# Patient Record
Sex: Female | Born: 1935 | Race: White | Hispanic: No | Smoking: Never smoker
Health system: Southern US, Community
[De-identification: ages and names within clinical notes are randomized; demographics above are authoritative.]

## PROBLEM LIST (undated history)

## (undated) DIAGNOSIS — M419 Scoliosis, unspecified: Secondary | ICD-10-CM

## (undated) DIAGNOSIS — E039 Hypothyroidism, unspecified: Secondary | ICD-10-CM

## (undated) DIAGNOSIS — J449 Chronic obstructive pulmonary disease, unspecified: Secondary | ICD-10-CM

## (undated) DIAGNOSIS — U071 COVID-19: Secondary | ICD-10-CM

## (undated) DIAGNOSIS — M810 Age-related osteoporosis without current pathological fracture: Secondary | ICD-10-CM

## (undated) DIAGNOSIS — K219 Gastro-esophageal reflux disease without esophagitis: Secondary | ICD-10-CM

## (undated) DIAGNOSIS — I4891 Unspecified atrial fibrillation: Secondary | ICD-10-CM

## (undated) DIAGNOSIS — I10 Essential (primary) hypertension: Secondary | ICD-10-CM

## (undated) DIAGNOSIS — H9190 Unspecified hearing loss, unspecified ear: Secondary | ICD-10-CM

## (undated) DIAGNOSIS — F419 Anxiety disorder, unspecified: Secondary | ICD-10-CM

## (undated) DIAGNOSIS — M1990 Unspecified osteoarthritis, unspecified site: Secondary | ICD-10-CM

## (undated) DIAGNOSIS — E079 Disorder of thyroid, unspecified: Secondary | ICD-10-CM

## (undated) DIAGNOSIS — E7800 Pure hypercholesterolemia, unspecified: Secondary | ICD-10-CM

## (undated) DIAGNOSIS — F0390 Unspecified dementia without behavioral disturbance: Secondary | ICD-10-CM

## (undated) HISTORY — DX: Hypothyroidism, unspecified: E03.9

## (undated) HISTORY — DX: COVID-19: U07.1

## (undated) HISTORY — DX: Unspecified atrial fibrillation: I48.91

## (undated) HISTORY — DX: Anxiety disorder, unspecified: F41.9

## (undated) HISTORY — PX: CATARACT EXTRACTION: SUR2

## (undated) HISTORY — DX: Scoliosis, unspecified: M41.9

## (undated) HISTORY — PX: OTHER SURGICAL HISTORY: SHX169

## (undated) HISTORY — DX: Unspecified osteoarthritis, unspecified site: M19.90

## (undated) HISTORY — PX: THYROIDECTOMY: SHX17

## (undated) HISTORY — DX: Disorder of thyroid, unspecified: E07.9

## (undated) HISTORY — DX: Unspecified hearing loss, unspecified ear: H91.90

---

## 1998-01-15 ENCOUNTER — Ambulatory Visit (HOSPITAL_COMMUNITY): Admission: RE | Admit: 1998-01-15 | Discharge: 1998-01-15 | Payer: Self-pay | Admitting: Family Medicine

## 1998-12-19 ENCOUNTER — Other Ambulatory Visit: Admission: RE | Admit: 1998-12-19 | Discharge: 1998-12-19 | Payer: Self-pay | Admitting: *Deleted

## 2000-01-21 ENCOUNTER — Encounter: Admission: RE | Admit: 2000-01-21 | Discharge: 2000-01-21 | Payer: Self-pay | Admitting: *Deleted

## 2000-01-21 ENCOUNTER — Encounter: Payer: Self-pay | Admitting: *Deleted

## 2000-02-04 ENCOUNTER — Encounter: Admission: RE | Admit: 2000-02-04 | Discharge: 2000-02-04 | Payer: Self-pay | Admitting: *Deleted

## 2000-02-04 ENCOUNTER — Encounter: Payer: Self-pay | Admitting: *Deleted

## 2000-04-27 ENCOUNTER — Encounter: Payer: Self-pay | Admitting: Emergency Medicine

## 2000-04-27 ENCOUNTER — Emergency Department (HOSPITAL_COMMUNITY): Admission: EM | Admit: 2000-04-27 | Discharge: 2000-04-27 | Payer: Self-pay | Admitting: Emergency Medicine

## 2000-10-19 ENCOUNTER — Encounter: Payer: Self-pay | Admitting: *Deleted

## 2000-10-19 ENCOUNTER — Ambulatory Visit (HOSPITAL_COMMUNITY): Admission: RE | Admit: 2000-10-19 | Discharge: 2000-10-19 | Payer: Self-pay | Admitting: *Deleted

## 2000-12-30 ENCOUNTER — Encounter: Admission: RE | Admit: 2000-12-30 | Discharge: 2000-12-30 | Payer: Self-pay | Admitting: *Deleted

## 2000-12-30 ENCOUNTER — Encounter: Payer: Self-pay | Admitting: *Deleted

## 2001-01-21 ENCOUNTER — Encounter: Admission: RE | Admit: 2001-01-21 | Discharge: 2001-01-21 | Payer: Self-pay | Admitting: *Deleted

## 2001-01-21 ENCOUNTER — Encounter: Payer: Self-pay | Admitting: *Deleted

## 2001-03-31 ENCOUNTER — Encounter: Payer: Self-pay | Admitting: *Deleted

## 2001-03-31 ENCOUNTER — Encounter: Admission: RE | Admit: 2001-03-31 | Discharge: 2001-03-31 | Payer: Self-pay | Admitting: *Deleted

## 2002-01-25 ENCOUNTER — Encounter: Admission: RE | Admit: 2002-01-25 | Discharge: 2002-01-25 | Payer: Self-pay | Admitting: Family Medicine

## 2002-01-25 ENCOUNTER — Encounter: Payer: Self-pay | Admitting: Family Medicine

## 2002-06-08 ENCOUNTER — Encounter: Payer: Self-pay | Admitting: Specialist

## 2002-06-13 ENCOUNTER — Ambulatory Visit (HOSPITAL_COMMUNITY): Admission: RE | Admit: 2002-06-13 | Discharge: 2002-06-13 | Payer: Self-pay | Admitting: Specialist

## 2002-07-06 ENCOUNTER — Ambulatory Visit (HOSPITAL_COMMUNITY): Admission: RE | Admit: 2002-07-06 | Discharge: 2002-07-06 | Payer: Self-pay | Admitting: Specialist

## 2002-10-31 ENCOUNTER — Encounter: Payer: Self-pay | Admitting: Family Medicine

## 2002-10-31 ENCOUNTER — Encounter: Admission: RE | Admit: 2002-10-31 | Discharge: 2002-10-31 | Payer: Self-pay | Admitting: Family Medicine

## 2003-07-18 ENCOUNTER — Ambulatory Visit (HOSPITAL_COMMUNITY): Admission: RE | Admit: 2003-07-18 | Discharge: 2003-07-18 | Payer: Self-pay | Admitting: Family Medicine

## 2003-07-24 ENCOUNTER — Encounter: Admission: RE | Admit: 2003-07-24 | Discharge: 2003-07-24 | Payer: Self-pay | Admitting: Family Medicine

## 2003-11-03 ENCOUNTER — Encounter: Admission: RE | Admit: 2003-11-03 | Discharge: 2003-11-03 | Payer: Self-pay | Admitting: Family Medicine

## 2004-11-08 ENCOUNTER — Encounter: Admission: RE | Admit: 2004-11-08 | Discharge: 2004-11-08 | Payer: Self-pay | Admitting: Family Medicine

## 2005-11-10 ENCOUNTER — Encounter: Admission: RE | Admit: 2005-11-10 | Discharge: 2005-11-10 | Payer: Self-pay | Admitting: Family Medicine

## 2006-11-11 ENCOUNTER — Encounter: Admission: RE | Admit: 2006-11-11 | Discharge: 2006-11-11 | Payer: Self-pay | Admitting: Family Medicine

## 2007-10-26 ENCOUNTER — Encounter: Payer: Self-pay | Admitting: Critical Care Medicine

## 2007-10-27 ENCOUNTER — Encounter: Payer: Self-pay | Admitting: Critical Care Medicine

## 2007-10-29 ENCOUNTER — Encounter: Payer: Self-pay | Admitting: Critical Care Medicine

## 2007-11-15 ENCOUNTER — Ambulatory Visit: Payer: Self-pay | Admitting: Cardiology

## 2007-11-15 ENCOUNTER — Ambulatory Visit: Payer: Self-pay | Admitting: Critical Care Medicine

## 2007-11-15 DIAGNOSIS — I10 Essential (primary) hypertension: Secondary | ICD-10-CM | POA: Insufficient documentation

## 2007-11-15 DIAGNOSIS — E785 Hyperlipidemia, unspecified: Secondary | ICD-10-CM

## 2007-11-15 LAB — CONVERTED CEMR LAB
Calcium: 9 mg/dL (ref 8.4–10.5)
Chloride: 95 meq/L — ABNORMAL LOW (ref 96–112)
Creatinine, Ser: 1.1 mg/dL (ref 0.4–1.2)
Potassium: 5.1 meq/L (ref 3.5–5.1)

## 2007-11-17 ENCOUNTER — Encounter: Payer: Self-pay | Admitting: Critical Care Medicine

## 2007-11-18 ENCOUNTER — Telehealth (INDEPENDENT_AMBULATORY_CARE_PROVIDER_SITE_OTHER): Payer: Self-pay | Admitting: *Deleted

## 2007-11-19 ENCOUNTER — Ambulatory Visit: Payer: Self-pay | Admitting: Critical Care Medicine

## 2007-11-22 ENCOUNTER — Encounter: Payer: Self-pay | Admitting: Critical Care Medicine

## 2007-11-24 ENCOUNTER — Ambulatory Visit: Payer: Self-pay | Admitting: Critical Care Medicine

## 2007-11-24 ENCOUNTER — Encounter: Payer: Self-pay | Admitting: Critical Care Medicine

## 2007-11-24 ENCOUNTER — Ambulatory Visit: Admission: RE | Admit: 2007-11-24 | Discharge: 2007-11-24 | Payer: Self-pay | Admitting: Critical Care Medicine

## 2007-11-26 ENCOUNTER — Encounter: Payer: Self-pay | Admitting: Critical Care Medicine

## 2007-11-26 ENCOUNTER — Telehealth (INDEPENDENT_AMBULATORY_CARE_PROVIDER_SITE_OTHER): Payer: Self-pay | Admitting: *Deleted

## 2007-11-28 ENCOUNTER — Encounter: Payer: Self-pay | Admitting: Critical Care Medicine

## 2007-12-06 ENCOUNTER — Ambulatory Visit: Payer: Self-pay | Admitting: Critical Care Medicine

## 2007-12-06 DIAGNOSIS — K219 Gastro-esophageal reflux disease without esophagitis: Secondary | ICD-10-CM | POA: Insufficient documentation

## 2007-12-08 ENCOUNTER — Encounter: Admission: RE | Admit: 2007-12-08 | Discharge: 2007-12-08 | Payer: Self-pay | Admitting: Family Medicine

## 2007-12-09 ENCOUNTER — Telehealth (INDEPENDENT_AMBULATORY_CARE_PROVIDER_SITE_OTHER): Payer: Self-pay | Admitting: *Deleted

## 2007-12-14 ENCOUNTER — Telehealth (INDEPENDENT_AMBULATORY_CARE_PROVIDER_SITE_OTHER): Payer: Self-pay | Admitting: *Deleted

## 2008-03-24 ENCOUNTER — Telehealth: Payer: Self-pay | Admitting: Pulmonary Disease

## 2008-05-03 ENCOUNTER — Telehealth (INDEPENDENT_AMBULATORY_CARE_PROVIDER_SITE_OTHER): Payer: Self-pay | Admitting: *Deleted

## 2008-05-03 ENCOUNTER — Ambulatory Visit: Payer: Self-pay | Admitting: Critical Care Medicine

## 2008-05-26 ENCOUNTER — Encounter: Admission: RE | Admit: 2008-05-26 | Discharge: 2008-05-26 | Payer: Self-pay | Admitting: Family Medicine

## 2008-07-18 ENCOUNTER — Ambulatory Visit (HOSPITAL_COMMUNITY): Admission: RE | Admit: 2008-07-18 | Discharge: 2008-07-18 | Payer: Self-pay | Admitting: Family Medicine

## 2008-12-08 ENCOUNTER — Encounter: Admission: RE | Admit: 2008-12-08 | Discharge: 2008-12-08 | Payer: Self-pay | Admitting: Family Medicine

## 2009-01-02 ENCOUNTER — Ambulatory Visit: Payer: Self-pay | Admitting: Critical Care Medicine

## 2009-05-07 ENCOUNTER — Ambulatory Visit: Payer: Self-pay | Admitting: Critical Care Medicine

## 2009-08-01 ENCOUNTER — Telehealth (INDEPENDENT_AMBULATORY_CARE_PROVIDER_SITE_OTHER): Payer: Self-pay | Admitting: *Deleted

## 2009-08-13 ENCOUNTER — Ambulatory Visit (HOSPITAL_COMMUNITY): Admission: RE | Admit: 2009-08-13 | Discharge: 2009-08-13 | Payer: Self-pay | Admitting: Family Medicine

## 2009-08-24 ENCOUNTER — Ambulatory Visit: Payer: Self-pay | Admitting: Critical Care Medicine

## 2009-12-10 ENCOUNTER — Encounter: Admission: RE | Admit: 2009-12-10 | Discharge: 2009-12-10 | Payer: Self-pay | Admitting: Family Medicine

## 2009-12-17 ENCOUNTER — Encounter: Admission: RE | Admit: 2009-12-17 | Discharge: 2009-12-17 | Payer: Self-pay | Admitting: Family Medicine

## 2010-08-06 ENCOUNTER — Encounter: Admission: RE | Admit: 2010-08-06 | Discharge: 2010-08-06 | Payer: Self-pay | Admitting: Family Medicine

## 2010-08-30 ENCOUNTER — Ambulatory Visit (HOSPITAL_COMMUNITY)
Admission: RE | Admit: 2010-08-30 | Discharge: 2010-08-30 | Payer: Self-pay | Source: Home / Self Care | Attending: Family Medicine | Admitting: Family Medicine

## 2010-10-13 ENCOUNTER — Encounter: Payer: Self-pay | Admitting: Family Medicine

## 2010-11-12 ENCOUNTER — Other Ambulatory Visit: Payer: Self-pay | Admitting: Family Medicine

## 2010-11-12 DIAGNOSIS — Z1231 Encounter for screening mammogram for malignant neoplasm of breast: Secondary | ICD-10-CM

## 2010-12-12 ENCOUNTER — Ambulatory Visit
Admission: RE | Admit: 2010-12-12 | Discharge: 2010-12-12 | Disposition: A | Discharge status: Home / Self Care | Payer: Medicare Other | Source: Ambulatory Visit | Attending: Family Medicine | Admitting: Family Medicine

## 2010-12-12 DIAGNOSIS — Z1231 Encounter for screening mammogram for malignant neoplasm of breast: Secondary | ICD-10-CM

## 2011-02-04 NOTE — Op Note (Signed)
NAMECHRISA, Erin Ryan               ACCOUNT NO.:  [id number]   MEDICAL RECORD NO.:  [id number]          PATIENT TYPE:  AMB   LOCATION:  CARD                         FACILITY:  [facility]   PHYSICIAN:  Charlcie Cradle. Delford Field, MD, FCCPDATE OF BIRTH:  12-15-26   DATE OF PROCEDURE:  11/24/2007  DATE OF DISCHARGE:                               OPERATIVE REPORT   CHIEF COMPLAINT:  Dyspnea with interstitial disease on CT scanning,  evaluate for cause.   SURGEON:  Charlcie Cradle. Delford Field, MD, FCCP.   ANESTHESIA:  1% Xylocaine local.   PREMEDICATION:  Fentanyl 20 mcg, Versed 4 mg IV push.   PROCEDURE:  The Pentax video bronchoscope was introduced via the right  nares.  The upper airways were visualized and showed significant airway  edema with persistent false vocal cord edema and arachnoid swelling  compatible with reflux induced inflammation.  Attention was then paid to  the lower airway.  This showed mild tracheobronchitis, no endobronchial  lesions were seen.  Attention was then paid to the right upper lobe  orifice.  Transbronchial biopsies were obtained.  Bronchial washings  were obtained from the apical and anterior segments.   COMPLICATIONS:  None.   IMPRESSION:  Interstitial disease compatible with nonspecific  interstitial pneumonitis, reflux induced upper airway dysfunction and  obstruction.   RECOMMENDATIONS:  Treat reflux disease more aggressively, consider  systemic corticosteroids for airway disease, follow up microbiologic  data, obtain ANA rheumatoid factor.      Charlcie Cradle Delford Field, MD, [facility]  Electronically Signed     PEW/MEDQ  D:  11/24/2007  T:  11/24/2007  Job:  [id number]   cc:   Stacie Acres. Cliffton Asters, M.D.  Fax: [phone]

## 2011-06-16 LAB — FUNGUS CULTURE W SMEAR

## 2011-06-16 LAB — CULTURE, RESPIRATORY W GRAM STAIN

## 2011-06-16 LAB — AFB CULTURE WITH SMEAR (NOT AT ARMC)

## 2011-06-16 LAB — ANA: Anti Nuclear Antibody(ANA): NEGATIVE

## 2011-06-24 ENCOUNTER — Other Ambulatory Visit: Payer: Self-pay | Admitting: Gastroenterology

## 2011-09-03 ENCOUNTER — Other Ambulatory Visit: Payer: Self-pay | Admitting: Family Medicine

## 2011-09-04 ENCOUNTER — Other Ambulatory Visit (HOSPITAL_COMMUNITY): Payer: Self-pay | Admitting: *Deleted

## 2011-09-05 ENCOUNTER — Other Ambulatory Visit (HOSPITAL_COMMUNITY): Payer: Self-pay | Admitting: *Deleted

## 2011-09-08 ENCOUNTER — Encounter (HOSPITAL_COMMUNITY): Payer: Self-pay

## 2011-09-08 ENCOUNTER — Encounter (HOSPITAL_COMMUNITY)
Admission: RE | Admit: 2011-09-08 | Discharge: 2011-09-08 | Disposition: A | Discharge status: Home / Self Care | Payer: Medicare Other | Source: Ambulatory Visit | Attending: Family Medicine | Admitting: Family Medicine

## 2011-09-08 DIAGNOSIS — M810 Age-related osteoporosis without current pathological fracture: Secondary | ICD-10-CM | POA: Insufficient documentation

## 2011-09-08 HISTORY — DX: Gastro-esophageal reflux disease without esophagitis: K21.9

## 2011-09-08 HISTORY — DX: Pure hypercholesterolemia, unspecified: E78.00

## 2011-09-08 HISTORY — DX: Essential (primary) hypertension: I10

## 2011-09-08 MED ORDER — SODIUM CHLORIDE 0.9 % IV SOLN
INTRAVENOUS | Status: DC
  Administered 2011-09-08: 14:00:00 via INTRAVENOUS

## 2011-09-08 MED ORDER — ZOLEDRONIC ACID 5 MG/100ML IV SOLN
5.0000 mg | INTRAVENOUS | Status: AC
  Administered 2011-09-08: 5 mg via INTRAVENOUS
  Filled 2011-09-08: qty 100

## 2011-09-25 DIAGNOSIS — N6459 Other signs and symptoms in breast: Secondary | ICD-10-CM | POA: Diagnosis not present

## 2011-09-25 DIAGNOSIS — N644 Mastodynia: Secondary | ICD-10-CM | POA: Diagnosis not present

## 2011-09-26 ENCOUNTER — Other Ambulatory Visit: Payer: Self-pay | Admitting: Family Medicine

## 2011-09-26 DIAGNOSIS — N644 Mastodynia: Secondary | ICD-10-CM

## 2011-09-26 DIAGNOSIS — N6459 Other signs and symptoms in breast: Secondary | ICD-10-CM

## 2011-10-02 ENCOUNTER — Ambulatory Visit
Admission: RE | Admit: 2011-10-02 | Discharge: 2011-10-02 | Disposition: A | Discharge status: Home / Self Care | Payer: Medicare Other | Source: Ambulatory Visit | Attending: Family Medicine | Admitting: Family Medicine

## 2011-10-02 DIAGNOSIS — N6489 Other specified disorders of breast: Secondary | ICD-10-CM | POA: Diagnosis not present

## 2011-10-02 DIAGNOSIS — N6459 Other signs and symptoms in breast: Secondary | ICD-10-CM

## 2011-10-02 DIAGNOSIS — N644 Mastodynia: Secondary | ICD-10-CM

## 2011-10-17 DIAGNOSIS — L988 Other specified disorders of the skin and subcutaneous tissue: Secondary | ICD-10-CM | POA: Diagnosis not present

## 2011-10-21 ENCOUNTER — Other Ambulatory Visit: Payer: Self-pay | Admitting: Family Medicine

## 2011-10-21 DIAGNOSIS — Z1231 Encounter for screening mammogram for malignant neoplasm of breast: Secondary | ICD-10-CM

## 2011-10-22 DIAGNOSIS — L988 Other specified disorders of the skin and subcutaneous tissue: Secondary | ICD-10-CM | POA: Diagnosis not present

## 2011-10-22 DIAGNOSIS — L821 Other seborrheic keratosis: Secondary | ICD-10-CM | POA: Diagnosis not present

## 2011-11-04 DIAGNOSIS — R0602 Shortness of breath: Secondary | ICD-10-CM | POA: Diagnosis not present

## 2011-11-04 DIAGNOSIS — E785 Hyperlipidemia, unspecified: Secondary | ICD-10-CM | POA: Diagnosis not present

## 2011-11-04 DIAGNOSIS — Z1331 Encounter for screening for depression: Secondary | ICD-10-CM | POA: Diagnosis not present

## 2011-11-04 DIAGNOSIS — E039 Hypothyroidism, unspecified: Secondary | ICD-10-CM | POA: Diagnosis not present

## 2011-11-04 DIAGNOSIS — K219 Gastro-esophageal reflux disease without esophagitis: Secondary | ICD-10-CM | POA: Diagnosis not present

## 2011-11-04 DIAGNOSIS — I129 Hypertensive chronic kidney disease with stage 1 through stage 4 chronic kidney disease, or unspecified chronic kidney disease: Secondary | ICD-10-CM | POA: Diagnosis not present

## 2011-11-04 DIAGNOSIS — F411 Generalized anxiety disorder: Secondary | ICD-10-CM | POA: Diagnosis not present

## 2011-11-17 DIAGNOSIS — H9319 Tinnitus, unspecified ear: Secondary | ICD-10-CM | POA: Diagnosis not present

## 2011-11-17 DIAGNOSIS — H903 Sensorineural hearing loss, bilateral: Secondary | ICD-10-CM | POA: Diagnosis not present

## 2011-12-19 ENCOUNTER — Ambulatory Visit
Admission: RE | Admit: 2011-12-19 | Discharge: 2011-12-19 | Disposition: A | Discharge status: Home / Self Care | Payer: Medicare Other | Source: Ambulatory Visit | Attending: Family Medicine | Admitting: Family Medicine

## 2011-12-19 ENCOUNTER — Other Ambulatory Visit: Payer: Self-pay | Admitting: Family Medicine

## 2011-12-19 DIAGNOSIS — Z1231 Encounter for screening mammogram for malignant neoplasm of breast: Secondary | ICD-10-CM

## 2011-12-19 DIAGNOSIS — N6459 Other signs and symptoms in breast: Secondary | ICD-10-CM

## 2011-12-30 ENCOUNTER — Ambulatory Visit
Admission: RE | Admit: 2011-12-30 | Discharge: 2011-12-30 | Disposition: A | Discharge status: Home / Self Care | Payer: Medicare Other | Source: Ambulatory Visit | Attending: Family Medicine | Admitting: Family Medicine

## 2011-12-30 DIAGNOSIS — N6489 Other specified disorders of breast: Secondary | ICD-10-CM | POA: Diagnosis not present

## 2011-12-30 DIAGNOSIS — N6459 Other signs and symptoms in breast: Secondary | ICD-10-CM

## 2012-01-02 ENCOUNTER — Ambulatory Visit (INDEPENDENT_AMBULATORY_CARE_PROVIDER_SITE_OTHER): Payer: Medicare Other | Admitting: Surgery

## 2012-01-02 ENCOUNTER — Encounter (INDEPENDENT_AMBULATORY_CARE_PROVIDER_SITE_OTHER): Payer: Self-pay | Admitting: Surgery

## 2012-01-02 VITALS — BP 164/72 | HR 68 | Temp 97.4°F | Ht 62.5 in | Wt <= 1120 oz

## 2012-01-02 DIAGNOSIS — N61 Mastitis without abscess: Secondary | ICD-10-CM | POA: Diagnosis not present

## 2012-01-02 DIAGNOSIS — N6459 Other signs and symptoms in breast: Secondary | ICD-10-CM

## 2012-01-02 NOTE — Progress Notes (Signed)
Re:   Erin Ryan DOB:   [birth year] MRN:   [id number]  Urgent Office  ASSESSMENT AND PLAN: 1.  Left nipple skin cyst/blister - 2 days - unclear reason.  2.  Dr. Chilton Si has raised the questions of Paget's disease, though my exam is unimpressive.  (photo taken)  I will see back in 3 to 4 weeks when the blister has healed to re-evaluate nipple/breast.  Need to set her up in one of the minor surgery rooms for possible punch biopsy.  3.  Hypertension 4.  GERD  Saw Dr. Jonni Sanger - who described pulmonary fibrosis 4.  Arthritis  Chief Complaint  Patient presents with  . Follow-up    eval Lt nipple punch bx   REFERRING PHYSICIAN: Cala Bradford, MD, MD  HISTORY OF PRESENT ILLNESS: Erin Ryan is a 78 y.o. (DOB: [birth year])  white female whose primary care physician is Cala Bradford, MD, MD and comes to me today for left nipple cyst/blister.  The patient has had prior bilateral breast biopsies by Dr. Rebekah Chesterfield and Dr. Verne Carrow many years ago. There is a circumareolar incision around the left nipple.  She noticed some changes of her left nipple about 3 months ago. She brought this to the attention of Dr. Charlott Rakes. She obtained a mammogram of the left breast 02 October 2011 which was negative.  She came back Monday, 30 December 2011, for bilateral mammograms. They insisted on doing diagnostic mammograms. The change in the left nipple was not much different according to the patient. However, 2 days post mammogram she developed a blister on her left nipple. This prompted her to be worked in to the urgent office.  She has no family history of breast cancer.    Past Medical History  Diagnosis Date  . Osteoporosis   . Hypertension   . High cholesterol   . GERD (gastroesophageal reflux disease)   . Arthritis   . Thyroid disease       Past Surgical History  Procedure Date  . Thyroidectomy   . Cysts removed from bilateral breasts       Current Outpatient Prescriptions    Medication Sig Dispense Refill  . amLODipine (NORVASC) 5 MG tablet Take 5 mg by mouth daily.        Marland Kitchen atorvastatin (LIPITOR) 40 MG tablet Take 40 mg by mouth daily.        . calcium carbonate (OS-CAL - DOSED IN MG OF ELEMENTAL CALCIUM) 1250 MG tablet Take 1 tablet by mouth daily.        Marland Kitchen levothyroxine (SYNTHROID, LEVOTHROID) 75 MCG tablet Take 75 mcg by mouth daily.        Marland Kitchen olmesartan (BENICAR) 40 MG tablet Take 40 mg by mouth daily.        Marland Kitchen omeprazole (PRILOSEC) 20 MG capsule Take 20 mg by mouth daily.        Marland Kitchen PARoxetine (PAXIL) 10 MG tablet Take 10 mg by mouth every morning.        . Zoledronic Acid (RECLAST IV) Inject into the vein once.         No Known Allergies  REVIEW OF SYSTEMS: Skin:  No history of rash.  No history of abnormal moles. Infection:  No history of hepatitis or HIV.  No history of MRSA. Neurologic:  No history of stroke.  No history of seizure.  No history of headaches. Cardiac:  Hypertension Pulmonary: Saw Dr. Delford Field.  ?reflux induced pulmonary disease.  Endocrine:  No  diabetes. Prior thyroid surgery for benign disease. Gastrointestinal:  No history of stomach disease.  No history of liver disease.  No history of gall bladder disease.  No history of pancreas disease.  No history of colon disease. Urologic:  No history of kidney stones.  No history of bladder infections. Musculoskeletal:  No history of joint or back disease. Hematologic:  No bleeding disorder.  No history of anemia.  Not anticoagulated. Psycho-social:  The patient is oriented.   The patient has no obvious psychologic or social impairment to understanding our conversation and plan.  SOCIAL and FAMILY HISTORY: Daughter, [name], is with patient.  Only child. She just had a colectomy by Dr. Michaell Cowing.  PHYSICAL EXAM: BP 164/72  Pulse 68  Temp(Src) 97.4 F (36.3 C) (Temporal)  Ht 5' 2.5" (1.588 m)  Wt 11 lb 9.6 oz (5.262 kg)  BMI 2.09 kg/m2  SpO2 96%  General: WN older WF who is alert  and generally healthy appearing. Somewhat hard of hearing. HEENT: Normal. Pupils equal. Good dentition. Neck: Supple. No mass.  Thyroidectomy scar. Lymph Nodes:  No supraclavicular or cervical nodes. Lungs: Clear to auscultation and symmetric breath sounds. Breasts:  Left:  1.5 cm blister on nipple.  It is a little hard to evaluate the aerola.  There is no obvious Paget's disease.  [photo taken]  Right: Scars at 12 and 9 o'clock.  Heart:  RRR. No murmur or rub. Extremities:  Good strength and ROM  in upper and lower extremities. Neurologic:  Grossly intact to motor and sensory function. Psychiatric: Has normal mood and affect. Behavior is normal.   DATA REVIEWED: Mammogram reports.  Ovidio Kin, MD,  [facility] [address] [place]  [address]   [place], [place]    [address] Phone:  [phone] FAX:  [phone]

## 2012-01-06 DIAGNOSIS — L259 Unspecified contact dermatitis, unspecified cause: Secondary | ICD-10-CM | POA: Diagnosis not present

## 2012-01-20 ENCOUNTER — Ambulatory Visit (INDEPENDENT_AMBULATORY_CARE_PROVIDER_SITE_OTHER): Payer: Medicare Other | Admitting: Surgery

## 2012-01-29 ENCOUNTER — Ambulatory Visit (INDEPENDENT_AMBULATORY_CARE_PROVIDER_SITE_OTHER): Payer: Medicare Other

## 2012-01-29 ENCOUNTER — Ambulatory Visit (INDEPENDENT_AMBULATORY_CARE_PROVIDER_SITE_OTHER): Payer: Medicare Other | Admitting: Surgery

## 2012-01-29 DIAGNOSIS — N6459 Other signs and symptoms in breast: Secondary | ICD-10-CM

## 2012-01-29 NOTE — Progress Notes (Signed)
Re:   Erin Ryan DOB:   04-Jan-1967 MRN:   [id number]  Urgent Office  ASSESSMENT AND PLAN: 1.  Left nipple skin cyst/blister.  The nipple looks much better than last time.  The blister is gone, but there is some thickening.  I will see her back in one month to recheck the nipple.  She will be in room #11 if I need to do a biopsy.   2.  Dr. Chilton Ryan has raised the questions of Paget's disease, though my exam is unimpressive.  (photo taken)   3.  Hypertension 4.  GERD  Saw Dr. Jonni Ryan - who described pulmonary fibrosis 4.  Arthritis  REFERRING PHYSICIAN: Cala Bradford, MD, MD  HISTORY OF PRESENT ILLNESS: Erin Ryan is a 78 y.o. (DOB: 15-Mar-1925)  white female whose primary care physician is Erin Bradford, MD, MD and comes to me today for left nipple cyst/blister.  Her daughter is here with her.  The patient has had prior bilateral breast biopsies by Dr. Rebekah Ryan and Dr. Verne Ryan many years ago. There is a circumareolar incision around the left nipple.  She noticed some changes of her left nipple about 3 months ago. She brought this to the attention of Dr. Charlott Ryan. She obtained a mammogram of the left breast 02 October 2011 which was negative.  She came back Monday, 30 December 2011, for bilateral mammograms. They insisted on doing diagnostic mammograms. The change in the left nipple was not much different according to the patient. However, 2 days post mammogram she developed a blister on her left nipple. This prompted her to be worked in to the urgent office.  Since last saw her, the blister on the left nipple has healed.  She is having no pain or symptoms.  She has no family history of breast cancer.    Past Medical History  Diagnosis Date  . Osteoporosis   . Hypertension   . High cholesterol   . GERD (gastroesophageal reflux disease)   . Arthritis   . Thyroid disease      Current Outpatient Prescriptions  Medication Sig Dispense Refill  . amLODipine  (NORVASC) 5 MG tablet Take 5 mg by mouth daily.        Marland Kitchen atorvastatin (LIPITOR) 40 MG tablet Take 40 mg by mouth daily.        . calcium carbonate (OS-CAL - DOSED IN MG OF ELEMENTAL CALCIUM) 1250 MG tablet Take 1 tablet by mouth daily.        Marland Kitchen levothyroxine (SYNTHROID, LEVOTHROID) 75 MCG tablet Take 75 mcg by mouth daily.        Marland Kitchen olmesartan (BENICAR) 40 MG tablet Take 40 mg by mouth daily.        Marland Kitchen omeprazole (PRILOSEC) 20 MG capsule Take 20 mg by mouth daily.        Marland Kitchen PARoxetine (PAXIL) 10 MG tablet Take 10 mg by mouth every morning.        . Zoledronic Acid (RECLAST IV) Inject into the vein once.         No Known Allergies  REVIEW OF SYSTEMS: Cardiac:  Hypertension Pulmonary: Saw Dr. Delford Ryan.  ?reflux induced pulmonary disease. Endocrine:  No diabetes. Prior thyroid surgery for benign disease.  SOCIAL and FAMILY HISTORY: Daughter, Erin Ryan, is with patient.  Only child. She just had a colectomy by Dr. Michaell Ryan.  PHYSICAL EXAM: There were no vitals taken for this visit.  General: WN older WF who is alert and generally healthy  appearing. Somewhat hard of hearing.  Breasts:  Left:  Thickened skin around left nipple from 9 o'clock to 2 o'clock.  The left nipple is inverted, but she does not know how long it has been that way.  I feel no mass or nodule under the nipple.  I is clearly better than when I last saw her.  [photo taken]  Right: Scars at 12 and 9 o'clock.  DATA REVIEWED: Mammogram reports.  Erin Kin, MD,  [facility] [address]  [address]   [place], [place]    [address] Phone:  [phone] FAX:  [phone]

## 2012-02-12 DIAGNOSIS — N899 Noninflammatory disorder of vagina, unspecified: Secondary | ICD-10-CM | POA: Diagnosis not present

## 2012-02-12 DIAGNOSIS — N6459 Other signs and symptoms in breast: Secondary | ICD-10-CM | POA: Diagnosis not present

## 2012-02-12 DIAGNOSIS — R300 Dysuria: Secondary | ICD-10-CM | POA: Diagnosis not present

## 2012-02-25 ENCOUNTER — Ambulatory Visit (INDEPENDENT_AMBULATORY_CARE_PROVIDER_SITE_OTHER): Payer: Medicare Other | Admitting: Surgery

## 2012-02-25 ENCOUNTER — Ambulatory Visit (INDEPENDENT_AMBULATORY_CARE_PROVIDER_SITE_OTHER): Payer: Medicare Other

## 2012-02-25 VITALS — BP 140/80 | HR 78 | Temp 97.2°F | Resp 18 | Wt <= 1120 oz

## 2012-02-25 DIAGNOSIS — N6459 Other signs and symptoms in breast: Secondary | ICD-10-CM | POA: Diagnosis not present

## 2012-02-25 DIAGNOSIS — L089 Local infection of the skin and subcutaneous tissue, unspecified: Secondary | ICD-10-CM | POA: Diagnosis not present

## 2012-02-25 NOTE — Progress Notes (Signed)
Re:   Erin Ryan DOB:   Jan 26, 1925 MRN:   [id number]  Urgent Office Patient  ASSESSMENT AND PLAN: 1.  Left nipple skin cyst/blister.  I first saw her 01/02/2012 with a blister of the left nipple/areola.  I did a punch/open biopsy of the skin of the areola today.  We will plan suture removal in one week.   2.  Dr. Chilton Si has raised the questions of Paget's disease, though my exam is unimpressive.  (photo taken)   3.  Hypertension 4.  GERD  Saw Dr. Jonni Sanger - who described pulmonary fibrosis 4.  Arthritis  REFERRING PHYSICIAN: Cala Bradford, MD, MD  HISTORY OF PRESENT ILLNESS: Erin Ryan is a 78 y.o. (DOB: 1925/05/28)  white female whose primary care physician is Cala Bradford, MD, MD and comes to me today for follow up of left nipple cyst/blister.  Her daughter is not here with her, [name]: (W) [phone]) but I talked to her on the phone.  The patient has had prior bilateral breast biopsies by Dr. Rebekah Chesterfield and Dr. Verne Carrow many years ago. There is a circumareolar incision around the left nipple.  She noticed some changes of her left nipple about 3 months ago. She brought this to the attention of Dr. Charlott Rakes. She obtained a mammogram of the left breast 02 October 2011 which was negative.  She came back Monday, 30 December 2011, for bilateral mammograms. They insisted on doing diagnostic mammograms. The change in the left nipple was not much different according to the patient. However, 2 days post mammogram she developed a blister on her left nipple. This prompted her to be worked in to the urgent office.  It has now been about 8 weeks since I first saw her.  The area is not entirely normal, so I went ahead with a biopsy of the left areola today.  She is having no pain or symptoms.  She has no family history of breast cancer.    Past Medical History  Diagnosis Date  . Osteoporosis   . Hypertension   . High cholesterol   . GERD (gastroesophageal reflux  disease)   . Arthritis   . Thyroid disease      Current Outpatient Prescriptions  Medication Sig Dispense Refill  . amLODipine (NORVASC) 5 MG tablet Take 5 mg by mouth daily.        Marland Kitchen atorvastatin (LIPITOR) 40 MG tablet Take 40 mg by mouth daily.        . calcium carbonate (OS-CAL - DOSED IN MG OF ELEMENTAL CALCIUM) 1250 MG tablet Take 1 tablet by mouth daily.        Marland Kitchen levothyroxine (SYNTHROID, LEVOTHROID) 75 MCG tablet Take 75 mcg by mouth daily.        Marland Kitchen olmesartan (BENICAR) 40 MG tablet Take 40 mg by mouth daily.        Marland Kitchen omeprazole (PRILOSEC) 20 MG capsule Take 20 mg by mouth daily.        Marland Kitchen PARoxetine (PAXIL) 10 MG tablet Take 10 mg by mouth every morning.        . Zoledronic Acid (RECLAST IV) Inject into the vein once.         No Known Allergies  REVIEW OF SYSTEMS: Cardiac:  Hypertension Pulmonary: Saw Dr. Delford Field.  ?reflux induced pulmonary disease. Endocrine:  No diabetes. Prior thyroid surgery for benign disease.  SOCIAL and FAMILY HISTORY: Daughter, [name] (W: [phone]) (works as a Armed forces operational officer), is with patient.  Only child. She just had a colectomy by Dr. Michaell Cowing.  PHYSICAL EXAM: BP 140/80  Pulse 78  Temp(Src) 97.2 F (36.2 C) (Temporal)  Resp 18  Wt 11 lb 6 oz (5.16 kg)  General: WN older WF who is alert and generally healthy appearing. Somewhat hard of hearing.  Breasts:  Left:  Thickened skin around left nipple from 9 o'clock to 2 o'clock.  The left nipple is inverted.  I feel no mass or nodule under the nipple. It is about the same as last time, with maybe some redness around the areola.  [photo taken]  Right: Scars at 12 and 9 o'clock.  Procedure:  While in the office, I painted her left areola with betadine.  I used a 3 mm punch biopsy to get a biopsy of skin.  But the punch did not work quite like I wanted to, so excise a 5 mm area of skin.  This was sent to path.  DATA REVIEWED: Mammogram reports.  Ovidio Kin, MD,  [facility] [address] [place]  [address]   [place], [place]    [address] Phone:  [phone] FAX:  [phone]

## 2012-02-26 ENCOUNTER — Telehealth (INDEPENDENT_AMBULATORY_CARE_PROVIDER_SITE_OTHER): Payer: Self-pay

## 2012-02-26 NOTE — Telephone Encounter (Signed)
I called the patient again to check on her.  She states it is not oozing now.  She plans to keep a gauze in her bra.  She has already spoken to Dr Ezzard Standing and got her path results.  He told her not to bathe until tomorrow and to hold her Aspirin this week.

## 2012-02-26 NOTE — Telephone Encounter (Signed)
The patient called and complained of oozing from her biopsy site this am.  She had just taken the bandage off.  I told her to apply a gauze pressure dressing and hold it for about 30 minutes.  I will call her back to check on it.

## 2012-02-26 NOTE — Telephone Encounter (Signed)
I called back and the patient says there is still a little oozing.  She does not have any more gauze.  I told her to get some and put a good thick dressing on it and keep pressure.  I will check on her around 1 or 1:30 today to see if we need to check her today.  She is going out of town tomorrow.

## 2012-02-27 ENCOUNTER — Ambulatory Visit (INDEPENDENT_AMBULATORY_CARE_PROVIDER_SITE_OTHER): Payer: Medicare Other

## 2012-02-27 ENCOUNTER — Ambulatory Visit (INDEPENDENT_AMBULATORY_CARE_PROVIDER_SITE_OTHER): Payer: Medicare Other | Admitting: Surgery

## 2012-03-02 DIAGNOSIS — L940 Localized scleroderma [morphea]: Secondary | ICD-10-CM | POA: Diagnosis not present

## 2012-03-03 ENCOUNTER — Ambulatory Visit (INDEPENDENT_AMBULATORY_CARE_PROVIDER_SITE_OTHER): Payer: Medicare Other

## 2012-03-03 DIAGNOSIS — Z4802 Encounter for removal of sutures: Secondary | ICD-10-CM

## 2012-03-03 NOTE — Patient Instructions (Addendum)
Call us with any concerns with your breast and incision

## 2012-03-03 NOTE — Progress Notes (Signed)
The patient came in for suture removal after breast biopsy.  Her incision had healed.  I removed the one suture.  I did not apply a bandage because it didn't look like it was going to bleed.  I gave a copy of her pathology.  I left her follow up open until I make sure Dr Ezzard Standing doesn't need to see her back.

## 2012-03-15 ENCOUNTER — Ambulatory Visit (INDEPENDENT_AMBULATORY_CARE_PROVIDER_SITE_OTHER): Payer: Medicare Other | Admitting: Surgery

## 2012-03-15 ENCOUNTER — Encounter (INDEPENDENT_AMBULATORY_CARE_PROVIDER_SITE_OTHER): Payer: Self-pay | Admitting: Surgery

## 2012-03-15 VITALS — BP 146/58 | HR 89 | Temp 97.7°F | Ht 62.5 in | Wt 113.2 lb

## 2012-03-15 DIAGNOSIS — N6459 Other signs and symptoms in breast: Secondary | ICD-10-CM | POA: Diagnosis not present

## 2012-03-15 MED ORDER — CEPHALEXIN 500 MG PO CAPS: 500.0000 mg | ORAL_CAPSULE | Freq: Two times a day (BID) | ORAL | Status: AC

## 2012-03-15 NOTE — Patient Instructions (Signed)
Take antibiotic for 1 week

## 2012-03-15 NOTE — Progress Notes (Signed)
URGENT Office Adel Neyer 78 y.o.  Body mass index is 20.37 kg/(m^2).  Patient Active Problem List  Diagnosis  . HYPERLIPIDEMIA  . HYPERTENSION  . GERD  . Nipple symptom or sign in female, left    No Known Allergies  Past Surgical History  Procedure Date  . Thyroidectomy   . Cysts removed from bilateral breasts   . Breast surgery    WHITE,CYNTHIA S, MD No diagnosis found.  Seen in urgent office for pain. She has a little blister but no pain to examination. The circumareolar area is red and I agree with  Dr. Ezzard Standing about concern for Paget's disease.  She is having a little left axillary tenderness and on the chance that this is infection, I am prescribing Keflex 500 BID and 1 week and followup with Dr. Ezzard Standing in 2 weeks.  Matt B. Daphine Deutscher, MD, [facility] [phone] beeper [phone]  03/15/2012 3:41 PM

## 2012-03-31 ENCOUNTER — Encounter (INDEPENDENT_AMBULATORY_CARE_PROVIDER_SITE_OTHER): Payer: Self-pay | Admitting: Surgery

## 2012-03-31 ENCOUNTER — Ambulatory Visit (INDEPENDENT_AMBULATORY_CARE_PROVIDER_SITE_OTHER): Payer: Medicare Other | Admitting: Surgery

## 2012-03-31 VITALS — BP 120/70 | HR 90 | Temp 97.0°F | Resp 18 | Ht 62.5 in | Wt 111.0 lb

## 2012-03-31 DIAGNOSIS — N6459 Other signs and symptoms in breast: Secondary | ICD-10-CM

## 2012-03-31 NOTE — Progress Notes (Signed)
Re:   Erin Ryan DOB:   [birth year] MRN:   [id number]  Urgent Office Patient  ASSESSMENT AND PLAN: 1.  Left nipple skin cyst/blister.  I first saw her 01/02/2012 with a blister of the left nipple/areola.  I did a punch/open biopsy of the skin of the areola 02/25/2012 - this was benign.  But she had more redness and a blister around the left nipple and saw Dr. Daphine Deutscher on 03/15/2012.  Resolving redness around the left nipple.  I will see her back in 8 weeks.  She is heading to [facility] in September.  I told her to go and I would see her when she got back.   2.  Dr. Chilton Si has raised the questions of Paget'Ryan disease, though the biopsy was negative - 02/25/2012.   3.  Hypertension 4.  GERD  Saw Dr. Jonni Sanger - who described pulmonary fibrosis 4.  Arthritis  REFERRING PHYSICIAN: Cala Bradford, MD  HISTORY OF PRESENT ILLNESS: Erin Ryan is a 78 y.o. (DOB: 26-Oct-1924)  white female whose primary care physician is Erin S, MD and comes to me today for follow up of left nipple cyst/blister.  Her daughter is not here with her, Erin Ryan: [phone]) but I talked to her on the phone.  The patient has had prior bilateral breast biopsies by Dr. Rebekah Chesterfield and Dr. Verne Carrow many years ago. There is a circumareolar incision around the left nipple.  She noticed some changes of her left nipple around Dec 2012. She brought this to the attention of Dr. Charlott Rakes. She obtained a mammogram of the left breast 02 October 2011 which was negative.  I did a biopsy of the left nipple on 02/25/2012 which was negative.  She then [facility]15/2012.  She thinks that things are better since she saw Dr. Daphine Deutscher.    Past Medical History  Diagnosis Date  . Osteoporosis   . Hypertension   . High cholesterol   . GERD (gastroesophageal reflux disease)   . Arthritis   . Thyroid disease      Current Outpatient Prescriptions  Medication Sig Dispense Refill  .  amLODipine (NORVASC) 5 MG tablet Take 5 mg by mouth daily.        Marland Kitchen aspirin 81 MG tablet Take 81 mg by mouth daily.      Marland Kitchen atorvastatin (LIPITOR) 40 MG tablet Take 40 mg by mouth daily.        . calcium carbonate (OS-CAL - DOSED IN MG OF ELEMENTAL CALCIUM) 1250 MG tablet Take 1 tablet by mouth daily.        Marland Kitchen levothyroxine (SYNTHROID, LEVOTHROID) 75 MCG tablet Take 75 mcg by mouth daily.        Marland Kitchen olmesartan (BENICAR) 40 MG tablet Take 40 mg by mouth daily.        Marland Kitchen omeprazole (PRILOSEC) 20 MG capsule Take 20 mg by mouth daily.        Marland Kitchen PARoxetine (PAXIL) 10 MG tablet Take 10 mg by mouth every morning.        . vitamin E 100 UNIT capsule Take 100 Units by mouth daily.      . Zoledronic Acid (RECLAST IV) Inject into the vein once.         No Known Allergies  REVIEW OF SYSTEMS: Cardiac:  Hypertension Pulmonary: Saw Dr. Delford Field.  ?reflux induced pulmonary disease. Endocrine:  No diabetes. Prior thyroid surgery for benign disease.  SOCIAL and  FAMILY HISTORY: Daughter, Erin Ryan (W: [phone]) (works as a Armed forces operational officer), is with patient.  Only child. She just had a colectomy by Dr. Michaell Cowing.  PHYSICAL EXAM: BP 120/70  Pulse 90  Temp 97 F (36.1 C) (Temporal)  Resp 18  Ht 5' 2.5" (1.588 m)  Wt 111 lb (50.349 kg)  BMI 19.98 kg/m2  General: WN older WF who is alert and generally healthy appearing. Somewhat hard of hearing.  Breasts:  Left:  Now she has circumareolar redness which is resolving.  Dr. Daphine Deutscher had placed her on antibiotics, but whether these helped or not is not clear.  [photo taken again]  Right: Scars at 12 and 9 o'clock. Lymph nodes:  She has no cervical, supraclavicular, or axillary adenopathy.  DATA REVIEWED: Mammogram reports.  Ovidio Kin, MD,  [facility] [address] [place]  [address]   [place], [place]    [address] Phone:  [phone] FAX:  [phone]

## 2012-04-05 ENCOUNTER — Telehealth (INDEPENDENT_AMBULATORY_CARE_PROVIDER_SITE_OTHER): Payer: Self-pay

## 2012-04-05 NOTE — Telephone Encounter (Signed)
The pt called and states she has another blister on her breast that Dr Ezzard Standing biopsied.  She wanted to know what to do.  I told her let's watch it a little bit.  I asked her to try a warm compress.  I asked her to call me if it worsens and I will check on her late this week.

## 2012-04-08 NOTE — Telephone Encounter (Signed)
I called the patient to check on her and she states the blister is pretty much gone.  It still itches across the top of the nipple and the rest looks the same.  We agreed we will keep her appt as is and she will call if it comes back.

## 2012-04-16 ENCOUNTER — Encounter (INDEPENDENT_AMBULATORY_CARE_PROVIDER_SITE_OTHER): Payer: Self-pay | Admitting: General Surgery

## 2012-04-16 ENCOUNTER — Ambulatory Visit (INDEPENDENT_AMBULATORY_CARE_PROVIDER_SITE_OTHER): Payer: Medicare Other | Admitting: General Surgery

## 2012-04-16 ENCOUNTER — Telehealth (INDEPENDENT_AMBULATORY_CARE_PROVIDER_SITE_OTHER): Payer: Self-pay

## 2012-04-16 VITALS — BP 140/76 | HR 100 | Temp 97.8°F | Resp 18 | Ht 62.5 in | Wt 110.2 lb

## 2012-04-16 DIAGNOSIS — N6459 Other signs and symptoms in breast: Secondary | ICD-10-CM | POA: Diagnosis not present

## 2012-04-16 MED ORDER — NYSTATIN-TRIAMCINOLONE 100000-0.1 UNIT/GM-% EX OINT: TOPICAL_OINTMENT | Freq: Two times a day (BID) | CUTANEOUS | Status: DC

## 2012-04-16 NOTE — Telephone Encounter (Signed)
The patient left today without her prescription.  I call it in to the [facility] [phone] ( nystatin-triamcinolone ointment Apply topically 2 times daily dispense quantity 30g, no refill.  I am trying to reach the pt to let her know.

## 2012-04-16 NOTE — Progress Notes (Signed)
Chief complaint: Blister at the left nipple  History: Patient returns to the office. She has had multiple visits and followed by Dr. Ezzard Standing for persistent skin changes at her left nipple with onset December of last year. She has had a biopsy about 6 weeks ago by Dr. Ezzard Standing that showed only dermal inflammation. She has had episodic blisters form on the areola and nipple with chronic skin irritation. She states another blister came up yesterday and she called and was worked into the urgent office. Overall she does not feel that the process is getting better or worse over the last number of months.  Past medical history is documented elsewhere about pertinent on today's visit for history of breast biopsy with a circumareolar incision on the left over 30 years ago and she states she has had chronic nipple inversion since that time.  The patient also states today that she's had 3 areas come up on her back of itching irritated skin over the last week.  Exam: Skin: On the right mid back left upper back and left lower back or 3 quite circular areas of mildly thickened irritated or erythematous skin. These measure from about 2-1 cm.  Breasts: The left nipple has a 1 cm superficial clear blister laterally. I aspirated this sterilely and got clear fluid. Her nipple is inverted which appears chronic. There is chronic appearing skin thickening and irritation of the entire nipple areolar complex but does not look totally dissimilar from areas on her back  Assessment and plan: Chronic skin irritation blistering of the left nipple. Etiology is unclear. Biopsy was negative for Paget's disease. The biopsy did not clearly indicate a fungal infection either although I wonder if this would be in the diagnosis particularly with her other skin lesions. I gave her a prescription for nystatin/triamcinolone cream to drive for the next couple of weeks on the skin lesions and nipple. She may need further biopsies to try to rule  out Paget's disease. She will follow up with Dr. Ezzard Standing in 2-3 weeks.

## 2012-04-16 NOTE — Telephone Encounter (Signed)
I contacted the pt and advised her that I called in the rx.  I told her to make sure and apply it to her nipple and back

## 2012-04-22 ENCOUNTER — Ambulatory Visit (INDEPENDENT_AMBULATORY_CARE_PROVIDER_SITE_OTHER): Payer: Medicare Other | Admitting: Surgery

## 2012-04-22 ENCOUNTER — Encounter (INDEPENDENT_AMBULATORY_CARE_PROVIDER_SITE_OTHER): Payer: Self-pay | Admitting: Surgery

## 2012-04-22 VITALS — BP 140/82 | HR 90 | Temp 96.4°F | Resp 18 | Ht 62.5 in | Wt 110.4 lb

## 2012-04-22 DIAGNOSIS — N6459 Other signs and symptoms in breast: Secondary | ICD-10-CM

## 2012-04-22 NOTE — Progress Notes (Addendum)
Re:   Erin Ryan DOB:   December 14, 1924 MRN:   [id number]  Urgent Office Patient  ASSESSMENT AND PLAN: 1.  Left nipple skin cyst/blister.  I first saw her 01/02/2012 with a blister of the left nipple/areola.  I did a punch/open biopsy of the skin of the areola 02/25/2012 - this was benign.  She saw Dr. Johna Sheriff on 04/16/2012, who wondered whether this had a fungal origin.  Persistent inflammation/irritation of unknown etiology.  I think it is worth her seeing a dermatologist to assist in the work up and management of these skin changes.  I will otherwise see her back in September.  [Saw Satira Mccallum PA, who works with [facility] - biopsy left areola - bullous pemphigoid.  Treated with steroids.  DN 05/21/2012]   2.  Dr. Chilton Si has raised the questions of Paget's disease, though the skin biopsy was negative - 02/25/2012.   3.  Hypertension 4.  GERD  Saw Dr. Jonni Sanger - who described pulmonary fibrosis 4.  Arthritis  REFERRING PHYSICIAN: Cala Bradford, MD  HISTORY OF PRESENT ILLNESS: Erin Ryan is a 78 y.o. (DOB: 04-19-1925)  white female whose primary care physician is WHITE,CYNTHIA S, MD and comes to me today for follow up of left nipple cyst/blister.  Her daughter is not here with her, Erin Ryan: (W) [phone], Cell: [phone]) but I talked to her on the phone.  The patient has had prior bilateral breast biopsies by Dr. Rebekah Chesterfield and Dr. Verne Carrow many years ago. There is a circumareolar incision around the left nipple.  She noticed some changes of her left nipple around Dec 2012. She brought this to the attention of Dr. Charlott Rakes. She obtained a mammogram of the left breast 02 October 2011 which was negative.  I did a biopsy of the left nipple on 02/25/2012 which was negative.  But she has had persistent inflammation/irritation of the left nipple with pigmentation.  There seems to be a cycle of blisters, followed by attempted healing. I have taken photos and the left nipple looks  about the same as when I last saw here, but worse that the 01/02/2012 photos.  I am at a loss to the diagnosis.  She tried antibiotics with Dr. Daphine Deutscher and she is now on an antifungal prescribed by Dr. Johna Sheriff.  She is heading to [facility] in September.   Past Medical History  Diagnosis Date  . Osteoporosis   . Hypertension   . High cholesterol   . GERD (gastroesophageal reflux disease)   . Arthritis   . Thyroid disease      Current Outpatient Prescriptions  Medication Sig Dispense Refill  . amLODipine (NORVASC) 5 MG tablet Take 5 mg by mouth daily.        Marland Kitchen aspirin 81 MG tablet Take 81 mg by mouth daily.      Marland Kitchen atorvastatin (LIPITOR) 40 MG tablet Take 40 mg by mouth daily.        . calcium carbonate (OS-CAL - DOSED IN MG OF ELEMENTAL CALCIUM) 1250 MG tablet Take 1 tablet by mouth daily.        Marland Kitchen levothyroxine (SYNTHROID, LEVOTHROID) 75 MCG tablet Take 75 mcg by mouth daily.        Marland Kitchen nystatin-triamcinolone ointment (MYCOLOG) Apply topically 2 (two) times daily.  30 g  0  . olmesartan (BENICAR) 40 MG tablet Take 40 mg by mouth daily.        Marland Kitchen omeprazole (PRILOSEC) 20 MG capsule Take 20  mg by mouth daily.        Marland Kitchen PARoxetine (PAXIL) 10 MG tablet Take 10 mg by mouth every morning.        . vitamin E 100 UNIT capsule Take 100 Units by mouth daily.      . Zoledronic Acid (RECLAST IV) Inject into the vein once.         No Known Allergies  REVIEW OF SYSTEMS: Cardiac:  Hypertension Pulmonary: Saw Dr. Delford Field.  ?reflux induced pulmonary disease. Endocrine:  No diabetes. Prior thyroid surgery for benign disease.  SOCIAL and FAMILY HISTORY: Daughter, Erin Ryan (W: [phone], Cell: [phone]) (works as a Armed forces operational officer), is with patient.  Only child. She just had a colectomy by Dr. Michaell Cowing.  PHYSICAL EXAM: BP 140/82  Pulse 90  Temp 96.4 F (35.8 C) (Temporal)  Resp 18  Ht 5' 2.5" (1.588 m)  Wt 110 lb 6 oz (50.066 kg)  BMI 19.87 kg/m2  General: WN older WF who is alert and generally  healthy appearing. Somewhat hard of hearing.  Breasts:  Left:  Now she has circumareolar redness with evidence of blistering (2 to 4 mm).  It has not changed much since I last saw her, nor has it improved much.   Right: Scars at 12 and 9 o'clock. Lymph nodes:  She has no cervical, supraclavicular, or axillary adenopathy. Skin:  2 cm rash right back and 1 cm superficial rash left posterior axilla.  DATA REVIEWED: Mammogram reports.  Ovidio Kin, MD,  [facility] [address] [place]  [address]   [place], [place]    [address] Phone:  [phone] FAX:  [phone]

## 2012-04-23 ENCOUNTER — Other Ambulatory Visit: Payer: Self-pay

## 2012-04-23 DIAGNOSIS — L129 Pemphigoid, unspecified: Secondary | ICD-10-CM | POA: Diagnosis not present

## 2012-04-23 DIAGNOSIS — L988 Other specified disorders of the skin and subcutaneous tissue: Secondary | ICD-10-CM | POA: Diagnosis not present

## 2012-05-11 DIAGNOSIS — I129 Hypertensive chronic kidney disease with stage 1 through stage 4 chronic kidney disease, or unspecified chronic kidney disease: Secondary | ICD-10-CM | POA: Diagnosis not present

## 2012-05-11 DIAGNOSIS — E785 Hyperlipidemia, unspecified: Secondary | ICD-10-CM | POA: Diagnosis not present

## 2012-05-11 DIAGNOSIS — K219 Gastro-esophageal reflux disease without esophagitis: Secondary | ICD-10-CM | POA: Diagnosis not present

## 2012-05-11 DIAGNOSIS — F411 Generalized anxiety disorder: Secondary | ICD-10-CM | POA: Diagnosis not present

## 2012-05-11 DIAGNOSIS — E039 Hypothyroidism, unspecified: Secondary | ICD-10-CM | POA: Diagnosis not present

## 2012-05-11 DIAGNOSIS — J45909 Unspecified asthma, uncomplicated: Secondary | ICD-10-CM | POA: Diagnosis not present

## 2012-05-11 DIAGNOSIS — L851 Acquired keratosis [keratoderma] palmaris et plantaris: Secondary | ICD-10-CM | POA: Diagnosis not present

## 2012-05-11 DIAGNOSIS — J069 Acute upper respiratory infection, unspecified: Secondary | ICD-10-CM | POA: Diagnosis not present

## 2012-05-18 DIAGNOSIS — M542 Cervicalgia: Secondary | ICD-10-CM | POA: Diagnosis not present

## 2012-05-20 DIAGNOSIS — M542 Cervicalgia: Secondary | ICD-10-CM | POA: Diagnosis not present

## 2012-05-27 DIAGNOSIS — M542 Cervicalgia: Secondary | ICD-10-CM | POA: Diagnosis not present

## 2012-05-31 DIAGNOSIS — M542 Cervicalgia: Secondary | ICD-10-CM | POA: Diagnosis not present

## 2012-06-03 DIAGNOSIS — M542 Cervicalgia: Secondary | ICD-10-CM | POA: Diagnosis not present

## 2012-06-14 DIAGNOSIS — M542 Cervicalgia: Secondary | ICD-10-CM | POA: Diagnosis not present

## 2012-06-17 ENCOUNTER — Encounter (INDEPENDENT_AMBULATORY_CARE_PROVIDER_SITE_OTHER): Payer: Self-pay | Admitting: Surgery

## 2012-06-17 ENCOUNTER — Encounter (INDEPENDENT_AMBULATORY_CARE_PROVIDER_SITE_OTHER): Payer: Medicare Other | Admitting: Surgery

## 2012-06-17 ENCOUNTER — Ambulatory Visit (INDEPENDENT_AMBULATORY_CARE_PROVIDER_SITE_OTHER): Payer: Medicare Other | Admitting: Surgery

## 2012-06-17 VITALS — BP 146/80 | HR 72 | Temp 97.8°F | Resp 14 | Ht 62.5 in | Wt 111.4 lb

## 2012-06-17 DIAGNOSIS — N6459 Other signs and symptoms in breast: Secondary | ICD-10-CM

## 2012-06-17 DIAGNOSIS — M542 Cervicalgia: Secondary | ICD-10-CM | POA: Diagnosis not present

## 2012-06-17 NOTE — Progress Notes (Signed)
Re:   Erin Ryan DOB:   Ryan MRN:   [id number]  Urgent Office Patient  ASSESSMENT AND PLAN: 1.  Left nipple skin dermatitis.  I first saw her 01/02/2012 with a blister of the left nipple/areola.  I did a punch/open biopsy of the skin of the areola 02/25/2012 - this was benign.  Saw Erin Mccallum, PA, who works with Erin Ryan.  Treated with steroids (clobetasol propionate)  The left nipple is getting better.    She has follow up mammograms in Jan 2014.  I made this her last visit with me.   2.  Hypertension 3.  GERD  Saw Dr. Jonni Ryan - who described pulmonary fibrosis 4.  Arthritis  REFERRING PHYSICIAN: Cala Bradford, MD  HISTORY OF PRESENT ILLNESS: Erin Ryan is a 78 y.o. (DOB: [birth year])  white female whose primary care physician is Erin S, MD and comes to me today for follow up of left nipple cyst/blister.  Her daughter is not here with her, Erin Ryan: (W) [phone], Cell: [phone]) but I talked to her on the phone.  She is dong better since being put on the steroid cream by Erin Ryan.  She is happy it is getting better.  She has no new concerns.  She got back last weekend from [address].  She had a good trip.  Breast History: The patient has had prior bilateral breast biopsies by Dr. Rebekah Ryan and Dr. Verne Ryan many years ago. There is a circumareolar incision around the left nipple.  She noticed some changes of her left nipple around Dec 2012. She brought this to the attention of Erin Ryan. She obtained a mammogram of the left breast 02 October 2011 which was negative.  There was a concern raised about Paget'Ryan disease.  I did a biopsy of the left nipple on 02/25/2012 which was negative.  But she has had persistent inflammation/irritation of the left nipple with pigmentation.  There seems to be a cycle of blisters, followed by attempted healing. I have taken photos and the left nipple looks about the same  as when I last saw here, but worse that the 01/02/2012 photos.  She tried antibiotics with Erin Ryan and she tired an antifungal prescribed by Erin Ryan.  05/21/2102 - Saw Erin Mccallum PA, who works with Erin Ryan.  Treated with steroids.   She is heading to [facility] in September.   Past Medical History  Diagnosis Date  . Osteoporosis   . Hypertension   . High cholesterol   . GERD (gastroesophageal reflux disease)   . Arthritis   . Thyroid disease      Current Outpatient Prescriptions  Medication Sig Dispense Refill  . levothyroxine (SYNTHROID, LEVOTHROID) 88 MCG tablet Take 88 mcg by mouth daily.      Marland Kitchen amLODipine (NORVASC) 5 MG tablet Take 5 mg by mouth daily.        Marland Kitchen aspirin 81 MG tablet Take 81 mg by mouth daily.      Marland Kitchen atorvastatin (LIPITOR) 40 MG tablet Take 40 mg by mouth daily.        . calcium carbonate (OS-CAL - DOSED IN MG OF ELEMENTAL CALCIUM) 1250 MG tablet Take 1 tablet by mouth daily.        Marland Kitchen nystatin-triamcinolone ointment (MYCOLOG) Apply topically 2 (two) times daily.  30 g  0  . olmesartan (BENICAR) 40 MG tablet Take 40  mg by mouth daily.        Marland Kitchen omeprazole (PRILOSEC) 20 MG capsule Take 20 mg by mouth daily.        Marland Kitchen PARoxetine (PAXIL) 10 MG tablet Take 10 mg by mouth every morning.        . vitamin E 100 UNIT capsule Take 100 Units by mouth daily.      . Zoledronic Acid (RECLAST IV) Inject into the vein once.         No Known Allergies  REVIEW OF SYSTEMS: Cardiac:  Hypertension Pulmonary: Saw Erin Ryan.  ?reflux induced pulmonary disease. Endocrine:  No diabetes. Prior thyroid surgery for benign disease.  SOCIAL and FAMILY HISTORY: Daughter, Erin Ryan (W: [phone], Cell: [phone]) (works as a Armed forces operational officer), is with patient.  Only child. She just had a colectomy by Erin Ryan.  PHYSICAL EXAM: BP 146/80  Pulse 72  Temp 97.8 F (36.6 C) (Temporal)  Resp 14  Ht 5' 2.5" (1.588 m)  Wt 111 lb 6 oz  (50.519 kg)  BMI 20.05 kg/m2  General: WN older WF who is alert and generally healthy appearing. Somewhat hard of hearing. Breasts:  Left:  Still pigmentation around left nipple, but better.  She has mild inversion of the nipple.  No other mass or area of concern.  Right: Scars at 12 and 9 o'clock. Lymph nodes:  She has no cervical, supraclavicular, or axillary adenopathy.  DATA REVIEWED: Notes from Dr. Dorita Sciara office.  Erin Kin, MD,  [facility] [address] [place]  [address]   [place], [place]    [address] Phone:  [phone] FAX:  [phone]

## 2012-06-21 DIAGNOSIS — M542 Cervicalgia: Secondary | ICD-10-CM | POA: Diagnosis not present

## 2012-06-25 DIAGNOSIS — Z23 Encounter for immunization: Secondary | ICD-10-CM | POA: Diagnosis not present

## 2012-06-25 DIAGNOSIS — E039 Hypothyroidism, unspecified: Secondary | ICD-10-CM | POA: Diagnosis not present

## 2012-07-12 DIAGNOSIS — R0989 Other specified symptoms and signs involving the circulatory and respiratory systems: Secondary | ICD-10-CM | POA: Diagnosis not present

## 2012-07-12 DIAGNOSIS — R0609 Other forms of dyspnea: Secondary | ICD-10-CM | POA: Diagnosis not present

## 2012-07-23 ENCOUNTER — Encounter (INDEPENDENT_AMBULATORY_CARE_PROVIDER_SITE_OTHER): Payer: Medicare Other | Admitting: Surgery

## 2012-08-03 DIAGNOSIS — J209 Acute bronchitis, unspecified: Secondary | ICD-10-CM | POA: Diagnosis not present

## 2012-08-12 DIAGNOSIS — H04129 Dry eye syndrome of unspecified lacrimal gland: Secondary | ICD-10-CM | POA: Diagnosis not present

## 2012-08-23 ENCOUNTER — Ambulatory Visit: Payer: Medicare Other | Admitting: Critical Care Medicine

## 2012-08-27 ENCOUNTER — Ambulatory Visit (INDEPENDENT_AMBULATORY_CARE_PROVIDER_SITE_OTHER)
Admission: RE | Admit: 2012-08-27 | Discharge: 2012-08-27 | Disposition: A | Discharge status: Home / Self Care | Payer: Medicare Other | Source: Ambulatory Visit | Attending: Critical Care Medicine | Admitting: Critical Care Medicine

## 2012-08-27 ENCOUNTER — Encounter: Payer: Self-pay | Admitting: Critical Care Medicine

## 2012-08-27 ENCOUNTER — Telehealth: Payer: Self-pay | Admitting: *Deleted

## 2012-08-27 ENCOUNTER — Ambulatory Visit (INDEPENDENT_AMBULATORY_CARE_PROVIDER_SITE_OTHER): Payer: Medicare Other | Admitting: Critical Care Medicine

## 2012-08-27 VITALS — BP 142/84 | HR 104 | Temp 98.4°F | Ht 62.5 in | Wt 112.4 lb

## 2012-08-27 DIAGNOSIS — E039 Hypothyroidism, unspecified: Secondary | ICD-10-CM | POA: Diagnosis not present

## 2012-08-27 DIAGNOSIS — E785 Hyperlipidemia, unspecified: Secondary | ICD-10-CM | POA: Diagnosis not present

## 2012-08-27 DIAGNOSIS — R0600 Dyspnea, unspecified: Secondary | ICD-10-CM

## 2012-08-27 DIAGNOSIS — R0989 Other specified symptoms and signs involving the circulatory and respiratory systems: Secondary | ICD-10-CM

## 2012-08-27 DIAGNOSIS — R0609 Other forms of dyspnea: Secondary | ICD-10-CM

## 2012-08-27 DIAGNOSIS — J438 Other emphysema: Secondary | ICD-10-CM | POA: Diagnosis not present

## 2012-08-27 MED ORDER — TIOTROPIUM BROMIDE MONOHYDRATE 18 MCG IN CAPS: 18.0000 ug | ORAL_CAPSULE | Freq: Every day | RESPIRATORY_TRACT | Status: DC

## 2012-08-27 MED ORDER — CHLORPHENIRAMINE TANNATE 8 MG PO TABS: 1.0000 | ORAL_TABLET | Freq: Every day | ORAL | Status: DC

## 2012-08-27 NOTE — Telephone Encounter (Signed)
Message copied by Gweneth Dimitri D on Fri Aug 27, 2012  4:58 PM ------      Message from: Storm Frisk      Created: Fri Aug 27, 2012  4:44 PM       This pt needs:            Full PFTs and 6 min walk            Start spiriva  I send order for both            Call the pt and tel her cxr does show emphysema and air trapping            pw

## 2012-08-27 NOTE — Telephone Encounter (Signed)
[email or web address]  

## 2012-08-27 NOTE — Patient Instructions (Signed)
Follow reflux diet PRactice purse lip breathing Obtain a chest xray Take chlorpheniramine 8mg  at bedtime for drainage Keep sugar free candy drop in mouth, if get urge to cough or clear throat, swallow Stop pulmicort Return 2 months

## 2012-08-27 NOTE — Progress Notes (Signed)
Subjective:    Patient ID: Erin Ryan, female    DOB: November 24, 1924, 78 y.o.   MRN: [id number]  HPI  78 y.o.WF  08/27/2012 Pt not seen in 50yrs. Prior hx of GERD, VCD, microaspiration with lower airway inflammation. No  Obstruction on prior pfts. Pt notes progressive dyspnea with any exertion.  Not abele to do much , walk to car and is out of breath. ++passive smoke exposure Not much cough. Gets phlegm and is clear. Stays hoarse.  Pt notes sinus pressure.   No real wheeze.  No heartburn.  No dysphagia.  No cough or choke with food. No chest pain.  Pt feels tight in the chest.  No edema in feet.  Past Medical History  Diagnosis Date  . Osteoporosis   . Hypertension   . High cholesterol   . GERD (gastroesophageal reflux disease)   . Arthritis   . Thyroid disease      Family History  Problem Relation Age of Onset  . Heart disease Mother   . Heart disease Father   . Asthma Father   . Bladder Cancer Mother      History   Social History  . Marital Status: Widowed    Spouse Name: N/A    Number of Children: N/A  . Years of Education: N/A   Occupational History  . Retired     Metallurgist   Social History Main Topics  . Smoking status: Never Smoker   . Smokeless tobacco: Never Used  . Alcohol Use: No  . Drug Use: No  . Sexually Active: Not on file   Other Topics Concern  . Not on file   Social History Narrative  . No narrative on file     Allergies  Allergen Reactions  . Hydrochlorothiazide     hyponatremia     Outpatient Prescriptions Prior to Visit  Medication Sig Dispense Refill  . amLODipine (NORVASC) 5 MG tablet Take 5 mg by mouth daily.        Marland Kitchen aspirin 81 MG tablet Take 81 mg by mouth daily.      Marland Kitchen atorvastatin (LIPITOR) 40 MG tablet Take 40 mg by mouth daily.        . calcium carbonate (OS-CAL - DOSED IN MG OF ELEMENTAL CALCIUM) 1250 MG tablet Take 2 tablets by mouth daily.       Marland Kitchen levothyroxine (SYNTHROID, LEVOTHROID) 88 MCG tablet Take 88 mcg by  mouth daily.      Marland Kitchen olmesartan (BENICAR) 40 MG tablet Take 40 mg by mouth daily.        Marland Kitchen omeprazole (PRILOSEC) 20 MG capsule Take 20 mg by mouth daily.        Marland Kitchen PARoxetine (PAXIL) 10 MG tablet Take 10 mg by mouth every morning.        . vitamin E 100 UNIT capsule Take 100 Units by mouth daily.      . Zoledronic Acid (RECLAST IV) Inject into the vein. Yearly      . [DISCONTINUED] nystatin-triamcinolone ointment (MYCOLOG) Apply topically 2 (two) times daily.  30 g  0   Last reviewed on 08/27/2012  3:08 PM by Storm Frisk, MD     Review of Systems  Constitutional: Positive for activity change, fatigue and unexpected weight change. Negative for fever, chills, diaphoresis and appetite change.  HENT: Positive for hearing loss, voice change and sinus pressure. Negative for ear pain, nosebleeds, congestion, sore throat, facial swelling, rhinorrhea, sneezing, mouth sores, trouble swallowing, neck pain,  neck stiffness, dental problem, postnasal drip, tinnitus and ear discharge.   Eyes: Positive for itching. Negative for photophobia, discharge and visual disturbance.  Respiratory: Positive for cough, chest tightness and shortness of breath. Negative for apnea, choking, wheezing and stridor.   Cardiovascular: Positive for leg swelling. Negative for chest pain and palpitations.  Gastrointestinal: Negative for nausea, vomiting, abdominal pain, constipation, blood in stool and abdominal distention.  Genitourinary: Negative for dysuria, urgency, frequency, hematuria, flank pain, decreased urine volume and difficulty urinating.  Musculoskeletal: Positive for joint swelling and gait problem. Negative for myalgias, back pain and arthralgias.  Skin: Negative for color change, pallor and rash.  Neurological: Positive for light-headedness. Negative for dizziness, tremors, seizures, syncope, speech difficulty, weakness, numbness and headaches.  Hematological: Negative for adenopathy. Does not bruise/bleed  easily.  Psychiatric/Behavioral: Negative for confusion, sleep disturbance and agitation. The patient is not nervous/anxious.        Objective:   Physical Exam Filed Vitals:   08/27/12 1458  BP: 142/84  Pulse: 104  Temp: 98.4 F (36.9 C)  TempSrc: Oral  Height: 5' 2.5" (1.588 m)  Weight: 112 lb 6.4 oz (50.984 kg)  SpO2: 96%    Gen: Pleasant, well-nourished, in no distress,  normal affect  ENT: No lesions,  mouth clear,  oropharynx clear, no postnasal drip  Neck: No JVD, no TMG, no carotid bruits  Lungs: No use of accessory muscles, no dullness to percussion, distant BS  Cardiovascular: RRR, heart sounds normal, no murmur or gallops, no peripheral edema  Abdomen: soft and NT, no HSM,  BS normal  Musculoskeletal: No deformities, no cyanosis or clubbing  Neuro: alert, non focal  Skin: Warm, no lesions or rashes  No results found.        Assessment & Plan:   Dyspnea Dyspnea ? Occult Copd /airway obstruction despite normal spirometry? CXR with flattened diaphragms. Hx of passive smoke exposure Plan Full pfts and 6 min walk Trial spiriva daily   Updated Medication List Outpatient Encounter Prescriptions as of 08/27/2012  Medication Sig Dispense Refill  . amLODipine (NORVASC) 5 MG tablet Take 5 mg by mouth daily.        Marland Kitchen aspirin 81 MG tablet Take 81 mg by mouth daily.      Marland Kitchen atorvastatin (LIPITOR) 40 MG tablet Take 40 mg by mouth daily.        . calcium carbonate (OS-CAL - DOSED IN MG OF ELEMENTAL CALCIUM) 1250 MG tablet Take 2 tablets by mouth daily.       . clobetasol cream (TEMOVATE) 0.05 % Apply topically 2 (two) times daily as needed.      Marland Kitchen levothyroxine (SYNTHROID, LEVOTHROID) 88 MCG tablet Take 88 mcg by mouth daily.      Marland Kitchen olmesartan (BENICAR) 40 MG tablet Take 40 mg by mouth daily.        Marland Kitchen omeprazole (PRILOSEC) 20 MG capsule Take 20 mg by mouth daily.        Marland Kitchen PARoxetine (PAXIL) 10 MG tablet Take 10 mg by mouth every morning.        . vitamin E 100  UNIT capsule Take 100 Units by mouth daily.      . Zoledronic Acid (RECLAST IV) Inject into the vein. Yearly      . [DISCONTINUED] budesonide (PULMICORT FLEXHALER) 180 MCG/ACT inhaler Inhale 2 puffs into the lungs. 1-2 times daily as needed      . Chlorpheniramine Tannate 8 MG TABS Take 1 tablet (8 mg total) by mouth at  bedtime.  30 each  6  . tiotropium (SPIRIVA HANDIHALER) 18 MCG inhalation capsule Place 1 capsule (18 mcg total) into inhaler and inhale daily.  30 capsule  6  . [DISCONTINUED] Chlorpheniramine Tannate 8 MG TABS Take 1 tablet (8 mg total) by mouth at bedtime.  30 each  6  . [DISCONTINUED] nystatin-triamcinolone ointment (MYCOLOG) Apply topically 2 (two) times daily.  30 g  0

## 2012-08-29 NOTE — Assessment & Plan Note (Signed)
Dyspnea ? Occult Copd /airway obstruction despite normal spirometry? CXR with flattened diaphragms. Hx of passive smoke exposure Plan Full pfts and 6 min walk Trial spiriva daily

## 2012-08-30 ENCOUNTER — Other Ambulatory Visit: Payer: Self-pay | Admitting: Family Medicine

## 2012-08-30 NOTE — Telephone Encounter (Signed)
Returning call can be reached at [phone].Erin Ryan

## 2012-08-30 NOTE — Telephone Encounter (Signed)
Pt called back & is concerned that Crystal hasn't called her back.  Antionette Fairy

## 2012-08-30 NOTE — Telephone Encounter (Signed)
Called, spoke with pt.  Apologized to pt for delay in calling her back today.  She was very understanding.  I informed her of below recs and results per Dr. Delford Field.  She verbalized understanding of this and states she is already taking the spiriva.  Pt states she doesn't have any questions on how to use this inhaler.  We have scheduled pt for SMW and PFT on Thursday, Dec 12 -- pt to arrive at 9:30 for SMW then PFT afterwards.  She is aware of this and verbalized understanding.  Pt voiced no further questions or concerns at this time.

## 2012-09-02 ENCOUNTER — Ambulatory Visit (INDEPENDENT_AMBULATORY_CARE_PROVIDER_SITE_OTHER): Payer: Medicare Other | Admitting: Critical Care Medicine

## 2012-09-02 DIAGNOSIS — R0600 Dyspnea, unspecified: Secondary | ICD-10-CM

## 2012-09-02 DIAGNOSIS — R0609 Other forms of dyspnea: Secondary | ICD-10-CM | POA: Diagnosis not present

## 2012-09-02 DIAGNOSIS — R0989 Other specified symptoms and signs involving the circulatory and respiratory systems: Secondary | ICD-10-CM | POA: Diagnosis not present

## 2012-09-02 LAB — PULMONARY FUNCTION TEST

## 2012-09-02 NOTE — Progress Notes (Signed)
PFT done today. 

## 2012-09-03 ENCOUNTER — Telehealth: Payer: Self-pay | Admitting: Critical Care Medicine

## 2012-09-03 DIAGNOSIS — M810 Age-related osteoporosis without current pathological fracture: Secondary | ICD-10-CM | POA: Diagnosis not present

## 2012-09-06 ENCOUNTER — Telehealth: Payer: Self-pay | Admitting: Critical Care Medicine

## 2012-09-06 DIAGNOSIS — R0600 Dyspnea, unspecified: Secondary | ICD-10-CM

## 2012-09-06 NOTE — Telephone Encounter (Signed)
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 Pt to stay on spiriva She was notified of results. ( I spoke to the daughter)

## 2012-09-06 NOTE — Telephone Encounter (Signed)
Called, spoke with Robynn Pane, pharmacist at [facility].  States this has already been taken care of.  Per Robynn Pane, change called in by Lillia Abed for chlorpheniramine 4 mg tablets 2 tablets qhs.  Nothing further needed.

## 2012-09-08 ENCOUNTER — Other Ambulatory Visit (HOSPITAL_COMMUNITY): Payer: Self-pay | Admitting: Family Medicine

## 2012-09-08 ENCOUNTER — Encounter: Payer: Self-pay | Admitting: Critical Care Medicine

## 2012-09-10 ENCOUNTER — Encounter (HOSPITAL_COMMUNITY): Payer: Self-pay

## 2012-09-10 ENCOUNTER — Ambulatory Visit: Payer: Self-pay | Admitting: Critical Care Medicine

## 2012-09-10 ENCOUNTER — Ambulatory Visit (HOSPITAL_COMMUNITY)
Admission: RE | Admit: 2012-09-10 | Discharge: 2012-09-10 | Disposition: A | Discharge status: Home / Self Care | Payer: Medicare Other | Source: Ambulatory Visit | Attending: Family Medicine | Admitting: Family Medicine

## 2012-09-10 DIAGNOSIS — M810 Age-related osteoporosis without current pathological fracture: Secondary | ICD-10-CM | POA: Insufficient documentation

## 2012-09-10 HISTORY — DX: Chronic obstructive pulmonary disease, unspecified: J44.9

## 2012-09-10 MED ORDER — SODIUM CHLORIDE 0.9 % IV SOLN
Freq: Once | INTRAVENOUS | Status: AC
  Administered 2012-09-10: 09:00:00 via INTRAVENOUS

## 2012-09-10 MED ORDER — ZOLEDRONIC ACID 5 MG/100ML IV SOLN
5.0000 mg | Freq: Once | INTRAVENOUS | Status: AC
  Administered 2012-09-10: 5 mg via INTRAVENOUS
  Filled 2012-09-10: qty 100

## 2012-09-27 DIAGNOSIS — L129 Pemphigoid, unspecified: Secondary | ICD-10-CM | POA: Diagnosis not present

## 2012-10-05 ENCOUNTER — Telehealth: Payer: Self-pay | Admitting: Critical Care Medicine

## 2012-10-05 NOTE — Telephone Encounter (Signed)
Spoke with patient in regards to [facility] expensive and she is wanting to know if there is another option for her to take that will be cheaper. Patient also read the insert and it stated that if she has HTN to not take the medication. Patient encouraged to take medications as Rxd by Dr Delford Field until told otherwise.  Next OV 11/10/12   Please advise Dr Delford Field is any other medication options for Spiriva for patient cannot afford it at this time. Patient would also like some samples to have until she get a new medications alternative for her.

## 2012-10-06 NOTE — Telephone Encounter (Signed)
I called the pt.  I will leave free samples in a paper bag out front for this pt to pick up and use She knows HTN is not an issue with spiriva

## 2012-10-25 ENCOUNTER — Telehealth: Payer: Self-pay | Admitting: Critical Care Medicine

## 2012-10-25 DIAGNOSIS — M810 Age-related osteoporosis without current pathological fracture: Secondary | ICD-10-CM | POA: Diagnosis not present

## 2012-10-25 DIAGNOSIS — E785 Hyperlipidemia, unspecified: Secondary | ICD-10-CM | POA: Diagnosis not present

## 2012-10-25 DIAGNOSIS — F411 Generalized anxiety disorder: Secondary | ICD-10-CM | POA: Diagnosis not present

## 2012-10-25 DIAGNOSIS — E039 Hypothyroidism, unspecified: Secondary | ICD-10-CM | POA: Diagnosis not present

## 2012-10-25 DIAGNOSIS — I129 Hypertensive chronic kidney disease with stage 1 through stage 4 chronic kidney disease, or unspecified chronic kidney disease: Secondary | ICD-10-CM | POA: Diagnosis not present

## 2012-10-25 NOTE — Telephone Encounter (Signed)
Spoke with pt verify she is on spiriva not symbicort . And with the spiriva she is to inhale once a day. Pt verbally understood nothing  Further needed.Erin KitchenMarland KitchenMarland Ryan

## 2012-11-01 ENCOUNTER — Other Ambulatory Visit: Payer: Self-pay | Admitting: Family Medicine

## 2012-11-01 DIAGNOSIS — M810 Age-related osteoporosis without current pathological fracture: Secondary | ICD-10-CM

## 2012-11-02 ENCOUNTER — Ambulatory Visit: Payer: Medicare Other | Admitting: Critical Care Medicine

## 2012-11-06 ENCOUNTER — Other Ambulatory Visit: Payer: Self-pay

## 2012-11-08 ENCOUNTER — Other Ambulatory Visit: Payer: Medicare Other

## 2012-11-10 ENCOUNTER — Encounter: Payer: Self-pay | Admitting: Critical Care Medicine

## 2012-11-10 ENCOUNTER — Ambulatory Visit (INDEPENDENT_AMBULATORY_CARE_PROVIDER_SITE_OTHER): Payer: Medicare Other | Admitting: Critical Care Medicine

## 2012-11-10 VITALS — BP 118/72 | HR 104 | Temp 97.0°F | Ht 62.5 in | Wt 112.4 lb

## 2012-11-10 DIAGNOSIS — J449 Chronic obstructive pulmonary disease, unspecified: Secondary | ICD-10-CM

## 2012-11-10 NOTE — Patient Instructions (Signed)
No change in medications Stay on spiriva Return 6 months

## 2012-11-10 NOTE — Progress Notes (Signed)
Subjective:    Patient ID: Erin Ryan, female    DOB: 1925/07/31, 79 y.o.   MRN: [id number]  HPI   79 y.o.WF  08/27/2012 Pt not seen in 92yrs. Prior hx of GERD, VCD, microaspiration with lower airway inflammation. No  Obstruction on prior pfts. Pt notes progressive dyspnea with any exertion.  Not abele to do much , walk to car and is out of breath. ++passive smoke exposure Not much cough. Gets phlegm and is clear. Stays hoarse.  Pt notes sinus pressure.   No real wheeze.  No heartburn.  No dysphagia.  No cough or choke with food. No chest pain.  Pt feels tight in the chest.  No edema in feet.  11/10/2012 Pt on spiriva.  Feels is better. Occ will cough up some phlegm.  Nose is stopped up. Uses saline nasal and it helps.  No real chest pain. Does get tight in the chest . Pt denies any significant sore throat, nasal congestion or excess secretions, fever, chills, sweats, unintended weight loss, pleurtic or exertional chest pain, orthopnea PND, or leg swelling Pt denies any increase in rescue therapy over baseline, denies waking up needing it or having any early am or nocturnal exacerbations of coughing/wheezing/or dyspnea. Pt also denies any obvious fluctuation in symptoms with  weather or environmental change or other alleviating or aggravating factors    Past Medical History  Diagnosis Date  . Osteoporosis   . Hypertension   . High cholesterol   . GERD (gastroesophageal reflux disease)   . Arthritis   . Thyroid disease   . COPD (chronic obstructive pulmonary disease)     emphysema     Family History  Problem Relation Age of Onset  . Heart disease Mother   . Heart disease Father   . Asthma Father   . Bladder Cancer Mother      History   Social History  . Marital Status: Widowed    Spouse Name: N/A    Number of Children: N/A  . Years of Education: N/A   Occupational History  . Retired     Metallurgist   Social History Main Topics  . Smoking status: Never Smoker    . Smokeless tobacco: Never Used  . Alcohol Use: No  . Drug Use: No  . Sexually Active: Not on file   Other Topics Concern  . Not on file   Social History Narrative  . No narrative on file     Allergies  Allergen Reactions  . Hydrochlorothiazide     hyponatremia     Outpatient Prescriptions Prior to Visit  Medication Sig Dispense Refill  . amLODipine (NORVASC) 5 MG tablet Take 5 mg by mouth daily.        Marland Kitchen aspirin 81 MG tablet Take 81 mg by mouth daily.      Marland Kitchen atorvastatin (LIPITOR) 40 MG tablet Take 40 mg by mouth daily.        . calcium carbonate (OS-CAL - DOSED IN MG OF ELEMENTAL CALCIUM) 1250 MG tablet Take 2 tablets by mouth daily.       . clobetasol cream (TEMOVATE) 0.05 % Apply topically 2 (two) times daily as needed.      Marland Kitchen levothyroxine (SYNTHROID, LEVOTHROID) 88 MCG tablet Take 88 mcg by mouth daily.      Marland Kitchen olmesartan (BENICAR) 40 MG tablet Take 40 mg by mouth daily.        Marland Kitchen omeprazole (PRILOSEC) 20 MG capsule Take 20 mg by mouth daily.        Marland Kitchen  PARoxetine (PAXIL) 10 MG tablet Take 10 mg by mouth every morning.        . tiotropium (SPIRIVA HANDIHALER) 18 MCG inhalation capsule Place 1 capsule (18 mcg total) into inhaler and inhale daily.  30 capsule  6  . vitamin E 100 UNIT capsule Take 100 Units by mouth daily.      . Zoledronic Acid (RECLAST IV) Inject into the vein. Yearly      . Chlorpheniramine Tannate 8 MG TABS Take 1 tablet (8 mg total) by mouth at bedtime.  30 each  6   No facility-administered medications prior to visit.       Review of Systems  Constitutional: Positive for activity change, fatigue and unexpected weight change. Negative for fever, chills, diaphoresis and appetite change.  HENT: Positive for hearing loss and voice change. Negative for ear pain, nosebleeds, congestion, sore throat, facial swelling, rhinorrhea, sneezing, mouth sores, trouble swallowing, neck pain, neck stiffness, dental problem, postnasal drip, sinus pressure, tinnitus and  ear discharge.   Eyes: Positive for itching. Negative for photophobia, discharge and visual disturbance.  Respiratory: Negative for apnea, cough, choking, chest tightness, shortness of breath, wheezing and stridor.   Cardiovascular: Negative for chest pain, palpitations and leg swelling.  Gastrointestinal: Negative for nausea, vomiting, abdominal pain, constipation, blood in stool and abdominal distention.  Genitourinary: Negative for dysuria, urgency, frequency, hematuria, flank pain, decreased urine volume and difficulty urinating.  Musculoskeletal: Positive for joint swelling and gait problem. Negative for myalgias, back pain and arthralgias.  Skin: Negative for color change, pallor and rash.  Neurological: Positive for light-headedness. Negative for dizziness, tremors, seizures, syncope, speech difficulty, weakness, numbness and headaches.  Hematological: Negative for adenopathy. Does not bruise/bleed easily.  Psychiatric/Behavioral: Negative for confusion, sleep disturbance and agitation. The patient is not nervous/anxious.        Objective:   Physical Exam  Filed Vitals:   11/10/12 1547  BP: 118/72  Pulse: 104  Temp: 97 F (36.1 C)  TempSrc: Oral  Height: 5' 2.5" (1.588 m)  Weight: 112 lb 6.4 oz (50.984 kg)  SpO2: 96%    Gen: Pleasant, well-nourished, in no distress,  normal affect  ENT: No lesions,  mouth clear,  oropharynx clear, no postnasal drip  Neck: No JVD, no TMG, no carotid bruits  Lungs: No use of accessory muscles, no dullness to percussion, distant BS  Cardiovascular: RRR, heart sounds normal, no murmur or gallops, no peripheral edema  Abdomen: soft and NT, no HSM,  BS normal  Musculoskeletal: No deformities, no cyanosis or clubbing  Neuro: alert, non focal  Skin: Warm, no lesions or rashes  No results found.  Spirometry is repeated on 11/10/2012 and reveals FEV1 116% predicted FVC 117% predicted FEV1 FVC ratio 99% predicted all of which are improved  compared to previous values and are normal      Assessment & Plan:   No problem-specific assessment & plan notes found for this encounter.   Updated Medication List Outpatient Encounter Prescriptions as of 11/10/2012  Medication Sig Dispense Refill  . amLODipine (NORVASC) 5 MG tablet Take 5 mg by mouth daily.        Marland Kitchen aspirin 81 MG tablet Take 81 mg by mouth daily.      Marland Kitchen atorvastatin (LIPITOR) 40 MG tablet Take 40 mg by mouth daily.        . calcium carbonate (OS-CAL - DOSED IN MG OF ELEMENTAL CALCIUM) 1250 MG tablet Take 2 tablets by mouth daily.       Marland Kitchen  clobetasol cream (TEMOVATE) 0.05 % Apply topically 2 (two) times daily as needed.      Marland Kitchen levothyroxine (SYNTHROID, LEVOTHROID) 88 MCG tablet Take 88 mcg by mouth daily.      Marland Kitchen olmesartan (BENICAR) 40 MG tablet Take 40 mg by mouth daily.        Marland Kitchen omeprazole (PRILOSEC) 20 MG capsule Take 20 mg by mouth daily.        Marland Kitchen PARoxetine (PAXIL) 10 MG tablet Take 10 mg by mouth every morning.        . tiotropium (SPIRIVA HANDIHALER) 18 MCG inhalation capsule Place 1 capsule (18 mcg total) into inhaler and inhale daily.  30 capsule  6  . vitamin E 100 UNIT capsule Take 100 Units by mouth daily.      . Zoledronic Acid (RECLAST IV) Inject into the vein. Yearly      . Chlorpheniramine Tannate 8 MG TABS Take 1 tablet (8 mg total) by mouth at bedtime.  30 each  6   No facility-administered encounter medications on file as of 11/10/2012.

## 2012-11-10 NOTE — Assessment & Plan Note (Signed)
Moderate obstructive lung disease on the basis of COPD gold stage B. Now improved with Spiriva Plan Maintain Spiriva Return 6 months

## 2012-11-11 ENCOUNTER — Other Ambulatory Visit: Payer: Medicare Other

## 2012-11-12 ENCOUNTER — Ambulatory Visit
Admission: RE | Admit: 2012-11-12 | Discharge: 2012-11-12 | Disposition: A | Discharge status: Home / Self Care | Payer: Medicare Other | Source: Ambulatory Visit | Attending: Family Medicine | Admitting: Family Medicine

## 2012-11-12 DIAGNOSIS — M810 Age-related osteoporosis without current pathological fracture: Secondary | ICD-10-CM

## 2012-11-12 DIAGNOSIS — M949 Disorder of cartilage, unspecified: Secondary | ICD-10-CM | POA: Diagnosis not present

## 2012-11-12 DIAGNOSIS — M899 Disorder of bone, unspecified: Secondary | ICD-10-CM | POA: Diagnosis not present

## 2012-11-23 ENCOUNTER — Other Ambulatory Visit: Payer: Self-pay

## 2012-11-23 ENCOUNTER — Other Ambulatory Visit: Payer: Self-pay | Admitting: Family Medicine

## 2012-11-23 DIAGNOSIS — Z1231 Encounter for screening mammogram for malignant neoplasm of breast: Secondary | ICD-10-CM

## 2012-11-25 ENCOUNTER — Telehealth: Payer: Self-pay | Admitting: Critical Care Medicine

## 2012-11-25 MED ORDER — TIOTROPIUM BROMIDE MONOHYDRATE 18 MCG IN CAPS: 18.0000 ug | ORAL_CAPSULE | Freq: Every day | RESPIRATORY_TRACT | Status: DC

## 2012-11-25 NOTE — Telephone Encounter (Signed)
Pt is aware that samples are ready to be picked up and she also needed a refill sent to [facility]. This has been taken care of.

## 2012-11-30 DIAGNOSIS — S90129A Contusion of unspecified lesser toe(s) without damage to nail, initial encounter: Secondary | ICD-10-CM | POA: Diagnosis not present

## 2012-11-30 DIAGNOSIS — S9030XA Contusion of unspecified foot, initial encounter: Secondary | ICD-10-CM | POA: Diagnosis not present

## 2012-12-17 DIAGNOSIS — L129 Pemphigoid, unspecified: Secondary | ICD-10-CM | POA: Diagnosis not present

## 2013-01-03 ENCOUNTER — Ambulatory Visit
Admission: RE | Admit: 2013-01-03 | Discharge: 2013-01-03 | Disposition: A | Discharge status: Home / Self Care | Payer: Medicare Other | Source: Ambulatory Visit

## 2013-01-03 DIAGNOSIS — Z1231 Encounter for screening mammogram for malignant neoplasm of breast: Secondary | ICD-10-CM | POA: Diagnosis not present

## 2013-01-31 DIAGNOSIS — D485 Neoplasm of uncertain behavior of skin: Secondary | ICD-10-CM | POA: Diagnosis not present

## 2013-03-01 DIAGNOSIS — E039 Hypothyroidism, unspecified: Secondary | ICD-10-CM | POA: Diagnosis not present

## 2013-04-08 DIAGNOSIS — E039 Hypothyroidism, unspecified: Secondary | ICD-10-CM | POA: Diagnosis not present

## 2013-04-26 DIAGNOSIS — E785 Hyperlipidemia, unspecified: Secondary | ICD-10-CM | POA: Diagnosis not present

## 2013-04-26 DIAGNOSIS — F411 Generalized anxiety disorder: Secondary | ICD-10-CM | POA: Diagnosis not present

## 2013-04-26 DIAGNOSIS — I129 Hypertensive chronic kidney disease with stage 1 through stage 4 chronic kidney disease, or unspecified chronic kidney disease: Secondary | ICD-10-CM | POA: Diagnosis not present

## 2013-04-26 DIAGNOSIS — J309 Allergic rhinitis, unspecified: Secondary | ICD-10-CM | POA: Diagnosis not present

## 2013-04-26 DIAGNOSIS — E039 Hypothyroidism, unspecified: Secondary | ICD-10-CM | POA: Diagnosis not present

## 2013-04-26 DIAGNOSIS — K219 Gastro-esophageal reflux disease without esophagitis: Secondary | ICD-10-CM | POA: Diagnosis not present

## 2013-04-26 DIAGNOSIS — R634 Abnormal weight loss: Secondary | ICD-10-CM | POA: Diagnosis not present

## 2013-04-27 ENCOUNTER — Other Ambulatory Visit: Payer: Self-pay

## 2013-05-17 DIAGNOSIS — Z23 Encounter for immunization: Secondary | ICD-10-CM | POA: Diagnosis not present

## 2013-05-20 ENCOUNTER — Encounter: Payer: Self-pay | Admitting: Critical Care Medicine

## 2013-05-20 ENCOUNTER — Ambulatory Visit (INDEPENDENT_AMBULATORY_CARE_PROVIDER_SITE_OTHER): Payer: Medicare Other | Admitting: Critical Care Medicine

## 2013-05-20 VITALS — BP 142/74 | HR 88 | Temp 97.4°F | Ht 62.5 in | Wt 110.4 lb

## 2013-05-20 DIAGNOSIS — J449 Chronic obstructive pulmonary disease, unspecified: Secondary | ICD-10-CM

## 2013-05-20 MED ORDER — TIOTROPIUM BROMIDE MONOHYDRATE 18 MCG IN CAPS: 18.0000 ug | ORAL_CAPSULE | Freq: Every day | RESPIRATORY_TRACT | Status: DC

## 2013-05-20 NOTE — Patient Instructions (Addendum)
No change in spiriva , use daily Follow reflux diet Return 6 month Remember to get flu vaccine

## 2013-05-21 NOTE — Assessment & Plan Note (Addendum)
Marked improvement in Copd symptoms with spiriva Plan  cont spiriva Return 6 months

## 2013-05-21 NOTE — Progress Notes (Signed)
Subjective:    Patient ID: Erin Ryan, female    DOB: 1925/02/03, 79 y.o.   MRN: [id number]  HPI   79 y.o.WF  08/27/2012 Pt not seen in 2yrs. Prior hx of GERD, VCD, microaspiration with lower airway inflammation. No  Obstruction on prior pfts. Pt notes progressive dyspnea with any exertion.  Not abele to do much , walk to car and is out of breath. ++passive smoke exposure Not much cough. Gets phlegm and is clear. Stays hoarse.  Pt notes sinus pressure.   No real wheeze.  No heartburn.  No dysphagia.  No cough or choke with food. No chest pain.  Pt feels tight in the chest.  No edema in feet.  11/10/2012 Pt on spiriva.  Feels is better. Occ will cough up some phlegm.  Nose is stopped up. Uses saline nasal and it helps.  No real chest pain. Does get tight in the chest . Pt denies any significant sore throat, nasal congestion or excess secretions, fever, chills, sweats, unintended weight loss, pleurtic or exertional chest pain, orthopnea PND, or leg swelling Pt denies any increase in rescue therapy over baseline, denies waking up needing it or having any early am or nocturnal exacerbations of coughing/wheezing/or dyspnea. Pt also denies any obvious fluctuation in symptoms with  weather or environmental change or other alleviating or aggravating factors  8/29 Since last ov doing well. No acute dyspnea . Better walk distance Pt denies any significant sore throat, nasal congestion or excess secretions, fever, chills, sweats, unintended weight loss, pleurtic or exertional chest pain, orthopnea PND, or leg swelling Pt denies any increase in rescue therapy over baseline, denies waking up needing it or having any early am or nocturnal exacerbations of coughing/wheezing/or dyspnea. Pt also denies any obvious fluctuation in symptoms with  weather or environmental change or other alleviating or aggravating factors    Past Medical History  Diagnosis Date  . Osteoporosis   . Hypertension   . High  cholesterol   . GERD (gastroesophageal reflux disease)   . Arthritis   . Thyroid disease   . COPD (chronic obstructive pulmonary disease)     emphysema     Family History  Problem Relation Age of Onset  . Heart disease Mother   . Heart disease Father   . Asthma Father   . Bladder Cancer Mother      History   Social History  . Marital Status: Widowed    Spouse Name: N/A    Number of Children: N/A  . Years of Education: N/A   Occupational History  . Retired     Metallurgist   Social History Main Topics  . Smoking status: Never Smoker   . Smokeless tobacco: Never Used  . Alcohol Use: No  . Drug Use: No  . Sexual Activity: Not on file   Other Topics Concern  . Not on file   Social History Narrative  . No narrative on file     Allergies  Allergen Reactions  . Hydrochlorothiazide     hyponatremia     Outpatient Prescriptions Prior to Visit  Medication Sig Dispense Refill  . amLODipine (NORVASC) 5 MG tablet Take 5 mg by mouth daily.        Marland Kitchen aspirin 81 MG tablet Take 81 mg by mouth daily.      Marland Kitchen atorvastatin (LIPITOR) 40 MG tablet Take 40 mg by mouth daily.        . calcium carbonate (OS-CAL - DOSED IN MG  OF ELEMENTAL CALCIUM) 1250 MG tablet Take 2 tablets by mouth daily.       . Chlorpheniramine Tannate 8 MG TABS Take 1 tablet (8 mg total) by mouth at bedtime.  30 each  6  . clobetasol cream (TEMOVATE) 0.05 % Apply topically 2 (two) times daily as needed.      Marland Kitchen olmesartan (BENICAR) 40 MG tablet Take 40 mg by mouth daily.        Marland Kitchen omeprazole (PRILOSEC) 20 MG capsule Take 20 mg by mouth daily.        Marland Kitchen PARoxetine (PAXIL) 10 MG tablet Take 10 mg by mouth every morning.        . vitamin E 100 UNIT capsule Take 100 Units by mouth daily.      . Zoledronic Acid (RECLAST IV) Inject into the vein. Yearly      . tiotropium (SPIRIVA HANDIHALER) 18 MCG inhalation capsule Place 1 capsule (18 mcg total) into inhaler and inhale daily.  90 capsule  1  . levothyroxine  (SYNTHROID, LEVOTHROID) 88 MCG tablet Take 88 mcg by mouth daily.       No facility-administered medications prior to visit.       Review of Systems  Constitutional: Positive for activity change, fatigue and unexpected weight change. Negative for fever, chills, diaphoresis and appetite change.  HENT: Positive for hearing loss and voice change. Negative for ear pain, nosebleeds, congestion, sore throat, facial swelling, rhinorrhea, sneezing, mouth sores, trouble swallowing, neck pain, neck stiffness, dental problem, postnasal drip, sinus pressure, tinnitus and ear discharge.   Eyes: Positive for itching. Negative for photophobia, discharge and visual disturbance.  Respiratory: Negative for apnea, cough, choking, chest tightness, shortness of breath, wheezing and stridor.   Cardiovascular: Negative for chest pain, palpitations and leg swelling.  Gastrointestinal: Negative for nausea, vomiting, abdominal pain, constipation, blood in stool and abdominal distention.  Genitourinary: Negative for dysuria, urgency, frequency, hematuria, flank pain, decreased urine volume and difficulty urinating.  Musculoskeletal: Positive for joint swelling and gait problem. Negative for myalgias, back pain and arthralgias.  Skin: Negative for color change, pallor and rash.  Neurological: Positive for light-headedness. Negative for dizziness, tremors, seizures, syncope, speech difficulty, weakness, numbness and headaches.  Hematological: Negative for adenopathy. Does not bruise/bleed easily.  Psychiatric/Behavioral: Negative for confusion, sleep disturbance and agitation. The patient is not nervous/anxious.        Objective:   Physical Exam  Filed Vitals:   05/20/13 0847  BP: 142/74  Pulse: 88  Temp: 97.4 F (36.3 C)  TempSrc: Oral  Height: 5' 2.5" (1.588 m)  Weight: 110 lb 6.4 oz (50.077 kg)  SpO2: 100%    Gen: Pleasant, well-nourished, in no distress,  normal affect  ENT: No lesions,  mouth clear,   oropharynx clear, no postnasal drip  Neck: No JVD, no TMG, no carotid bruits  Lungs: No use of accessory muscles, no dullness to percussion, distant BS  Cardiovascular: RRR, heart sounds normal, no murmur or gallops, no peripheral edema  Abdomen: soft and NT, no HSM,  BS normal  Musculoskeletal: No deformities, no cyanosis or clubbing  Neuro: alert, non focal  Skin: Warm, no lesions or rashes  No results found.  Spirometry is repeated on 11/10/2012 and reveals FEV1 116% predicted FVC 117% predicted FEV1 FVC ratio 99% predicted all of which are improved compared to previous values and are normal      Assessment & Plan:   Mild COPD primary emphysematous component, small airway obstruction Marked improvement in  Copd symptoms with spiriva Plan  cont spiriva Return 6 months    Updated Medication List Outpatient Encounter Prescriptions as of 05/20/2013  Medication Sig Dispense Refill  . amLODipine (NORVASC) 5 MG tablet Take 5 mg by mouth daily.        Marland Kitchen aspirin 81 MG tablet Take 81 mg by mouth daily.      Marland Kitchen atorvastatin (LIPITOR) 40 MG tablet Take 40 mg by mouth daily.        . calcium carbonate (OS-CAL - DOSED IN MG OF ELEMENTAL CALCIUM) 1250 MG tablet Take 2 tablets by mouth daily.       . Chlorpheniramine Tannate 8 MG TABS Take 1 tablet (8 mg total) by mouth at bedtime.  30 each  6  . clobetasol cream (TEMOVATE) 0.05 % Apply topically 2 (two) times daily as needed.      Marland Kitchen levothyroxine (SYNTHROID, LEVOTHROID) 75 MCG tablet Take 75 mcg by mouth daily.      Marland Kitchen olmesartan (BENICAR) 40 MG tablet Take 40 mg by mouth daily.        Marland Kitchen omeprazole (PRILOSEC) 20 MG capsule Take 20 mg by mouth daily.        Marland Kitchen PARoxetine (PAXIL) 10 MG tablet Take 10 mg by mouth every morning.        . tiotropium (SPIRIVA HANDIHALER) 18 MCG inhalation capsule Place 1 capsule (18 mcg total) into inhaler and inhale daily.  90 capsule  4  . vitamin E 100 UNIT capsule Take 100 Units by mouth daily.      .  Zoledronic Acid (RECLAST IV) Inject into the vein. Yearly      . [DISCONTINUED] tiotropium (SPIRIVA HANDIHALER) 18 MCG inhalation capsule Place 1 capsule (18 mcg total) into inhaler and inhale daily.  90 capsule  1  . [DISCONTINUED] levothyroxine (SYNTHROID, LEVOTHROID) 88 MCG tablet Take 88 mcg by mouth daily.       No facility-administered encounter medications on file as of 05/20/2013.

## 2013-06-21 ENCOUNTER — Telehealth: Payer: Self-pay | Admitting: Critical Care Medicine

## 2013-06-21 MED ORDER — TIOTROPIUM BROMIDE MONOHYDRATE 18 MCG IN CAPS: 18.0000 ug | ORAL_CAPSULE | Freq: Every day | RESPIRATORY_TRACT | Status: DC

## 2013-06-21 NOTE — Telephone Encounter (Signed)
Spoke with patient, patient requesting sample of spiriva Sample(s) placed upfront and patient is aware Nothing further needed at this time.

## 2013-06-28 DIAGNOSIS — L129 Pemphigoid, unspecified: Secondary | ICD-10-CM | POA: Diagnosis not present

## 2013-08-01 ENCOUNTER — Telehealth: Payer: Self-pay | Admitting: Critical Care Medicine

## 2013-08-01 NOTE — Telephone Encounter (Signed)
Samples are up front for pick up. Pt is aware. 

## 2013-08-17 DIAGNOSIS — H04129 Dry eye syndrome of unspecified lacrimal gland: Secondary | ICD-10-CM | POA: Diagnosis not present

## 2013-08-25 ENCOUNTER — Other Ambulatory Visit: Payer: Self-pay | Admitting: Family Medicine

## 2013-09-08 DIAGNOSIS — E039 Hypothyroidism, unspecified: Secondary | ICD-10-CM | POA: Diagnosis not present

## 2013-09-08 DIAGNOSIS — R946 Abnormal results of thyroid function studies: Secondary | ICD-10-CM | POA: Diagnosis not present

## 2013-09-08 DIAGNOSIS — M810 Age-related osteoporosis without current pathological fracture: Secondary | ICD-10-CM | POA: Diagnosis not present

## 2013-10-04 ENCOUNTER — Encounter (HOSPITAL_COMMUNITY): Payer: Self-pay

## 2013-10-04 ENCOUNTER — Other Ambulatory Visit (HOSPITAL_COMMUNITY): Payer: Self-pay | Admitting: Family Medicine

## 2013-10-04 ENCOUNTER — Ambulatory Visit (HOSPITAL_COMMUNITY)
Admission: RE | Admit: 2013-10-04 | Discharge: 2013-10-04 | Disposition: A | Discharge status: Home / Self Care | Payer: Medicare Other | Source: Ambulatory Visit | Attending: Family Medicine | Admitting: Family Medicine

## 2013-10-04 DIAGNOSIS — M810 Age-related osteoporosis without current pathological fracture: Secondary | ICD-10-CM | POA: Diagnosis not present

## 2013-10-04 HISTORY — DX: Age-related osteoporosis without current pathological fracture: M81.0

## 2013-10-04 MED ORDER — ZOLEDRONIC ACID 5 MG/100ML IV SOLN
5.0000 mg | Freq: Once | INTRAVENOUS | Status: AC
  Administered 2013-10-04: 5 mg via INTRAVENOUS
  Filled 2013-10-04: qty 100

## 2013-10-04 MED ORDER — SODIUM CHLORIDE 0.9 % IV SOLN
250.0000 mL | Freq: Once | INTRAVENOUS | Status: AC
  Administered 2013-10-04: 250 mL via INTRAVENOUS

## 2013-10-04 NOTE — Discharge Instructions (Signed)
Drink fluids/water as tolerated over next 72hrs °Tylenol or Ibuprofen OTC as directed °Continue calcium and Vit D as directed by your MDZoledronic Acid injection (Paget's Disease, Osteoporosis) °What is this medicine? °ZOLEDRONIC ACID (ZOE le dron ik AS id) lowers the amount of calcium loss from bone. It is used to treat Paget's disease and osteoporosis in women. °This medicine may be used for other purposes; ask your health care provider or pharmacist if you have questions. °COMMON BRAND NAME(S): Reclast, Zometa °What should I tell my health care provider before I take this medicine? °They need to know if you have any of these conditions: °-aspirin-sensitive asthma °-cancer, especially if you are receiving medicines used to treat cancer °-dental disease or wear dentures °-infection °-kidney disease °-low levels of calcium in the blood °-past surgery on the parathyroid gland or intestines °-receiving corticosteroids like dexamethasone or prednisone °-an unusual or allergic reaction to zoledronic acid, other medicines, foods, dyes, or preservatives °-pregnant or trying to get pregnant °-breast-feeding °How should I use this medicine? °This medicine is for infusion into a vein. It is given by a health care professional in a hospital or clinic setting. °Talk to your pediatrician regarding the use of this medicine in children. This medicine is not approved for use in children. °Overdosage: If you think you have taken too much of this medicine contact a poison control center or emergency room at once. °NOTE: This medicine is only for you. Do not share this medicine with others. °What if I miss a dose? °It is important not to miss your dose. Call your doctor or health care professional if you are unable to keep an appointment. °What may interact with this medicine? °-certain antibiotics given by injection °-NSAIDs, medicines for pain and inflammation, like ibuprofen or naproxen °-some diuretics like bumetanide,  furosemide °-teriparatide °This list may not describe all possible interactions. Give your health care provider a list of all the medicines, herbs, non-prescription drugs, or dietary supplements you use. Also tell them if you smoke, drink alcohol, or use illegal drugs. Some items may interact with your medicine. °What should I watch for while using this medicine? °Visit your doctor or health care professional for regular checkups. It may be some time before you see the benefit from this medicine. Do not stop taking your medicine unless your doctor tells you to. Your doctor may order blood tests or other tests to see how you are doing. °Women should inform their doctor if they wish to become pregnant or think they might be pregnant. There is a potential for serious side effects to an unborn child. Talk to your health care professional or pharmacist for more information. °You should make sure that you get enough calcium and vitamin D while you are taking this medicine. Discuss the foods you eat and the vitamins you take with your health care professional. °Some people who take this medicine have severe bone, joint, and/or muscle pain. This medicine may also increase your risk for jaw problems or a broken thigh bone. Tell your doctor right away if you have severe pain in your jaw, bones, joints, or muscles. Tell your doctor if you have any pain that does not go away or that gets worse. °Tell your dentist and dental surgeon that you are taking this medicine. You should not have major dental surgery while on this medicine. See your dentist to have a dental exam and fix any dental problems before starting this medicine. Take good care of your teeth while on   this medicine. Make sure you see your dentist for regular follow-up appointments. °What side effects may I notice from receiving this medicine? °Side effects that you should report to your doctor or health care professional as soon as possible: °-allergic reactions  like skin rash, itching or hives, swelling of the face, lips, or tongue °-anxiety, confusion, or depression °-breathing problems °-changes in vision °-eye pain °-feeling faint or lightheaded, falls °-jaw pain, especially after dental work °-mouth sores °-muscle cramps, stiffness, or weakness °-trouble passing urine or change in the amount of urine °Side effects that usually do not require medical attention (report to your doctor or health care professional if they continue or are bothersome): °-bone, joint, or muscle pain °-constipation °-diarrhea °-fever °-hair loss °-irritation at site where injected °-loss of appetite °-nausea, vomiting °-stomach upset °-trouble sleeping °-trouble swallowing °-weak or tired °This list may not describe all possible side effects. Call your doctor for medical advice about side effects. You may report side effects to FDA at 1-800-FDA-1088. °Where should I keep my medicine? °This drug is given in a hospital or clinic and will not be stored at home. °NOTE: This sheet is a summary. It may not cover all possible information. If you have questions about this medicine, talk to your doctor, pharmacist, or health care provider. °© 2014, [employer]. (2013-02-21 10:03:48) ° °

## 2013-10-28 DIAGNOSIS — E785 Hyperlipidemia, unspecified: Secondary | ICD-10-CM | POA: Diagnosis not present

## 2013-10-28 DIAGNOSIS — I129 Hypertensive chronic kidney disease with stage 1 through stage 4 chronic kidney disease, or unspecified chronic kidney disease: Secondary | ICD-10-CM | POA: Diagnosis not present

## 2013-10-28 DIAGNOSIS — N183 Chronic kidney disease, stage 3 unspecified: Secondary | ICD-10-CM | POA: Diagnosis not present

## 2013-10-28 DIAGNOSIS — E039 Hypothyroidism, unspecified: Secondary | ICD-10-CM | POA: Diagnosis not present

## 2013-10-28 DIAGNOSIS — K219 Gastro-esophageal reflux disease without esophagitis: Secondary | ICD-10-CM | POA: Diagnosis not present

## 2013-10-28 DIAGNOSIS — F411 Generalized anxiety disorder: Secondary | ICD-10-CM | POA: Diagnosis not present

## 2013-11-28 ENCOUNTER — Other Ambulatory Visit: Payer: Self-pay

## 2013-11-28 DIAGNOSIS — Z1231 Encounter for screening mammogram for malignant neoplasm of breast: Secondary | ICD-10-CM

## 2013-12-26 ENCOUNTER — Telehealth: Payer: Self-pay | Admitting: Critical Care Medicine

## 2013-12-26 MED ORDER — TIOTROPIUM BROMIDE MONOHYDRATE 18 MCG IN CAPS: 18.0000 ug | ORAL_CAPSULE | Freq: Every day | RESPIRATORY_TRACT | Status: DC

## 2013-12-26 NOTE — Telephone Encounter (Signed)
Called spoke with pt. Aware spiriva left for pick up in [place] office.

## 2014-01-06 ENCOUNTER — Ambulatory Visit
Admission: RE | Admit: 2014-01-06 | Discharge: 2014-01-06 | Disposition: A | Discharge status: Home / Self Care | Payer: Medicare Other | Source: Ambulatory Visit

## 2014-01-06 DIAGNOSIS — Z1231 Encounter for screening mammogram for malignant neoplasm of breast: Secondary | ICD-10-CM | POA: Diagnosis not present

## 2014-01-25 ENCOUNTER — Encounter: Payer: Self-pay | Admitting: Critical Care Medicine

## 2014-01-25 ENCOUNTER — Ambulatory Visit (INDEPENDENT_AMBULATORY_CARE_PROVIDER_SITE_OTHER): Payer: Medicare Other | Admitting: Critical Care Medicine

## 2014-01-25 VITALS — BP 112/60 | HR 88 | Temp 96.7°F | Ht 62.0 in | Wt 110.8 lb

## 2014-01-25 DIAGNOSIS — J449 Chronic obstructive pulmonary disease, unspecified: Secondary | ICD-10-CM

## 2014-01-25 DIAGNOSIS — J441 Chronic obstructive pulmonary disease with (acute) exacerbation: Secondary | ICD-10-CM

## 2014-01-25 MED ORDER — PREDNISONE 10 MG PO TABS: ORAL_TABLET | ORAL | Status: DC

## 2014-01-25 MED ORDER — TIOTROPIUM BROMIDE MONOHYDRATE 18 MCG IN CAPS: 18.0000 ug | ORAL_CAPSULE | Freq: Every day | RESPIRATORY_TRACT | Status: DC

## 2014-01-25 NOTE — Patient Instructions (Signed)
Take prednisone 10mg  : two daily for 7 days then stop Refill on spiriva sent to mail order  Breathing test scheduled Return 4 months

## 2014-01-25 NOTE — Progress Notes (Signed)
Subjective:    Patient ID: Erin Ryan, female    DOB: [birth year], 80 y.o.   MRN: [id number]  HPI   80 y.o.WF  01/25/2014 Chief Complaint  Patient presents with  . Follow-up    Has noticed increased SOB when walking x 2 wks to 1 month.  Dry cough.  No wheezing or chest tightness/pain.  Pt notes more DOE if walking.  Notes a dry cough.  Worse over one month    No chest pain or wheeze.  No real mucus. No nasal drainage, voice is hoarse Pt denies any significant sore throat, nasal congestion or excess secretions, fever, chills, sweats, unintended weight loss, pleurtic or exertional chest pain, orthopnea PND, or leg swelling Pt denies any increase in rescue therapy over baseline, denies waking up needing it or having any early am or nocturnal exacerbations of coughing/wheezing/or dyspnea. Pt also denies any obvious fluctuation in symptoms with  weather or environmental change or other alleviating or aggravating factors   Review of Systems  Constitutional: Positive for activity change, fatigue and unexpected weight change. Negative for fever, chills, diaphoresis and appetite change.  HENT: Positive for hearing loss and voice change. Negative for congestion, dental problem, ear discharge, ear pain, facial swelling, mouth sores, nosebleeds, postnasal drip, rhinorrhea, sinus pressure, sneezing, sore throat, tinnitus and trouble swallowing.   Eyes: Positive for itching. Negative for photophobia, discharge and visual disturbance.  Respiratory: Positive for chest tightness and shortness of breath. Negative for apnea, cough, choking, wheezing and stridor.   Cardiovascular: Negative for chest pain, palpitations and leg swelling.  Gastrointestinal: Negative for nausea, vomiting, abdominal pain, constipation, blood in stool and abdominal distention.  Genitourinary: Negative for dysuria, urgency, frequency, hematuria, flank pain, decreased urine volume and difficulty urinating.  Musculoskeletal: Positive  for gait problem and joint swelling. Negative for arthralgias, back pain, myalgias, neck pain and neck stiffness.  Skin: Negative for color change, pallor and rash.  Neurological: Positive for light-headedness. Negative for dizziness, tremors, seizures, syncope, speech difficulty, weakness, numbness and headaches.  Hematological: Negative for adenopathy. Does not bruise/bleed easily.  Psychiatric/Behavioral: Negative for confusion, sleep disturbance and agitation. The patient is not nervous/anxious.        Objective:   Physical Exam  Filed Vitals:   01/25/14 1101  BP: 112/60  Pulse: 88  Temp: 96.7 F (35.9 C)  TempSrc: Oral  Height: 5\' 2"  (1.575 m)  Weight: 110 lb 12.8 oz (50.259 kg)  SpO2: 100%    Gen: Pleasant, well-nourished, in no distress,  normal affect  ENT: No lesions,  mouth clear,  oropharynx clear, no postnasal drip  Neck: No JVD, no TMG, no carotid bruits  Lungs: No use of accessory muscles, no dullness to percussion, distant BS  Cardiovascular: RRR, heart sounds normal, no murmur or gallops, no peripheral edema  Abdomen: soft and NT, no HSM,  BS normal  Musculoskeletal: No deformities, no cyanosis or clubbing  Neuro: alert, non focal  Skin: Warm, no lesions or rashes  No results found.      Assessment & Plan:   Mild COPD primary emphysematous component, small airway obstruction Mild COPD with primary emphysematous component and small airway obstruction Poor response to bronchodilator therapy  Plan Pulse prednisone Maintain Spiriva    Updated Medication List Outpatient Encounter Prescriptions as of 01/25/2014  Medication Sig  . amLODipine (NORVASC) 5 MG tablet Take 5 mg by mouth daily.    Marland Kitchen aspirin 81 MG tablet Take 81 mg by mouth daily.  Marland Kitchen atorvastatin (LIPITOR)  40 MG tablet Take 40 mg by mouth daily.    . calcium carbonate (OS-CAL - DOSED IN MG OF ELEMENTAL CALCIUM) 1250 MG tablet Take 2 tablets by mouth daily.   . Chlorpheniramine Tannate 8  MG TABS Take 1 tablet (8 mg total) by mouth at bedtime.  . clobetasol cream (TEMOVATE) 0.05 % Apply topically 2 (two) times daily as needed.  Marland Kitchen levothyroxine (SYNTHROID, LEVOTHROID) 88 MCG tablet Take 88 mcg by mouth daily before breakfast.  . olmesartan (BENICAR) 40 MG tablet Take 40 mg by mouth daily.    Marland Kitchen omeprazole (PRILOSEC) 20 MG capsule Take 20 mg by mouth daily.    Marland Kitchen PARoxetine (PAXIL) 10 MG tablet Take 10 mg by mouth every morning.    . tiotropium (SPIRIVA HANDIHALER) 18 MCG inhalation capsule Place 1 capsule (18 mcg total) into inhaler and inhale daily.  . vitamin E 100 UNIT capsule Take 100 Units by mouth daily.  . Zoledronic Acid (RECLAST IV) Inject into the vein. Yearly  . [DISCONTINUED] tiotropium (SPIRIVA HANDIHALER) 18 MCG inhalation capsule Place 1 capsule (18 mcg total) into inhaler and inhale daily.  . predniSONE (DELTASONE) 10 MG tablet Take two daily until gone  . [DISCONTINUED] levothyroxine (SYNTHROID, LEVOTHROID) 75 MCG tablet Take 75 mcg by mouth daily.

## 2014-01-26 NOTE — Assessment & Plan Note (Addendum)
Mild COPD with primary emphysematous component and small airway obstruction Poor response to bronchodilator therapy  Plan Pulse prednisone Maintain Spiriva

## 2014-01-27 ENCOUNTER — Ambulatory Visit (INDEPENDENT_AMBULATORY_CARE_PROVIDER_SITE_OTHER): Payer: Medicare Other | Admitting: Critical Care Medicine

## 2014-01-27 DIAGNOSIS — J441 Chronic obstructive pulmonary disease with (acute) exacerbation: Secondary | ICD-10-CM

## 2014-01-27 NOTE — Progress Notes (Signed)
Quick Note:  Called, spoke with pt. Informed her of PFT results and recs per Dr. Wright. She verbalized understanding and voiced no further questions or concerns at this time. ______ 

## 2014-01-27 NOTE — Progress Notes (Signed)
PFT done today. 

## 2014-02-09 DIAGNOSIS — M25559 Pain in unspecified hip: Secondary | ICD-10-CM | POA: Insufficient documentation

## 2014-02-09 DIAGNOSIS — Z23 Encounter for immunization: Secondary | ICD-10-CM | POA: Diagnosis not present

## 2014-02-10 ENCOUNTER — Other Ambulatory Visit: Payer: Self-pay | Admitting: Family Medicine

## 2014-02-10 ENCOUNTER — Ambulatory Visit
Admission: RE | Admit: 2014-02-10 | Discharge: 2014-02-10 | Disposition: A | Discharge status: Home / Self Care | Payer: Medicare Other | Source: Ambulatory Visit | Attending: Family Medicine | Admitting: Family Medicine

## 2014-02-10 DIAGNOSIS — M1610 Unilateral primary osteoarthritis, unspecified hip: Secondary | ICD-10-CM | POA: Diagnosis not present

## 2014-02-10 DIAGNOSIS — M25559 Pain in unspecified hip: Secondary | ICD-10-CM

## 2014-02-10 DIAGNOSIS — M4310 Spondylolisthesis, site unspecified: Secondary | ICD-10-CM | POA: Diagnosis not present

## 2014-02-10 DIAGNOSIS — M169 Osteoarthritis of hip, unspecified: Secondary | ICD-10-CM | POA: Diagnosis not present

## 2014-02-10 DIAGNOSIS — M47817 Spondylosis without myelopathy or radiculopathy, lumbosacral region: Secondary | ICD-10-CM | POA: Diagnosis not present

## 2014-02-10 DIAGNOSIS — M5137 Other intervertebral disc degeneration, lumbosacral region: Secondary | ICD-10-CM | POA: Diagnosis not present

## 2014-02-17 LAB — PULMONARY FUNCTION TEST
DL/VA % PRED: 71 %
DL/VA: 3.16 ml/min/mmHg/L
DLCO UNC: 13.84 ml/min/mmHg
DLCO unc % pred: 68 %
FEF 25-75 Post: 1.63 L/sec
FEF 25-75 Pre: 1.46 L/sec
FEF2575-%Change-Post: 11 %
FEF2575-%PRED-PRE: 166 %
FEF2575-%Pred-Post: 185 %
FEV1-%Change-Post: 3 %
FEV1-%Pred-Post: 145 %
FEV1-%Pred-Pre: 141 %
FEV1-POST: 2.06 L
FEV1-Pre: 1.99 L
FEV1FVC-%CHANGE-POST: 3 %
FEV1FVC-%Pred-Pre: 102 %
FEV6-%CHANGE-POST: 0 %
FEV6-%PRED-POST: 148 %
FEV6-%PRED-PRE: 149 %
FEV6-POST: 2.68 L
FEV6-PRE: 2.69 L
FEV6FVC-%Change-Post: 0 %
FEV6FVC-%PRED-POST: 107 %
FEV6FVC-%Pred-Pre: 107 %
FVC-%Change-Post: 0 %
FVC-%PRED-POST: 138 %
FVC-%PRED-PRE: 138 %
FVC-POST: 2.69 L
FVC-PRE: 2.69 L
POST FEV6/FVC RATIO: 100 %
Post FEV1/FVC ratio: 77 %
Pre FEV1/FVC ratio: 74 %
Pre FEV6/FVC Ratio: 100 %
RV % pred: 85 %
RV: 2.04 L
TLC % PRED: 103 %
TLC: 4.74 L

## 2014-04-17 DIAGNOSIS — H6120 Impacted cerumen, unspecified ear: Secondary | ICD-10-CM | POA: Diagnosis not present

## 2014-04-17 DIAGNOSIS — H902 Conductive hearing loss, unspecified: Secondary | ICD-10-CM | POA: Diagnosis not present

## 2014-04-17 DIAGNOSIS — H60399 Other infective otitis externa, unspecified ear: Secondary | ICD-10-CM | POA: Diagnosis not present

## 2014-05-10 DIAGNOSIS — I129 Hypertensive chronic kidney disease with stage 1 through stage 4 chronic kidney disease, or unspecified chronic kidney disease: Secondary | ICD-10-CM | POA: Diagnosis not present

## 2014-05-10 DIAGNOSIS — Z79899 Other long term (current) drug therapy: Secondary | ICD-10-CM | POA: Diagnosis not present

## 2014-05-10 DIAGNOSIS — M810 Age-related osteoporosis without current pathological fracture: Secondary | ICD-10-CM | POA: Diagnosis not present

## 2014-05-10 DIAGNOSIS — E785 Hyperlipidemia, unspecified: Secondary | ICD-10-CM | POA: Diagnosis not present

## 2014-05-10 DIAGNOSIS — N183 Chronic kidney disease, stage 3 unspecified: Secondary | ICD-10-CM | POA: Diagnosis not present

## 2014-05-10 DIAGNOSIS — F411 Generalized anxiety disorder: Secondary | ICD-10-CM | POA: Diagnosis not present

## 2014-05-10 DIAGNOSIS — Z0000 Encounter for general adult medical examination without abnormal findings: Secondary | ICD-10-CM | POA: Diagnosis not present

## 2014-05-10 DIAGNOSIS — Z23 Encounter for immunization: Secondary | ICD-10-CM | POA: Diagnosis not present

## 2014-05-10 DIAGNOSIS — E039 Hypothyroidism, unspecified: Secondary | ICD-10-CM | POA: Diagnosis not present

## 2014-05-10 DIAGNOSIS — K219 Gastro-esophageal reflux disease without esophagitis: Secondary | ICD-10-CM | POA: Diagnosis not present

## 2014-05-18 DIAGNOSIS — E871 Hypo-osmolality and hyponatremia: Secondary | ICD-10-CM | POA: Diagnosis not present

## 2014-06-26 ENCOUNTER — Encounter: Payer: Self-pay | Admitting: Critical Care Medicine

## 2014-06-26 ENCOUNTER — Ambulatory Visit (INDEPENDENT_AMBULATORY_CARE_PROVIDER_SITE_OTHER): Payer: Medicare Other | Admitting: Critical Care Medicine

## 2014-06-26 VITALS — BP 120/64 | HR 83 | Temp 97.1°F | Ht 62.0 in | Wt 111.4 lb

## 2014-06-26 DIAGNOSIS — E039 Hypothyroidism, unspecified: Secondary | ICD-10-CM | POA: Insufficient documentation

## 2014-06-26 DIAGNOSIS — E785 Hyperlipidemia, unspecified: Secondary | ICD-10-CM | POA: Insufficient documentation

## 2014-06-26 DIAGNOSIS — M792 Neuralgia and neuritis, unspecified: Secondary | ICD-10-CM | POA: Insufficient documentation

## 2014-06-26 DIAGNOSIS — J432 Centrilobular emphysema: Secondary | ICD-10-CM

## 2014-06-26 DIAGNOSIS — F411 Generalized anxiety disorder: Secondary | ICD-10-CM | POA: Insufficient documentation

## 2014-06-26 DIAGNOSIS — M810 Age-related osteoporosis without current pathological fracture: Secondary | ICD-10-CM | POA: Insufficient documentation

## 2014-06-26 DIAGNOSIS — I129 Hypertensive chronic kidney disease with stage 1 through stage 4 chronic kidney disease, or unspecified chronic kidney disease: Secondary | ICD-10-CM | POA: Insufficient documentation

## 2014-06-26 DIAGNOSIS — N1832 Chronic kidney disease, stage 3b: Secondary | ICD-10-CM | POA: Insufficient documentation

## 2014-06-26 DIAGNOSIS — N183 Chronic kidney disease, stage 3 unspecified: Secondary | ICD-10-CM | POA: Insufficient documentation

## 2014-06-26 DIAGNOSIS — K219 Gastro-esophageal reflux disease without esophagitis: Secondary | ICD-10-CM | POA: Insufficient documentation

## 2014-06-26 NOTE — Patient Instructions (Signed)
No change in medications. Return in        6 months Samples of spiriva given

## 2014-06-26 NOTE — Assessment & Plan Note (Signed)
COPD gold stage A with primary emphysematous component and air trapping Improved with Spiriva Plan Maintain Spiriva daily Note patient's influenza and pneumococcal vaccines have been updated

## 2014-06-26 NOTE — Progress Notes (Signed)
Subjective:    Patient ID: Erin Ryan, female    DOB: [birth year], 80 y.o.   MRN: [id number]  HPI   80 y.o.WF  06/26/2014 Chief Complaint  Patient presents with  . 4 month follow up    DOE is unchanged.  Has occas cough.  No wheezing or chest tightness/pain.  Dyspnea the same. No worse. occ cough. Notes some pndrip.   Pt denies any significant sore throat, nasal congestion or excess secretions, fever, chills, sweats, unintended weight loss, pleurtic or exertional chest pain, orthopnea PND, or leg swelling Pt denies any increase in rescue therapy over baseline, denies waking up needing it or having any early am or nocturnal exacerbations of coughing/wheezing/or dyspnea. Pt also denies any obvious fluctuation in symptoms with  weather or environmental change or other alleviating or aggravating factors    Review of Systems  Constitutional: Positive for activity change, fatigue and unexpected weight change. Negative for fever, chills, diaphoresis and appetite change.  HENT: Positive for hearing loss and voice change. Negative for congestion, dental problem, ear discharge, ear pain, facial swelling, mouth sores, nosebleeds, postnasal drip, rhinorrhea, sinus pressure, sneezing, sore throat, tinnitus and trouble swallowing.   Eyes: Positive for itching. Negative for photophobia, discharge and visual disturbance.  Respiratory: Positive for chest tightness and shortness of breath. Negative for apnea, cough, choking, wheezing and stridor.   Cardiovascular: Negative for chest pain, palpitations and leg swelling.  Gastrointestinal: Negative for nausea, vomiting, abdominal pain, constipation, blood in stool and abdominal distention.  Genitourinary: Negative for dysuria, urgency, frequency, hematuria, flank pain, decreased urine volume and difficulty urinating.  Musculoskeletal: Positive for gait problem and joint swelling. Negative for arthralgias, back pain, myalgias, neck pain and neck stiffness.   Skin: Negative for color change, pallor and rash.  Neurological: Positive for light-headedness. Negative for dizziness, tremors, seizures, syncope, speech difficulty, weakness, numbness and headaches.  Hematological: Negative for adenopathy. Does not bruise/bleed easily.  Psychiatric/Behavioral: Negative for confusion, sleep disturbance and agitation. The patient is not nervous/anxious.        Objective:   Physical Exam  Filed Vitals:   06/26/14 1105  BP: 120/64  Pulse: 83  Temp: 97.1 F (36.2 C)  TempSrc: Oral  Height: 5\' 2"  (1.575 m)  Weight: 111 lb 6.4 oz (50.531 kg)  SpO2: 99%    Gen: Pleasant, well-nourished, in no distress,  normal affect  ENT: No lesions,  mouth clear,  oropharynx clear, no postnasal drip  Neck: No JVD, no TMG, no carotid bruits  Lungs: No use of accessory muscles, no dullness to percussion, distant BS  Cardiovascular: RRR, heart sounds normal, no murmur or gallops, no peripheral edema  Abdomen: soft and NT, no HSM,  BS normal  Musculoskeletal: No deformities, no cyanosis or clubbing  Neuro: alert, non focal  Skin: Warm, no lesions or rashes  No results found.      Assessment & Plan:   COPD with emphysema Gold A COPD gold stage A with primary emphysematous component and air trapping Improved with Spiriva Plan Maintain Spiriva daily Note patient's influenza and pneumococcal vaccines have been updated    Updated Medication List Outpatient Encounter Prescriptions as of 06/26/2014  Medication Sig  . amLODipine (NORVASC) 5 MG tablet Take 5 mg by mouth daily.    Marland Kitchen aspirin 81 MG tablet Take 81 mg by mouth daily.  Marland Kitchen atorvastatin (LIPITOR) 40 MG tablet Take 40 mg by mouth daily.    . calcium carbonate (OS-CAL - DOSED IN MG OF ELEMENTAL CALCIUM)  1250 MG tablet Take 2 tablets by mouth daily.   . clobetasol cream (TEMOVATE) 0.05 % Apply topically 2 (two) times daily as needed.  Marland Kitchen levothyroxine (SYNTHROID, LEVOTHROID) 88 MCG tablet Take 88  mcg by mouth daily before breakfast.  . olmesartan (BENICAR) 40 MG tablet Take 40 mg by mouth daily.    Marland Kitchen omeprazole (PRILOSEC) 20 MG capsule Take 20 mg by mouth daily.    Marland Kitchen PARoxetine (PAXIL) 10 MG tablet Take 10 mg by mouth every morning.    . tiotropium (SPIRIVA HANDIHALER) 18 MCG inhalation capsule Place 1 capsule (18 mcg total) into inhaler and inhale daily.  . vitamin E 100 UNIT capsule Take 100 Units by mouth daily.  . Zoledronic Acid (RECLAST IV) Inject into the vein. Yearly  . [DISCONTINUED] Chlorpheniramine Tannate 8 MG TABS Take 1 tablet (8 mg total) by mouth at bedtime.  . [DISCONTINUED] predniSONE (DELTASONE) 10 MG tablet Take two daily until gone

## 2014-07-31 ENCOUNTER — Telehealth: Payer: Self-pay | Admitting: Critical Care Medicine

## 2014-07-31 MED ORDER — TIOTROPIUM BROMIDE MONOHYDRATE 18 MCG IN CAPS: 18.0000 ug | ORAL_CAPSULE | Freq: Every day | RESPIRATORY_TRACT | Status: DC

## 2014-07-31 NOTE — Telephone Encounter (Signed)
Pt aware that samples of Spiriva left up front to be picked up.  Nothing further needed.

## 2014-08-04 DIAGNOSIS — H8110 Benign paroxysmal vertigo, unspecified ear: Secondary | ICD-10-CM | POA: Diagnosis not present

## 2014-08-16 DIAGNOSIS — M542 Cervicalgia: Secondary | ICD-10-CM | POA: Diagnosis not present

## 2014-08-23 DIAGNOSIS — R42 Dizziness and giddiness: Secondary | ICD-10-CM | POA: Diagnosis not present

## 2014-08-23 DIAGNOSIS — H8110 Benign paroxysmal vertigo, unspecified ear: Secondary | ICD-10-CM | POA: Diagnosis not present

## 2014-09-07 ENCOUNTER — Telehealth: Payer: Self-pay | Admitting: Critical Care Medicine

## 2014-09-07 NOTE — Telephone Encounter (Signed)
lmtcb X1 for pt. 2 boxes of spiriva handihaler placed up front for pt.

## 2014-09-08 NOTE — Telephone Encounter (Signed)
LMOM to advise her samples ready

## 2014-09-25 ENCOUNTER — Other Ambulatory Visit: Payer: Self-pay | Admitting: Family Medicine

## 2014-09-25 DIAGNOSIS — M810 Age-related osteoporosis without current pathological fracture: Secondary | ICD-10-CM | POA: Diagnosis not present

## 2014-09-25 DIAGNOSIS — I10 Essential (primary) hypertension: Secondary | ICD-10-CM | POA: Diagnosis not present

## 2014-10-10 DIAGNOSIS — D485 Neoplasm of uncertain behavior of skin: Secondary | ICD-10-CM | POA: Diagnosis not present

## 2014-10-10 DIAGNOSIS — L120 Bullous pemphigoid: Secondary | ICD-10-CM | POA: Diagnosis not present

## 2014-10-10 DIAGNOSIS — L820 Inflamed seborrheic keratosis: Secondary | ICD-10-CM | POA: Diagnosis not present

## 2014-10-19 ENCOUNTER — Encounter (HOSPITAL_COMMUNITY): Payer: Self-pay

## 2014-10-19 ENCOUNTER — Ambulatory Visit (HOSPITAL_COMMUNITY)
Admission: RE | Admit: 2014-10-19 | Discharge: 2014-10-19 | Disposition: A | Discharge status: Home / Self Care | Payer: Medicare Other | Source: Ambulatory Visit | Attending: Family Medicine | Admitting: Family Medicine

## 2014-10-19 DIAGNOSIS — M810 Age-related osteoporosis without current pathological fracture: Secondary | ICD-10-CM | POA: Insufficient documentation

## 2014-10-19 MED ORDER — SODIUM CHLORIDE 0.9 % IV SOLN
Freq: Once | INTRAVENOUS | Status: AC
  Administered 2014-10-19: 250 mL via INTRAVENOUS

## 2014-10-19 MED ORDER — ZOLEDRONIC ACID 5 MG/100ML IV SOLN
5.0000 mg | Freq: Once | INTRAVENOUS | Status: AC
  Administered 2014-10-19: 5 mg via INTRAVENOUS
  Filled 2014-10-19: qty 100

## 2014-10-19 NOTE — Discharge Instructions (Signed)
RECLAST °Zoledronic Acid injection (Paget's Disease, Osteoporosis) °What is this medicine? °ZOLEDRONIC ACID (ZOE le dron ik AS id) lowers the amount of calcium loss from bone. It is used to treat Paget's disease and osteoporosis in women. °This medicine may be used for other purposes; ask your health care provider or pharmacist if you have questions. °COMMON BRAND NAME(S): Reclast, Zometa °What should I tell my health care provider before I take this medicine? °They need to know if you have any of these conditions: °-aspirin-sensitive asthma °-cancer, especially if you are receiving medicines used to treat cancer °-dental disease or wear dentures °-infection °-kidney disease °-low levels of calcium in the blood °-past surgery on the parathyroid gland or intestines °-receiving corticosteroids like dexamethasone or prednisone °-an unusual or allergic reaction to zoledronic acid, other medicines, foods, dyes, or preservatives °-pregnant or trying to get pregnant °-breast-feeding °How should I use this medicine? °This medicine is for infusion into a vein. It is given by a health care professional in a hospital or clinic setting. °Talk to your pediatrician regarding the use of this medicine in children. This medicine is not approved for use in children. °Overdosage: If you think you have taken too much of this medicine contact a poison control center or emergency room at once. °NOTE: This medicine is only for you. Do not share this medicine with others. °What if I miss a dose? °It is important not to miss your dose. Call your doctor or health care professional if you are unable to keep an appointment. °What may interact with this medicine? °-certain antibiotics given by injection °-NSAIDs, medicines for pain and inflammation, like ibuprofen or naproxen °-some diuretics like bumetanide, furosemide °-teriparatide °This list may not describe all possible interactions. Give your health care provider a list of all the  medicines, herbs, non-prescription drugs, or dietary supplements you use. Also tell them if you smoke, drink alcohol, or use illegal drugs. Some items may interact with your medicine. °What should I watch for while using this medicine? °Visit your doctor or health care professional for regular checkups. It may be some time before you see the benefit from this medicine. Do not stop taking your medicine unless your doctor tells you to. Your doctor may order blood tests or other tests to see how you are doing. °Women should inform their doctor if they wish to become pregnant or think they might be pregnant. There is a potential for serious side effects to an unborn child. Talk to your health care professional or pharmacist for more information. °You should make sure that you get enough calcium and vitamin D while you are taking this medicine. Discuss the foods you eat and the vitamins you take with your health care professional. °Some people who take this medicine have severe bone, joint, and/or muscle pain. This medicine may also increase your risk for jaw problems or a broken thigh bone. Tell your doctor right away if you have severe pain in your jaw, bones, joints, or muscles. Tell your doctor if you have any pain that does not go away or that gets worse. °Tell your dentist and dental surgeon that you are taking this medicine. You should not have major dental surgery while on this medicine. See your dentist to have a dental exam and fix any dental problems before starting this medicine. Take good care of your teeth while on this medicine. Make sure you see your dentist for regular follow-up appointments. °What side effects may I notice from receiving this   medicine? °Side effects that you should report to your doctor or health care professional as soon as possible: °-allergic reactions like skin rash, itching or hives, swelling of the face, lips, or tongue °-anxiety, confusion, or depression °-breathing  problems °-changes in vision °-eye pain °-feeling faint or lightheaded, falls °-jaw pain, especially after dental work °-mouth sores °-muscle cramps, stiffness, or weakness °-trouble passing urine or change in the amount of urine °Side effects that usually do not require medical attention (report to your doctor or health care professional if they continue or are bothersome): °-bone, joint, or muscle pain °-constipation °-diarrhea °-fever °-hair loss °-irritation at site where injected °-loss of appetite °-nausea, vomiting °-stomach upset °-trouble sleeping °-trouble swallowing °-weak or tired °This list may not describe all possible side effects. Call your doctor for medical advice about side effects. You may report side effects to FDA at 1-800-FDA-1088. °Where should I keep my medicine? °This drug is given in a hospital or clinic and will not be stored at home. °NOTE: This sheet is a summary. It may not cover all possible information. If you have questions about this medicine, talk to your doctor, pharmacist, or health care provider. °© 2015, [employer]. (2013-02-21 10:03:48) °Osteoporosis °Throughout your life, your body breaks down old bone and replaces it with new bone. As you get older, your body does not replace bone as quickly as it breaks it down. By the age of 30 years, most people begin to gradually lose bone because of the imbalance between bone loss and replacement. Some people lose more bone than others. Bone loss beyond a specified normal degree is considered osteoporosis.  °Osteoporosis affects the strength and durability of your bones. The inside of the ends of your bones and your flat bones, like the bones of your pelvis, look like honeycomb, filled with tiny open spaces. As bone loss occurs, your bones become less dense. This means that the open spaces inside your bones become bigger and the walls between these spaces become thinner. This makes your bones weaker. Bones of a person with  osteoporosis can become so weak that they can break (fracture) during minor accidents, such as a simple fall. °CAUSES  °The following factors have been associated with the development of osteoporosis: °· Smoking. °· Drinking more than 2 alcoholic drinks several days per week. °· Long-term use of certain medicines: °¨ Corticosteroids. °¨ Chemotherapy medicines. °¨ Thyroid medicines. °¨ Antiepileptic medicines. °¨ Gonadal hormone suppression medicine. °¨ Immunosuppression medicine. °· Being underweight. °· Lack of physical activity. °· Lack of exposure to the sun. This can lead to vitamin D deficiency. °· Certain medical conditions: °¨ Certain inflammatory bowel diseases, such as Crohn disease and ulcerative colitis. °¨ Diabetes. °¨ Hyperthyroidism. °¨ Hyperparathyroidism. °RISK FACTORS °Anyone can develop osteoporosis. However, the following factors can increase your risk of developing osteoporosis: °· Gender--Women are at higher risk than men. °· Age--Being older than 50 years increases your risk. °· Ethnicity--White and Asian people have an increased risk. °· Weight --Being extremely underweight can increase your risk of osteoporosis. °· Family history of osteoporosis--Having a family member who has developed osteoporosis can increase your risk. °SYMPTOMS  °Usually, people with osteoporosis have no symptoms.  °DIAGNOSIS  °Signs during a physical exam that may prompt your caregiver to suspect osteoporosis include: °· Decreased height. This is usually caused by the compression of the bones that form your spine (vertebrae) because they have weakened and become fractured. °· A curving or rounding of the upper back (kyphosis). °  To confirm signs of osteoporosis, your caregiver may request a procedure that uses 2 low-dose X-ray beams with different levels of energy to measure your bone mineral density (dual-energy X-ray absorptiometry [DXA]). Also, your caregiver may check your level of vitamin D. °TREATMENT  °The goal of  osteoporosis treatment is to strengthen bones in order to decrease the risk of bone fractures. There are different types of medicines available to help achieve this goal. Some of these medicines work by slowing the processes of bone loss. Some medicines work by increasing bone density. Treatment also involves making sure that your levels of calcium and vitamin D are adequate. °PREVENTION  °There are things you can do to help prevent osteoporosis. Adequate intake of calcium and vitamin D can help you achieve optimal bone mineral density. Regular exercise can also help, especially resistance and weight-bearing activities. If you smoke, quitting smoking is an important part of osteoporosis prevention. °MAKE SURE YOU: °· Understand these instructions. °· Will watch your condition. °· Will get help right away if you are not doing well or get worse. °FOR MORE INFORMATION °www.osteo.org and www.nof.org °Document Released: 06/18/2005 Document Revised: 01/03/2013 Document Reviewed: 08/23/2011 °[employer]® Patient Information ©2015 [employer]. This information is not intended to replace advice given to you by your health care provider. Make sure you discuss any questions you have with your health care provider. ° ° °

## 2014-11-10 DIAGNOSIS — K219 Gastro-esophageal reflux disease without esophagitis: Secondary | ICD-10-CM | POA: Diagnosis not present

## 2014-11-10 DIAGNOSIS — J449 Chronic obstructive pulmonary disease, unspecified: Secondary | ICD-10-CM | POA: Diagnosis not present

## 2014-11-10 DIAGNOSIS — N183 Chronic kidney disease, stage 3 (moderate): Secondary | ICD-10-CM | POA: Diagnosis not present

## 2014-11-10 DIAGNOSIS — E785 Hyperlipidemia, unspecified: Secondary | ICD-10-CM | POA: Diagnosis not present

## 2014-11-10 DIAGNOSIS — E039 Hypothyroidism, unspecified: Secondary | ICD-10-CM | POA: Diagnosis not present

## 2014-11-10 DIAGNOSIS — M810 Age-related osteoporosis without current pathological fracture: Secondary | ICD-10-CM | POA: Diagnosis not present

## 2014-11-10 DIAGNOSIS — F419 Anxiety disorder, unspecified: Secondary | ICD-10-CM | POA: Diagnosis not present

## 2014-11-10 DIAGNOSIS — I129 Hypertensive chronic kidney disease with stage 1 through stage 4 chronic kidney disease, or unspecified chronic kidney disease: Secondary | ICD-10-CM | POA: Diagnosis not present

## 2014-12-04 ENCOUNTER — Other Ambulatory Visit: Payer: Self-pay

## 2014-12-04 DIAGNOSIS — Z1231 Encounter for screening mammogram for malignant neoplasm of breast: Secondary | ICD-10-CM

## 2015-01-09 ENCOUNTER — Ambulatory Visit
Admission: RE | Admit: 2015-01-09 | Discharge: 2015-01-09 | Disposition: A | Discharge status: Home / Self Care | Payer: Medicare Other | Source: Ambulatory Visit

## 2015-01-09 ENCOUNTER — Other Ambulatory Visit: Payer: Self-pay

## 2015-01-09 DIAGNOSIS — Z1231 Encounter for screening mammogram for malignant neoplasm of breast: Secondary | ICD-10-CM

## 2015-01-09 DIAGNOSIS — N6459 Other signs and symptoms in breast: Secondary | ICD-10-CM

## 2015-01-10 DIAGNOSIS — L120 Bullous pemphigoid: Secondary | ICD-10-CM | POA: Diagnosis not present

## 2015-01-31 ENCOUNTER — Ambulatory Visit: Payer: Medicare Other | Admitting: Critical Care Medicine

## 2015-01-31 ENCOUNTER — Encounter: Payer: Self-pay | Admitting: Critical Care Medicine

## 2015-01-31 ENCOUNTER — Ambulatory Visit (INDEPENDENT_AMBULATORY_CARE_PROVIDER_SITE_OTHER): Payer: Medicare Other | Admitting: Critical Care Medicine

## 2015-01-31 VITALS — BP 120/60 | HR 97 | Temp 97.7°F | Ht 62.0 in | Wt 110.6 lb

## 2015-01-31 DIAGNOSIS — J432 Centrilobular emphysema: Secondary | ICD-10-CM | POA: Diagnosis not present

## 2015-01-31 MED ORDER — TIOTROPIUM BROMIDE MONOHYDRATE 18 MCG IN CAPS: 18.0000 ug | ORAL_CAPSULE | Freq: Every day | RESPIRATORY_TRACT | Status: DC

## 2015-01-31 NOTE — Patient Instructions (Addendum)
No change in medications. Return in      1 year Refill sent to mail order pharmacy on [address]

## 2015-01-31 NOTE — Progress Notes (Signed)
Subjective:    Patient ID: Erin Ryan, female    DOB: Jun 28, 1925, 81 y.o.   MRN: [id number]  HPI 01/31/2015 Chief Complaint  Patient presents with  . Follow-up    Pt c/o of SOB with excertion, occasional dry cough with allergy drainage.     Notes DOE when going long distance, vacuuming and other ADLs.  Is better with meds.  occ dry cough and pndrip, and mouth is dry.   Pt denies any significant sore throat, nasal congestion or excess secretions, fever, chills, sweats, unintended weight loss, pleurtic or exertional chest pain, orthopnea PND, or leg swelling Pt denies any increase in rescue therapy over baseline, denies waking up needing it or having any early am or nocturnal exacerbations of coughing/wheezing/or dyspnea. Pt also denies any obvious fluctuation in symptoms with  weather or environmental change or other alleviating or aggravating factors   Current Medications, Allergies, Complete Past Medical History, Past Surgical History, Family History, and Social History were reviewed in [facility] record.  Past Medical History  Diagnosis Date  . Osteoporosis   . Hypertension   . High cholesterol   . GERD (gastroesophageal reflux disease)   . Arthritis   . Thyroid disease   . COPD (chronic obstructive pulmonary disease)     emphysema  . Osteoporosis      Family History  Problem Relation Age of Onset  . Heart disease Mother   . Heart disease Father   . Asthma Father   . Bladder Cancer Mother      History   Social History  . Marital Status: Widowed    Spouse Name: N/A  . Number of Children: N/A  . Years of Education: N/A   Occupational History  . Retired     Scientist, research (physical sciences)   Social History Main Topics  . Smoking status: Never Smoker   . Smokeless tobacco: Never Used  . Alcohol Use: No  . Drug Use: No  . Sexual Activity: Not on file   Other Topics Concern  . Not on file   Social History Narrative     Allergies  Allergen  Reactions  . Hydrochlorothiazide     hyponatremia     Outpatient Prescriptions Prior to Visit  Medication Sig Dispense Refill  . amLODipine (NORVASC) 5 MG tablet Take 5 mg by mouth daily.      Marland Kitchen aspirin 81 MG tablet Take 81 mg by mouth daily.    Marland Kitchen atorvastatin (LIPITOR) 40 MG tablet Take 40 mg by mouth daily.      . calcium carbonate (OS-CAL - DOSED IN MG OF ELEMENTAL CALCIUM) 1250 MG tablet Take 2 tablets by mouth daily.     . clobetasol cream (TEMOVATE) 0.05 % Apply topically 2 (two) times daily as needed.    Marland Kitchen levothyroxine (SYNTHROID, LEVOTHROID) 88 MCG tablet Take 88 mcg by mouth daily before breakfast.    . olmesartan (BENICAR) 40 MG tablet Take 40 mg by mouth daily.      Marland Kitchen omeprazole (PRILOSEC) 20 MG capsule Take 20 mg by mouth daily.      Marland Kitchen PARoxetine (PAXIL) 10 MG tablet Take 10 mg by mouth every morning.      . tiotropium (SPIRIVA HANDIHALER) 18 MCG inhalation capsule Place 1 capsule (18 mcg total) into inhaler and inhale daily. 10 capsule 0  . vitamin E 100 UNIT capsule Take 100 Units by mouth daily.    . Zoledronic Acid (RECLAST IV) Inject into the vein. Yearly  No facility-administered medications prior to visit.   Review of Systems  Constitutional: Negative for fever, chills, diaphoresis, activity change, appetite change, fatigue and unexpected weight change.  HENT: Positive for postnasal drip and voice change. Negative for congestion, dental problem, ear discharge, ear pain, facial swelling, hearing loss, mouth sores, nosebleeds, rhinorrhea, sinus pressure, sneezing, sore throat, tinnitus and trouble swallowing.   Eyes: Negative for photophobia, discharge, itching and visual disturbance.  Respiratory: Positive for cough and shortness of breath. Negative for apnea, choking, chest tightness, wheezing and stridor.   Cardiovascular: Negative for chest pain, palpitations and leg swelling.  Gastrointestinal: Negative for nausea, vomiting, abdominal pain, constipation, blood in  stool and abdominal distention.  Genitourinary: Negative for dysuria, urgency, frequency, hematuria, flank pain, decreased urine volume and difficulty urinating.  Musculoskeletal: Negative for myalgias, back pain, joint swelling, arthralgias, gait problem, neck pain and neck stiffness.  Skin: Negative for color change, pallor and rash.  Neurological: Negative for dizziness, tremors, seizures, syncope, speech difficulty, weakness, light-headedness, numbness and headaches.  Hematological: Negative for adenopathy. Does not bruise/bleed easily.  Psychiatric/Behavioral: Negative for confusion, sleep disturbance and agitation. The patient is not nervous/anxious.        Objective:   Physical Exam  Constitutional: She appears well-developed and well-nourished. She is active.  Non-toxic appearance. She does not appear ill. No distress.  HENT:  Head: Normocephalic and atraumatic.  Nose: No mucosal edema, rhinorrhea, sinus tenderness, nasal deformity or septal deviation. No epistaxis. Right sinus exhibits no maxillary sinus tenderness and no frontal sinus tenderness. Left sinus exhibits no maxillary sinus tenderness and no frontal sinus tenderness.  Mouth/Throat: Oropharynx is clear and moist. No oropharyngeal exudate.  Eyes: Conjunctivae and EOM are normal. Pupils are equal, round, and reactive to light. Right eye exhibits no discharge. Left eye exhibits no discharge. No scleral icterus.  Neck: Normal range of motion. Neck supple. Normal carotid pulses, no hepatojugular reflux and no JVD present. No tracheal tenderness and no muscular tenderness present. Carotid bruit is not present. No rigidity. No tracheal deviation, no edema, no erythema and normal range of motion present. No thyroid mass and no thyromegaly present.  Cardiovascular: Normal rate, regular rhythm, S1 normal, S2 normal, normal heart sounds, intact distal pulses and normal pulses.  PMI is not displaced.  Exam reveals no gallop, no S3, no S4,  no distant heart sounds and no friction rub.   No murmur heard.  No systolic murmur is present   No diastolic murmur is present  Pulmonary/Chest: No accessory muscle usage or stridor. No apnea and no tachypnea. No respiratory distress. She has decreased breath sounds. She has no wheezes. She has no rhonchi. She has no rales. Chest wall is not dull to percussion. She exhibits no mass, no tenderness, no bony tenderness and no deformity.  Abdominal: Soft. Normal appearance and bowel sounds are normal. She exhibits no distension, no ascites and no mass. There is no hepatosplenomegaly. There is no tenderness. There is no rigidity, no rebound, no guarding and no CVA tenderness.  Musculoskeletal: Normal range of motion.  Lymphadenopathy:       Head (right side): No submental and no submandibular adenopathy present.       Head (left side): No submental and no submandibular adenopathy present.    She has no cervical adenopathy.    She has no axillary adenopathy.  Neurological: She is alert. She has normal strength and normal reflexes. No cranial nerve deficit or sensory deficit.  Skin: Skin is warm and dry.  No bruising, no ecchymosis, no lesion and no rash noted. She is not diaphoretic. No cyanosis or erythema. No pallor. Nails show no clubbing.  Psychiatric: She has a normal mood and affect. Her speech is normal and behavior is normal.  Vitals reviewed.         Assessment & Plan:    COPD with emphysema Gold A Gold A copd with mild airway obstruction but improved at present on [place] No other changes Rov 1 yr    Erin Ryan was seen today for follow-up.  Diagnoses and all orders for this visit:  Centrilobular emphysema  Other orders -     tiotropium (SPIRIVA HANDIHALER) 18 MCG inhalation capsule; Place 1 capsule (18 mcg total) into inhaler and inhale daily.   I

## 2015-01-31 NOTE — Assessment & Plan Note (Signed)
Gold A copd with mild airway obstruction but improved at present on spiriva Plan Cont spiriva No other changes Rov 1 yr

## 2015-02-14 DIAGNOSIS — H524 Presbyopia: Secondary | ICD-10-CM | POA: Diagnosis not present

## 2015-02-14 DIAGNOSIS — H04123 Dry eye syndrome of bilateral lacrimal glands: Secondary | ICD-10-CM | POA: Diagnosis not present

## 2015-02-22 DIAGNOSIS — L120 Bullous pemphigoid: Secondary | ICD-10-CM | POA: Diagnosis not present

## 2015-02-22 DIAGNOSIS — L821 Other seborrheic keratosis: Secondary | ICD-10-CM | POA: Diagnosis not present

## 2015-02-22 DIAGNOSIS — R21 Rash and other nonspecific skin eruption: Secondary | ICD-10-CM | POA: Diagnosis not present

## 2015-02-22 DIAGNOSIS — L308 Other specified dermatitis: Secondary | ICD-10-CM | POA: Diagnosis not present

## 2015-02-22 DIAGNOSIS — D1801 Hemangioma of skin and subcutaneous tissue: Secondary | ICD-10-CM | POA: Diagnosis not present

## 2015-03-05 ENCOUNTER — Telehealth: Payer: Self-pay | Admitting: Critical Care Medicine

## 2015-03-05 MED ORDER — TIOTROPIUM BROMIDE MONOHYDRATE 18 MCG IN CAPS: 18.0000 ug | ORAL_CAPSULE | Freq: Every day | RESPIRATORY_TRACT | Status: DC

## 2015-03-05 NOTE — Telephone Encounter (Signed)
Samples have been left at the front desk. Pt is aware. Nothing further was needed. 

## 2015-04-26 ENCOUNTER — Telehealth: Payer: Self-pay | Admitting: Critical Care Medicine

## 2015-04-26 NOTE — Telephone Encounter (Signed)
I called spoke with pt. Aware we currently do not have any samples of spiriva. She verbalized understanding and needed nothing further

## 2015-05-02 ENCOUNTER — Telehealth: Payer: Self-pay | Admitting: Critical Care Medicine

## 2015-05-02 NOTE — Telephone Encounter (Signed)
Called made pt aware we currently have no spiriva samples at this time. Nothing further needed

## 2015-05-14 ENCOUNTER — Other Ambulatory Visit (INDEPENDENT_AMBULATORY_CARE_PROVIDER_SITE_OTHER): Payer: Medicare Other

## 2015-05-14 ENCOUNTER — Telehealth: Payer: Self-pay | Admitting: Critical Care Medicine

## 2015-05-14 ENCOUNTER — Ambulatory Visit (INDEPENDENT_AMBULATORY_CARE_PROVIDER_SITE_OTHER): Payer: Medicare Other | Admitting: Internal Medicine

## 2015-05-14 ENCOUNTER — Telehealth: Payer: Self-pay | Admitting: Internal Medicine

## 2015-05-14 ENCOUNTER — Encounter: Payer: Self-pay | Admitting: Internal Medicine

## 2015-05-14 ENCOUNTER — Ambulatory Visit (INDEPENDENT_AMBULATORY_CARE_PROVIDER_SITE_OTHER)
Admission: RE | Admit: 2015-05-14 | Discharge: 2015-05-14 | Disposition: A | Discharge status: Home / Self Care | Payer: Medicare Other | Source: Ambulatory Visit | Attending: Internal Medicine | Admitting: Internal Medicine

## 2015-05-14 VITALS — BP 132/66 | HR 101 | Ht 62.0 in | Wt 112.0 lb

## 2015-05-14 DIAGNOSIS — R0609 Other forms of dyspnea: Secondary | ICD-10-CM

## 2015-05-14 DIAGNOSIS — J449 Chronic obstructive pulmonary disease, unspecified: Secondary | ICD-10-CM | POA: Diagnosis not present

## 2015-05-14 DIAGNOSIS — R0600 Dyspnea, unspecified: Secondary | ICD-10-CM

## 2015-05-14 LAB — CBC WITH DIFFERENTIAL/PLATELET
BASOS PCT: 0.8 % (ref 0.0–3.0)
Basophils Absolute: 0.1 10*3/uL (ref 0.0–0.1)
Eosinophils Absolute: 0.2 10*3/uL (ref 0.0–0.7)
Eosinophils Relative: 2.3 % (ref 0.0–5.0)
HCT: 37.7 % (ref 36.0–46.0)
Hemoglobin: 12.6 g/dL (ref 12.0–15.0)
LYMPHS PCT: 22.6 % (ref 12.0–46.0)
Lymphs Abs: 1.5 10*3/uL (ref 0.7–4.0)
MCHC: 33.4 g/dL (ref 30.0–36.0)
MCV: 89.7 fl (ref 78.0–100.0)
MONOS PCT: 11.1 % (ref 3.0–12.0)
Monocytes Absolute: 0.7 10*3/uL (ref 0.1–1.0)
NEUTROS ABS: 4.2 10*3/uL (ref 1.4–7.7)
NEUTROS PCT: 63.2 % (ref 43.0–77.0)
Platelets: 283 10*3/uL (ref 150.0–400.0)
RBC: 4.21 Mil/uL (ref 3.87–5.11)
RDW: 13.8 % (ref 11.5–15.5)
WBC: 6.7 10*3/uL (ref 4.0–10.5)

## 2015-05-14 LAB — BASIC METABOLIC PANEL
BUN: 18 mg/dL (ref 6–23)
CO2: 28 meq/L (ref 19–32)
Calcium: 8.9 mg/dL (ref 8.4–10.5)
Chloride: 97 mEq/L (ref 96–112)
Creatinine, Ser: 1.01 mg/dL (ref 0.40–1.20)
GFR: 54.95 mL/min — ABNORMAL LOW (ref >60.00)
GLUCOSE: 94 mg/dL (ref 70–99)
POTASSIUM: 4.5 meq/L (ref 3.5–5.1)
SODIUM: 133 meq/L — AB (ref 135–145)

## 2015-05-14 LAB — TROPONIN I: TNIDX: 0 ug/l (ref 0.00–0.06)

## 2015-05-14 LAB — D-DIMER, QUANTITATIVE: D-Dimer, Quant: 0.55 ug/mL-FEU — ABNORMAL HIGH (ref 0.00–0.48)

## 2015-05-14 LAB — BRAIN NATRIURETIC PEPTIDE: Pro B Natriuretic peptide (BNP): 136 pg/mL — ABNORMAL HIGH (ref 0.0–100.0)

## 2015-05-14 LAB — TSH: TSH: 4.48 u[IU]/mL (ref 0.35–4.50)

## 2015-05-14 NOTE — Telephone Encounter (Signed)
Patient complaining of SOB and cough, wanted appointment to be seen today.  Scheduled patient for Acute visit with Dr. Melvyn Novas at [phone].  Nothing further needed.

## 2015-05-14 NOTE — Telephone Encounter (Signed)
Made in error

## 2015-05-14 NOTE — Progress Notes (Signed)
Subjective:    Patient ID: Erin Ryan, female    DOB: 05-29-1925   MRN: [id number]    Brief patient profile:  8 yowf never smoker with chronic doe with nl pfts documented 01/27/14    History of Present Illness  01/31/2015  Dr Laruth Bouchard  Chief Complaint  Patient presents with  . Follow-up    Pt c/o of SOB with excertion, occasional dry cough with allergy drainage.   Notes DOE when going long distance, vacuuming and other ADLs.  Is better with meds.  occ dry cough and pndrip, and mouth is dry.   rec No change in medications. Return in      1 year Refill sent to mail order pharmacy on Spiriva    05/14/2015 acute extended ov/Auri Jahnke re: sob in pt with nl baseline pfts Chief Complaint  Patient presents with  . Acute Visit    PW pt c/o increased DOE Xfew days.  also c/o nonprod cough.    had been doing ok with spiriva then x 2 weeks gradually worse doe assoc with dry cough/ dry mouth on spiriva no assoc cp / no rest sob   No obvious day to day or daytime variability or assoc chest tightness, subjective wheeze or overt sinus or hb symptoms. No unusual exp hx or h/o childhood pna/ asthma or knowledge of premature birth.  Sleeping ok without nocturnal  or early am exacerbation  of respiratory  c/o's or need for noct saba. Also denies any obvious fluctuation of symptoms with weather or environmental changes or other aggravating or alleviating factors except as outlined above.  Current Medications, Allergies, Complete Past Medical History, Past Surgical History, Family History, and Social History were reviewed in [facility] record.  ROS  The following are not active complaints unless bolded sore throat, dysphagia, dental problems, itching, sneezing,  nasal congestion or excess/ purulent secretions, ear ache,   fever, chills, sweats, unintended wt loss, classically pleuritic or exertional cp, hemoptysis,  orthopnea pnd or leg swelling, presyncope, palpitations,  abdominal pain, anorexia, nausea, vomiting, diarrhea  or change in bowel or bladder habits, change in stools or urine, dysuria,hematuria,  rash, arthralgias, visual complaints, headache, numbness, weakness or ataxia or problems with walking or coordination,  change in mood/affect or memory.                Objective:    Wt Readings from Last 3 Encounters:  05/14/15 112 lb (50.803 kg)  01/31/15 110 lb 9.6 oz (50.168 kg)  10/19/14 109 lb (49.442 kg)    Vital signs reviewed   amb wf with mint in her mouth  very hoarse lots of upper airway insp "wheezing"   HEENT: nl dentition, turbinates, and orophanx. Nl external ear canals without cough reflex   NECK :  without JVD/Nodes/TM/ nl carotid upstrokes bilaterally   LUNGS: no acc muscle use, clear to A and P bilaterally without cough on insp or exp maneuvers   CV:  RRR  no s3 or murmur or increase in P2, no edema   ABD:  soft and nontender with nl excursion in the supine position. No bruits or organomegaly, bowel sounds nl  MS:  warm without deformities, calf tenderness, cyanosis or clubbing  SKIN: warm and dry without lesions    NEURO:  alert, approp, no deficits    EKG 05/14/2015 p walking so point to sob non specific borderline st changes same as January 16, 2008 most  Pronounced inf/laterally    CXR PA and  Lateral:   05/14/2015 :     I personally reviewed images and agree with radiology impression as follows:   No active cardiopulmonary disease.    Labs ordered/ reviewed:    Lab 05/14/15 1206  NA 133*  K 4.5  CL 97  CO2 28  BUN 18  CREATININE 1.01  GLUCOSE 94      Lab 05/14/15 1206  HGB 12.6  HCT 37.7  WBC 6.7  PLT 283.0     Lab Results  Component Value Date   TSH 4.48 05/14/2015     Lab Results  Component Value Date   PROBNP 136.0* 05/14/2015     Lab Results  Component Value Date   ESRSEDRATE 9 11/24/2007      Lab Results  Component Value Date   DDIMER 0.55* 05/14/2015              Assessment  & Plan:          I

## 2015-05-14 NOTE — Patient Instructions (Addendum)
Prilosec 20 mg Take 30- 60 min before your first and last meals of the day   Try holding your spiriva x 2 weeks then return here to see Dr Joya Gaskins Tammy NP or me and in the meantime avoid activities that make you short of breath  GERD (REFLUX)  is an extremely common cause of respiratory symptoms just like yours , many times with no obvious heartburn at all.    It can be treated with medication, but also with lifestyle changes including elevation of the head of your bed (ideally with 6 inch  bed blocks),  Smoking cessation, avoidance of late meals, excessive alcohol, and avoid fatty foods, chocolate, peppermint, colas, red wine, and acidic juices such as orange juice.  NO MINT OR MENTHOL PRODUCTS SO NO COUGH DROPS  USE SUGARLESS CANDY INSTEAD (Jolley ranchers or Stover's or Life Savers) or even ice chips will also do - the key is to swallow to prevent all throat clearing. NO OIL BASED VITAMINS - use powdered substitutes.  Please remember to go to the lab and x-ray department downstairs for your tests - we will call you with the results when they are available.  Late add: return in 2 weeks

## 2015-05-15 ENCOUNTER — Telehealth: Payer: Self-pay | Admitting: Internal Medicine

## 2015-05-15 NOTE — Telephone Encounter (Signed)
lmtcb

## 2015-05-15 NOTE — Telephone Encounter (Signed)
lmtcb x1 

## 2015-05-15 NOTE — Telephone Encounter (Addendum)
Pt returning call said that she would be leaving going to the hospital brother is having surgery and to call her later this evening.Erin Ryan

## 2015-05-15 NOTE — Assessment & Plan Note (Addendum)
-    PFT's  01/27/14 FEV1 2.06 (145 % ) ratio 77  p 0 % improvement from saba with DLCO  68 % corrects to 71  % for alv volume   -  05/14/2015   Walked RA x 2 laps @ 185 ft each stopped due to  Sob/ mod pace/ no desat/ no def ekg changes ( chronic repol inf/laterally)  Symptoms are markedly disproportionate to objective findings and not clear this is a lung problem but pt does appear to have difficult airway management issues. DDX of  difficult airways management all start with A and  include Adherence, Ace Inhibitors, Acid Reflux, Active Sinus Disease, Alpha 1 Antitripsin deficiency, Anxiety masquerading as Airways dz,  ABPA,  allergy(esp in young), Aspiration (esp in elderly), Adverse effects of meds,  Active smokers, A bunch of PE's (a small clot burden can't cause this syndrome unless there is already severe underlying pulm or vascular dz with poor reserve) plus two Bs  = Bronchiectasis and Beta blocker use..and one C= CHF   Adherence is always the initial "prime suspect" and is a multilayered concern that requires a "trust but verify" approach in every patient - starting with knowing how to use medications, especially inhalers, correctly, keeping up with refills and understanding the fundamental difference between maintenance and prns vs those medications only taken for a very short course and then stopped and not refilled.   ? Acid (or non-acid) GERD > always difficult to exclude as up to 75% of pts in some series report no assoc GI/ Heartburn symptoms> rec max (24h)  acid suppression and diet restrictions/ reviewed and instructions given in writing.   ? Adverse effects of dpi/ dry mouth and cough c/w spiriva side effects > this isn't copd anyway so needs trial off    ? A bunch of pe's > D dimer nl - while an upper level nl value  may miss small peripheral pe, the clot burden with sob is moderately high and the d dimer has a very high neg pred value in this setting    ? Anxiety > note absence of  symptoms at rest so this dx less likely   ? chf / ihd > not excluded completely though no chf/ low threshold to refer to cards in this setting    I had an extended discussion with the patient reviewing all relevant studies completed to date and  lasting 25 minutes of a 40 minute visit    Each maintenance medication was reviewed in detail including most importantly the difference between maintenance and prns and under what circumstances the prns are to be triggered using an action plan format that is not reflected in the computer generated alphabetically organized AVS.    Please see instructions for details which were reviewed in writing and the patient given a copy highlighting the part that I personally wrote and discussed at today's ov.

## 2015-05-16 NOTE — Telephone Encounter (Signed)
Spoke with pt. She requested lab results be sent to her PCP Dr Harlan Stains for her upcoming ov and also a copy be mailed to her.  Copies faxed to PCP and copied mailed to pt.  Nothing further needed.

## 2015-05-16 NOTE — Telephone Encounter (Signed)
lmtcb

## 2015-05-17 DIAGNOSIS — Z0000 Encounter for general adult medical examination without abnormal findings: Secondary | ICD-10-CM | POA: Diagnosis not present

## 2015-05-17 DIAGNOSIS — Z79899 Other long term (current) drug therapy: Secondary | ICD-10-CM | POA: Diagnosis not present

## 2015-05-17 DIAGNOSIS — I129 Hypertensive chronic kidney disease with stage 1 through stage 4 chronic kidney disease, or unspecified chronic kidney disease: Secondary | ICD-10-CM | POA: Diagnosis not present

## 2015-05-17 DIAGNOSIS — E039 Hypothyroidism, unspecified: Secondary | ICD-10-CM | POA: Diagnosis not present

## 2015-05-17 DIAGNOSIS — Z23 Encounter for immunization: Secondary | ICD-10-CM | POA: Diagnosis not present

## 2015-05-17 DIAGNOSIS — E785 Hyperlipidemia, unspecified: Secondary | ICD-10-CM | POA: Diagnosis not present

## 2015-05-17 DIAGNOSIS — N183 Chronic kidney disease, stage 3 (moderate): Secondary | ICD-10-CM | POA: Diagnosis not present

## 2015-05-17 DIAGNOSIS — K219 Gastro-esophageal reflux disease without esophagitis: Secondary | ICD-10-CM | POA: Diagnosis not present

## 2015-05-17 DIAGNOSIS — J449 Chronic obstructive pulmonary disease, unspecified: Secondary | ICD-10-CM | POA: Diagnosis not present

## 2015-05-17 DIAGNOSIS — F411 Generalized anxiety disorder: Secondary | ICD-10-CM | POA: Diagnosis not present

## 2015-05-17 DIAGNOSIS — H9190 Unspecified hearing loss, unspecified ear: Secondary | ICD-10-CM | POA: Diagnosis not present

## 2015-05-17 DIAGNOSIS — M810 Age-related osteoporosis without current pathological fracture: Secondary | ICD-10-CM | POA: Diagnosis not present

## 2015-05-29 ENCOUNTER — Encounter: Payer: Self-pay | Admitting: Internal Medicine

## 2015-05-29 ENCOUNTER — Ambulatory Visit (INDEPENDENT_AMBULATORY_CARE_PROVIDER_SITE_OTHER): Payer: Medicare Other | Admitting: Internal Medicine

## 2015-05-29 VITALS — BP 120/60 | HR 86 | Ht 62.0 in | Wt 109.0 lb

## 2015-05-29 DIAGNOSIS — R0600 Dyspnea, unspecified: Secondary | ICD-10-CM

## 2015-05-29 NOTE — Patient Instructions (Addendum)
Continue present regimen x 6 more weeks then ok to leave off the pm dose of omeprazole and see if you notice any change in your symptoms.  Return for breathing or coughing issues as needed but you do not need regular follow up here

## 2015-05-29 NOTE — Progress Notes (Signed)
Subjective:    Patient ID: Erin Ryan, female    DOB: Aug 21, 1925   MRN: [id number]    Brief patient profile:  50 yowf never smoker with chronic doe with nl pfts documented 01/27/14    History of Present Illness  01/31/2015  Dr Laruth Bouchard  Chief Complaint  Patient presents with  . Follow-up    Pt c/o of SOB with excertion, occasional dry cough with allergy drainage.   Notes DOE when going long distance, vacuuming and other ADLs.  Is better with meds.  occ dry cough and pndrip, and mouth is dry.   rec No change in medications. Return in      1 year Refill sent to mail order pharmacy on Spiriva    05/14/2015 acute extended ov/W[facility] re: sob in pt with nl baseline pfts Chief Complaint  Patient presents with  . Acute Visit    PW pt c/o increased DOE Xfew days.  also c/o nonprod cough.    had been doing ok with spiriva then x 2 weeks gradually worse doe assoc with dry cough/ dry mouth on spiriva no assoc cp / no rest sob  rec Prilosec 20 mg Take 30- 60 min before your first and last meals of the day  Try holding your spiriva x 2 weeks then return here to see Dr Joya Gaskins Tammy NP or me and in the meantime avoid activities that make you short of breath GERD  Diet     05/29/2015 f/u ov/Wert re: unexplained sob  Chief Complaint  Patient presents with  . Follow-up    Pt states her breathing has imrpoved since her last visit. Cough has also improved.     Not limited by breathing from desired activities  / cough much better using ppi bid and candy     No obvious day to day or daytime variability or assoc chest tightness, subjective wheeze or overt sinus or hb symptoms. No unusual exp hx or h/o childhood pna/ asthma or knowledge of premature birth.  Sleeping ok without nocturnal  or early am exacerbation  of respiratory  c/o's or need for noct saba. Also denies any obvious fluctuation of symptoms with weather or environmental changes or other aggravating or alleviating factors except as  outlined above.  Current Medications, Allergies, Complete Past Medical History, Past Surgical History, Family History, and Social History were reviewed in [facility] record.  ROS  The following are not active complaints unless bolded sore throat, dysphagia, dental problems, itching, sneezing,  nasal congestion or excess/ purulent secretions, ear ache,   fever, chills, sweats, unintended wt loss, classically pleuritic or exertional cp, hemoptysis,  orthopnea pnd or leg swelling, presyncope, palpitations, abdominal pain, anorexia, nausea, vomiting, diarrhea  or change in bowel or bladder habits, change in stools or urine, dysuria,hematuria,  rash, arthralgias, visual complaints, headache, numbness, weakness or ataxia or problems with walking or coordination,  change in mood/affect or memory.                Objective:     05/29/2015           109  Wt Readings from Last 3 Encounters:  05/14/15 112 lb (50.803 kg)  01/31/15 110 lb 9.6 oz (50.168 kg)  10/19/14 109 lb (49.442 kg)    Vital signs reviewed   amb wf       HEENT: nl dentition, turbinates, and orophanx. Nl external ear canals without cough reflex   NECK :  without JVD/Nodes/TM/ nl  carotid upstrokes bilaterally   LUNGS: no acc muscle use, clear to A and P bilaterally without cough on insp or exp maneuvers   CV:  RRR  no s3 or murmur or increase in P2, no edema   ABD:  soft and nontender with nl excursion in the supine position. No bruits or organomegaly, bowel sounds nl  MS:  warm without deformities, calf tenderness, cyanosis or clubbing  SKIN: warm and dry without lesions    NEURO:  alert, approp, no deficits    EKG 05/14/2015 p walking so point to sob non specific borderline st changes same as Jan 13, 2008 most  Pronounced inf/laterally    CXR PA and Lateral:   05/14/2015 :     I personally reviewed images and agree with radiology impression as follows:   No active cardiopulmonary disease.      Labs   reviewed:    Lab 05/14/15 1206  NA 133*  K 4.5  CL 97  CO2 28  BUN 18  CREATININE 1.01  GLUCOSE 94      Lab 05/14/15 1206  HGB 12.6  HCT 37.7  WBC 6.7  PLT 283.0     Lab Results  Component Value Date   TSH 4.48 05/14/2015     Lab Results  Component Value Date   PROBNP 136.0* 05/14/2015     Lab Results  Component Value Date   ESRSEDRATE 9 11/24/2007      Lab Results  Component Value Date   DDIMER 0.55* 05/14/2015              Assessment & Plan:   Outpatient Encounter Prescriptions as of 05/29/2015  Medication Sig  . amLODipine (NORVASC) 5 MG tablet Take 5 mg by mouth daily.    Marland Kitchen aspirin 81 MG tablet Take 81 mg by mouth daily.  Marland Kitchen atorvastatin (LIPITOR) 40 MG tablet Take 40 mg by mouth daily.    . calcium carbonate (OS-CAL - DOSED IN MG OF ELEMENTAL CALCIUM) 1250 MG tablet Take 2 tablets by mouth daily.   . clobetasol cream (TEMOVATE) 0.05 % Apply topically 2 (two) times daily as needed.  . irbesartan (AVAPRO) 300 MG tablet Take 1 tablet by mouth daily.   Marland Kitchen levothyroxine (SYNTHROID, LEVOTHROID) 88 MCG tablet Take 88 mcg by mouth daily before breakfast.  . omeprazole (PRILOSEC) 20 MG capsule Take 20 mg by mouth 2 (two) times daily before a meal.   . PARoxetine (PAXIL) 10 MG tablet Take 10 mg by mouth every morning.    . vitamin E 100 UNIT capsule Take 100 Units by mouth daily.  . Zoledronic Acid (RECLAST IV) Inject into the vein. Yearly  . [DISCONTINUED] olmesartan (BENICAR) 40 MG tablet Take 40 mg by mouth daily.    . [DISCONTINUED] tiotropium (SPIRIVA HANDIHALER) 18 MCG inhalation capsule Place 1 capsule (18 mcg total) into inhaler and inhale daily.   No facility-administered encounter medications on file as of 05/29/2015.          I

## 2015-05-31 NOTE — Assessment & Plan Note (Signed)
-    PFT's  01/27/14 FEV1 2.06 (145 % ) ratio 77  p 0 % improvement from saba with DLCO  68 % corrects to 71  % for alv volume   -  05/14/2015   Walked RA x 2 laps @ 185 ft each stopped due to  Sob/ mod pace/ no desat/ no def ekg changes ( chronic repol inf/laterally) -  05/14/2015 trial off spiriva and on max gerd rx  > improved 05/29/2015  - 05/29/2015   Walked RA x 3 laps @ 185 ft each stopped due to  End of study, slow pace, no sob or desat, some hip pain 2d lap  Clearly no longer limited by breathing off spiriva (which pfts did not support the need for) and on empirical gerd rx, which it's always hard to tell in absence of HB whether really needed  I had an extended final summary discussion with the patient reviewing all relevant studies completed to date and  lasting 15 to 20 minutes of a 25 minute visit on the following issues:    1) no change in rx x 6 weeks then ok to decrease PPI.  Discussed the recent press about ppi's in the context of a statistically significant (but questionably clinically relevant) increase in CRI in pts on ppi vs h2's > bottom line is the lowest dose of ppi that controls   gerd is the right dose and if that dose is zero that's fine esp since h2's are cheaper.   2) Each maintenance medication was reviewed in detail including most importantly the difference between maintenance and as needed and under what circumstances the prns are to be used.  Please see instructions for details which were reviewed in writing and the patient given a copy.

## 2015-07-04 DIAGNOSIS — H6121 Impacted cerumen, right ear: Secondary | ICD-10-CM | POA: Diagnosis not present

## 2015-07-04 DIAGNOSIS — H6061 Unspecified chronic otitis externa, right ear: Secondary | ICD-10-CM | POA: Diagnosis not present

## 2015-07-11 DIAGNOSIS — H6061 Unspecified chronic otitis externa, right ear: Secondary | ICD-10-CM | POA: Diagnosis not present

## 2015-07-11 DIAGNOSIS — H903 Sensorineural hearing loss, bilateral: Secondary | ICD-10-CM | POA: Diagnosis not present

## 2015-10-10 DIAGNOSIS — M810 Age-related osteoporosis without current pathological fracture: Secondary | ICD-10-CM | POA: Diagnosis not present

## 2015-10-11 ENCOUNTER — Other Ambulatory Visit: Payer: Self-pay | Admitting: Family Medicine

## 2015-10-23 DIAGNOSIS — L309 Dermatitis, unspecified: Secondary | ICD-10-CM | POA: Diagnosis not present

## 2015-10-25 ENCOUNTER — Encounter (HOSPITAL_COMMUNITY): Payer: Self-pay

## 2015-10-25 ENCOUNTER — Ambulatory Visit (HOSPITAL_COMMUNITY)
Admission: RE | Admit: 2015-10-25 | Discharge: 2015-10-25 | Disposition: A | Discharge status: Home / Self Care | Payer: Medicare Other | Source: Ambulatory Visit | Attending: Family Medicine | Admitting: Family Medicine

## 2015-10-25 DIAGNOSIS — M810 Age-related osteoporosis without current pathological fracture: Secondary | ICD-10-CM | POA: Diagnosis not present

## 2015-10-25 MED ORDER — SODIUM CHLORIDE 0.9 % IV SOLN
Freq: Once | INTRAVENOUS | Status: AC
  Administered 2015-10-25: 12:00:00 via INTRAVENOUS

## 2015-10-25 MED ORDER — ZOLEDRONIC ACID 5 MG/100ML IV SOLN
5.0000 mg | Freq: Once | INTRAVENOUS | Status: AC
  Administered 2015-10-25: 5 mg via INTRAVENOUS
  Filled 2015-10-25: qty 100

## 2015-10-25 NOTE — Discharge Instructions (Signed)
RECLAST °Zoledronic Acid injection (Paget's Disease, Osteoporosis) °What is this medicine? °ZOLEDRONIC ACID (ZOE le dron ik AS id) lowers the amount of calcium loss from bone. It is used to treat Paget's disease and osteoporosis in women. °This medicine may be used for other purposes; ask your health care provider or pharmacist if you have questions. °What should I tell my health care provider before I take this medicine? °They need to know if you have any of these conditions: °-aspirin-sensitive asthma °-cancer, especially if you are receiving medicines used to treat cancer °-dental disease or wear dentures °-infection °-kidney disease °-low levels of calcium in the blood °-past surgery on the parathyroid gland or intestines °-receiving corticosteroids like dexamethasone or prednisone °-an unusual or allergic reaction to zoledronic acid, other medicines, foods, dyes, or preservatives °-pregnant or trying to get pregnant °-breast-feeding °How should I use this medicine? °This medicine is for infusion into a vein. It is given by a health care professional in a hospital or clinic setting. °Talk to your pediatrician regarding the use of this medicine in children. This medicine is not approved for use in children. °Overdosage: If you think you have taken too much of this medicine contact a poison control center or emergency room at once. °NOTE: This medicine is only for you. Do not share this medicine with others. °What if I miss a dose? °It is important not to miss your dose. Call your doctor or health care professional if you are unable to keep an appointment. °What may interact with this medicine? °-certain antibiotics given by injection °-NSAIDs, medicines for pain and inflammation, like ibuprofen or naproxen °-some diuretics like bumetanide, furosemide °-teriparatide °This list may not describe all possible interactions. Give your health care provider a list of all the medicines, herbs, non-prescription drugs, or  dietary supplements you use. Also tell them if you smoke, drink alcohol, or use illegal drugs. Some items may interact with your medicine. °What should I watch for while using this medicine? °Visit your doctor or health care professional for regular checkups. It may be some time before you see the benefit from this medicine. Do not stop taking your medicine unless your doctor tells you to. Your doctor may order blood tests or other tests to see how you are doing. °Women should inform their doctor if they wish to become pregnant or think they might be pregnant. There is a potential for serious side effects to an unborn child. Talk to your health care professional or pharmacist for more information. °You should make sure that you get enough calcium and vitamin D while you are taking this medicine. Discuss the foods you eat and the vitamins you take with your health care professional. °Some people who take this medicine have severe bone, joint, and/or muscle pain. This medicine may also increase your risk for jaw problems or a broken thigh bone. Tell your doctor right away if you have severe pain in your jaw, bones, joints, or muscles. Tell your doctor if you have any pain that does not go away or that gets worse. °Tell your dentist and dental surgeon that you are taking this medicine. You should not have major dental surgery while on this medicine. See your dentist to have a dental exam and fix any dental problems before starting this medicine. Take good care of your teeth while on this medicine. Make sure you see your dentist for regular follow-up appointments. °What side effects may I notice from receiving this medicine? °Side effects that you   should report to your doctor or health care professional as soon as possible: °-allergic reactions like skin rash, itching or hives, swelling of the face, lips, or tongue °-anxiety, confusion, or depression °-breathing problems °-changes in vision °-eye pain °-feeling faint or  lightheaded, falls °-jaw pain, especially after dental work °-mouth sores °-muscle cramps, stiffness, or weakness °-redness, blistering, peeling or loosening of the skin, including inside the mouth °-trouble passing urine or change in the amount of urine °Side effects that usually do not require medical attention (report to your doctor or health care professional if they continue or are bothersome): °-bone, joint, or muscle pain °-constipation °-diarrhea °-fever °-hair loss °-irritation at site where injected °-loss of appetite °-nausea, vomiting °-stomach upset °-trouble sleeping °-trouble swallowing °-weak or tired °This list may not describe all possible side effects. Call your doctor for medical advice about side effects. You may report side effects to FDA at 1-800-FDA-1088. °Where should I keep my medicine? °This drug is given in a hospital or clinic and will not be stored at home. °NOTE: This sheet is a summary. It may not cover all possible information. If you have questions about this medicine, talk to your doctor, pharmacist, or health care provider. °  °© 2016, Elsevier/Gold Standard. (2014-02-04 14:19:57) °Osteoporosis °Osteoporosis is the thinning and loss of density in the bones. Osteoporosis makes the bones more brittle, fragile, and likely to break (fracture). Over time, osteoporosis can cause the bones to become so weak that they fracture after a simple fall. The bones most likely to fracture are the bones in the hip, wrist, and spine. °CAUSES  °The exact cause is not known. °RISK FACTORS °Anyone can develop osteoporosis. You may be at greater risk if you have a family history of the condition or have poor nutrition. You may also have a higher risk if you are:  °· Female.   °· 50 years old or older. °· A smoker. °· Not physically active.   °· White or Asian. °· Slender. °SIGNS AND SYMPTOMS  °A fracture might be the first sign of the disease, especially if it results from a fall or injury that would  not usually cause a bone to break. Other signs and symptoms include:  °· Low back and neck pain. °· Stooped posture. °· Height loss. °DIAGNOSIS  °To make a diagnosis, your health care provider may: °· Take a medical history. °· Perform a physical exam. °· Order tests, such as: °¨ A bone mineral density test. °¨ A dual-energy X-ray absorptiometry test. °TREATMENT  °The goal of osteoporosis treatment is to strengthen your bones to reduce your risk of a fracture. Treatment may involve: °· Making lifestyle changes, such as: °¨ Eating a diet rich in calcium. °¨ Doing weight-bearing and muscle-strengthening exercises. °¨ Stopping tobacco use. °¨ Limiting alcohol intake. °· Taking medicine to slow the process of bone loss or to increase bone density. °· Monitoring your levels of calcium and vitamin D. °HOME CARE INSTRUCTIONS °· Include calcium and vitamin D in your diet. Calcium is important for bone health, and vitamin D helps the body absorb calcium. °· Perform weight-bearing and muscle-strengthening exercises as directed by your health care provider. °· Do not use any tobacco products, including cigarettes, chewing tobacco, and electronic cigarettes. If you need help quitting, ask your health care provider. °· Limit your alcohol intake. °· Take medicines only as directed by your health care provider. °· Keep all follow-up visits as directed by your health care provider. This is important. °· Take   precautions at home to lower your risk of falling, such as: °¨ Keeping rooms well lit and clutter free. °¨ Installing safety rails on stairs. °¨ Using rubber mats in the bathroom and other areas that are often wet or slippery. °SEEK IMMEDIATE MEDICAL CARE IF:  °You fall or injure yourself.  °  °This information is not intended to replace advice given to you by your health care provider. Make sure you discuss any questions you have with your health care provider. °  °Document Released: 06/18/2005 Document Revised: 09/29/2014  Document Reviewed: 02/16/2014 °Elsevier Interactive Patient Education ©2016 Elsevier Inc. ° ° °

## 2015-11-16 DIAGNOSIS — F411 Generalized anxiety disorder: Secondary | ICD-10-CM | POA: Diagnosis not present

## 2015-11-16 DIAGNOSIS — N183 Chronic kidney disease, stage 3 (moderate): Secondary | ICD-10-CM | POA: Diagnosis not present

## 2015-11-16 DIAGNOSIS — K219 Gastro-esophageal reflux disease without esophagitis: Secondary | ICD-10-CM | POA: Diagnosis not present

## 2015-11-16 DIAGNOSIS — I129 Hypertensive chronic kidney disease with stage 1 through stage 4 chronic kidney disease, or unspecified chronic kidney disease: Secondary | ICD-10-CM | POA: Diagnosis not present

## 2015-11-16 DIAGNOSIS — E039 Hypothyroidism, unspecified: Secondary | ICD-10-CM | POA: Diagnosis not present

## 2015-11-16 DIAGNOSIS — R51 Headache: Secondary | ICD-10-CM | POA: Diagnosis not present

## 2015-11-16 DIAGNOSIS — E785 Hyperlipidemia, unspecified: Secondary | ICD-10-CM | POA: Diagnosis not present

## 2015-12-04 ENCOUNTER — Other Ambulatory Visit: Payer: Self-pay

## 2015-12-04 DIAGNOSIS — Z1231 Encounter for screening mammogram for malignant neoplasm of breast: Secondary | ICD-10-CM

## 2015-12-19 DIAGNOSIS — L821 Other seborrheic keratosis: Secondary | ICD-10-CM | POA: Diagnosis not present

## 2015-12-19 DIAGNOSIS — L309 Dermatitis, unspecified: Secondary | ICD-10-CM | POA: Diagnosis not present

## 2015-12-19 DIAGNOSIS — L820 Inflamed seborrheic keratosis: Secondary | ICD-10-CM | POA: Diagnosis not present

## 2016-01-01 DIAGNOSIS — H02834 Dermatochalasis of left upper eyelid: Secondary | ICD-10-CM | POA: Diagnosis not present

## 2016-01-01 DIAGNOSIS — H04123 Dry eye syndrome of bilateral lacrimal glands: Secondary | ICD-10-CM | POA: Diagnosis not present

## 2016-01-01 DIAGNOSIS — H02831 Dermatochalasis of right upper eyelid: Secondary | ICD-10-CM | POA: Diagnosis not present

## 2016-01-01 DIAGNOSIS — H02832 Dermatochalasis of right lower eyelid: Secondary | ICD-10-CM | POA: Diagnosis not present

## 2016-01-01 DIAGNOSIS — H02835 Dermatochalasis of left lower eyelid: Secondary | ICD-10-CM | POA: Diagnosis not present

## 2016-01-14 ENCOUNTER — Ambulatory Visit
Admission: RE | Admit: 2016-01-14 | Discharge: 2016-01-14 | Disposition: A | Discharge status: Home / Self Care | Payer: Medicare Other | Source: Ambulatory Visit

## 2016-01-14 DIAGNOSIS — Z1231 Encounter for screening mammogram for malignant neoplasm of breast: Secondary | ICD-10-CM

## 2016-02-05 DIAGNOSIS — H903 Sensorineural hearing loss, bilateral: Secondary | ICD-10-CM | POA: Diagnosis not present

## 2016-02-05 DIAGNOSIS — H6122 Impacted cerumen, left ear: Secondary | ICD-10-CM | POA: Diagnosis not present

## 2016-02-19 DIAGNOSIS — H02831 Dermatochalasis of right upper eyelid: Secondary | ICD-10-CM | POA: Diagnosis not present

## 2016-02-19 DIAGNOSIS — H04123 Dry eye syndrome of bilateral lacrimal glands: Secondary | ICD-10-CM | POA: Diagnosis not present

## 2016-02-19 DIAGNOSIS — H524 Presbyopia: Secondary | ICD-10-CM | POA: Diagnosis not present

## 2016-02-19 DIAGNOSIS — H4423 Degenerative myopia, bilateral: Secondary | ICD-10-CM | POA: Diagnosis not present

## 2016-02-19 DIAGNOSIS — H02834 Dermatochalasis of left upper eyelid: Secondary | ICD-10-CM | POA: Diagnosis not present

## 2016-02-19 DIAGNOSIS — Z961 Presence of intraocular lens: Secondary | ICD-10-CM | POA: Diagnosis not present

## 2016-02-19 DIAGNOSIS — H52223 Regular astigmatism, bilateral: Secondary | ICD-10-CM | POA: Diagnosis not present

## 2016-02-19 DIAGNOSIS — H33321 Round hole, right eye: Secondary | ICD-10-CM | POA: Diagnosis not present

## 2016-02-19 DIAGNOSIS — H5213 Myopia, bilateral: Secondary | ICD-10-CM | POA: Diagnosis not present

## 2016-05-29 DIAGNOSIS — J302 Other seasonal allergic rhinitis: Secondary | ICD-10-CM | POA: Diagnosis not present

## 2016-05-29 DIAGNOSIS — N183 Chronic kidney disease, stage 3 (moderate): Secondary | ICD-10-CM | POA: Diagnosis not present

## 2016-05-29 DIAGNOSIS — K219 Gastro-esophageal reflux disease without esophagitis: Secondary | ICD-10-CM | POA: Diagnosis not present

## 2016-05-29 DIAGNOSIS — F419 Anxiety disorder, unspecified: Secondary | ICD-10-CM | POA: Diagnosis not present

## 2016-05-29 DIAGNOSIS — M859 Disorder of bone density and structure, unspecified: Secondary | ICD-10-CM | POA: Diagnosis not present

## 2016-05-29 DIAGNOSIS — Z23 Encounter for immunization: Secondary | ICD-10-CM | POA: Diagnosis not present

## 2016-05-29 DIAGNOSIS — I129 Hypertensive chronic kidney disease with stage 1 through stage 4 chronic kidney disease, or unspecified chronic kidney disease: Secondary | ICD-10-CM | POA: Diagnosis not present

## 2016-05-29 DIAGNOSIS — Z0000 Encounter for general adult medical examination without abnormal findings: Secondary | ICD-10-CM | POA: Diagnosis not present

## 2016-05-29 DIAGNOSIS — E039 Hypothyroidism, unspecified: Secondary | ICD-10-CM | POA: Diagnosis not present

## 2016-05-29 DIAGNOSIS — E785 Hyperlipidemia, unspecified: Secondary | ICD-10-CM | POA: Diagnosis not present

## 2016-05-31 ENCOUNTER — Other Ambulatory Visit: Payer: Self-pay | Admitting: Family Medicine

## 2016-05-31 DIAGNOSIS — M8580 Other specified disorders of bone density and structure, unspecified site: Secondary | ICD-10-CM

## 2016-06-24 ENCOUNTER — Ambulatory Visit
Admission: RE | Admit: 2016-06-24 | Discharge: 2016-06-24 | Disposition: A | Discharge status: Home / Self Care | Payer: Medicare Other | Source: Ambulatory Visit | Attending: Family Medicine | Admitting: Family Medicine

## 2016-06-24 DIAGNOSIS — M8580 Other specified disorders of bone density and structure, unspecified site: Secondary | ICD-10-CM

## 2016-06-24 DIAGNOSIS — Z780 Asymptomatic menopausal state: Secondary | ICD-10-CM | POA: Diagnosis not present

## 2016-06-24 DIAGNOSIS — M810 Age-related osteoporosis without current pathological fracture: Secondary | ICD-10-CM | POA: Diagnosis not present

## 2016-08-06 DIAGNOSIS — H6122 Impacted cerumen, left ear: Secondary | ICD-10-CM | POA: Diagnosis not present

## 2016-08-06 DIAGNOSIS — J322 Chronic ethmoidal sinusitis: Secondary | ICD-10-CM | POA: Diagnosis not present

## 2016-08-06 DIAGNOSIS — J320 Chronic maxillary sinusitis: Secondary | ICD-10-CM | POA: Diagnosis not present

## 2016-08-06 DIAGNOSIS — J040 Acute laryngitis: Secondary | ICD-10-CM | POA: Diagnosis not present

## 2016-08-06 DIAGNOSIS — H903 Sensorineural hearing loss, bilateral: Secondary | ICD-10-CM | POA: Diagnosis not present

## 2016-08-11 DIAGNOSIS — J0110 Acute frontal sinusitis, unspecified: Secondary | ICD-10-CM | POA: Diagnosis not present

## 2016-11-26 DIAGNOSIS — N183 Chronic kidney disease, stage 3 (moderate): Secondary | ICD-10-CM | POA: Diagnosis not present

## 2016-11-26 DIAGNOSIS — F419 Anxiety disorder, unspecified: Secondary | ICD-10-CM | POA: Diagnosis not present

## 2016-11-26 DIAGNOSIS — E039 Hypothyroidism, unspecified: Secondary | ICD-10-CM | POA: Diagnosis not present

## 2016-11-26 DIAGNOSIS — I129 Hypertensive chronic kidney disease with stage 1 through stage 4 chronic kidney disease, or unspecified chronic kidney disease: Secondary | ICD-10-CM | POA: Diagnosis not present

## 2016-11-26 DIAGNOSIS — M810 Age-related osteoporosis without current pathological fracture: Secondary | ICD-10-CM | POA: Diagnosis not present

## 2016-11-26 DIAGNOSIS — E785 Hyperlipidemia, unspecified: Secondary | ICD-10-CM | POA: Diagnosis not present

## 2016-12-09 ENCOUNTER — Ambulatory Visit (HOSPITAL_COMMUNITY)
Admission: RE | Admit: 2016-12-09 | Discharge: 2016-12-09 | Disposition: A | Discharge status: Home / Self Care | Payer: Medicare Other | Source: Ambulatory Visit | Attending: Family Medicine | Admitting: Family Medicine

## 2017-01-08 DIAGNOSIS — J3089 Other allergic rhinitis: Secondary | ICD-10-CM | POA: Diagnosis not present

## 2017-01-14 DIAGNOSIS — E039 Hypothyroidism, unspecified: Secondary | ICD-10-CM | POA: Diagnosis not present

## 2017-02-02 DIAGNOSIS — M810 Age-related osteoporosis without current pathological fracture: Secondary | ICD-10-CM | POA: Diagnosis not present

## 2017-02-24 DIAGNOSIS — H02834 Dermatochalasis of left upper eyelid: Secondary | ICD-10-CM | POA: Diagnosis not present

## 2017-02-24 DIAGNOSIS — H04123 Dry eye syndrome of bilateral lacrimal glands: Secondary | ICD-10-CM | POA: Diagnosis not present

## 2017-02-24 DIAGNOSIS — H5213 Myopia, bilateral: Secondary | ICD-10-CM | POA: Diagnosis not present

## 2017-02-24 DIAGNOSIS — Z961 Presence of intraocular lens: Secondary | ICD-10-CM | POA: Diagnosis not present

## 2017-02-24 DIAGNOSIS — H52223 Regular astigmatism, bilateral: Secondary | ICD-10-CM | POA: Diagnosis not present

## 2017-02-24 DIAGNOSIS — H02831 Dermatochalasis of right upper eyelid: Secondary | ICD-10-CM | POA: Diagnosis not present

## 2017-02-24 DIAGNOSIS — H524 Presbyopia: Secondary | ICD-10-CM | POA: Diagnosis not present

## 2017-03-03 ENCOUNTER — Encounter (HOSPITAL_COMMUNITY): Payer: Self-pay

## 2017-03-03 ENCOUNTER — Ambulatory Visit (HOSPITAL_COMMUNITY)
Admission: RE | Admit: 2017-03-03 | Discharge: 2017-03-03 | Disposition: A | Discharge status: Home / Self Care | Payer: Medicare Other | Source: Ambulatory Visit | Attending: Family Medicine | Admitting: Family Medicine

## 2017-03-03 DIAGNOSIS — M810 Age-related osteoporosis without current pathological fracture: Secondary | ICD-10-CM | POA: Insufficient documentation

## 2017-03-03 MED ORDER — SODIUM CHLORIDE 0.9 % IV SOLN
Freq: Once | INTRAVENOUS | Status: AC
Start: 2017-03-03 — End: 2017-03-03
  Administered 2017-03-03: 12:00:00 via INTRAVENOUS

## 2017-03-03 MED ORDER — ZOLEDRONIC ACID 5 MG/100ML IV SOLN
5.0000 mg | Freq: Once | INTRAVENOUS | Status: AC
  Administered 2017-03-03: 5 mg via INTRAVENOUS
  Filled 2017-03-03: qty 100

## 2017-03-03 NOTE — Discharge Instructions (Signed)
Zoledronic Acid injection (Paget's Disease, Osteoporosis) What is this medicine? ZOLEDRONIC ACID (ZOE le dron ik AS id) lowers the amount of calcium loss from bone. It is used to treat osteoporosis in women. This medicine may be used for other purposes; ask your health care provider or pharmacist if you have questions. COMMON BRAND NAME(S): Reclast, Zometa What should I tell my health care provider before I take this medicine? They need to know if you have any of these conditions: -aspirin-sensitive asthma -cancer, especially if you are receiving medicines used to treat cancer -dental disease or wear dentures -infection -kidney disease -low levels of calcium in the blood -past surgery on the parathyroid gland or intestines -receiving corticosteroids like dexamethasone or prednisone -an unusual or allergic reaction to zoledronic acid, other medicines, foods, dyes, or preservatives -pregnant or trying to get pregnant -breast-feeding How should I use this medicine? This medicine is for infusion into a vein. It is given by a health care professional in a hospital or clinic setting. Talk to your pediatrician regarding the use of this medicine in children. This medicine is not approved for use in children. Overdosage: If you think you have taken too much of this medicine contact a poison control center or emergency room at once. NOTE: This medicine is only for you. Do not share this medicine with others. What if I miss a dose? It is important not to miss your dose. Call your doctor or health care professional if you are unable to keep an appointment. What may interact with this medicine? -certain antibiotics given by injection -NSAIDs, medicines for pain and inflammation, like ibuprofen or naproxen -some diuretics like bumetanide, furosemide -teriparatide This list may not describe all possible interactions. Give your health care provider a list of all the medicines, herbs, non-prescription  drugs, or dietary supplements you use. Also tell them if you smoke, drink alcohol, or use illegal drugs. Some items may interact with your medicine. What should I watch for while using this medicine? Visit your doctor or health care professional for regular checkups. It may be some time before you see the benefit from this medicine. Do not stop taking your medicine unless your doctor tells you to. Your doctor may order blood tests or other tests to see how you are doing. Women should inform their doctor if they wish to become pregnant or think they might be pregnant. There is a potential for serious side effects to an unborn child. Talk to your health care professional or pharmacist for more information. You should make sure that you get enough calcium and vitamin D while you are taking this medicine. Discuss the foods you eat and the vitamins you take with your health care professional. Some people who take this medicine have severe bone, joint, and/or muscle pain. This medicine may also increase your risk for jaw problems or a broken thigh bone. Tell your doctor right away if you have severe pain in your jaw, bones, joints, or muscles. Tell your doctor if you have any pain that does not go away or that gets worse. Tell your dentist and dental surgeon that you are taking this medicine. You should not have major dental surgery while on this medicine. See your dentist to have a dental exam and fix any dental problems before starting this medicine. Take good care of your teeth while on this medicine. Make sure you see your dentist for regular follow-up appointments. What side effects may I notice from receiving this medicine? Side effects that  you should report to your doctor or health care professional as soon as possible: -allergic reactions like skin rash, itching or hives, swelling of the face, lips, or tongue -anxiety, confusion, or depression -breathing problems -changes in vision -eye  pain -feeling faint or lightheaded, falls -jaw pain, especially after dental work -mouth sores -muscle cramps, stiffness, or weakness -redness, blistering, peeling or loosening of the skin, including inside the mouth -trouble passing urine or change in the amount of urine Side effects that usually do not require medical attention (report to your doctor or health care professional if they continue or are bothersome): -bone, joint, or muscle pain -constipation -diarrhea -fever -hair loss -irritation at site where injected -loss of appetite -nausea, vomiting -stomach upset -trouble sleeping -trouble swallowing -weak or tired This list may not describe all possible side effects. Call your doctor for medical advice about side effects. You may report side effects to FDA at 1-800-FDA-1088. Where should I keep my medicine? This drug is given in a hospital or clinic and will not be stored at home. NOTE: This sheet is a summary. It may not cover all possible information. If you have questions about this medicine, talk to your doctor, pharmacist, or health care provider.  2018 Elsevier/Gold Standard (2014-02-04 14:19:57)

## 2017-03-24 DIAGNOSIS — L120 Bullous pemphigoid: Secondary | ICD-10-CM | POA: Diagnosis not present

## 2017-03-24 DIAGNOSIS — L821 Other seborrheic keratosis: Secondary | ICD-10-CM | POA: Diagnosis not present

## 2017-03-24 DIAGNOSIS — L820 Inflamed seborrheic keratosis: Secondary | ICD-10-CM | POA: Diagnosis not present

## 2017-06-23 DIAGNOSIS — H903 Sensorineural hearing loss, bilateral: Secondary | ICD-10-CM | POA: Diagnosis not present

## 2017-06-23 DIAGNOSIS — H6122 Impacted cerumen, left ear: Secondary | ICD-10-CM | POA: Diagnosis not present

## 2017-06-24 ENCOUNTER — Other Ambulatory Visit: Payer: Self-pay | Admitting: Family Medicine

## 2017-06-24 ENCOUNTER — Ambulatory Visit
Admission: RE | Admit: 2017-06-24 | Discharge: 2017-06-24 | Disposition: A | Discharge status: Home / Self Care | Payer: Medicare Other | Source: Ambulatory Visit | Attending: Family Medicine | Admitting: Family Medicine

## 2017-06-24 DIAGNOSIS — R0609 Other forms of dyspnea: Secondary | ICD-10-CM | POA: Diagnosis not present

## 2017-06-24 DIAGNOSIS — F419 Anxiety disorder, unspecified: Secondary | ICD-10-CM | POA: Diagnosis not present

## 2017-06-24 DIAGNOSIS — N183 Chronic kidney disease, stage 3 (moderate): Secondary | ICD-10-CM | POA: Diagnosis not present

## 2017-06-24 DIAGNOSIS — M65341 Trigger finger, right ring finger: Secondary | ICD-10-CM | POA: Diagnosis not present

## 2017-06-24 DIAGNOSIS — E785 Hyperlipidemia, unspecified: Secondary | ICD-10-CM | POA: Diagnosis not present

## 2017-06-24 DIAGNOSIS — M810 Age-related osteoporosis without current pathological fracture: Secondary | ICD-10-CM | POA: Diagnosis not present

## 2017-06-24 DIAGNOSIS — Z0000 Encounter for general adult medical examination without abnormal findings: Secondary | ICD-10-CM | POA: Diagnosis not present

## 2017-06-24 DIAGNOSIS — Z23 Encounter for immunization: Secondary | ICD-10-CM | POA: Diagnosis not present

## 2017-06-24 DIAGNOSIS — E039 Hypothyroidism, unspecified: Secondary | ICD-10-CM | POA: Diagnosis not present

## 2017-06-24 DIAGNOSIS — R05 Cough: Secondary | ICD-10-CM | POA: Diagnosis not present

## 2017-06-24 DIAGNOSIS — I129 Hypertensive chronic kidney disease with stage 1 through stage 4 chronic kidney disease, or unspecified chronic kidney disease: Secondary | ICD-10-CM | POA: Diagnosis not present

## 2017-06-25 ENCOUNTER — Ambulatory Visit
Admission: RE | Admit: 2017-06-25 | Discharge: 2017-06-25 | Disposition: A | Discharge status: Home / Self Care | Payer: Medicare Other | Source: Ambulatory Visit | Attending: Family Medicine | Admitting: Family Medicine

## 2017-06-25 ENCOUNTER — Other Ambulatory Visit: Payer: Self-pay | Admitting: Family Medicine

## 2017-06-25 DIAGNOSIS — R9389 Abnormal findings on diagnostic imaging of other specified body structures: Secondary | ICD-10-CM

## 2017-07-09 ENCOUNTER — Ambulatory Visit (INDEPENDENT_AMBULATORY_CARE_PROVIDER_SITE_OTHER): Payer: Medicare Other | Admitting: Internal Medicine

## 2017-07-09 ENCOUNTER — Other Ambulatory Visit (INDEPENDENT_AMBULATORY_CARE_PROVIDER_SITE_OTHER): Payer: Medicare Other

## 2017-07-09 ENCOUNTER — Encounter: Payer: Self-pay | Admitting: Internal Medicine

## 2017-07-09 VITALS — BP 120/60 | HR 94 | Ht 61.0 in | Wt 113.0 lb

## 2017-07-09 DIAGNOSIS — R05 Cough: Secondary | ICD-10-CM

## 2017-07-09 DIAGNOSIS — R0609 Other forms of dyspnea: Secondary | ICD-10-CM | POA: Diagnosis not present

## 2017-07-09 DIAGNOSIS — R058 Other specified cough: Secondary | ICD-10-CM

## 2017-07-09 LAB — CBC WITH DIFFERENTIAL/PLATELET
BASOS ABS: 0.1 10*3/uL (ref 0.0–0.1)
Basophils Relative: 1.2 % (ref 0.0–3.0)
EOS ABS: 0.2 10*3/uL (ref 0.0–0.7)
Eosinophils Relative: 2.3 % (ref 0.0–5.0)
HCT: 37.3 % (ref 36.0–46.0)
Hemoglobin: 12.2 g/dL (ref 12.0–15.0)
LYMPHS ABS: 1.9 10*3/uL (ref 0.7–4.0)
LYMPHS PCT: 27.8 % (ref 12.0–46.0)
MCHC: 32.9 g/dL (ref 30.0–36.0)
MCV: 90.3 fl (ref 78.0–100.0)
MONO ABS: 1 10*3/uL (ref 0.1–1.0)
Monocytes Relative: 14.5 % — ABNORMAL HIGH (ref 3.0–12.0)
NEUTROS ABS: 3.7 10*3/uL (ref 1.4–7.7)
NEUTROS PCT: 54.2 % (ref 43.0–77.0)
Platelets: 273 10*3/uL (ref 150.0–400.0)
RBC: 4.13 Mil/uL (ref 3.87–5.11)
RDW: 13.7 % (ref 11.5–15.5)
WBC: 6.8 10*3/uL (ref 4.0–10.5)

## 2017-07-09 LAB — BRAIN NATRIURETIC PEPTIDE: Pro B Natriuretic peptide (BNP): 147 pg/mL — ABNORMAL HIGH (ref 0.0–100.0)

## 2017-07-09 MED ORDER — OMEPRAZOLE 20 MG PO CPDR: DELAYED_RELEASE_CAPSULE | ORAL | Status: DC

## 2017-07-09 MED ORDER — PREDNISONE 10 MG PO TABS: ORAL_TABLET | ORAL | 0 refills | Status: DC

## 2017-07-09 MED ORDER — AZELASTINE-FLUTICASONE 137-50 MCG/ACT NA SUSP
NASAL | 0 refills | Status: DC
Start: 2017-07-09 — End: 2021-01-31

## 2017-07-09 MED ORDER — BENZONATATE 200 MG PO CAPS: 200.0000 mg | ORAL_CAPSULE | Freq: Three times a day (TID) | ORAL | 1 refills | Status: DC | PRN

## 2017-07-09 NOTE — Patient Instructions (Addendum)
Prilsosec 40 mg Take 30- 60 min before your first and last meals of the day   GERD (REFLUX)  is an extremely common cause of respiratory symptoms just like yours , many times with no obvious heartburn at all.    It can be treated with medication, but also with lifestyle changes including elevation of the head of your bed (ideally with 6 inch  bed blocks),  Smoking cessation, avoidance of late meals, excessive alcohol, and avoid fatty foods, chocolate, peppermint, colas, red wine, and acidic juices such as orange juice.  NO MINT OR MENTHOL PRODUCTS SO NO COUGH DROPS   USE SUGARLESS CANDY INSTEAD (Jolley ranchers or Stover's or Life Savers) or even ice chips will also do - the key is to swallow to prevent all throat clearing. NO OIL BASED VITAMINS - use powdered substitutes.  Change the calcium carbonate to calcium gluconate same total amount     Prednisone 10 mg take  4 each am x 2 days,   2 each am x 2 days,  1 each am x 2 days and stop    Stop flonase and try dymista one twice daily    In meantime  > for  Cough tessilon 200 mg every 6 hours as needed    Please schedule a follow up office visit in 2 weeks, sooner if needed  with all medications /inhalers/ solutions in hand so we can verify exactly what you are taking. This includes all medications from all doctors and over the counters

## 2017-07-09 NOTE — Progress Notes (Signed)
Subjective:    Patient ID: Erin Ryan, female    DOB: December 17, 1924   MRN: [id number]    Brief patient profile:  89 yowf never smoker with chronic doe with nl pfts documented 01/27/14    History of Present Illness  01/31/2015  Dr Laruth Bouchard  Chief Complaint  Patient presents with  . Follow-up    Pt c/o of SOB with excertion, occasional dry cough with allergy drainage.   Notes DOE when going long distance, vacuuming and other ADLs.  Is better with meds.  occ dry cough and pndrip, and mouth is dry.   rec No change in medications. Return in      1 year Refill sent to mail order pharmacy on Spiriva    05/14/2015 acute extended ov/Rayden Dock re: sob in pt with nl baseline pfts Chief Complaint  Patient presents with  . Acute Visit    PW pt c/o increased DOE Xfew days.  also c/o nonprod cough.    had been doing ok with spiriva then x 2 weeks gradually worse doe assoc with dry cough/ dry mouth on spiriva no assoc cp / no rest sob  rec Prilosec 20 mg Take 30- 60 min before your first and last meals of the day  Try holding your spiriva x 2 weeks then return here to see Dr Joya Gaskins Tammy NP or me and in the meantime avoid activities that make you short of breath GERD  Diet     05/29/2015 f/u ov/Lise Pincus re: unexplained sob  Chief Complaint  Patient presents with  . Follow-up    Pt states her breathing has imrpoved since her last visit. Cough has also improved.    Not limited by breathing from desired activities  / cough much better using ppi bid and candy  rec Continue present regimen x 6 more weeks then ok to leave off the pm dose of omeprazole and see if you notice any change in your symptoms.    07/09/2017  f/u ov/Anitta Tenny re: sob/ ? uacs recurrent despite maint low dose gerd rx Chief Complaint  Patient presents with  . Pulmonary Consult    Referred back by Dr. Dema Severin for eval of increased DOE and cough. She states her breathing has been worse over the past year, worse for several months. She  gets winded with walking up stairs and with doing chores around her home. Her cough is non prod.    did ok on just one ppi qam prior to bfast no problem with cough / sob since last ov  until 6 m prior to OV  Then cough / sob worse resumed the bid ppi but not ac Cough is nonproductive worse with activity and talking   Sleeps dlate  s resp symptoms at all Not taking any cough suppression/ sipping water helps / forgot about my recs for hard rock candy to accomplish the same thing Doe = MMRC2 = can't walk a nl pace on a flat grade s sob but does fine slow and flat eg shopping    No obvious day to day or daytime variability or assoc excess/ purulent sputum or mucus plugs or hemoptysis or cp or chest tightness, subjective wheeze or overt sinus or hb symptoms. No unusual exp hx or h/o childhood pna/ asthma or knowledge of premature birth.  Sleeping ok flat without nocturnal  or early am exacerbation  of respiratory  c/o's or need for noct saba. Also denies any obvious fluctuation of symptoms with weather or environmental changes  or other aggravating or alleviating factors except as outlined above   Current Allergies, Complete Past Medical History, Past Surgical History, Family History, and Social History were reviewed in [employer] record.  ROS  The following are not active complaints unless bolded Hoarseness, sore throat, dysphagia, dental problems, itching, sneezing,  nasal congestion or discharge of excess mucus or purulent secretions, ear ache,   fever, chills, sweats, unintended wt loss or wt gain, classically pleuritic or exertional cp,  orthopnea pnd or leg swelling, presyncope, palpitations, abdominal pain, anorexia, nausea, vomiting, diarrhea  or change in bowel habits or change in bladder habits, change in stools or change in urine, dysuria, hematuria,  rash, arthralgias, visual complaints, headache, numbness, weakness or ataxia or problems with walking or coordination,   change in mood/affect or memory.        Current Meds  Medication Sig  . amLODipine (NORVASC) 10 MG tablet Take 10 mg by mouth daily.  Marland Kitchen aspirin 81 MG tablet Take 81 mg by mouth daily.  Marland Kitchen atorvastatin (LIPITOR) 40 MG tablet Take 40 mg by mouth daily.    . calcium carbonate (OS-CAL - DOSED IN MG OF ELEMENTAL CALCIUM) 1250 MG tablet Take 2 tablets by mouth daily.   Marland Kitchen levothyroxine (SYNTHROID, LEVOTHROID) 112 MCG tablet Take 112 mcg by mouth daily before breakfast.  . omeprazole (PRILOSEC) 20 MG capsule Take 2   X 30 min before bfast and supper  . PARoxetine (PAXIL) 10 MG tablet Take 10 mg by mouth every morning.    Marland Kitchen  omeprazole (PRILOSEC) 20 MG capsule Take 20 mg by mouth 2 (two) times daily before a meal.                     Objective:      07/09/2017        113  05/29/2015           109     05/14/15 112 lb (50.803 kg)  01/31/15 110 lb 9.6 oz (50.168 kg)  10/19/14 109 lb (49.442 kg)    Vital signs reviewed  - Note on arrival 02 sats  99% on RA     amb wf   nad   / quite pleasant demeanor does not really look chronically ill at all   HEENT: nl dentition, turbinates, and oropharynx. Nl external ear canals without cough reflex   NECK :  without JVD/Nodes/TM/ nl carotid upstrokes bilaterally   LUNGS: no acc muscle use, minimal insp and exp rhonchi bilaterally / mostly pseudowheeze    CV:  RRR  no s3 or murmur or increase in P2, no edema   ABD:  soft and nontender with nl excursion in the supine position. No bruits or organomegaly, bowel sounds nl  MS:  warm without deformities, calf tenderness, cyanosis or clubbing  SKIN: warm and dry without lesions    NEURO:  alert, approp, no deficits     I personally reviewed images and agree with radiology impression as follows:  CXR:   06/24/17 Stable mild hyperinflation consistent with known chronic bronchitis. New increased density projecting over the posterior aspect of the right fifth rib this may in part be related  to overlap of normal bony structures but I am concerned that there may be a sclerotic lesion in the posterior aspect of the right fifth rib or a mass in the right pulmonary apex. An apical lordotic chest x-ray is Recommended.> showed old rib fx only  Labs ordered 07/09/2017   Allergy profile   Labs ordered/ reviewed:      Chemistry      Component Value Date/Time   NA 133 (L) 05/14/2015 1206   K 4.5 05/14/2015 1206   CL 97 05/14/2015 1206   CO2 28 05/14/2015 1206   BUN 18 05/14/2015 1206   CREATININE 1.01 05/14/2015 1206      Component Value Date/Time   CALCIUM 8.9 05/14/2015 1206        Lab Results  Component Value Date   WBC 6.8 07/09/2017   HGB 12.2 07/09/2017   HCT 37.3 07/09/2017   MCV 90.3 07/09/2017   PLT 273.0 07/09/2017     Lab Results  Component Value Date   DDIMER 0.46 07/09/2017      Lab Results  Component Value Date   TSH 4.48 05/14/2015     Lab Results  Component Value Date   PROBNP 147.0 (H) 07/09/2017               Assessment & Plan:     I

## 2017-07-10 ENCOUNTER — Telehealth: Payer: Self-pay | Admitting: Internal Medicine

## 2017-07-10 DIAGNOSIS — R05 Cough: Secondary | ICD-10-CM | POA: Insufficient documentation

## 2017-07-10 DIAGNOSIS — R058 Other specified cough: Secondary | ICD-10-CM | POA: Insufficient documentation

## 2017-07-10 LAB — RESPIRATORY ALLERGY PROFILE REGION II ~~LOC~~
Allergen, A. alternata, m6: <0.1 kU/L
Allergen, Comm Silver Birch, t9: <0.1 kU/L
Allergen, Cottonwood, t14: <0.1 kU/L
Allergen, Mouse Urine Protein, e78: <0.1 kU/L
Allergen, Oak,t7: <0.1 kU/L
Aspergillus fumigatus, m3: <0.1 kU/L
Bermuda Grass: <0.1 kU/L
CLASS: 0
CLASS: 0
CLASS: 0
CLASS: 0
CLASS: 0
CLASS: 0
Class: 0
Class: 0
Class: 0
Class: 0
Class: 0
Class: 0
Class: 0
Class: 0
Class: 0
Class: 0
Class: 0
Class: 0
Class: 0
Class: 0
Class: 0
Class: 0
Class: 0
Class: 0
Dog Dander: <0.1 kU/L
Elm IgE: <0.1 kU/L
IGE (IMMUNOGLOBULIN E), SERUM: 22 kU/L (ref <=114)
Johnson Grass: <0.1 kU/L
Pecan/Hickory Tree IgE: <0.1 kU/L
Rough Pigweed  IgE: <0.1 kU/L

## 2017-07-10 LAB — D-DIMER, QUANTITATIVE: D-Dimer, Quant: 0.46 mcg/mL FEU (ref <0.50)

## 2017-07-10 LAB — INTERPRETATION:

## 2017-07-10 NOTE — Telephone Encounter (Signed)
Yes, on a trial basis can try off all oil based vitamins and if not available in powder just leave it off for now and regroup at next ov

## 2017-07-10 NOTE — Progress Notes (Signed)
LMTCB

## 2017-07-10 NOTE — Telephone Encounter (Signed)
Pt returned phone call, pt contact # [phone]

## 2017-07-10 NOTE — Assessment & Plan Note (Addendum)
-  PFT's  01/27/14 FEV1 2.06 (145 % ) ratio 77  p 0 % improvement from saba with DLCO  68 % corrects to 71  % for alv volume   -  05/14/2015   Walked RA x 2 laps @ 185 ft each stopped due to  Sob/ mod pace/ no desat/ no def ekg changes ( chronic repol inf/laterally) -  05/14/2015 trial off spiriva and on max gerd rx  > improved 05/29/2015  - 05/29/2015   Walked RA x 3 laps @ 185 ft each stopped due to  End of study, slow pace, no sob or desat, some hip pain 2d lap - 07/09/2017  Walked RA x 3 laps @ 185 ft each stopped due to  End of study, nl pace, no   desat   - started coughing then became sob p 2nd lap '  Symptoms are markedly disproportionate to objective findings and not clear this is actually much of a  lung problem but pt does appear to have difficult to sort out respiratory symptoms of unknown origin for which  DDX  = almost all start with A and  include Adherence, Ace Inhibitors, Acid Reflux, Active Sinus Disease, Alpha 1 Antitripsin deficiency, Anxiety masquerading as Airways dz,  ABPA,  Allergy(esp in young), Aspiration (esp in elderly), Adverse effects of meds,  Active smokers, A bunch of PE's (a small clot burden can't cause this syndrome unless there is already severe underlying pulm or vascular dz with poor reserve) plus two Bs  = Bronchiectasis and Beta blocker use..and one C= CHF     Adherence is always the initial "prime suspect" and is a multilayered concern that requires a "trust but verify" approach in every patient - starting with knowing how to use medications, especially inhalers, correctly, keeping up with refills and understanding the fundamental difference between maintenance and prns vs those medications only taken for a very short course and then stopped and not refilled.  - return with all meds in hand using a trust but verify approach to confirm accurate Medication  Reconciliation The principal here is that until we are certain that the  patients are doing what we've asked, it makes  no sense to ask them to do more.   ? Acid (or non-acid) GERD > always difficult to exclude as up to 75% of pts in some series report no assoc GI/ Heartburn symptoms> rec max (24h)  acid suppression and diet restrictions/ reviewed and instructions given in writing.   ? Allergy > profile sent/ change flonase to dymista and empirical 6 d pred trial rec  plus sent profile   ? Active sinus dz > unlikely s sputum prod or noct /early am flares  ? Asp > no assoc with meals/ no recent pna  ? Anxiety/depression/ deconditionings > usually at the bottom of this list of usual suspects but should be much higher on this pt's based on H and P and note already on psychotropics .   ? A bunch of PE's > D dimer nl - while  A nl valute  may miss small peripheral pe, the clot burden with sob is moderately high and the d dimer has a very high neg pred value in this setting    ? CHF > bnp slt elevated so not completely ruled out but less likely based on h and P   I had an extended discussion with the patient reviewing all relevant studies completed to date and  lasting 25 minutes of  a 40  minute office visit to re-establish  re  severe non-specific but potentially very serious refractory respiratory symptoms of uncertain and potentially multiple  etiologies.   Each maintenance medication was reviewed in detail including most importantly the difference between maintenance and prns and under what circumstances the prns are to be triggered using an action plan format that is not reflected in the computer generated alphabetically organized AVS.    Please see AVS for specific instructions unique to this office visit that I personally wrote and verbalized to the the pt in detail and then reviewed with pt  by my nurse highlighting any changes in therapy/plan of care  recommended at today's visit.

## 2017-07-10 NOTE — Telephone Encounter (Signed)
MW pt was told yesterday at her OV to stay away from vitamins with oil bases in them.  She stated that the vitamin e that she takes has soybean oil in it.  She is concerned about this after what she was told yesterday and wanted to see if you could make some recs on this.  Thanks

## 2017-07-10 NOTE — Telephone Encounter (Signed)
ATC patient. No one answered. Will call back later.

## 2017-07-10 NOTE — Assessment & Plan Note (Addendum)
Allergy profile 07/09/2017 >  Eos 0.2 /  IgE  Pending  - max rx for gerd 07/09/2017  And trial of dymista/tessalon >>>   Upper airway cough syndrome (previously labeled PNDS),  is so named because it's frequently impossible to sort out how much is  CR/sinusitis with freq throat clearing (which can be related to primary GERD)   vs  causing  secondary (" extra esophageal")  GERD from wide swings in gastric pressure that occur with throat clearing, often  promoting self use of mint and menthol lozenges that reduce the lower esophageal sphincter tone and exacerbate the problem further in a cyclical fashion.   These are the same pts (now being labeled as having "irritable larynx syndrome" by some cough centers) who not infrequently have a history of having failed to tolerate ace inhibitors,  dry powder inhalers or biphosphonates or report having atypical/extraesophageal reflux symptoms that don't respond to standard doses of PPI  and are easily confused as having aecopd or asthma flares by even experienced allergists/ pulmonologists (myself included).    rec Prednisone 10 mg take  4 each am x 2 days,   2 each am x 2 days,  1 each am x 2 days and stop / tessalon prn/ dymista/ max gerd rx then regroup in 2 weeks

## 2017-07-10 NOTE — Telephone Encounter (Signed)
lmomtcb x 1 for the pt 

## 2017-07-13 ENCOUNTER — Telehealth: Payer: Self-pay | Admitting: Internal Medicine

## 2017-07-13 NOTE — Telephone Encounter (Signed)
Spoke with pt, she states she cannot find the vitamin E medication you told her to get wants to know if she should keep taking what she is taking or should she stop. Please advise MW

## 2017-07-13 NOTE — Telephone Encounter (Signed)
Patient calling back - she can be reached at [phone] -pr

## 2017-07-13 NOTE — Telephone Encounter (Signed)
Left message for patient to call back  

## 2017-07-13 NOTE — Telephone Encounter (Signed)
Called pt and advised message from the provider. Pt understood and verbalized understanding. Nothing further is needed.    

## 2017-07-13 NOTE — Telephone Encounter (Signed)
Vit e is a fat soluble vitamin that builds up in the system so can leave it off for a minimum of 4 weeks to see what difference if any it makes in any of her resp symptoms

## 2017-07-13 NOTE — Progress Notes (Signed)
LMTCB

## 2017-07-13 NOTE — Telephone Encounter (Signed)
lmomtcb x 3 for the pt.   

## 2017-07-14 NOTE — Progress Notes (Signed)
Spoke with pt and notified of results per Dr. Wert. Pt verbalized understanding and denied any questions. 

## 2017-07-29 ENCOUNTER — Ambulatory Visit (INDEPENDENT_AMBULATORY_CARE_PROVIDER_SITE_OTHER): Payer: Medicare Other | Admitting: Internal Medicine

## 2017-07-29 ENCOUNTER — Encounter: Payer: Self-pay | Admitting: Internal Medicine

## 2017-07-29 VITALS — BP 126/70 | HR 92 | Ht 61.0 in | Wt 110.8 lb

## 2017-07-29 DIAGNOSIS — R05 Cough: Secondary | ICD-10-CM | POA: Diagnosis not present

## 2017-07-29 DIAGNOSIS — R0609 Other forms of dyspnea: Secondary | ICD-10-CM

## 2017-07-29 DIAGNOSIS — R058 Other specified cough: Secondary | ICD-10-CM

## 2017-07-29 MED ORDER — PREDNISONE 10 MG PO TABS: ORAL_TABLET | ORAL | 0 refills | Status: DC

## 2017-07-29 NOTE — Progress Notes (Signed)
Subjective:    Patient ID: Erin Ryan, female    DOB: [birth year]   MRN: [id number]    Brief patient profile:  83 yowf never smoker with chronic doe with nl pfts documented 01/27/14    History of Present Illness  01/31/2015  Dr Erin Ryan  Chief Complaint  Patient presents with  . Follow-up    Pt c/o of SOB with excertion, occasional dry cough with allergy drainage.   Notes DOE when going long distance, vacuuming and other ADLs.  Is better with meds.  occ dry cough and pndrip, and mouth is dry.   rec No change in medications. Return in      1 year Refill sent to mail order pharmacy on Spiriva    05/14/2015 acute extended ov/Erin Ryan re: sob in pt with nl baseline pfts Chief Complaint  Patient presents with  . Acute Visit    PW pt c/o increased DOE Xfew days.  also c/o nonprod cough.    had been doing ok with spiriva then x 2 weeks gradually worse doe assoc with dry cough/ dry mouth on spiriva no assoc cp / no rest sob  rec Prilosec 20 mg Take 30- 60 min before your first and last meals of the day  Try holding your spiriva x 2 weeks then return here to see Dr Joya Gaskins Tammy NP or me and in the meantime avoid activities that make you short of breath GERD  Diet     05/29/2015 f/u ov/Erin Ryan re: unexplained sob  Chief Complaint  Patient presents with  . Follow-up    Pt states her breathing has imrpoved since her last visit. Cough has also improved.    Not limited by breathing from desired activities  / cough much better using ppi bid and candy  rec Continue present regimen x 6 more weeks then ok to leave off the pm dose of omeprazole and see if you notice any change in your symptoms.    07/09/2017  f/u ov/Erin Ryan re: sob/ ? uacs recurrent despite maint low dose gerd rx Chief Complaint  Patient presents with  . Pulmonary Consult    Referred back by Dr. Dema Severin for eval of increased DOE and cough. She states her breathing has been worse over the past year, worse for several months. She  gets winded with walking up stairs and with doing chores around her home. Her cough is non prod.    did ok on just one ppi qam prior to bfast no problem with cough / sob since last ov  until 6 m prior to OV  Then cough / sob worse resumed the bid ppi but not ac Cough is nonproductive worse with activity and talking  Sleeps dlate  s resp symptoms at all Not taking any cough suppression/ sipping water helps / forgot about my recs for hard rock candy to accomplish the same thing Doe = MMRC2 = can't walk a nl pace on a flat grade s sob but does fine slow and flat eg shopping rec Prilsosec 40 mg Take 30- 60 min before your first and last meals of the day  GERD diet . Change the calcium carbonate to calcium gluconate same total amount  Prednisone 10 mg take  4 each am x 2 days,   2 each am x 2 days,  1 each am x 2 days and stop  Stop flonase and try dymista one twice daily  In meantime  > for  Cough tessilon 200 mg every 6  hours as needed  Please schedule a follow up office visit in 2 weeks, sooner if needed  with all medications /inhalers/ solutions in hand so we can verify exactly what you are taking. This includes all medications from all doctors and over the counters     07/29/2017  f/u ov/Erin Ryan re:  Doe no change on prednisone, taking ppi bid/ cough is quite a bit better though ppi just 20 bid and on vit e oil Chief Complaint  Patient presents with  . Follow-up    Breathing has improved some, but not back to her normal baseline. She is coughing less.    for last few years, more difficulty climbing steps at town house esp over last 6 m  No trouble sleeping   Brought all meds as req  Says sob even when not coughing though that was not the case again today on walk (see a/p) and overall cough improved / remains dry and day, not noct    No obvious day to day or daytime variability or assoc excess/ purulent sputum or mucus plugs or hemoptysis or cp or chest tightness, subjective wheeze or overt  sinus or hb symptoms. No unusual exp hx or h/o childhood pna/ asthma or knowledge of premature birth.  Sleeping ok flat without nocturnal  or early am exacerbation  of respiratory  c/o's or need for noct saba. Also denies any obvious fluctuation of symptoms with weather or environmental changes or other aggravating or alleviating factors except as outlined above   Current Allergies, Complete Past Medical History, Past Surgical History, Family History, and Social History were reviewed in [employer] record.  ROS  The following are not active complaints unless bolded Hoarseness, sore throat, dysphagia, dental problems, itching, sneezing,  nasal congestion or discharge of excess mucus or purulent secretions, ear ache,   fever, chills, sweats, unintended wt loss or wt gain, classically pleuritic or exertional cp,  orthopnea pnd or leg swelling, presyncope, palpitations, abdominal pain, anorexia, nausea, vomiting, diarrhea  or change in bowel habits or change in bladder habits, change in stools or change in urine, dysuria, hematuria,  rash, arthralgias, visual complaints, headache, numbness, weakness or ataxia or problems with walking or coordination,  change in mood/affect or memory.        Current Meds  Medication Sig  . amLODipine (NORVASC) 10 MG tablet Take 10 mg by mouth daily.  Marland Kitchen aspirin 81 MG tablet Take 81 mg by mouth daily.  Marland Kitchen atorvastatin (LIPITOR) 40 MG tablet Take 40 mg by mouth daily.    . Azelastine-Fluticasone (DYMISTA) 137-50 MCG/ACT SUSP 1 spray each nostril twice daily  . benzonatate (TESSALON) 200 MG capsule Take 1 capsule (200 mg total) by mouth 3 (three) times daily as needed for cough.  . Calcium Citrate-Vitamin D (CALCIUM CITRATE + PO) Take 1 tablet 2 (two) times daily by mouth.  . denosumab (PROLIA) 60 MG/ML SOLN injection Inject 60 mg into the skin every 6 (six) months. Administer in upper arm, thigh, or abdomen  . irbesartan (AVAPRO) 300 MG tablet Take 1  tablet daily by mouth.  . levothyroxine (SYNTHROID, LEVOTHROID) 112 MCG tablet Take 112 mcg by mouth daily before breakfast.  . omeprazole (PRILOSEC) 20 MG capsule Take 2   X 30 min before bfast and supper (Patient taking differently: Take 1   X 30 min before bfast and supper)  . PARoxetine (PAXIL) 10 MG tablet Take 10 mg by mouth every morning.    . vitamin E 400  UNIT capsule Take 400 Units daily by mouth.                      Objective:     07/29/2017          110 07/09/2017        113  05/29/2015           109     05/14/15 112 lb (50.803 kg)  01/31/15 110 lb 9.6 oz (50.168 kg)  10/19/14 109 lb (49.442 kg)    Vital signs reviewed  - Note on arrival 02 sats  94% on RA       HEENT: nl dentition, turbinates bilaterally, and oropharynx. Nl external ear canals without cough reflex   NECK :  without JVD/Nodes/TM/ nl carotid upstrokes bilaterally   LUNGS: no acc muscle use,  Nl contour chest very  minimal insp and exp rhonchi bilaterally / mostly pseudowheeze   CV:  RRR  no s3 or murmur or increase in P2, and no edema   ABD:  soft and nontender with nl inspiratory excursion in the supine position. No bruits or organomegaly appreciated, bowel sounds nl  MS:  Nl gait/ ext warm without deformities, calf tenderness, cyanosis or clubbing No obvious joint restrictions   SKIN: warm and dry without lesions    NEURO:  alert, approp, nl sensorium with  no motor or cerebellar deficits apparent.        I personally reviewed images and agree with radiology impression as follows:  CXR:   06/24/17 Stable mild hyperinflation consistent with known chronic bronchitis. New increased density projecting over the posterior aspect of the right fifth rib this may in part be related to overlap of normal bony structures but I am concerned that there may be a sclerotic lesion in the posterior aspect of the right fifth rib or a mass in the right pulmonary apex. An apical lordotic chest  x-ray is Recommended.> showed old rib fx only R                 Assessment & Plan:     I

## 2017-07-29 NOTE — Patient Instructions (Addendum)
Prilsosec 40 mg (omeprazole 20 x 2)  Take 30- 60 min before your first and last meals of the day   GERD (REFLUX)  is an extremely common cause of respiratory symptoms just like yours , many times with no obvious heartburn at all.    It can be treated with medication, but also with lifestyle changes including elevation of the head of your bed (ideally with 6 inch  bed blocks),  Smoking cessation, avoidance of late meals, excessive alcohol, and avoid fatty foods, chocolate, peppermint, colas, red wine, and acidic juices such as orange juice.  NO MINT OR MENTHOL PRODUCTS SO NO COUGH DROPS   USE SUGARLESS CANDY INSTEAD (Jolley ranchers or Stover's or Life Savers) or even ice chips will also do - the key is to swallow to prevent all throat clearing. NO OIL BASED VITAMINS - use powdered substitutes.  Prednisone Take 4 for three days 3 for three days 2 for three days 1 for three days and stop   In meantime  > for  Cough tessilon 200 mg every 6 hours as needed   Please schedule a follow up office visit in 3 weeks for full pfts on return

## 2017-07-30 ENCOUNTER — Encounter: Payer: Self-pay | Admitting: Internal Medicine

## 2017-07-30 NOTE — Assessment & Plan Note (Addendum)
-    PFT's  01/27/14 FEV1 2.06 (145 % ) ratio 77  p 0 % improvement from saba with DLCO  68 % corrects to 71  % for alv volume   -  05/14/2015   Walked RA x 2 laps @ 185 ft each stopped due to  Sob/ mod pace/ no desat/ no def ekg changes ( chronic repol inf/laterally) -  05/14/2015 trial off spiriva and on max gerd rx  > improved 05/29/2015  - 05/29/2015   Walked RA x 3 laps @ 185 ft each stopped due to  End of study, slow pace, no sob or desat, some hip pain 2d lap - 07/09/2017  Walked RA x 3 laps @ 185 ft each stopped due to  End of study, nl pace, no   desat   - started coughing then became sob p 2nd lap - 07/29/2017  Walked RA x 3 laps @ 185 ft each stopped due to  End of study,fast pace, no desat   But coughed then sob   - Spirometry 07/29/2017  FEV1 1.55 (120%)  Ratio 70 with mod curvature   - 07/29/2017  Walked RA x 3 laps @ 185 ft each stopped due to  End of study, fast  pace, no  desat  But started coughing toward end of study then became sob     Still not clear whether the  Lung  problem, which is very mild, has anything to do her symptoms but the cough with exertion suggests she does have an airway issue of some kind, either upper or lower.  The good news is though that her documented ex tol is excellent for age 89   To sort  Out sob with cough (since she thinks the pred may have helped x 6 days but not sure)  rec a 12 day course of pred and max rx for gerd x 3 weeks then return to regroup  I had an extended discussion with the patient and her daughter reviewing all relevant studies completed to date and  lasting 15 to 20 minutes of a 25 minute visit    Each maintenance medication was reviewed in detail including most importantly the difference between maintenance and prns and under what circumstances the prns are to be triggered using an action plan format that is not reflected in the computer generated alphabetically organized AVS.    Please see AVS for specific instructions unique to this visit  that I personally wrote and verbalized to the the pt in detail and then reviewed with pt  by my nurse highlighting any  changes in therapy recommended at today's visit to their plan of care.

## 2017-07-30 NOTE — Assessment & Plan Note (Signed)
Allergy profile 07/09/2017 >  Eos 0.2 /  IgE  22 RAST neg - max rx for gerd 07/09/2017  And trial of dymista/tessalon >>> ? Some better but not adherent with gerd meds/ diet so rechallenged 07/29/2017

## 2017-08-21 DIAGNOSIS — H6061 Unspecified chronic otitis externa, right ear: Secondary | ICD-10-CM | POA: Diagnosis not present

## 2017-08-21 DIAGNOSIS — H6121 Impacted cerumen, right ear: Secondary | ICD-10-CM | POA: Diagnosis not present

## 2017-08-31 ENCOUNTER — Ambulatory Visit: Payer: Medicare Other | Admitting: Internal Medicine

## 2017-09-08 DIAGNOSIS — M810 Age-related osteoporosis without current pathological fracture: Secondary | ICD-10-CM | POA: Diagnosis not present

## 2017-09-11 ENCOUNTER — Ambulatory Visit (INDEPENDENT_AMBULATORY_CARE_PROVIDER_SITE_OTHER): Payer: Medicare Other | Admitting: Internal Medicine

## 2017-09-11 ENCOUNTER — Encounter: Payer: Self-pay | Admitting: Internal Medicine

## 2017-09-11 VITALS — BP 124/60 | HR 94 | Ht 61.0 in | Wt 113.0 lb

## 2017-09-11 DIAGNOSIS — R05 Cough: Secondary | ICD-10-CM | POA: Diagnosis not present

## 2017-09-11 DIAGNOSIS — R0609 Other forms of dyspnea: Secondary | ICD-10-CM

## 2017-09-11 DIAGNOSIS — R058 Other specified cough: Secondary | ICD-10-CM

## 2017-09-11 LAB — PULMONARY FUNCTION TEST
DL/VA % PRED: 70 %
DL/VA: 3.08 ml/min/mmHg/L
DLCO UNC % PRED: 61 %
DLCO cor % pred: 64 %
DLCO cor: 13.03 ml/min/mmHg
DLCO unc: 12.34 ml/min/mmHg
FEF 25-75 Post: 1.59 L/sec
FEF 25-75 Pre: 1.12 L/sec
FEF2575-%CHANGE-POST: 41 %
FEF2575-%PRED-POST: 233 %
FEF2575-%Pred-Pre: 164 %
FEV1-%Change-Post: 6 %
FEV1-%PRED-PRE: 136 %
FEV1-%Pred-Post: 146 %
FEV1-PRE: 1.75 L
FEV1-Post: 1.87 L
FEV1FVC-%Change-Post: 5 %
FEV1FVC-%PRED-PRE: 99 %
FEV6-%Change-Post: 1 %
FEV6-%PRED-POST: 152 %
FEV6-%Pred-Pre: 150 %
FEV6-PRE: 2.44 L
FEV6-Post: 2.47 L
FEV6FVC-%PRED-PRE: 108 %
FEV6FVC-%Pred-Post: 108 %
FVC-%Change-Post: 1 %
FVC-%PRED-POST: 140 %
FVC-%Pred-Pre: 138 %
FVC-Post: 2.47 L
FVC-Pre: 2.44 L
POST FEV1/FVC RATIO: 76 %
PRE FEV6/FVC RATIO: 100 %
Post FEV6/FVC ratio: 100 %
Pre FEV1/FVC ratio: 72 %
RV % PRED: 108 %
RV: 2.65 L
TLC % pred: 106 %
TLC: 4.89 L

## 2017-09-11 MED ORDER — OMEPRAZOLE 40 MG PO CPDR: 40.0000 mg | DELAYED_RELEASE_CAPSULE | Freq: Two times a day (BID) | ORAL | 3 refills | Status: DC

## 2017-09-11 NOTE — Progress Notes (Signed)
Subjective:    Patient ID: Gerda Yin, female    DOB: [birth year]   MRN: [id number]    Brief patient profile:  50 yowf never smoker with chronic doe with nl pfts documented 01/27/14  And 09/11/2017 while off all resp rx    History of Present Illness  05/14/2015 acute extended ov/Alani Lacivita re: sob in pt with nl baseline pfts Chief Complaint  Patient presents with  . Acute Visit    PW pt c/o increased DOE Xfew days.  also c/o nonprod cough.    had been doing ok with spiriva then x 2 weeks gradually worse doe assoc with dry cough/ dry mouth on spiriva no assoc cp / no rest sob  rec Prilosec 20 mg Take 30- 60 min before your first and last meals of the day  Try holding your spiriva x 2 weeks then return here to see Dr Joya Gaskins Tammy NP or me and in the meantime avoid activities that make you short of breath GERD  Diet     05/29/2015 f/u ov/Tabius Rood re: unexplained sob  Chief Complaint  Patient presents with  . Follow-up    Pt states her breathing has imrpoved since her last visit. Cough has also improved.    Not limited by breathing from desired activities  / cough much better using ppi bid and candy  rec Continue present regimen x 6 more weeks then ok to leave off the pm dose of omeprazole and see if you notice any change in your symptoms.    07/09/2017  f/u ov/Cortlan Dolin re: sob/ ? uacs recurrent despite maint low dose gerd rx Chief Complaint  Patient presents with  . Pulmonary Consult    Referred back by Dr. Dema Severin for eval of increased DOE and cough. She states her breathing has been worse over the past year, worse for several months. She gets winded with walking up stairs and with doing chores around her home. Her cough is non prod.    did ok on just one ppi qam prior to bfast no problem with cough / sob since last ov  until 6 m prior to OV  Then cough / sob worse resumed the bid ppi but not ac Cough is nonproductive worse with activity and talking  Sleeps dlate  s resp symptoms at all Not  taking any cough suppression/ sipping water helps / forgot about my recs for hard rock candy to accomplish the same thing Doe = MMRC2 = can't walk a nl pace on a flat grade s sob but does fine slow and flat eg shopping rec Prilsosec 40 mg Take 30- 60 min before your first and last meals of the day  GERD diet . Change the calcium carbonate to calcium gluconate same total amount  Prednisone 10 mg take  4 each am x 2 days,   2 each am x 2 days,  1 each am x 2 days and stop  Stop flonase and try dymista one twice daily  In meantime  > for  Cough tessilon 200 mg every 6 hours as needed  Please schedule a follow up office visit in 2 weeks, sooner if needed  with all medications /inhalers/ solutions in hand so we can verify exactly what you are taking. This includes all medications from all doctors and over the counters     07/29/2017  f/u ov/Abbigael Detlefsen re:  Doe no change on prednisone, taking ppi bid/ cough is quite a bit better though ppi just 20 bid and on  vit e oil Chief Complaint  Patient presents with  . Follow-up    Breathing has improved some, but not back to her normal baseline. She is coughing less.    for last few years, more difficulty climbing steps at town house esp over last 6 m  No trouble sleeping   Brought all meds as req  Says sob even when not coughing though that was not the case again today on walk (see a/p) and overall cough improved / remains dry and day, not noct  rec Prilsosec 40 mg (omeprazole 20 x 2)  Take 30- 60 min before your first and last meals of the day  GERD  Prednisone Take 4 for three days 3 for three days 2 for three days 1 for three days and stop  In meantime  > for  Cough tessilon 200 mg every 6 hours as needed     09/11/2017  f/u ov/Grethel Zenk re: uacs resolved on ppi bid/ no resp rx Chief Complaint  Patient presents with  . Follow-up    Breathing has improved some. She c/o "stuffy head" x 2 days.   Not limited by breathing from desired activities   No cough  with walking or sleeping now  No obvious day to day or daytime variability or assoc excess/ purulent sputum or mucus plugs or hemoptysis or cp or chest tightness, subjective wheeze or overt sinus or hb symptoms. No unusual exposure hx or h/o childhood pna/ asthma or knowledge of premature birth.  Sleeping ok flat without nocturnal  or early am exacerbation  of respiratory  c/o's or need for noct saba. Also denies any obvious fluctuation of symptoms with weather or environmental changes or other aggravating or alleviating factors except as outlined above   Current Allergies, Complete Past Medical History, Past Surgical History, Family History, and Social History were reviewed in [employer] record.  ROS  The following are not active complaints unless bolded Hoarseness, sore throat, dysphagia, dental problems, itching, sneezing,  nasal congestion or discharge of excess mucus or purulent secretions, ear ache,   fever, chills, sweats, unintended wt loss or wt gain, classically pleuritic or exertional cp,  orthopnea pnd or leg swelling, presyncope, palpitations, abdominal pain, anorexia, nausea, vomiting, diarrhea  or change in bowel habits or change in bladder habits, change in stools or change in urine, dysuria, hematuria,  rash, arthralgias, visual complaints, headache, numbness, weakness or ataxia or problems with walking or coordination,  change in mood/affect or memory.        Current Meds  Medication Sig  . amLODipine (NORVASC) 10 MG tablet Take 10 mg by mouth daily.  Marland Kitchen aspirin 81 MG tablet Take 81 mg by mouth daily.  Marland Kitchen atorvastatin (LIPITOR) 40 MG tablet Take 40 mg by mouth daily.    . benzonatate (TESSALON) 200 MG capsule Take 1 capsule (200 mg total) by mouth 3 (three) times daily as needed for cough.  . Calcium Citrate-Vitamin D (CALCIUM CITRATE + PO) Take 1 tablet 2 (two) times daily by mouth.  . denosumab (PROLIA) 60 MG/ML SOLN injection Inject 60 mg into the skin  every 6 (six) months. Administer in upper arm, thigh, or abdomen  . irbesartan (AVAPRO) 300 MG tablet Take 1 tablet daily by mouth.  . levothyroxine (SYNTHROID, LEVOTHROID) 112 MCG tablet Take 112 mcg by mouth daily before breakfast.  . omeprazole (PRILOSEC) 20 MG capsule Take 2   X 30 min before bfast and supper (Patient taking differently: Take 1  X 30 min before bfast and supper)  . PARoxetine (PAXIL) 10 MG tablet Take 10 mg by mouth every morning.                           Objective:     Very pleasant elderly wf, extremely HOH but all smiles today   09/11/2017        113  07/29/2017          110 07/09/2017        113  05/29/2015           109     05/14/15 112 lb (50.803 kg)  01/31/15 110 lb 9.6 oz (50.168 kg)  10/19/14 109 lb (49.442 kg)     Vital signs reviewed - Note on arrival 02 sats  98% on RA      HEENT: nl dentition, turbinates bilaterally, and oropharynx. Nl external ear canals without cough reflex   NECK :  without JVD/Nodes/TM/ nl carotid upstrokes bilaterally   LUNGS: no acc muscle use,  Nl contour chest which is clear to A and P bilaterally without cough on insp or exp maneuvers   CV:  RRR  no s3 or murmur or increase in P2, and no edema   ABD:  soft and nontender with nl inspiratory excursion in the supine position. No bruits or organomegaly appreciated, bowel sounds nl  MS:  Nl gait/ ext warm without deformities, calf tenderness, cyanosis or clubbing No obvious joint restrictions   SKIN: warm and dry without lesions    NEURO:  alert, approp, nl sensorium with  no motor or cerebellar deficits apparent.                     Assessment & Plan:     I

## 2017-09-11 NOTE — Patient Instructions (Signed)
Your lung function is normal and most likely the cough was coming from reflux  Centrum silver or Centrum a to z is all the vitamins you   If you are satisfied with your treatment plan,  let your doctor know and he/she can either refill your medications or you can return here when your prescription runs out.     If in any way you are not 100% satisfied,  please tell us.  If 100% better, tell your friends!  Pulmonary follow up is as needed

## 2017-09-11 NOTE — Progress Notes (Signed)
PFT completed today 09/11/17

## 2017-09-12 ENCOUNTER — Encounter: Payer: Self-pay | Admitting: Internal Medicine

## 2017-09-12 NOTE — Assessment & Plan Note (Addendum)
-    PFT's  01/27/14 FEV1 2.06 (145 % ) ratio 77  p 0 % improvement from saba with DLCO  68 % corrects to 71  % for alv volume   -  05/14/2015   Walked RA x 2 laps @ 185 ft each stopped due to  Sob/ mod pace/ no desat/ no def ekg changes ( chronic repol inf/laterally) -  05/14/2015 trial off spiriva and on max gerd rx  > improved 05/29/2015  - 05/29/2015   Walked RA x 3 laps @ 185 ft each stopped due to  End of study, slow pace, no sob or desat, some hip pain 2d lap - 07/09/2017  Walked RA x 3 laps @ 185 ft each stopped due to  End of study, nl pace, no   desat   - started coughing then became sob p 2nd lap - 07/29/2017  Walked RA x 3 laps @ 185 ft each stopped due to  End of study,fast pace, no desat   But coughed then sob  - Spirometry 07/29/2017  FEV1 1.55 (120%)  Ratio 70 with mod curvature   - 07/29/2017  Walked RA x 3 laps @ 185 ft each stopped due to  End of study, fast  pace, no  desat  But started coughing toward end of study then became sob   - PFT's  09/11/2017  FEV1 1.87 (146 % ) ratio 76  p 6 % improvement from saba p nothing prior to study with DLCO  61/64 % corrects to 70 % for alv volume    Doe resolved on gerd rx > no pulmonary  f/u needed

## 2017-09-12 NOTE — Assessment & Plan Note (Signed)
Allergy profile 07/09/2017 >  Eos 0.2 /  IgE  22 RAST neg - max rx for gerd 07/09/2017  And trial of dymista/tessalon >>> ? Some better but not adherent with gerd meds/ diet so rechallenged 07/29/2017 > resolved as of 09/11/2017   I had an extended final summary discussion with the patient reviewing all relevant studies completed to date and  lasting 15 to 20 minutes of a 25 minute visit on the following issues:   She has now flared at least twice when not following diet/ gerd recs.  At age 83 I believe all that is needed here is more attention to detail with diet and stay on ppi bid indefinitely   Pulmonary f/u is prn

## 2017-12-23 DIAGNOSIS — M810 Age-related osteoporosis without current pathological fracture: Secondary | ICD-10-CM | POA: Diagnosis not present

## 2017-12-23 DIAGNOSIS — I129 Hypertensive chronic kidney disease with stage 1 through stage 4 chronic kidney disease, or unspecified chronic kidney disease: Secondary | ICD-10-CM | POA: Diagnosis not present

## 2017-12-23 DIAGNOSIS — E785 Hyperlipidemia, unspecified: Secondary | ICD-10-CM | POA: Diagnosis not present

## 2017-12-23 DIAGNOSIS — J449 Chronic obstructive pulmonary disease, unspecified: Secondary | ICD-10-CM | POA: Diagnosis not present

## 2017-12-23 DIAGNOSIS — F411 Generalized anxiety disorder: Secondary | ICD-10-CM | POA: Diagnosis not present

## 2017-12-23 DIAGNOSIS — M545 Low back pain: Secondary | ICD-10-CM | POA: Diagnosis not present

## 2017-12-23 DIAGNOSIS — N183 Chronic kidney disease, stage 3 (moderate): Secondary | ICD-10-CM | POA: Diagnosis not present

## 2017-12-23 DIAGNOSIS — E039 Hypothyroidism, unspecified: Secondary | ICD-10-CM | POA: Diagnosis not present

## 2018-03-02 DIAGNOSIS — H02834 Dermatochalasis of left upper eyelid: Secondary | ICD-10-CM | POA: Diagnosis not present

## 2018-03-02 DIAGNOSIS — H02831 Dermatochalasis of right upper eyelid: Secondary | ICD-10-CM | POA: Diagnosis not present

## 2018-03-02 DIAGNOSIS — Z961 Presence of intraocular lens: Secondary | ICD-10-CM | POA: Diagnosis not present

## 2018-03-02 DIAGNOSIS — H5213 Myopia, bilateral: Secondary | ICD-10-CM | POA: Diagnosis not present

## 2018-03-02 DIAGNOSIS — E039 Hypothyroidism, unspecified: Secondary | ICD-10-CM | POA: Diagnosis not present

## 2018-03-02 DIAGNOSIS — H524 Presbyopia: Secondary | ICD-10-CM | POA: Diagnosis not present

## 2018-03-02 DIAGNOSIS — H04123 Dry eye syndrome of bilateral lacrimal glands: Secondary | ICD-10-CM | POA: Diagnosis not present

## 2018-03-02 DIAGNOSIS — H33321 Round hole, right eye: Secondary | ICD-10-CM | POA: Diagnosis not present

## 2018-03-02 DIAGNOSIS — H4423 Degenerative myopia, bilateral: Secondary | ICD-10-CM | POA: Diagnosis not present

## 2018-03-02 DIAGNOSIS — H52223 Regular astigmatism, bilateral: Secondary | ICD-10-CM | POA: Diagnosis not present

## 2018-03-03 DIAGNOSIS — M79605 Pain in left leg: Secondary | ICD-10-CM | POA: Diagnosis not present

## 2018-03-04 ENCOUNTER — Other Ambulatory Visit: Payer: Self-pay | Admitting: Family Medicine

## 2018-03-04 ENCOUNTER — Ambulatory Visit
Admission: RE | Admit: 2018-03-04 | Discharge: 2018-03-04 | Disposition: A | Discharge status: Home / Self Care | Payer: Medicare Other | Source: Ambulatory Visit | Attending: Family Medicine | Admitting: Family Medicine

## 2018-03-04 DIAGNOSIS — M79662 Pain in left lower leg: Secondary | ICD-10-CM | POA: Diagnosis not present

## 2018-03-04 DIAGNOSIS — M79605 Pain in left leg: Secondary | ICD-10-CM

## 2018-03-04 DIAGNOSIS — M7989 Other specified soft tissue disorders: Secondary | ICD-10-CM | POA: Diagnosis not present

## 2018-03-09 DIAGNOSIS — M810 Age-related osteoporosis without current pathological fracture: Secondary | ICD-10-CM | POA: Diagnosis not present

## 2018-04-12 DIAGNOSIS — E039 Hypothyroidism, unspecified: Secondary | ICD-10-CM | POA: Diagnosis not present

## 2018-04-21 DIAGNOSIS — H903 Sensorineural hearing loss, bilateral: Secondary | ICD-10-CM | POA: Diagnosis not present

## 2018-04-21 DIAGNOSIS — H6122 Impacted cerumen, left ear: Secondary | ICD-10-CM | POA: Diagnosis not present

## 2018-04-21 DIAGNOSIS — J301 Allergic rhinitis due to pollen: Secondary | ICD-10-CM | POA: Diagnosis not present

## 2018-05-17 DIAGNOSIS — L300 Nummular dermatitis: Secondary | ICD-10-CM | POA: Diagnosis not present

## 2018-05-17 DIAGNOSIS — L821 Other seborrheic keratosis: Secondary | ICD-10-CM | POA: Diagnosis not present

## 2018-05-25 DIAGNOSIS — E039 Hypothyroidism, unspecified: Secondary | ICD-10-CM | POA: Diagnosis not present

## 2018-06-25 DIAGNOSIS — L820 Inflamed seborrheic keratosis: Secondary | ICD-10-CM | POA: Diagnosis not present

## 2018-08-01 ENCOUNTER — Other Ambulatory Visit: Payer: Self-pay | Admitting: Internal Medicine

## 2018-08-17 DIAGNOSIS — Z23 Encounter for immunization: Secondary | ICD-10-CM | POA: Diagnosis not present

## 2018-08-17 DIAGNOSIS — K219 Gastro-esophageal reflux disease without esophagitis: Secondary | ICD-10-CM | POA: Diagnosis not present

## 2018-08-17 DIAGNOSIS — N183 Chronic kidney disease, stage 3 (moderate): Secondary | ICD-10-CM | POA: Diagnosis not present

## 2018-08-17 DIAGNOSIS — E785 Hyperlipidemia, unspecified: Secondary | ICD-10-CM | POA: Diagnosis not present

## 2018-08-17 DIAGNOSIS — M810 Age-related osteoporosis without current pathological fracture: Secondary | ICD-10-CM | POA: Diagnosis not present

## 2018-08-17 DIAGNOSIS — I129 Hypertensive chronic kidney disease with stage 1 through stage 4 chronic kidney disease, or unspecified chronic kidney disease: Secondary | ICD-10-CM | POA: Diagnosis not present

## 2018-08-17 DIAGNOSIS — F411 Generalized anxiety disorder: Secondary | ICD-10-CM | POA: Diagnosis not present

## 2018-08-17 DIAGNOSIS — J449 Chronic obstructive pulmonary disease, unspecified: Secondary | ICD-10-CM | POA: Diagnosis not present

## 2018-08-17 DIAGNOSIS — E039 Hypothyroidism, unspecified: Secondary | ICD-10-CM | POA: Diagnosis not present

## 2018-08-17 DIAGNOSIS — Z79899 Other long term (current) drug therapy: Secondary | ICD-10-CM | POA: Diagnosis not present

## 2018-08-17 DIAGNOSIS — Z0000 Encounter for general adult medical examination without abnormal findings: Secondary | ICD-10-CM | POA: Diagnosis not present

## 2018-08-24 ENCOUNTER — Other Ambulatory Visit: Payer: Self-pay | Admitting: Family Medicine

## 2018-08-24 DIAGNOSIS — M810 Age-related osteoporosis without current pathological fracture: Secondary | ICD-10-CM

## 2018-09-09 DIAGNOSIS — M810 Age-related osteoporosis without current pathological fracture: Secondary | ICD-10-CM | POA: Diagnosis not present

## 2018-10-01 DIAGNOSIS — H903 Sensorineural hearing loss, bilateral: Secondary | ICD-10-CM | POA: Diagnosis not present

## 2018-10-01 DIAGNOSIS — R040 Epistaxis: Secondary | ICD-10-CM | POA: Diagnosis not present

## 2018-10-01 DIAGNOSIS — J322 Chronic ethmoidal sinusitis: Secondary | ICD-10-CM | POA: Diagnosis not present

## 2018-10-01 DIAGNOSIS — J320 Chronic maxillary sinusitis: Secondary | ICD-10-CM | POA: Diagnosis not present

## 2018-10-01 DIAGNOSIS — H6121 Impacted cerumen, right ear: Secondary | ICD-10-CM | POA: Diagnosis not present

## 2018-10-07 DIAGNOSIS — H903 Sensorineural hearing loss, bilateral: Secondary | ICD-10-CM | POA: Diagnosis not present

## 2018-10-07 DIAGNOSIS — H6121 Impacted cerumen, right ear: Secondary | ICD-10-CM | POA: Diagnosis not present

## 2018-10-18 ENCOUNTER — Ambulatory Visit
Admission: RE | Admit: 2018-10-18 | Discharge: 2018-10-18 | Disposition: A | Discharge status: Home / Self Care | Payer: Medicare Other | Source: Ambulatory Visit | Attending: Family Medicine | Admitting: Family Medicine

## 2018-10-18 DIAGNOSIS — M8589 Other specified disorders of bone density and structure, multiple sites: Secondary | ICD-10-CM | POA: Diagnosis not present

## 2018-10-18 DIAGNOSIS — M810 Age-related osteoporosis without current pathological fracture: Secondary | ICD-10-CM

## 2018-10-18 DIAGNOSIS — Z780 Asymptomatic menopausal state: Secondary | ICD-10-CM | POA: Diagnosis not present

## 2018-11-11 DIAGNOSIS — L820 Inflamed seborrheic keratosis: Secondary | ICD-10-CM | POA: Diagnosis not present

## 2018-12-08 DIAGNOSIS — L821 Other seborrheic keratosis: Secondary | ICD-10-CM | POA: Diagnosis not present

## 2018-12-08 DIAGNOSIS — L820 Inflamed seborrheic keratosis: Secondary | ICD-10-CM | POA: Diagnosis not present

## 2019-02-15 DIAGNOSIS — K219 Gastro-esophageal reflux disease without esophagitis: Secondary | ICD-10-CM | POA: Diagnosis not present

## 2019-02-15 DIAGNOSIS — E785 Hyperlipidemia, unspecified: Secondary | ICD-10-CM | POA: Diagnosis not present

## 2019-02-15 DIAGNOSIS — I129 Hypertensive chronic kidney disease with stage 1 through stage 4 chronic kidney disease, or unspecified chronic kidney disease: Secondary | ICD-10-CM | POA: Diagnosis not present

## 2019-02-15 DIAGNOSIS — E039 Hypothyroidism, unspecified: Secondary | ICD-10-CM | POA: Diagnosis not present

## 2019-02-15 DIAGNOSIS — M810 Age-related osteoporosis without current pathological fracture: Secondary | ICD-10-CM | POA: Diagnosis not present

## 2019-02-15 DIAGNOSIS — F411 Generalized anxiety disorder: Secondary | ICD-10-CM | POA: Diagnosis not present

## 2019-02-15 DIAGNOSIS — R0600 Dyspnea, unspecified: Secondary | ICD-10-CM | POA: Diagnosis not present

## 2019-02-15 DIAGNOSIS — N183 Chronic kidney disease, stage 3 (moderate): Secondary | ICD-10-CM | POA: Diagnosis not present

## 2019-02-23 DIAGNOSIS — L820 Inflamed seborrheic keratosis: Secondary | ICD-10-CM | POA: Diagnosis not present

## 2019-03-08 DIAGNOSIS — Z961 Presence of intraocular lens: Secondary | ICD-10-CM | POA: Diagnosis not present

## 2019-03-08 DIAGNOSIS — H02831 Dermatochalasis of right upper eyelid: Secondary | ICD-10-CM | POA: Diagnosis not present

## 2019-03-08 DIAGNOSIS — H52223 Regular astigmatism, bilateral: Secondary | ICD-10-CM | POA: Diagnosis not present

## 2019-03-08 DIAGNOSIS — H524 Presbyopia: Secondary | ICD-10-CM | POA: Diagnosis not present

## 2019-03-08 DIAGNOSIS — H5213 Myopia, bilateral: Secondary | ICD-10-CM | POA: Diagnosis not present

## 2019-03-08 DIAGNOSIS — H02834 Dermatochalasis of left upper eyelid: Secondary | ICD-10-CM | POA: Diagnosis not present

## 2019-03-08 DIAGNOSIS — H04123 Dry eye syndrome of bilateral lacrimal glands: Secondary | ICD-10-CM | POA: Diagnosis not present

## 2019-03-14 DIAGNOSIS — M810 Age-related osteoporosis without current pathological fracture: Secondary | ICD-10-CM | POA: Diagnosis not present

## 2019-03-14 DIAGNOSIS — N183 Chronic kidney disease, stage 3 (moderate): Secondary | ICD-10-CM | POA: Diagnosis not present

## 2019-03-14 DIAGNOSIS — E039 Hypothyroidism, unspecified: Secondary | ICD-10-CM | POA: Diagnosis not present

## 2019-06-07 DIAGNOSIS — H903 Sensorineural hearing loss, bilateral: Secondary | ICD-10-CM | POA: Diagnosis not present

## 2019-06-20 DIAGNOSIS — H02834 Dermatochalasis of left upper eyelid: Secondary | ICD-10-CM | POA: Diagnosis not present

## 2019-06-20 DIAGNOSIS — Z961 Presence of intraocular lens: Secondary | ICD-10-CM | POA: Diagnosis not present

## 2019-06-20 DIAGNOSIS — H04123 Dry eye syndrome of bilateral lacrimal glands: Secondary | ICD-10-CM | POA: Diagnosis not present

## 2019-06-20 DIAGNOSIS — H02831 Dermatochalasis of right upper eyelid: Secondary | ICD-10-CM | POA: Diagnosis not present

## 2019-06-29 DIAGNOSIS — Z23 Encounter for immunization: Secondary | ICD-10-CM | POA: Diagnosis not present

## 2019-07-08 DIAGNOSIS — M549 Dorsalgia, unspecified: Secondary | ICD-10-CM | POA: Diagnosis not present

## 2019-07-08 DIAGNOSIS — E039 Hypothyroidism, unspecified: Secondary | ICD-10-CM | POA: Diagnosis not present

## 2019-07-08 DIAGNOSIS — R54 Age-related physical debility: Secondary | ICD-10-CM | POA: Diagnosis not present

## 2019-07-09 DIAGNOSIS — E875 Hyperkalemia: Secondary | ICD-10-CM | POA: Diagnosis not present

## 2019-07-13 ENCOUNTER — Other Ambulatory Visit: Payer: Self-pay | Admitting: Family Medicine

## 2019-07-13 DIAGNOSIS — M5441 Lumbago with sciatica, right side: Secondary | ICD-10-CM | POA: Diagnosis not present

## 2019-07-13 DIAGNOSIS — M5442 Lumbago with sciatica, left side: Secondary | ICD-10-CM

## 2019-07-13 DIAGNOSIS — N3090 Cystitis, unspecified without hematuria: Secondary | ICD-10-CM | POA: Diagnosis not present

## 2019-07-13 DIAGNOSIS — M810 Age-related osteoporosis without current pathological fracture: Secondary | ICD-10-CM | POA: Diagnosis not present

## 2019-07-17 ENCOUNTER — Ambulatory Visit
Admission: RE | Admit: 2019-07-17 | Discharge: 2019-07-17 | Disposition: A | Discharge status: Home / Self Care | Payer: Medicare Other | Source: Ambulatory Visit | Attending: Family Medicine | Admitting: Family Medicine

## 2019-07-17 ENCOUNTER — Other Ambulatory Visit: Payer: Self-pay

## 2019-07-17 DIAGNOSIS — M5442 Lumbago with sciatica, left side: Secondary | ICD-10-CM

## 2019-07-17 DIAGNOSIS — M48061 Spinal stenosis, lumbar region without neurogenic claudication: Secondary | ICD-10-CM | POA: Diagnosis not present

## 2019-07-30 ENCOUNTER — Other Ambulatory Visit: Payer: Medicare Other

## 2019-08-02 DIAGNOSIS — M4180 Other forms of scoliosis, site unspecified: Secondary | ICD-10-CM | POA: Diagnosis not present

## 2019-08-02 DIAGNOSIS — M545 Low back pain: Secondary | ICD-10-CM | POA: Diagnosis not present

## 2019-08-02 DIAGNOSIS — M5136 Other intervertebral disc degeneration, lumbar region: Secondary | ICD-10-CM | POA: Diagnosis not present

## 2019-08-02 DIAGNOSIS — M4856XA Collapsed vertebra, not elsewhere classified, lumbar region, initial encounter for fracture: Secondary | ICD-10-CM | POA: Diagnosis not present

## 2019-08-23 DIAGNOSIS — J4520 Mild intermittent asthma, uncomplicated: Secondary | ICD-10-CM | POA: Diagnosis not present

## 2019-08-23 DIAGNOSIS — F411 Generalized anxiety disorder: Secondary | ICD-10-CM | POA: Diagnosis not present

## 2019-08-23 DIAGNOSIS — M4856XD Collapsed vertebra, not elsewhere classified, lumbar region, subsequent encounter for fracture with routine healing: Secondary | ICD-10-CM | POA: Diagnosis not present

## 2019-08-23 DIAGNOSIS — J449 Chronic obstructive pulmonary disease, unspecified: Secondary | ICD-10-CM | POA: Diagnosis not present

## 2019-08-23 DIAGNOSIS — Z0000 Encounter for general adult medical examination without abnormal findings: Secondary | ICD-10-CM | POA: Diagnosis not present

## 2019-08-23 DIAGNOSIS — I129 Hypertensive chronic kidney disease with stage 1 through stage 4 chronic kidney disease, or unspecified chronic kidney disease: Secondary | ICD-10-CM | POA: Diagnosis not present

## 2019-08-23 DIAGNOSIS — M810 Age-related osteoporosis without current pathological fracture: Secondary | ICD-10-CM | POA: Diagnosis not present

## 2019-08-23 DIAGNOSIS — R54 Age-related physical debility: Secondary | ICD-10-CM | POA: Diagnosis not present

## 2019-08-23 DIAGNOSIS — K219 Gastro-esophageal reflux disease without esophagitis: Secondary | ICD-10-CM | POA: Diagnosis not present

## 2019-08-23 DIAGNOSIS — N183 Chronic kidney disease, stage 3 unspecified: Secondary | ICD-10-CM | POA: Diagnosis not present

## 2019-08-23 DIAGNOSIS — E785 Hyperlipidemia, unspecified: Secondary | ICD-10-CM | POA: Diagnosis not present

## 2019-08-23 DIAGNOSIS — E039 Hypothyroidism, unspecified: Secondary | ICD-10-CM | POA: Diagnosis not present

## 2019-08-27 DIAGNOSIS — M810 Age-related osteoporosis without current pathological fracture: Secondary | ICD-10-CM | POA: Diagnosis not present

## 2019-08-27 DIAGNOSIS — E039 Hypothyroidism, unspecified: Secondary | ICD-10-CM | POA: Diagnosis not present

## 2019-08-27 DIAGNOSIS — R5381 Other malaise: Secondary | ICD-10-CM | POA: Diagnosis not present

## 2019-08-27 DIAGNOSIS — M8589 Other specified disorders of bone density and structure, multiple sites: Secondary | ICD-10-CM | POA: Diagnosis not present

## 2019-08-27 DIAGNOSIS — M5441 Lumbago with sciatica, right side: Secondary | ICD-10-CM | POA: Diagnosis not present

## 2019-08-27 DIAGNOSIS — F419 Anxiety disorder, unspecified: Secondary | ICD-10-CM | POA: Diagnosis not present

## 2019-08-27 DIAGNOSIS — J449 Chronic obstructive pulmonary disease, unspecified: Secondary | ICD-10-CM | POA: Diagnosis not present

## 2019-08-27 DIAGNOSIS — K219 Gastro-esophageal reflux disease without esophagitis: Secondary | ICD-10-CM | POA: Diagnosis not present

## 2019-08-27 DIAGNOSIS — M4856XD Collapsed vertebra, not elsewhere classified, lumbar region, subsequent encounter for fracture with routine healing: Secondary | ICD-10-CM | POA: Diagnosis not present

## 2019-08-27 DIAGNOSIS — J4520 Mild intermittent asthma, uncomplicated: Secondary | ICD-10-CM | POA: Diagnosis not present

## 2019-08-27 DIAGNOSIS — Z723 Lack of physical exercise: Secondary | ICD-10-CM | POA: Diagnosis not present

## 2019-08-30 DIAGNOSIS — M4856XD Collapsed vertebra, not elsewhere classified, lumbar region, subsequent encounter for fracture with routine healing: Secondary | ICD-10-CM | POA: Diagnosis not present

## 2019-08-31 DIAGNOSIS — R5381 Other malaise: Secondary | ICD-10-CM | POA: Diagnosis not present

## 2019-08-31 DIAGNOSIS — J449 Chronic obstructive pulmonary disease, unspecified: Secondary | ICD-10-CM | POA: Diagnosis not present

## 2019-08-31 DIAGNOSIS — Z723 Lack of physical exercise: Secondary | ICD-10-CM | POA: Diagnosis not present

## 2019-08-31 DIAGNOSIS — M810 Age-related osteoporosis without current pathological fracture: Secondary | ICD-10-CM | POA: Diagnosis not present

## 2019-08-31 DIAGNOSIS — J4520 Mild intermittent asthma, uncomplicated: Secondary | ICD-10-CM | POA: Diagnosis not present

## 2019-08-31 DIAGNOSIS — M4856XD Collapsed vertebra, not elsewhere classified, lumbar region, subsequent encounter for fracture with routine healing: Secondary | ICD-10-CM | POA: Diagnosis not present

## 2019-09-02 DIAGNOSIS — Z723 Lack of physical exercise: Secondary | ICD-10-CM | POA: Diagnosis not present

## 2019-09-02 DIAGNOSIS — R5381 Other malaise: Secondary | ICD-10-CM | POA: Diagnosis not present

## 2019-09-02 DIAGNOSIS — M4856XD Collapsed vertebra, not elsewhere classified, lumbar region, subsequent encounter for fracture with routine healing: Secondary | ICD-10-CM | POA: Diagnosis not present

## 2019-09-02 DIAGNOSIS — J4520 Mild intermittent asthma, uncomplicated: Secondary | ICD-10-CM | POA: Diagnosis not present

## 2019-09-02 DIAGNOSIS — M810 Age-related osteoporosis without current pathological fracture: Secondary | ICD-10-CM | POA: Diagnosis not present

## 2019-09-02 DIAGNOSIS — J449 Chronic obstructive pulmonary disease, unspecified: Secondary | ICD-10-CM | POA: Diagnosis not present

## 2019-09-06 DIAGNOSIS — M4856XD Collapsed vertebra, not elsewhere classified, lumbar region, subsequent encounter for fracture with routine healing: Secondary | ICD-10-CM | POA: Diagnosis not present

## 2019-09-06 DIAGNOSIS — Z723 Lack of physical exercise: Secondary | ICD-10-CM | POA: Diagnosis not present

## 2019-09-06 DIAGNOSIS — M810 Age-related osteoporosis without current pathological fracture: Secondary | ICD-10-CM | POA: Diagnosis not present

## 2019-09-06 DIAGNOSIS — J4520 Mild intermittent asthma, uncomplicated: Secondary | ICD-10-CM | POA: Diagnosis not present

## 2019-09-06 DIAGNOSIS — R5381 Other malaise: Secondary | ICD-10-CM | POA: Diagnosis not present

## 2019-09-06 DIAGNOSIS — J449 Chronic obstructive pulmonary disease, unspecified: Secondary | ICD-10-CM | POA: Diagnosis not present

## 2019-09-08 DIAGNOSIS — J449 Chronic obstructive pulmonary disease, unspecified: Secondary | ICD-10-CM | POA: Diagnosis not present

## 2019-09-08 DIAGNOSIS — M810 Age-related osteoporosis without current pathological fracture: Secondary | ICD-10-CM | POA: Diagnosis not present

## 2019-09-08 DIAGNOSIS — J4520 Mild intermittent asthma, uncomplicated: Secondary | ICD-10-CM | POA: Diagnosis not present

## 2019-09-08 DIAGNOSIS — R5381 Other malaise: Secondary | ICD-10-CM | POA: Diagnosis not present

## 2019-09-08 DIAGNOSIS — M4856XD Collapsed vertebra, not elsewhere classified, lumbar region, subsequent encounter for fracture with routine healing: Secondary | ICD-10-CM | POA: Diagnosis not present

## 2019-09-08 DIAGNOSIS — Z723 Lack of physical exercise: Secondary | ICD-10-CM | POA: Diagnosis not present

## 2019-09-09 DIAGNOSIS — Z723 Lack of physical exercise: Secondary | ICD-10-CM | POA: Diagnosis not present

## 2019-09-09 DIAGNOSIS — M810 Age-related osteoporosis without current pathological fracture: Secondary | ICD-10-CM | POA: Diagnosis not present

## 2019-09-09 DIAGNOSIS — J449 Chronic obstructive pulmonary disease, unspecified: Secondary | ICD-10-CM | POA: Diagnosis not present

## 2019-09-09 DIAGNOSIS — M4856XD Collapsed vertebra, not elsewhere classified, lumbar region, subsequent encounter for fracture with routine healing: Secondary | ICD-10-CM | POA: Diagnosis not present

## 2019-09-09 DIAGNOSIS — R5381 Other malaise: Secondary | ICD-10-CM | POA: Diagnosis not present

## 2019-09-09 DIAGNOSIS — J4520 Mild intermittent asthma, uncomplicated: Secondary | ICD-10-CM | POA: Diagnosis not present

## 2019-09-12 DIAGNOSIS — Z723 Lack of physical exercise: Secondary | ICD-10-CM | POA: Diagnosis not present

## 2019-09-12 DIAGNOSIS — R5381 Other malaise: Secondary | ICD-10-CM | POA: Diagnosis not present

## 2019-09-12 DIAGNOSIS — M4856XD Collapsed vertebra, not elsewhere classified, lumbar region, subsequent encounter for fracture with routine healing: Secondary | ICD-10-CM | POA: Diagnosis not present

## 2019-09-12 DIAGNOSIS — M810 Age-related osteoporosis without current pathological fracture: Secondary | ICD-10-CM | POA: Diagnosis not present

## 2019-09-12 DIAGNOSIS — J4520 Mild intermittent asthma, uncomplicated: Secondary | ICD-10-CM | POA: Diagnosis not present

## 2019-09-12 DIAGNOSIS — J449 Chronic obstructive pulmonary disease, unspecified: Secondary | ICD-10-CM | POA: Diagnosis not present

## 2019-09-18 DIAGNOSIS — J4520 Mild intermittent asthma, uncomplicated: Secondary | ICD-10-CM | POA: Diagnosis not present

## 2019-09-18 DIAGNOSIS — M4856XD Collapsed vertebra, not elsewhere classified, lumbar region, subsequent encounter for fracture with routine healing: Secondary | ICD-10-CM | POA: Diagnosis not present

## 2019-09-18 DIAGNOSIS — R5381 Other malaise: Secondary | ICD-10-CM | POA: Diagnosis not present

## 2019-09-18 DIAGNOSIS — M810 Age-related osteoporosis without current pathological fracture: Secondary | ICD-10-CM | POA: Diagnosis not present

## 2019-09-18 DIAGNOSIS — J449 Chronic obstructive pulmonary disease, unspecified: Secondary | ICD-10-CM | POA: Diagnosis not present

## 2019-09-18 DIAGNOSIS — Z723 Lack of physical exercise: Secondary | ICD-10-CM | POA: Diagnosis not present

## 2019-09-21 DIAGNOSIS — J449 Chronic obstructive pulmonary disease, unspecified: Secondary | ICD-10-CM | POA: Diagnosis not present

## 2019-09-21 DIAGNOSIS — Z723 Lack of physical exercise: Secondary | ICD-10-CM | POA: Diagnosis not present

## 2019-09-21 DIAGNOSIS — M4856XD Collapsed vertebra, not elsewhere classified, lumbar region, subsequent encounter for fracture with routine healing: Secondary | ICD-10-CM | POA: Diagnosis not present

## 2019-09-21 DIAGNOSIS — R5381 Other malaise: Secondary | ICD-10-CM | POA: Diagnosis not present

## 2019-09-21 DIAGNOSIS — M810 Age-related osteoporosis without current pathological fracture: Secondary | ICD-10-CM | POA: Diagnosis not present

## 2019-09-21 DIAGNOSIS — J4520 Mild intermittent asthma, uncomplicated: Secondary | ICD-10-CM | POA: Diagnosis not present

## 2019-09-27 DIAGNOSIS — M4856XD Collapsed vertebra, not elsewhere classified, lumbar region, subsequent encounter for fracture with routine healing: Secondary | ICD-10-CM | POA: Diagnosis not present

## 2019-10-14 ENCOUNTER — Ambulatory Visit: Payer: Medicare Other | Attending: Internal Medicine

## 2019-10-14 DIAGNOSIS — Z23 Encounter for immunization: Secondary | ICD-10-CM | POA: Insufficient documentation

## 2019-10-14 NOTE — Progress Notes (Signed)
   Covid-19 Vaccination Clinic  Name:  Erin Ryan    MRN: [id number] DOB: Apr 28, 1925  10/14/2019  Erin Ryan was observed post Covid-19 immunization for 15 minutes without incidence. She was provided with Vaccine Information Sheet and instruction to access the V-Safe system.   Erin Ryan was instructed to call 911 with any severe reactions post vaccine: Marland Kitchen Difficulty breathing  . Swelling of your face and throat  . A fast heartbeat  . A bad rash all over your body  . Dizziness and weakness    Immunizations Administered    Name Date Dose VIS Date Route   Pfizer COVID-19 Vaccine 10/14/2019  5:01 PM 0.3 mL 09/02/2019 Intramuscular   Manufacturer: [place]   Lot: [id number]   [place]: [id number]

## 2019-10-17 ENCOUNTER — Telehealth: Payer: Self-pay | Admitting: *Deleted

## 2019-10-17 NOTE — Telephone Encounter (Signed)
Pt reschedule on 11/04/19 for her 2nd covid vaccine.

## 2019-11-01 ENCOUNTER — Ambulatory Visit: Payer: Medicare Other

## 2019-11-04 ENCOUNTER — Ambulatory Visit: Payer: Medicare Other | Attending: Internal Medicine

## 2019-11-04 DIAGNOSIS — Z23 Encounter for immunization: Secondary | ICD-10-CM | POA: Insufficient documentation

## 2019-11-04 NOTE — Progress Notes (Signed)
   Covid-19 Vaccination Clinic  Name:  Erin Ryan    MRN: [id number] DOB: 12/31/1924  11/04/2019  Erin Ryan was observed post Covid-19 immunization for 15 minutes without incidence. She was provided with Vaccine Information Sheet and instruction to access the V-Safe system.   Erin Ryan was instructed to call 911 with any severe reactions post vaccine: Marland Kitchen Difficulty breathing  . Swelling of your face and throat  . A fast heartbeat  . A bad rash all over your body  . Dizziness and weakness    Immunizations Administered    Name Date Dose VIS Date Route   Pfizer COVID-19 Vaccine 11/04/2019 12:28 PM 0.3 mL 09/02/2019 Intramuscular   Manufacturer: [place]   Lot: [id number]   [place]: [id number]

## 2020-01-26 DIAGNOSIS — L821 Other seborrheic keratosis: Secondary | ICD-10-CM | POA: Diagnosis not present

## 2020-01-26 DIAGNOSIS — D1801 Hemangioma of skin and subcutaneous tissue: Secondary | ICD-10-CM | POA: Diagnosis not present

## 2020-02-17 DIAGNOSIS — M4856XD Collapsed vertebra, not elsewhere classified, lumbar region, subsequent encounter for fracture with routine healing: Secondary | ICD-10-CM | POA: Diagnosis not present

## 2020-02-24 DIAGNOSIS — H52203 Unspecified astigmatism, bilateral: Secondary | ICD-10-CM | POA: Diagnosis not present

## 2020-02-24 DIAGNOSIS — H33301 Unspecified retinal break, right eye: Secondary | ICD-10-CM | POA: Diagnosis not present

## 2020-02-24 DIAGNOSIS — Z961 Presence of intraocular lens: Secondary | ICD-10-CM | POA: Diagnosis not present

## 2020-02-24 DIAGNOSIS — H04123 Dry eye syndrome of bilateral lacrimal glands: Secondary | ICD-10-CM | POA: Diagnosis not present

## 2020-04-23 DIAGNOSIS — M5136 Other intervertebral disc degeneration, lumbar region: Secondary | ICD-10-CM | POA: Diagnosis not present

## 2020-04-23 DIAGNOSIS — M533 Sacrococcygeal disorders, not elsewhere classified: Secondary | ICD-10-CM | POA: Diagnosis not present

## 2020-05-09 DIAGNOSIS — M533 Sacrococcygeal disorders, not elsewhere classified: Secondary | ICD-10-CM | POA: Diagnosis not present

## 2020-05-25 DIAGNOSIS — M545 Low back pain: Secondary | ICD-10-CM | POA: Diagnosis not present

## 2020-06-21 DIAGNOSIS — K219 Gastro-esophageal reflux disease without esophagitis: Secondary | ICD-10-CM | POA: Diagnosis not present

## 2020-06-21 DIAGNOSIS — N183 Chronic kidney disease, stage 3 unspecified: Secondary | ICD-10-CM | POA: Diagnosis not present

## 2020-06-21 DIAGNOSIS — F411 Generalized anxiety disorder: Secondary | ICD-10-CM | POA: Diagnosis not present

## 2020-06-21 DIAGNOSIS — J069 Acute upper respiratory infection, unspecified: Secondary | ICD-10-CM | POA: Diagnosis not present

## 2020-06-21 DIAGNOSIS — E039 Hypothyroidism, unspecified: Secondary | ICD-10-CM | POA: Diagnosis not present

## 2020-06-21 DIAGNOSIS — I129 Hypertensive chronic kidney disease with stage 1 through stage 4 chronic kidney disease, or unspecified chronic kidney disease: Secondary | ICD-10-CM | POA: Diagnosis not present

## 2020-06-21 DIAGNOSIS — E785 Hyperlipidemia, unspecified: Secondary | ICD-10-CM | POA: Diagnosis not present

## 2020-07-17 DIAGNOSIS — L821 Other seborrheic keratosis: Secondary | ICD-10-CM | POA: Diagnosis not present

## 2020-08-08 DIAGNOSIS — N183 Chronic kidney disease, stage 3 unspecified: Secondary | ICD-10-CM | POA: Diagnosis not present

## 2020-08-08 DIAGNOSIS — E785 Hyperlipidemia, unspecified: Secondary | ICD-10-CM | POA: Diagnosis not present

## 2020-08-08 DIAGNOSIS — I129 Hypertensive chronic kidney disease with stage 1 through stage 4 chronic kidney disease, or unspecified chronic kidney disease: Secondary | ICD-10-CM | POA: Diagnosis not present

## 2020-08-08 DIAGNOSIS — M810 Age-related osteoporosis without current pathological fracture: Secondary | ICD-10-CM | POA: Diagnosis not present

## 2020-08-08 DIAGNOSIS — R54 Age-related physical debility: Secondary | ICD-10-CM | POA: Diagnosis not present

## 2020-08-08 DIAGNOSIS — J4520 Mild intermittent asthma, uncomplicated: Secondary | ICD-10-CM | POA: Diagnosis not present

## 2020-08-08 DIAGNOSIS — K219 Gastro-esophageal reflux disease without esophagitis: Secondary | ICD-10-CM | POA: Diagnosis not present

## 2020-08-08 DIAGNOSIS — E039 Hypothyroidism, unspecified: Secondary | ICD-10-CM | POA: Diagnosis not present

## 2020-08-08 DIAGNOSIS — J449 Chronic obstructive pulmonary disease, unspecified: Secondary | ICD-10-CM | POA: Diagnosis not present

## 2020-08-21 DIAGNOSIS — E039 Hypothyroidism, unspecified: Secondary | ICD-10-CM | POA: Diagnosis not present

## 2020-09-17 DIAGNOSIS — M810 Age-related osteoporosis without current pathological fracture: Secondary | ICD-10-CM | POA: Diagnosis not present

## 2020-09-25 DIAGNOSIS — E039 Hypothyroidism, unspecified: Secondary | ICD-10-CM | POA: Diagnosis not present

## 2020-09-25 DIAGNOSIS — R600 Localized edema: Secondary | ICD-10-CM | POA: Diagnosis not present

## 2020-09-25 DIAGNOSIS — N1832 Chronic kidney disease, stage 3b: Secondary | ICD-10-CM | POA: Diagnosis not present

## 2020-09-25 DIAGNOSIS — R609 Edema, unspecified: Secondary | ICD-10-CM | POA: Diagnosis not present

## 2020-09-25 DIAGNOSIS — I129 Hypertensive chronic kidney disease with stage 1 through stage 4 chronic kidney disease, or unspecified chronic kidney disease: Secondary | ICD-10-CM | POA: Diagnosis not present

## 2020-09-25 DIAGNOSIS — M159 Polyosteoarthritis, unspecified: Secondary | ICD-10-CM | POA: Diagnosis not present

## 2020-11-12 DIAGNOSIS — E785 Hyperlipidemia, unspecified: Secondary | ICD-10-CM | POA: Diagnosis not present

## 2020-11-12 DIAGNOSIS — I129 Hypertensive chronic kidney disease with stage 1 through stage 4 chronic kidney disease, or unspecified chronic kidney disease: Secondary | ICD-10-CM | POA: Diagnosis not present

## 2020-11-12 DIAGNOSIS — R54 Age-related physical debility: Secondary | ICD-10-CM | POA: Diagnosis not present

## 2020-11-12 DIAGNOSIS — E039 Hypothyroidism, unspecified: Secondary | ICD-10-CM | POA: Diagnosis not present

## 2020-11-12 DIAGNOSIS — M810 Age-related osteoporosis without current pathological fracture: Secondary | ICD-10-CM | POA: Diagnosis not present

## 2020-11-12 DIAGNOSIS — K219 Gastro-esophageal reflux disease without esophagitis: Secondary | ICD-10-CM | POA: Diagnosis not present

## 2020-11-12 DIAGNOSIS — J4520 Mild intermittent asthma, uncomplicated: Secondary | ICD-10-CM | POA: Diagnosis not present

## 2020-11-12 DIAGNOSIS — N1832 Chronic kidney disease, stage 3b: Secondary | ICD-10-CM | POA: Diagnosis not present

## 2020-11-12 DIAGNOSIS — N183 Chronic kidney disease, stage 3 unspecified: Secondary | ICD-10-CM | POA: Diagnosis not present

## 2020-11-12 DIAGNOSIS — J449 Chronic obstructive pulmonary disease, unspecified: Secondary | ICD-10-CM | POA: Diagnosis not present

## 2020-11-22 DIAGNOSIS — H04123 Dry eye syndrome of bilateral lacrimal glands: Secondary | ICD-10-CM | POA: Diagnosis not present

## 2020-11-22 DIAGNOSIS — H01004 Unspecified blepharitis left upper eyelid: Secondary | ICD-10-CM | POA: Diagnosis not present

## 2020-11-22 DIAGNOSIS — H01001 Unspecified blepharitis right upper eyelid: Secondary | ICD-10-CM | POA: Diagnosis not present

## 2020-12-20 DIAGNOSIS — Z0000 Encounter for general adult medical examination without abnormal findings: Secondary | ICD-10-CM | POA: Diagnosis not present

## 2020-12-20 DIAGNOSIS — H9193 Unspecified hearing loss, bilateral: Secondary | ICD-10-CM | POA: Diagnosis not present

## 2020-12-20 DIAGNOSIS — R609 Edema, unspecified: Secondary | ICD-10-CM | POA: Diagnosis not present

## 2020-12-20 DIAGNOSIS — M7989 Other specified soft tissue disorders: Secondary | ICD-10-CM | POA: Diagnosis not present

## 2020-12-20 DIAGNOSIS — E785 Hyperlipidemia, unspecified: Secondary | ICD-10-CM | POA: Diagnosis not present

## 2020-12-20 DIAGNOSIS — R0600 Dyspnea, unspecified: Secondary | ICD-10-CM | POA: Diagnosis not present

## 2020-12-20 DIAGNOSIS — I129 Hypertensive chronic kidney disease with stage 1 through stage 4 chronic kidney disease, or unspecified chronic kidney disease: Secondary | ICD-10-CM | POA: Diagnosis not present

## 2020-12-20 DIAGNOSIS — N1832 Chronic kidney disease, stage 3b: Secondary | ICD-10-CM | POA: Diagnosis not present

## 2020-12-20 DIAGNOSIS — F411 Generalized anxiety disorder: Secondary | ICD-10-CM | POA: Diagnosis not present

## 2020-12-20 DIAGNOSIS — E039 Hypothyroidism, unspecified: Secondary | ICD-10-CM | POA: Diagnosis not present

## 2020-12-20 DIAGNOSIS — R54 Age-related physical debility: Secondary | ICD-10-CM | POA: Diagnosis not present

## 2020-12-21 ENCOUNTER — Other Ambulatory Visit (HOSPITAL_COMMUNITY): Payer: Self-pay | Admitting: Family Medicine

## 2020-12-21 ENCOUNTER — Other Ambulatory Visit: Payer: Self-pay

## 2020-12-21 ENCOUNTER — Ambulatory Visit (HOSPITAL_COMMUNITY)
Admission: RE | Admit: 2020-12-21 | Discharge: 2020-12-21 | Disposition: A | Discharge status: Home / Self Care | Payer: Medicare Other | Source: Ambulatory Visit | Attending: Family Medicine | Admitting: Family Medicine

## 2020-12-21 DIAGNOSIS — M79605 Pain in left leg: Secondary | ICD-10-CM | POA: Insufficient documentation

## 2020-12-21 DIAGNOSIS — M7989 Other specified soft tissue disorders: Secondary | ICD-10-CM | POA: Insufficient documentation

## 2021-01-08 DIAGNOSIS — H903 Sensorineural hearing loss, bilateral: Secondary | ICD-10-CM | POA: Diagnosis not present

## 2021-01-30 DIAGNOSIS — M533 Sacrococcygeal disorders, not elsewhere classified: Secondary | ICD-10-CM | POA: Diagnosis not present

## 2021-01-30 NOTE — Progress Notes (Signed)
Cardiology Office Note   Date:  01/31/2021   ID:  Erin Ryan, DOB 01/20/1925, MRN [id number]  PCP:  Harlan Stains, MD  Cardiologist:   None Referring:  Harlan Stains, MD  Chief Complaint  Patient presents with  . Shortness of Breath      History of Present Illness: Erin Ryan is a 87 y.o. female who presents for evaluation of SOB.   She said this has been slowly progressive.  She was evaluated in the past by pulmonary as she was told she had COPD but when she saw Dr. Melvyn Novas in 2018 he thought it was probably reflux and not COPD.  However, she is more short of breath now than she was then.  She gets short of breath walking short distance on level ground.  She is not having any PND or orthopnea.  She is not having any palpitations, presyncope or syncope.  She denies any chest pressure, neck or arm discomfort.  When she saw her primary provider she had increased lower extremity swelling.  Thought was this was likely venous insufficiency but given this and dyspnea she is referred for further evaluation.  I see that her thyroid was normal earlier this year.  Her creatinine was 1.13.  Sodium was borderline at 135.  She is not anemic.  Past Medical History:  Diagnosis Date  . Arthritis   . GERD (gastroesophageal reflux disease)   . High cholesterol   . Hypertension   . Osteoporosis   . Thyroid disease     Past Surgical History:  Procedure Laterality Date  . CATARACT EXTRACTION    . cysts removed from bilateral breasts    . THYROIDECTOMY       Current Outpatient Medications  Medication Sig Dispense Refill  . amLODipine (NORVASC) 10 MG tablet Take 10 mg by mouth daily.    Marland Kitchen aspirin 81 MG tablet Take 81 mg by mouth daily.    Marland Kitchen atorvastatin (LIPITOR) 40 MG tablet Take 40 mg by mouth daily.    . Calcium Citrate-Vitamin D (CALCIUM CITRATE + PO) Take 1 tablet 2 (two) times daily by mouth.    . irbesartan (AVAPRO) 300 MG tablet Take 1 tablet daily by mouth.    . levothyroxine  (SYNTHROID, LEVOTHROID) 112 MCG tablet Take 112 mcg by mouth daily before breakfast.    . omeprazole (PRILOSEC) 40 MG capsule Take 1 capsule (40 mg total) by mouth 2 (two) times daily before a meal. 180 capsule 3  . PARoxetine (PAXIL) 10 MG tablet Take 10 mg by mouth every morning.     No current facility-administered medications for this visit.    Allergies:   Hydrochlorothiazide and Short ragweed pollen ext    Social History:  The patient  reports that she has never smoked. She has never used smokeless tobacco. She reports that she does not drink alcohol and does not use drugs.   Family History:  The patient's family history includes Asthma in her father; Bladder Cancer in her mother; Heart attack in her brother; Heart disease in her father; Heart disease (age of onset: 22) in her mother.    ROS:  Please see the history of present illness.   Otherwise, review of systems are positive for none.   All other systems are reviewed and negative.    PHYSICAL EXAM: VS:  BP (!) 146/62   Pulse (!) 107   Ht 5\' 3"  (1.6 m)   Wt 119 lb 9.6 oz (54.3 kg)   SpO2  95%   BMI 21.19 kg/m  , BMI Body mass index is 21.19 kg/m. GENERAL:  Well appearing HEENT:  Pupils equal round and reactive, fundi not visualized, oral mucosa unremarkable NECK:  No jugular venous distention, waveform within normal limits, carotid upstroke brisk and symmetric, no bruits, no thyromegaly LYMPHATICS:  No cervical, inguinal adenopathy LUNGS:  Clear to auscultation bilaterally BACK:  No CVA tenderness CHEST:  Unremarkable HEART:  PMI not displaced or sustained,S1 and S2 within normal limits, no S3, no S4, no clicks, no rubs, no murmurs ABD:  Flat, positive bowel sounds normal in frequency in pitch, no bruits, no rebound, no guarding, no midline pulsatile mass, no hepatomegaly, no splenomegaly EXT:  2 plus pulses throughout, no edema, no cyanosis no clubbing SKIN:  No rashes no nodules NEURO:  Cranial nerves II through XII  grossly intact, motor grossly intact throughout PSYCH:  Cognitively intact, oriented to person place and time  EKG:  EKG is ordered today. The ekg ordered today demonstrates    Sinus tachycardia, rate 107, low voltage in the limb in chest leads, premature atrial contractions, no acute ST-T wave changes.   Recent Labs: No results found for requested labs within last 8760 hours.    Lipid Panel No results found for: CHOL, TRIG, HDL, CHOLHDL, VLDL, LDLCALC, LDLDIRECT    Wt Readings from Last 3 Encounters:  01/31/21 119 lb 9.6 oz (54.3 kg)  09/11/17 113 lb (51.3 kg)  07/29/17 110 lb 12.8 oz (50.3 kg)      Other studies Reviewed: Additional studies/ records that were reviewed today include: Primary care records, labs. Review of the above records demonstrates:  Please see elsewhere in the note.     ASSESSMENT AND PLAN:  SOB:    At this point I do not see any overt suggestion of heart failure but she does have low voltages and this complaint.  I am going to check a BNP level and an echocardiogram.  She was dyspneic walking around the office but did not drop her oxygen sats.  Until I have the above results and not planning to change medications.  Of note I do hear some bowel sounds in her chest and she has small hiatal hernia years ago on a CT.  If the above testing is unremarkable I might get a noncontrast chest CT we will  HTN: Her blood pressure is mildly elevated I would suggest a blood pressure diary.  TACHYCARDIA: This is sinus tachycardia.  However, it is a resting rapid heart rate and I will assess as above and have asked him to keep a heart rate diary.    Current medicines are reviewed at length with the patient today.  The patient does not have concerns regarding medicines.  The following changes have been made:  no change  Labs/ tests ordered today include:   Orders Placed This Encounter  Procedures  . Pro b natriuretic peptide (BNP)[id number])  .  EKG 12-Lead  . ECHOCARDIOGRAM COMPLETE     Disposition:   FU with me after the echo.     Signed, Minus Breeding, MD  01/31/2021 1:25 PM    [facility]

## 2021-01-31 ENCOUNTER — Other Ambulatory Visit: Payer: Self-pay

## 2021-01-31 ENCOUNTER — Encounter: Payer: Self-pay | Admitting: Cardiology

## 2021-01-31 ENCOUNTER — Ambulatory Visit (INDEPENDENT_AMBULATORY_CARE_PROVIDER_SITE_OTHER): Payer: Medicare Other | Admitting: Cardiology

## 2021-01-31 VITALS — BP 146/62 | HR 107 | Ht 63.0 in | Wt 119.6 lb

## 2021-01-31 DIAGNOSIS — R0602 Shortness of breath: Secondary | ICD-10-CM | POA: Diagnosis not present

## 2021-01-31 DIAGNOSIS — I10 Essential (primary) hypertension: Secondary | ICD-10-CM | POA: Diagnosis not present

## 2021-01-31 NOTE — Patient Instructions (Signed)
  Lab Work:  Your physician recommends that you HAVE LAB WORK TODAY  If you have labs (blood work) drawn today and your tests are completely normal, you will receive your results only by: Marland Kitchen MyChart Message (if you have MyChart) OR . A paper copy in the mail If you have any lab test that is abnormal or we need to change your treatment, we will call you to review the results.   Testing/Procedures:  Your physician has requested that you have an echocardiogram. Echocardiography is a painless test that uses sound waves to create images of your heart. It provides your doctor with information about the size and shape of your heart and how well your heart's chambers and valves are working. This procedure takes approximately one hour. There are no restrictions for this procedure.[place]   Follow-Up: At [facility], you and your health needs are our priority.  As part of our continuing mission to provide you with exceptional heart care, we have created designated Provider Care Teams.  These Care Teams include your primary Cardiologist (physician) and Advanced Practice Providers (APPs -  Physician Assistants and Nurse Practitioners) who all work together to provide you with the care you need, when you need it.  We recommend signing up for the patient portal called "MyChart".  Sign up information is provided on this After Visit Summary.  MyChart is used to connect with patients for Virtual Visits (Telemedicine).  Patients are able to view lab/test results, encounter notes, upcoming appointments, etc.  Non-urgent messages can be sent to your provider as well.   To learn more about what you can do with MyChart, go to [email or web address].    Your next appointment:   AFTER ECHO COMPLETE  The format for your next appointment:   In Person  Provider:   Minus Breeding, MD   Other Instructions TRACK HEART RATE AND OXYGEN LEVEL 3 X DAILY- IN THE MORNING, AFTERNOON AND AT NIGHT WHEN  IN BED RESTING

## 2021-02-01 LAB — PRO B NATRIURETIC PEPTIDE: NT-Pro BNP: 1216 pg/mL — ABNORMAL HIGH (ref 0–738)

## 2021-02-06 DIAGNOSIS — J4520 Mild intermittent asthma, uncomplicated: Secondary | ICD-10-CM | POA: Diagnosis not present

## 2021-02-06 DIAGNOSIS — E039 Hypothyroidism, unspecified: Secondary | ICD-10-CM | POA: Diagnosis not present

## 2021-02-06 DIAGNOSIS — R54 Age-related physical debility: Secondary | ICD-10-CM | POA: Diagnosis not present

## 2021-02-06 DIAGNOSIS — J449 Chronic obstructive pulmonary disease, unspecified: Secondary | ICD-10-CM | POA: Diagnosis not present

## 2021-02-06 DIAGNOSIS — K219 Gastro-esophageal reflux disease without esophagitis: Secondary | ICD-10-CM | POA: Diagnosis not present

## 2021-02-06 DIAGNOSIS — E785 Hyperlipidemia, unspecified: Secondary | ICD-10-CM | POA: Diagnosis not present

## 2021-02-06 DIAGNOSIS — I129 Hypertensive chronic kidney disease with stage 1 through stage 4 chronic kidney disease, or unspecified chronic kidney disease: Secondary | ICD-10-CM | POA: Diagnosis not present

## 2021-02-06 DIAGNOSIS — M810 Age-related osteoporosis without current pathological fracture: Secondary | ICD-10-CM | POA: Diagnosis not present

## 2021-02-14 DIAGNOSIS — Z23 Encounter for immunization: Secondary | ICD-10-CM | POA: Diagnosis not present

## 2021-02-15 DIAGNOSIS — M4180 Other forms of scoliosis, site unspecified: Secondary | ICD-10-CM | POA: Diagnosis not present

## 2021-02-25 DIAGNOSIS — H31001 Unspecified chorioretinal scars, right eye: Secondary | ICD-10-CM | POA: Diagnosis not present

## 2021-02-25 DIAGNOSIS — H04123 Dry eye syndrome of bilateral lacrimal glands: Secondary | ICD-10-CM | POA: Diagnosis not present

## 2021-02-25 DIAGNOSIS — Z961 Presence of intraocular lens: Secondary | ICD-10-CM | POA: Diagnosis not present

## 2021-02-25 DIAGNOSIS — H52203 Unspecified astigmatism, bilateral: Secondary | ICD-10-CM | POA: Diagnosis not present

## 2021-03-01 ENCOUNTER — Other Ambulatory Visit: Payer: Self-pay

## 2021-03-01 ENCOUNTER — Ambulatory Visit (HOSPITAL_COMMUNITY): Payer: Medicare Other | Attending: Cardiology

## 2021-03-01 DIAGNOSIS — R0602 Shortness of breath: Secondary | ICD-10-CM | POA: Insufficient documentation

## 2021-03-01 LAB — ECHOCARDIOGRAM COMPLETE
Area-P 1/2: 2.33 cm2
S' Lateral: 2.2 cm

## 2021-03-01 NOTE — Progress Notes (Signed)
Patient ID: Erin Ryan, female   DOB: [birth year], 87 y.o.   MRN: [id number]  Echocardiogram 2D Echocardiogram has been performed.  Jennette Dubin 03/01/21

## 2021-03-03 NOTE — Progress Notes (Signed)
Cardiology Office Note   Date:  03/04/2021   ID:  Erin Ryan, DOB May 21, 1925, MRN [id number]  PCP:  Harlan Stains, MD  Cardiologist:   None Referring:  Harlan Stains, MD  Chief Complaint  Patient presents with   Shortness of Breath       History of Present Illness: Erin Ryan is a 87 y.o. female who presents for evaluation of SOB.   She said this has been slowly progressive.  She was evaluated in the past by pulmonary as she was told she had COPD but when she saw Dr. Melvyn Novas in 2018 he thought it was probably reflux and not COPD.   When I saw her for the first time recently I checked a BNP and this was markedly elevated at 1216.    Echo was unremarkable.    There was mild LVH she brings her oxygen saturations at her O2 sats are always in the 90s.  She does have tachycardia with heart rates typically in the 90s sometimes above 112.  There does seem to be correlation to heart rate being faster and the oxygen levels being lower although this.  This correlation.  It does seem like her heart rates go down.  Today she says she is not having any worsening symptoms.  She will get dyspneic walking on level ground a moderate distance but she is comfortable.  She is not describing resting shortness of breath, PND or orthopnea.  She has not been having any stations, presyncope or syncope.  She is not having any chest pressure, neck or arm discomfort.   Past Medical History:  Diagnosis Date   Arthritis    GERD (gastroesophageal reflux disease)    High cholesterol    Hypertension    Osteoporosis    Thyroid disease     Past Surgical History:  Procedure Laterality Date   CATARACT EXTRACTION     cysts removed from bilateral breasts     THYROIDECTOMY       Current Outpatient Medications  Medication Sig Dispense Refill   aspirin 81 MG tablet Take 81 mg by mouth daily.     atorvastatin (LIPITOR) 40 MG tablet Take 40 mg by mouth daily.     Calcium Citrate-Vitamin D (CALCIUM CITRATE +  PO) Take 1 tablet 2 (two) times daily by mouth.     denosumab (PROLIA) 60 MG/ML SOSY injection See admin instructions.     diltiazem (CARDIZEM CD) 180 MG 24 hr capsule Take 1 capsule (180 mg total) by mouth daily. 90 capsule 3   furosemide (LASIX) 20 MG tablet Take 20 mg daily for 2 days. Keep remaining tablets as needed 20 tablet 0   irbesartan (AVAPRO) 300 MG tablet Take 1 tablet daily by mouth.     levothyroxine (SYNTHROID) 125 MCG tablet daw-1 1 tablet on an empty stomach in the morning     omeprazole (PRILOSEC) 40 MG capsule Take 1 capsule (40 mg total) by mouth 2 (two) times daily before a meal. 180 capsule 3   PARoxetine (PAXIL) 10 MG tablet Take 10 mg by mouth every morning.     No current facility-administered medications for this visit.    Allergies:   Hydrochlorothiazide and Short ragweed pollen ext    ROS:  Please see the history of present illness.   Otherwise, review of systems are positive for none.   All other systems are reviewed and negative.    PHYSICAL EXAM: VS:  BP (!) 138/58 (BP Location: Left Arm, Patient  Position: Sitting, Cuff Size: Normal)   Pulse (!) 104   Ht 5\' 2"  (1.575 m)   Wt 119 lb 3.2 oz (54.1 kg)   BMI 21.80 kg/m  , BMI Body mass index is 21.8 kg/m. GENERAL:  Well appearing NECK:  No jugular venous distention, waveform within normal limits, carotid upstroke brisk and symmetric, no bruits, no thyromegaly LUNGS:  Clear to auscultation bilaterally CHEST:  Unremarkable HEART:  PMI not displaced or sustained,S1 and S2 within normal limits, no S3, no S4, no clicks, no rubs, no murmurs ABD:  Flat, positive bowel sounds normal in frequency in pitch, no bruits, no rebound, no guarding, no midline pulsatile mass, no hepatomegaly, no splenomegaly EXT:  2 plus pulses throughout, no edema, no cyanosis no clubbing   EKG:  EKG is   ordered today. The ekg ordered today demonstrates    Sinus tachycardia, rate 100, low voltage in the limb in chest leads, premature  atrial contractions, no acute ST-T wave changes.   Recent Labs: 01/31/2021: NT-Pro BNP 1,216    Lipid Panel No results found for: CHOL, TRIG, HDL, CHOLHDL, VLDL, LDLCALC, LDLDIRECT    Wt Readings from Last 3 Encounters:  03/04/21 119 lb 3.2 oz (54.1 kg)  01/31/21 119 lb 9.6 oz (54.3 kg)  09/11/17 113 lb (51.3 kg)      Other studies Reviewed: Additional studies/ records that were reviewed today include: Echo, labs, BP diary.  Review of the above records demonstrates:  Please see elsewhere in the note.     ASSESSMENT AND PLAN:  SOB:     I do not strongly suspect that she had an ischemic etiology.  Her only real objective findings are the elevated BNP sinus tachycardia.  I am going to give her a couple of days of Lasix.  I am going to switch her from amlodipine to Cardizem 180 mg daily.   This is for some presumed diastolic dysfunction contributing.  I do not think further testing is indicated as she is not in distress and we can pursue conservative therapies.  HTN: Her blood pressure is mildly elevated will be treated in the cot next of managing what I presume is diastolic dysfunction.  TACHYCARDIA: This is sinus tachycardia.  This will be managed as above.  Current medicines are reviewed at length with the patient today.  The patient does not have concerns regarding medicines.  The following changes have been made: As above  Labs/ tests ordered today include: None  No orders of the defined types were placed in this encounter.    Disposition:   FU with me APP in 3 months.    Signed, Minus Breeding, MD  03/04/2021 11:47 AM     [facility]

## 2021-03-04 ENCOUNTER — Encounter: Payer: Self-pay | Admitting: Cardiology

## 2021-03-04 ENCOUNTER — Other Ambulatory Visit: Payer: Self-pay

## 2021-03-04 ENCOUNTER — Ambulatory Visit (INDEPENDENT_AMBULATORY_CARE_PROVIDER_SITE_OTHER): Payer: Medicare Other | Admitting: Cardiology

## 2021-03-04 VITALS — BP 138/58 | HR 104 | Ht 62.0 in | Wt 119.2 lb

## 2021-03-04 DIAGNOSIS — I10 Essential (primary) hypertension: Secondary | ICD-10-CM | POA: Diagnosis not present

## 2021-03-04 DIAGNOSIS — R0602 Shortness of breath: Secondary | ICD-10-CM | POA: Diagnosis not present

## 2021-03-04 MED ORDER — FUROSEMIDE 20 MG PO TABS: ORAL_TABLET | ORAL | 0 refills | Status: DC

## 2021-03-04 MED ORDER — DILTIAZEM HCL ER COATED BEADS 180 MG PO CP24: 180.0000 mg | ORAL_CAPSULE | Freq: Every day | ORAL | 3 refills | Status: DC

## 2021-03-04 NOTE — Patient Instructions (Signed)
Medication Instructions:  Stop AMLODIPINE Start Cardizem 180 mg daily  Take Lasix 20 mg daily for 2 days.   *If you need a refill on your cardiac medications before your next appointment, please call your pharmacy*   Follow-Up: At [facility], you and your health needs are our priority.  As part of our continuing mission to provide you with exceptional heart care, we have created designated Provider Care Teams.  These Care Teams include your primary Cardiologist (physician) and Advanced Practice Providers (APPs -  Physician Assistants and Nurse Practitioners) who all work together to provide you with the care you need, when you need it.  We recommend signing up for the patient portal called "MyChart".  Sign up information is provided on this After Visit Summary.  MyChart is used to connect with patients for Virtual Visits (Telemedicine).  Patients are able to view lab/test results, encounter notes, upcoming appointments, etc.  Non-urgent messages can be sent to your provider as well.   To learn more about what you can do with MyChart, go to [email or web address].    Your next appointment:   3 month(s)  The format for your next appointment:   In Person  Provider:   You will see one of the following Advanced Practice Providers on your designated Care Team:   Rosaria Ferries, PA-C Jory Sims, DNP, ANP

## 2021-03-13 ENCOUNTER — Other Ambulatory Visit (INDEPENDENT_AMBULATORY_CARE_PROVIDER_SITE_OTHER): Payer: Medicare Other

## 2021-03-13 DIAGNOSIS — I10 Essential (primary) hypertension: Secondary | ICD-10-CM

## 2021-03-21 ENCOUNTER — Telehealth: Payer: Self-pay | Admitting: Cardiology

## 2021-03-21 DIAGNOSIS — R21 Rash and other nonspecific skin eruption: Secondary | ICD-10-CM | POA: Diagnosis not present

## 2021-03-21 NOTE — Telephone Encounter (Signed)
Spoke to patient's daughter [name].She stated mother has a red rash all over her body.Stated rash started on abdomen then spread all over body.Stated she took her to a Urgent Care today and was told could be Diltiazem.She started on Diltiazem about 2 weeks ago.Stated pulse today 104 B/P 148/76.She has stopped Diltiazem.She wanted to know if she needed to restart Amlodipine.Advised I will send message to Dr.Hochrein for advice.

## 2021-03-21 NOTE — Telephone Encounter (Signed)
Pt c/o medication issue:  1. Name of Medication: diltiazem (CARDIZEM CD) 180 MG 24 hr capsule  2. How are you currently taking this medication (dosage and times per day)? 1 tablet by mouth  3. Are you having a reaction (difficulty breathing--STAT)? no  4. What is your medication issue? She has rashes all over her body and its causing her to itch really bad. She is uncomfortable.  Patient's daughter took her to [facility] and they think she is having allergic reaction to the medication. Daughter would like for her to go back on the pill that the dr took her off of. Please advise

## 2021-03-22 MED ORDER — METOPROLOL TARTRATE 25 MG PO TABS: 25.0000 mg | ORAL_TABLET | Freq: Two times a day (BID) | ORAL | 3 refills | Status: DC

## 2021-03-22 NOTE — Addendum Note (Signed)
Addended by: Kathyrn Lass on: 03/22/2021 11:07 AM   Modules accepted: Orders

## 2021-03-22 NOTE — Telephone Encounter (Signed)
Spoke to patient's daughter [name] Dr.Hochrein's advice given.Stated she wants to wait a couple of days before starting Metoprolol.She wanted rash to clear up.Advised ok to start Metoprolol 25 mg twice a day.Advised to call back if she is not better.

## 2021-03-26 ENCOUNTER — Other Ambulatory Visit: Payer: Self-pay | Admitting: Cardiology

## 2021-04-05 DIAGNOSIS — M810 Age-related osteoporosis without current pathological fracture: Secondary | ICD-10-CM | POA: Diagnosis not present

## 2021-04-26 DIAGNOSIS — I129 Hypertensive chronic kidney disease with stage 1 through stage 4 chronic kidney disease, or unspecified chronic kidney disease: Secondary | ICD-10-CM | POA: Diagnosis not present

## 2021-04-26 DIAGNOSIS — E039 Hypothyroidism, unspecified: Secondary | ICD-10-CM | POA: Diagnosis not present

## 2021-04-26 DIAGNOSIS — M810 Age-related osteoporosis without current pathological fracture: Secondary | ICD-10-CM | POA: Diagnosis not present

## 2021-04-26 DIAGNOSIS — R54 Age-related physical debility: Secondary | ICD-10-CM | POA: Diagnosis not present

## 2021-04-26 DIAGNOSIS — J4520 Mild intermittent asthma, uncomplicated: Secondary | ICD-10-CM | POA: Diagnosis not present

## 2021-04-26 DIAGNOSIS — E785 Hyperlipidemia, unspecified: Secondary | ICD-10-CM | POA: Diagnosis not present

## 2021-04-26 DIAGNOSIS — N183 Chronic kidney disease, stage 3 unspecified: Secondary | ICD-10-CM | POA: Diagnosis not present

## 2021-04-26 DIAGNOSIS — K219 Gastro-esophageal reflux disease without esophagitis: Secondary | ICD-10-CM | POA: Diagnosis not present

## 2021-04-26 DIAGNOSIS — J449 Chronic obstructive pulmonary disease, unspecified: Secondary | ICD-10-CM | POA: Diagnosis not present

## 2021-05-09 DIAGNOSIS — M545 Low back pain, unspecified: Secondary | ICD-10-CM | POA: Diagnosis not present

## 2021-05-09 DIAGNOSIS — M5136 Other intervertebral disc degeneration, lumbar region: Secondary | ICD-10-CM | POA: Diagnosis not present

## 2021-05-09 DIAGNOSIS — M533 Sacrococcygeal disorders, not elsewhere classified: Secondary | ICD-10-CM | POA: Diagnosis not present

## 2021-05-09 DIAGNOSIS — M4180 Other forms of scoliosis, site unspecified: Secondary | ICD-10-CM | POA: Diagnosis not present

## 2021-05-14 DIAGNOSIS — M7989 Other specified soft tissue disorders: Secondary | ICD-10-CM | POA: Diagnosis not present

## 2021-05-14 DIAGNOSIS — G8929 Other chronic pain: Secondary | ICD-10-CM | POA: Diagnosis not present

## 2021-05-14 DIAGNOSIS — M5136 Other intervertebral disc degeneration, lumbar region: Secondary | ICD-10-CM | POA: Diagnosis not present

## 2021-05-14 DIAGNOSIS — M533 Sacrococcygeal disorders, not elsewhere classified: Secondary | ICD-10-CM | POA: Diagnosis not present

## 2021-05-14 DIAGNOSIS — F32A Depression, unspecified: Secondary | ICD-10-CM | POA: Diagnosis not present

## 2021-05-14 DIAGNOSIS — N289 Disorder of kidney and ureter, unspecified: Secondary | ICD-10-CM | POA: Diagnosis not present

## 2021-05-14 DIAGNOSIS — I10 Essential (primary) hypertension: Secondary | ICD-10-CM | POA: Diagnosis not present

## 2021-05-14 DIAGNOSIS — Z7982 Long term (current) use of aspirin: Secondary | ICD-10-CM | POA: Diagnosis not present

## 2021-05-14 DIAGNOSIS — H9193 Unspecified hearing loss, bilateral: Secondary | ICD-10-CM | POA: Diagnosis not present

## 2021-05-14 DIAGNOSIS — K219 Gastro-esophageal reflux disease without esophagitis: Secondary | ICD-10-CM | POA: Diagnosis not present

## 2021-05-14 DIAGNOSIS — M4180 Other forms of scoliosis, site unspecified: Secondary | ICD-10-CM | POA: Diagnosis not present

## 2021-05-14 DIAGNOSIS — M1990 Unspecified osteoarthritis, unspecified site: Secondary | ICD-10-CM | POA: Diagnosis not present

## 2021-05-14 DIAGNOSIS — E7800 Pure hypercholesterolemia, unspecified: Secondary | ICD-10-CM | POA: Diagnosis not present

## 2021-05-14 DIAGNOSIS — E049 Nontoxic goiter, unspecified: Secondary | ICD-10-CM | POA: Diagnosis not present

## 2021-05-14 DIAGNOSIS — M810 Age-related osteoporosis without current pathological fracture: Secondary | ICD-10-CM | POA: Diagnosis not present

## 2021-05-14 DIAGNOSIS — Z9181 History of falling: Secondary | ICD-10-CM | POA: Diagnosis not present

## 2021-05-22 DIAGNOSIS — M533 Sacrococcygeal disorders, not elsewhere classified: Secondary | ICD-10-CM | POA: Diagnosis not present

## 2021-05-23 DIAGNOSIS — G8929 Other chronic pain: Secondary | ICD-10-CM | POA: Diagnosis not present

## 2021-05-23 DIAGNOSIS — M810 Age-related osteoporosis without current pathological fracture: Secondary | ICD-10-CM | POA: Diagnosis not present

## 2021-05-23 DIAGNOSIS — M533 Sacrococcygeal disorders, not elsewhere classified: Secondary | ICD-10-CM | POA: Diagnosis not present

## 2021-05-23 DIAGNOSIS — I10 Essential (primary) hypertension: Secondary | ICD-10-CM | POA: Diagnosis not present

## 2021-05-23 DIAGNOSIS — M4180 Other forms of scoliosis, site unspecified: Secondary | ICD-10-CM | POA: Diagnosis not present

## 2021-05-23 DIAGNOSIS — M5136 Other intervertebral disc degeneration, lumbar region: Secondary | ICD-10-CM | POA: Diagnosis not present

## 2021-05-28 DIAGNOSIS — I10 Essential (primary) hypertension: Secondary | ICD-10-CM | POA: Diagnosis not present

## 2021-05-28 DIAGNOSIS — M810 Age-related osteoporosis without current pathological fracture: Secondary | ICD-10-CM | POA: Diagnosis not present

## 2021-05-28 DIAGNOSIS — M5136 Other intervertebral disc degeneration, lumbar region: Secondary | ICD-10-CM | POA: Diagnosis not present

## 2021-05-28 DIAGNOSIS — M533 Sacrococcygeal disorders, not elsewhere classified: Secondary | ICD-10-CM | POA: Diagnosis not present

## 2021-05-28 DIAGNOSIS — M4180 Other forms of scoliosis, site unspecified: Secondary | ICD-10-CM | POA: Diagnosis not present

## 2021-05-28 DIAGNOSIS — G8929 Other chronic pain: Secondary | ICD-10-CM | POA: Diagnosis not present

## 2021-06-04 DIAGNOSIS — M533 Sacrococcygeal disorders, not elsewhere classified: Secondary | ICD-10-CM | POA: Diagnosis not present

## 2021-06-04 DIAGNOSIS — I10 Essential (primary) hypertension: Secondary | ICD-10-CM | POA: Diagnosis not present

## 2021-06-04 DIAGNOSIS — M810 Age-related osteoporosis without current pathological fracture: Secondary | ICD-10-CM | POA: Diagnosis not present

## 2021-06-04 DIAGNOSIS — G8929 Other chronic pain: Secondary | ICD-10-CM | POA: Diagnosis not present

## 2021-06-04 DIAGNOSIS — M4180 Other forms of scoliosis, site unspecified: Secondary | ICD-10-CM | POA: Diagnosis not present

## 2021-06-04 DIAGNOSIS — M5136 Other intervertebral disc degeneration, lumbar region: Secondary | ICD-10-CM | POA: Diagnosis not present

## 2021-06-07 ENCOUNTER — Ambulatory Visit: Payer: Medicare Other | Admitting: Physician Assistant

## 2021-06-07 ENCOUNTER — Ambulatory Visit: Payer: Medicare Other | Admitting: Adult Health

## 2021-06-10 DIAGNOSIS — M533 Sacrococcygeal disorders, not elsewhere classified: Secondary | ICD-10-CM | POA: Diagnosis not present

## 2021-06-10 DIAGNOSIS — M4180 Other forms of scoliosis, site unspecified: Secondary | ICD-10-CM | POA: Diagnosis not present

## 2021-06-10 DIAGNOSIS — M810 Age-related osteoporosis without current pathological fracture: Secondary | ICD-10-CM | POA: Diagnosis not present

## 2021-06-10 DIAGNOSIS — G8929 Other chronic pain: Secondary | ICD-10-CM | POA: Diagnosis not present

## 2021-06-10 DIAGNOSIS — I10 Essential (primary) hypertension: Secondary | ICD-10-CM | POA: Diagnosis not present

## 2021-06-10 DIAGNOSIS — M5136 Other intervertebral disc degeneration, lumbar region: Secondary | ICD-10-CM | POA: Diagnosis not present

## 2021-06-13 DIAGNOSIS — K219 Gastro-esophageal reflux disease without esophagitis: Secondary | ICD-10-CM | POA: Diagnosis not present

## 2021-06-13 DIAGNOSIS — G8929 Other chronic pain: Secondary | ICD-10-CM | POA: Diagnosis not present

## 2021-06-13 DIAGNOSIS — H9193 Unspecified hearing loss, bilateral: Secondary | ICD-10-CM | POA: Diagnosis not present

## 2021-06-13 DIAGNOSIS — F411 Generalized anxiety disorder: Secondary | ICD-10-CM | POA: Diagnosis not present

## 2021-06-13 DIAGNOSIS — Z79899 Other long term (current) drug therapy: Secondary | ICD-10-CM | POA: Diagnosis not present

## 2021-06-13 DIAGNOSIS — N289 Disorder of kidney and ureter, unspecified: Secondary | ICD-10-CM | POA: Diagnosis not present

## 2021-06-13 DIAGNOSIS — M533 Sacrococcygeal disorders, not elsewhere classified: Secondary | ICD-10-CM | POA: Diagnosis not present

## 2021-06-13 DIAGNOSIS — F32A Depression, unspecified: Secondary | ICD-10-CM | POA: Diagnosis not present

## 2021-06-13 DIAGNOSIS — I129 Hypertensive chronic kidney disease with stage 1 through stage 4 chronic kidney disease, or unspecified chronic kidney disease: Secondary | ICD-10-CM | POA: Diagnosis not present

## 2021-06-13 DIAGNOSIS — I10 Essential (primary) hypertension: Secondary | ICD-10-CM | POA: Diagnosis not present

## 2021-06-13 DIAGNOSIS — N1832 Chronic kidney disease, stage 3b: Secondary | ICD-10-CM | POA: Diagnosis not present

## 2021-06-13 DIAGNOSIS — Z9181 History of falling: Secondary | ICD-10-CM | POA: Diagnosis not present

## 2021-06-13 DIAGNOSIS — R413 Other amnesia: Secondary | ICD-10-CM | POA: Diagnosis not present

## 2021-06-13 DIAGNOSIS — R350 Frequency of micturition: Secondary | ICD-10-CM | POA: Diagnosis not present

## 2021-06-13 DIAGNOSIS — M1990 Unspecified osteoarthritis, unspecified site: Secondary | ICD-10-CM | POA: Diagnosis not present

## 2021-06-13 DIAGNOSIS — M4180 Other forms of scoliosis, site unspecified: Secondary | ICD-10-CM | POA: Diagnosis not present

## 2021-06-13 DIAGNOSIS — M5136 Other intervertebral disc degeneration, lumbar region: Secondary | ICD-10-CM | POA: Diagnosis not present

## 2021-06-13 DIAGNOSIS — E039 Hypothyroidism, unspecified: Secondary | ICD-10-CM | POA: Diagnosis not present

## 2021-06-13 DIAGNOSIS — M7989 Other specified soft tissue disorders: Secondary | ICD-10-CM | POA: Diagnosis not present

## 2021-06-13 DIAGNOSIS — E785 Hyperlipidemia, unspecified: Secondary | ICD-10-CM | POA: Diagnosis not present

## 2021-06-13 DIAGNOSIS — Z7982 Long term (current) use of aspirin: Secondary | ICD-10-CM | POA: Diagnosis not present

## 2021-06-13 DIAGNOSIS — E7800 Pure hypercholesterolemia, unspecified: Secondary | ICD-10-CM | POA: Diagnosis not present

## 2021-06-13 DIAGNOSIS — E049 Nontoxic goiter, unspecified: Secondary | ICD-10-CM | POA: Diagnosis not present

## 2021-06-13 DIAGNOSIS — M810 Age-related osteoporosis without current pathological fracture: Secondary | ICD-10-CM | POA: Diagnosis not present

## 2021-06-25 DIAGNOSIS — G8929 Other chronic pain: Secondary | ICD-10-CM | POA: Diagnosis not present

## 2021-06-25 DIAGNOSIS — M533 Sacrococcygeal disorders, not elsewhere classified: Secondary | ICD-10-CM | POA: Diagnosis not present

## 2021-06-25 DIAGNOSIS — I10 Essential (primary) hypertension: Secondary | ICD-10-CM | POA: Diagnosis not present

## 2021-06-25 DIAGNOSIS — M5136 Other intervertebral disc degeneration, lumbar region: Secondary | ICD-10-CM | POA: Diagnosis not present

## 2021-06-25 DIAGNOSIS — M4180 Other forms of scoliosis, site unspecified: Secondary | ICD-10-CM | POA: Diagnosis not present

## 2021-06-25 DIAGNOSIS — M810 Age-related osteoporosis without current pathological fracture: Secondary | ICD-10-CM | POA: Diagnosis not present

## 2021-07-02 DIAGNOSIS — N390 Urinary tract infection, site not specified: Secondary | ICD-10-CM | POA: Diagnosis not present

## 2021-07-04 NOTE — Progress Notes (Signed)
Cardiology Office Note   Date:  07/05/2021   ID:  Erin Ryan, DOB 22-Oct-1924, MRN [id number]  PCP:  Harlan Stains, MD  Cardiologist:  Dr. Percival Spanish  CC: Weakness and shortness of breath with cough   History of Present Illness: Erin Ryan is a 87 y.o. female who presents for ongoing assessment and management of shortness of breath. She was last seen by Dr. Percival Spanish on 03/04/2021 for these complaints. She was found to have an elevated BNP and sinus tachycardia. She was started on lasix, amlodipine was discontinued, and started on Diltiazem 180 mg daily. Due to her age, she was to have conservative management. She has other history of GERD, hypertension, HL, osteoporosis and thyroid disease.   She unfortunately had a rash after starting diltiazem and was taken to Urgent Care by her daughter. Dr. Percival Spanish started her on metoprolol 25 mg BID and stopped diltiazem.   She comes today with her daughter in attendance.  Her daughter does a lot of the explaining of symptoms.  The patient is hard of hearing.  The rash has cleared but her daughter is concerned about her mother's increasing weakness and shortness of breath.  She states that she is easily short of breath just walking from her bedroom into the kitchen.  She continues to have a tickle cough which has been aggravating her for several months.  She continues to have some issues with confusion.  Her primary care physician believes this may be related to a UTI and she has been placed on Keflex.  However there are concerns that early dementia may be possible.  When her daughter goes out of town she make sure that there is someone with her 24 hours a day to look after her.  Her daughter is very attentive.   Past Medical History:  Diagnosis Date   Arthritis    GERD (gastroesophageal reflux disease)    High cholesterol    Hypertension    Osteoporosis    Thyroid disease     Past Surgical History:  Procedure Laterality Date   CATARACT  EXTRACTION     cysts removed from bilateral breasts     THYROIDECTOMY       Current Outpatient Medications  Medication Sig Dispense Refill   amLODipine (NORVASC) 5 MG tablet Take 1 tablet (5 mg total) by mouth daily. 90 tablet 1   aspirin 81 MG tablet Take 81 mg by mouth daily.     atorvastatin (LIPITOR) 40 MG tablet Take 40 mg by mouth daily.     Calcium Citrate-Vitamin D (CALCIUM CITRATE + PO) Take 1 tablet 2 (two) times daily by mouth.     cephALEXin (KEFLEX) 500 MG capsule Take 500 mg by mouth 3 (three) times daily.     denosumab (PROLIA) 60 MG/ML SOSY injection See admin instructions.     furosemide (LASIX) 20 MG tablet Take 1 tablet (20 mg total) by mouth every other day. 90 tablet 0   levothyroxine (SYNTHROID) 125 MCG tablet daw-1 1 tablet on an empty stomach in the morning     metoprolol tartrate (LOPRESSOR) 25 MG tablet Take 0.5 tablets (12.5 mg total) by mouth 2 (two) times daily. 180 tablet 1   omeprazole (PRILOSEC) 40 MG capsule Take 1 capsule (40 mg total) by mouth 2 (two) times daily before a meal. 180 capsule 3   PARoxetine (PAXIL) 10 MG tablet Take 10 mg by mouth every morning.     No current facility-administered medications for this visit.  Allergies:   Hydrochlorothiazide and Short ragweed pollen ext    Social History:  The patient  reports that she has never smoked. She has never used smokeless tobacco. She reports that she does not drink alcohol and does not use drugs.   Family History:  The patient's family history includes Asthma in her father; Bladder Cancer in her mother; Heart attack in her brother; Heart disease in her father; Heart disease (age of onset: 74) in her mother.    ROS: All other systems are reviewed and negative. Unless otherwise mentioned in H&P    PHYSICAL EXAM: VS:  BP (!) 145/70   Pulse 75   Ht 5' 1.2" (1.554 m)   Wt 116 lb 12.8 oz (53 kg)   SpO2 95%   BMI 21.93 kg/m  , BMI Body mass index is 21.93 kg/m. GEN: Well nourished,  well developed, in no acute distress HEENT: normal Neck: no JVD, carotid bruits, or masses Cardiac: RRR; no murmurs, rubs, or gallops,no edema  Respiratory:  Clear to auscultation bilaterally, normal work of breathing GI: soft, nontender, nondistended, + BS MS: no deformity or atrophy Skin: warm and dry, no rash Neuro:  Strength and sensation are intact Psych: euthymic mood, full affect   EKG:  EKG is not ordered today.   Recent Labs: 01/31/2021: NT-Pro BNP 1,216    Lipid Panel No results found for: CHOL, TRIG, HDL, CHOLHDL, VLDL, LDLCALC, LDLDIRECT    Wt Readings from Last 3 Encounters:  07/05/21 116 lb 12.8 oz (53 kg)  03/04/21 119 lb 3.2 oz (54.1 kg)  01/31/21 119 lb 9.6 oz (54.3 kg)      Other studies Reviewed: Echocardiogram 03-30-19 1. Left ventricular ejection fraction, by estimation, is 55 to 60%. Left  ventricular ejection fraction by 3D volume is 56 %. The left ventricle has  normal function. The left ventricle has no regional wall motion  abnormalities. Left ventricular diastolic   parameters are consistent with Grade I diastolic dysfunction (impaired  relaxation). Elevated left atrial pressure. The E/e' is 29.   2. Right ventricular systolic function is normal. The right ventricular  size is normal. There is normal pulmonary artery systolic pressure. The  estimated right ventricular systolic pressure is 21.3 mmHg.   3. The mitral valve is grossly normal. Trivial mitral valve  regurgitation. Moderate mitral annular calcification.   4. The aortic valve is tricuspid. There is mild thickening of the aortic  valve. Aortic valve regurgitation is trivial. Mild aortic valve sclerosis  is present, with no evidence of aortic valve stenosis.   5. The inferior vena cava is normal in size with greater than 50%  respiratory variability, suggesting right atrial pressure of 3 mmHg.    ASSESSMENT AND PLAN:  1.  Hypertension: The patient has a chronic tickle cough and  this may be related to irbesartan.  She states that this is really worrisome to her and her daughter agrees.  She had been on amlodipine in the past and I will start her back on this at 5 mg daily and discontinue irbesartan.  2.  Chronic diastolic heart failure: She is only been using Lasix as needed.  Her daughter does not want her to become dehydrated.  However she is having some shortness of breath and mild lower extremity edema.  I have asked her to take Lasix 20 mg every other day.  This will also help with lower extremity edema which can be caused by the amlodipine.    I have explained  to her daughter that she may have some lower extremity edema as result of the amlodipine.  I will also decrease her metoprolol to 12.5 mg twice daily as she is extremely fatigued.  Heart rate is 62 on auscultation.  She will be seen again on November 4 in the morning for reevaluation of her symptoms.  They are to keep track of her blood pressure and heart rate at home.  A BMET will be ordered prior to her returning  3.  Hypothyroidism: She remains on levothyroxine which is being followed by PCP.  4.  Depression: Remains on Paxil per primary care.  5.  Hypercholesterolemia: Remains on atorvastatin 40 mg daily.  Most recent labs on September 2021 revealed good control.  Primary care is managing labs.  I am concerned about overmedicating this frail 87 year old lady and would like to be conservative in her management and allow for permissive hypertension.  However with fatigue metoprolol was decreased but can also be titrated back up to 25 mg in the morning and 12.5 mg at nighttime if heart rate is not well controlled causing issues with her breathing.  This is a precarious situation with multiple issues in this very frail woman.   Current medicines are reviewed at length with the patient today.  I have spent 40 minutes  dedicated to the care of this patient on the date of this encounter to include pre-visit review of  records, assessment, management and diagnostic testing,with shared decision making.  Labs/ tests ordered today include: BMET Phill Myron. [place] Pugh, ANP, AACC   07/05/2021 5:50 PM    [address] Office [phone] Fax [phone]  Notice: This dictation was prepared with Dragon dictation along with smaller phrase technology. Any transcriptional errors that result from this process are unintentional and may not be corrected upon review.

## 2021-07-05 ENCOUNTER — Encounter: Payer: Self-pay | Admitting: Adult Health

## 2021-07-05 ENCOUNTER — Other Ambulatory Visit: Payer: Self-pay

## 2021-07-05 ENCOUNTER — Ambulatory Visit (INDEPENDENT_AMBULATORY_CARE_PROVIDER_SITE_OTHER): Payer: Medicare Other | Admitting: Adult Health

## 2021-07-05 VITALS — BP 145/70 | HR 75 | Ht 61.2 in | Wt 116.8 lb

## 2021-07-05 DIAGNOSIS — E039 Hypothyroidism, unspecified: Secondary | ICD-10-CM | POA: Diagnosis not present

## 2021-07-05 DIAGNOSIS — E7800 Pure hypercholesterolemia, unspecified: Secondary | ICD-10-CM | POA: Diagnosis not present

## 2021-07-05 DIAGNOSIS — I5032 Chronic diastolic (congestive) heart failure: Secondary | ICD-10-CM | POA: Diagnosis not present

## 2021-07-05 DIAGNOSIS — R0602 Shortness of breath: Secondary | ICD-10-CM

## 2021-07-05 DIAGNOSIS — I10 Essential (primary) hypertension: Secondary | ICD-10-CM

## 2021-07-05 MED ORDER — AMLODIPINE BESYLATE 5 MG PO TABS: 5.0000 mg | ORAL_TABLET | Freq: Every day | ORAL | 1 refills | Status: DC

## 2021-07-05 MED ORDER — AMLODIPINE BESYLATE 5 MG PO TABS: 5.0000 mg | ORAL_TABLET | Freq: Every day | ORAL | 3 refills | Status: DC

## 2021-07-05 MED ORDER — METOPROLOL TARTRATE 25 MG PO TABS: 12.5000 mg | ORAL_TABLET | Freq: Two times a day (BID) | ORAL | 1 refills | Status: DC

## 2021-07-05 MED ORDER — FUROSEMIDE 20 MG PO TABS: 20.0000 mg | ORAL_TABLET | ORAL | 0 refills | Status: DC

## 2021-07-05 NOTE — Patient Instructions (Signed)
Medication Instructions:  Stop Irbesartan. Start Amlodipine 5 mg ( Take 1 Tablet Daily). Start lasix 20 mg ( 1 Tablet Every Other Day). Decrease Metoprolol 25 mg to 12.5 mg ( half Tablet Twice Daily). *If you need a refill on your cardiac medications before your next appointment, please call your pharmacy*   Lab Work: BMET: To Be Done July 22, 2021 If you have labs (blood work) drawn today and your tests are completely normal, you will receive your results only by: [facility] (if you have MyChart) OR A paper copy in the mail If you have any lab test that is abnormal or we need to change your treatment, we will call you to review the results.   Testing/Procedures: No Testing   Follow-Up: At [facility], you and your health needs are our priority.  As part of our continuing mission to provide you with exceptional heart care, we have created designated Provider Care Teams.  These Care Teams include your primary Cardiologist (physician) and Advanced Practice Providers (APPs -  Physician Assistants and Nurse Practitioners) who all work together to provide you with the care you need, when you need it.  We recommend signing up for the patient portal called "MyChart".  Sign up information is provided on this After Visit Summary.  MyChart is used to connect with patients for Virtual Visits (Telemedicine).  Patients are able to view lab/test results, encounter notes, upcoming appointments, etc.  Non-urgent messages can be sent to your provider as well.   To learn more about what you can do with MyChart, go to [email or web address].    Your next appointment:   3 week(s)  The format for your next appointment:   In Person  Provider:   Jory Sims, DNP, ANP

## 2021-07-09 ENCOUNTER — Other Ambulatory Visit: Payer: Self-pay | Admitting: Family Medicine

## 2021-07-09 DIAGNOSIS — R413 Other amnesia: Secondary | ICD-10-CM

## 2021-07-10 DIAGNOSIS — M810 Age-related osteoporosis without current pathological fracture: Secondary | ICD-10-CM | POA: Diagnosis not present

## 2021-07-10 DIAGNOSIS — I10 Essential (primary) hypertension: Secondary | ICD-10-CM | POA: Diagnosis not present

## 2021-07-10 DIAGNOSIS — M5136 Other intervertebral disc degeneration, lumbar region: Secondary | ICD-10-CM | POA: Diagnosis not present

## 2021-07-10 DIAGNOSIS — M533 Sacrococcygeal disorders, not elsewhere classified: Secondary | ICD-10-CM | POA: Diagnosis not present

## 2021-07-10 DIAGNOSIS — G8929 Other chronic pain: Secondary | ICD-10-CM | POA: Diagnosis not present

## 2021-07-10 DIAGNOSIS — M4180 Other forms of scoliosis, site unspecified: Secondary | ICD-10-CM | POA: Diagnosis not present

## 2021-07-22 DIAGNOSIS — I10 Essential (primary) hypertension: Secondary | ICD-10-CM | POA: Diagnosis not present

## 2021-07-23 LAB — BASIC METABOLIC PANEL
BUN/Creatinine Ratio: 25 (ref 12–28)
BUN: 26 mg/dL (ref 10–36)
CO2: 23 mmol/L (ref 20–29)
Calcium: 8.9 mg/dL (ref 8.7–10.3)
Chloride: 100 mmol/L (ref 96–106)
Creatinine, Ser: 1.03 mg/dL — ABNORMAL HIGH (ref 0.57–1.00)
Glucose: 85 mg/dL (ref 70–99)
Potassium: 5.3 mmol/L — ABNORMAL HIGH (ref 3.5–5.2)
Sodium: 138 mmol/L (ref 134–144)
eGFR: 50 mL/min/{1.73_m2} — ABNORMAL LOW (ref >59)

## 2021-07-25 NOTE — Progress Notes (Signed)
Cardiology Office Note   Date:  07/26/2021   ID:  Erin Ryan, DOB [birth year], MRN [id number]  PCP:  Harlan Stains, MD  Cardiologist: Dr. Percival Spanish CC: Follow up medication changes    History of Present Illness: Erin Ryan is a 87 y.o. female who presents for ongoing assessment and management of hypertension, tachycardia, and chronic shortness of breath.  She comes with her daughter who is very attentive to her symptoms and needs.  On last office visit dated 07/05/2021 it was noted that she continued to have complaints of shortness of breath just walking from her bedroom into the kitchen along with a tickle cough which has been aggravating her for several months.  She also was having issues with confusion which was thought to be related to UTI, but there were also concerns for dementia.  She was treated for UTI by PCP.  Due to tickle cough which she stated was very bothersome to her, she was taken off of irbesartan and started on amlodipine 5 mg daily instead.  She was also advised to take Lasix 20 mg every other day due to lower extremity edema, which could also be exacerbated by introduction of amlodipine to her medication regimen.  She was made aware that amlodipine could cause lower extremity edema on that visit.  Due to bradycardia I decreased her metoprolol from 25 mg twice daily to 12.5 mg twice daily due to her fatigue.  She is here on follow-up to evaluate her response to medications.  There was concerned about overmedicating her and allowing for permissive hypertension due to her profound fatigue.  Consideration for increasing metoprolol to 25 mg in the morning and 12.5 mg at night if her heart rate increase during the day causing symptoms of shortness of breath or palpitations.  She has had some improvement since being seen last according to her daughter.  Her tickle cough is gone.  She denies any changes in her energy level for the better even though we did make some changes in her  metoprolol dose.  She remains fairly and active.  Her memory issues continue to be on the forefront of her daughters mind concerning a decline in her mentation.  She is continue to have work-up by PCP for dementia and does have a follow-up MRI scheduled.  She denies dizziness, palpitations, or discomfort.  She is able to tell me about a brand-new grandson, Erin Ryan, and asked her daughter to show me pictures of the baby.  She talks about his birth, how small he was when he was born, and that she has seen him recently, which her daughter does confirm.  So some memory issues are intact while others are not.  Her daughter states that she sometimes forgets to take her medicines and so she does follow-up closely to make sure that she does.  Past Medical History:  Diagnosis Date   Arthritis    GERD (gastroesophageal reflux disease)    High cholesterol    Hypertension    Osteoporosis    Thyroid disease     Past Surgical History:  Procedure Laterality Date   CATARACT EXTRACTION     cysts removed from bilateral breasts     THYROIDECTOMY       Current Outpatient Medications  Medication Sig Dispense Refill   amLODipine (NORVASC) 5 MG tablet Take 1 tablet (5 mg total) by mouth daily. 90 tablet 1   aspirin 81 MG tablet Take 81 mg by mouth daily.     atorvastatin (LIPITOR) 40  MG tablet Take 40 mg by mouth daily.     Calcium Citrate-Vitamin D (CALCIUM CITRATE + PO) Take 1 tablet 2 (two) times daily by mouth.     denosumab (PROLIA) 60 MG/ML SOSY injection See admin instructions.     levothyroxine (SYNTHROID) 125 MCG tablet daw-1 1 tablet on an empty stomach in the morning     metoprolol tartrate (LOPRESSOR) 25 MG tablet Take 0.5 tablets (12.5 mg total) by mouth 2 (two) times daily. 180 tablet 1   omeprazole (PRILOSEC) 40 MG capsule Take 1 capsule (40 mg total) by mouth 2 (two) times daily before a meal. 180 capsule 3   PARoxetine (PAXIL) 10 MG tablet Take 10 mg by mouth every morning.     furosemide  (LASIX) 20 MG tablet Take 1 tablet (20 mg total) by mouth every other day. 90 tablet 2   No current facility-administered medications for this visit.    Allergies:   Hydrochlorothiazide and Short ragweed pollen ext    Social History:  The patient  reports that she has never smoked. She has never used smokeless tobacco. She reports that she does not drink alcohol and does not use drugs.   Family History:  The patient's family history includes Asthma in her father; Bladder Cancer in her mother; Heart attack in her brother; Heart disease in her father; Heart disease (age of onset: 70) in her mother.    ROS: All other systems are reviewed and negative. Unless otherwise mentioned in H&P    PHYSICAL EXAM: VS:  BP 134/64   Pulse 70   Ht 5' (1.524 m)   Wt 118 lb (53.5 kg)   SpO2 99%   BMI 23.05 kg/m  , BMI Body mass index is 23.05 kg/m. GEN: Well nourished, well developed, in no acute distress, frail HEENT: normal Neck: no JVD, carotid bruits, or masses Cardiac: IRRR; no murmurs, rubs, or gallops,no edema  Respiratory:  Clear to auscultation bilaterally, normal work of breathing GI: soft, nontender, nondistended, + BS MS: no deformity or atrophy, generalized deconditioning is noted. Skin: warm and dry, no rash Neuro:  Strength and sensation are intact.  Some issues with memory concerning the past but has some short-term memory which is intact. Psych: euthymic mood, full affect   EKG:  EKG is not ordered today.    Recent Labs: 01/31/2021: NT-Pro BNP 1,216 07/22/2021: BUN 26; Creatinine, Ser 1.03; Potassium 5.3; Sodium 138    Lipid Panel No results found for: CHOL, TRIG, HDL, CHOLHDL, VLDL, LDLCALC, LDLDIRECT    Wt Readings from Last 3 Encounters:  07/26/21 118 lb (53.5 kg)  07/05/21 116 lb 12.8 oz (53 kg)  03/04/21 119 lb 3.2 oz (54.1 kg)      Other studies Reviewed: Echocardiogram 04/01/2019 1. Left ventricular ejection fraction, by estimation, is 55 to 60%. Left   ventricular ejection fraction by 3D volume is 56 %. The left ventricle has  normal function. The left ventricle has no regional wall motion  abnormalities. Left ventricular diastolic   parameters are consistent with Grade I diastolic dysfunction (impaired  relaxation). Elevated left atrial pressure. The E/e' is 52.   2. Right ventricular systolic function is normal. The right ventricular  size is normal. There is normal pulmonary artery systolic pressure. The  estimated right ventricular systolic pressure is 28.4 mmHg.   3. The mitral valve is grossly normal. Trivial mitral valve  regurgitation. Moderate mitral annular calcification.   4. The aortic valve is tricuspid. There is mild thickening  of the aortic  valve. Aortic valve regurgitation is trivial. Mild aortic valve sclerosis  is present, with no evidence of aortic valve stenosis.   5. The inferior vena cava is normal in size with greater than 50%  respiratory variability, suggesting right atrial pressure of 3 mmHg.    ASSESSMENT AND PLAN:  1.  Hypertension: Well-controlled off of irbesartan and newly placed on amlodipine 5 mg daily.  Her cough is eliminated and she feels better about this.  I do not see any significant lower extremity edema, and she denies any dizziness with position change.  Continue this current medication regimen  2. Hyperlipidemia: Continue statin therapy.   3.  Hypothyroidism: She remains on levothyroxine which is being followed by PCP.  4.  Memory loss: Her daughter is still concerned about her memory loss and mild confusion.  She is being worked up by PCP and does have another MRI planned.  Of note, her sister who is in a skilled nursing facility has severe dementia.  The patient goes to see her every couple of weeks and does remember doing so.  She also talks at length about having new great grandson born in June.  But she does have trouble remembering past events where she has placed things, and sometimes  forgets to take her medicine.  Deferring to PCP for work-up.  5. Frailty: Uses a cane for ambulation and has been recently given a rolling walker with a seat which she is using now for better stability.  She and her daughter deny any falls.   Current medicines are reviewed at length with the patient today.  I have spent 25 minutes dedicated to the care of this patient on the date of this encounter to include pre-visit review of records, assessment, management and diagnostic testing,with shared decision making.  Labs/ tests ordered today include: None  Phill Myron. West Pugh, ANP, AACC   07/26/2021 11:22 AM    [place] [address] Office [phone] Fax [phone]  Notice: This dictation was prepared with Dragon dictation along with smaller phrase technology. Any transcriptional errors that result from this process are unintentional and may not be corrected upon review.

## 2021-07-26 ENCOUNTER — Ambulatory Visit (INDEPENDENT_AMBULATORY_CARE_PROVIDER_SITE_OTHER): Payer: Medicare Other | Admitting: Adult Health

## 2021-07-26 ENCOUNTER — Encounter: Payer: Self-pay | Admitting: Adult Health

## 2021-07-26 ENCOUNTER — Other Ambulatory Visit: Payer: Self-pay

## 2021-07-26 VITALS — BP 134/64 | HR 70 | Ht 60.0 in | Wt 118.0 lb

## 2021-07-26 DIAGNOSIS — I10 Essential (primary) hypertension: Secondary | ICD-10-CM

## 2021-07-26 DIAGNOSIS — R413 Other amnesia: Secondary | ICD-10-CM

## 2021-07-26 DIAGNOSIS — I5032 Chronic diastolic (congestive) heart failure: Secondary | ICD-10-CM | POA: Diagnosis not present

## 2021-07-26 DIAGNOSIS — E7800 Pure hypercholesterolemia, unspecified: Secondary | ICD-10-CM | POA: Diagnosis not present

## 2021-07-26 DIAGNOSIS — R0609 Other forms of dyspnea: Secondary | ICD-10-CM | POA: Diagnosis not present

## 2021-07-26 MED ORDER — FUROSEMIDE 20 MG PO TABS: 20.0000 mg | ORAL_TABLET | ORAL | 2 refills | Status: DC

## 2021-07-26 NOTE — Patient Instructions (Signed)
Medication Instructions:  No Changes *If you need a refill on your cardiac medications before your next appointment, please call your pharmacy*   Lab Work: None Ordered   Testing/Procedures: None Ordered   Follow-Up: At [facility], you and your health needs are our priority.  As part of our continuing mission to provide you with exceptional heart care, we have created designated Provider Care Teams.  These Care Teams include your primary Cardiologist (physician) and Advanced Practice Providers (APPs -  Physician Assistants and Nurse Practitioners) who all work together to provide you with the care you need, when you need it.  We recommend signing up for the patient portal called "MyChart".  Sign up information is provided on this After Visit Summary.  MyChart is used to connect with patients for Virtual Visits (Telemedicine).  Patients are able to view lab/test results, encounter notes, upcoming appointments, etc.  Non-urgent messages can be sent to your provider as well.   To learn more about what you can do with MyChart, go to [email or web address].    Your next appointment:   6 month(s)  The format for your next appointment:   In Person  Provider:   Dr. Percival Spanish

## 2021-08-06 ENCOUNTER — Other Ambulatory Visit: Payer: Medicare Other

## 2021-08-06 DIAGNOSIS — B029 Zoster without complications: Secondary | ICD-10-CM | POA: Diagnosis not present

## 2021-08-12 DIAGNOSIS — R350 Frequency of micturition: Secondary | ICD-10-CM | POA: Diagnosis not present

## 2021-08-12 DIAGNOSIS — F411 Generalized anxiety disorder: Secondary | ICD-10-CM | POA: Diagnosis not present

## 2021-08-12 DIAGNOSIS — H9193 Unspecified hearing loss, bilateral: Secondary | ICD-10-CM | POA: Diagnosis not present

## 2021-08-12 DIAGNOSIS — R630 Anorexia: Secondary | ICD-10-CM | POA: Diagnosis not present

## 2021-08-12 DIAGNOSIS — B029 Zoster without complications: Secondary | ICD-10-CM | POA: Diagnosis not present

## 2021-08-12 DIAGNOSIS — R413 Other amnesia: Secondary | ICD-10-CM | POA: Diagnosis not present

## 2021-08-12 DIAGNOSIS — R4181 Age-related cognitive decline: Secondary | ICD-10-CM | POA: Diagnosis not present

## 2021-08-23 ENCOUNTER — Ambulatory Visit: Payer: Medicare Other | Admitting: Cardiology

## 2021-08-25 ENCOUNTER — Other Ambulatory Visit: Payer: Medicare Other

## 2021-09-02 ENCOUNTER — Ambulatory Visit
Admission: RE | Admit: 2021-09-02 | Discharge: 2021-09-02 | Disposition: A | Discharge status: Home / Self Care | Payer: Medicare Other | Source: Ambulatory Visit | Attending: Family Medicine | Admitting: Family Medicine

## 2021-09-02 ENCOUNTER — Other Ambulatory Visit: Payer: Self-pay

## 2021-09-02 DIAGNOSIS — G319 Degenerative disease of nervous system, unspecified: Secondary | ICD-10-CM | POA: Diagnosis not present

## 2021-09-02 DIAGNOSIS — I6782 Cerebral ischemia: Secondary | ICD-10-CM | POA: Diagnosis not present

## 2021-09-02 DIAGNOSIS — R413 Other amnesia: Secondary | ICD-10-CM

## 2021-09-06 DIAGNOSIS — I129 Hypertensive chronic kidney disease with stage 1 through stage 4 chronic kidney disease, or unspecified chronic kidney disease: Secondary | ICD-10-CM | POA: Diagnosis not present

## 2021-09-06 DIAGNOSIS — F419 Anxiety disorder, unspecified: Secondary | ICD-10-CM | POA: Diagnosis not present

## 2021-09-06 DIAGNOSIS — E441 Mild protein-calorie malnutrition: Secondary | ICD-10-CM | POA: Diagnosis not present

## 2021-09-06 DIAGNOSIS — B0229 Other postherpetic nervous system involvement: Secondary | ICD-10-CM | POA: Diagnosis not present

## 2021-09-06 DIAGNOSIS — N183 Chronic kidney disease, stage 3 unspecified: Secondary | ICD-10-CM | POA: Diagnosis not present

## 2021-09-06 DIAGNOSIS — R413 Other amnesia: Secondary | ICD-10-CM | POA: Diagnosis not present

## 2021-09-19 DIAGNOSIS — R410 Disorientation, unspecified: Secondary | ICD-10-CM | POA: Diagnosis not present

## 2021-10-13 DIAGNOSIS — R000 Tachycardia, unspecified: Secondary | ICD-10-CM | POA: Insufficient documentation

## 2021-10-13 NOTE — Progress Notes (Signed)
Cardiology Office Note   Date:  10/14/2021   ID:  Erin Ryan, DOB 26-Jan-1925, MRN [id number]  PCP:  Harlan Stains, MD  Cardiologist:   None Referring:  Harlan Stains, MD  Chief Complaint  Patient presents with   Follow-up   Palpitations       History of Present Illness: Erin Ryan is a 88 y.o. female who presents for evaluation of SOB.   She said this has been slowly progressive.  She was evaluated in the past by pulmonary as she was told she had COPD but when she saw Dr. Melvyn Novas in 2018 he thought it was probably reflux and not COPD.   When I saw her for the first time recently I checked a BNP and this was markedly elevated at 1216.    Echo was unremarkable.    There was mild LVH she brings her oxygen saturations at her O2 sats are always in the 90s.  She does have tachycardia with heart rates typically in the 90s sometimes above 112.  There does seem to be correlation to heart rate being faster and the oxygen levels being lower although this. She had bradycardia and has only tolerated low dose beta blocker. She was taken off of ARB because of a cough.  She has had edema on Norvasc.   Since she was last seen she has actually done okay.  Looks like she has had her meds adjusted again by her primary provider but I do not see these records.  I do see the list and we believe it to be accurate.  Her blood pressures have been controlled.  Her heart rate has not been particularly elevated.  She is not having any presyncope or syncope.  She is having no new dizziness.  She is being evaluated and had an MRI for dementia.  Her daughter actually thinks this is better since he stopped Lyrica.  She was getting Lyrica apparently for shingles.   Past Medical History:  Diagnosis Date   Arthritis    GERD (gastroesophageal reflux disease)    High cholesterol    Hypertension    Osteoporosis    Thyroid disease     Past Surgical History:  Procedure Laterality Date   CATARACT EXTRACTION      cysts removed from bilateral breasts     THYROIDECTOMY       Current Outpatient Medications  Medication Sig Dispense Refill   amLODipine (NORVASC) 2.5 MG tablet Take 2.5 mg by mouth daily.     aspirin 81 MG tablet Take 81 mg by mouth daily.     atorvastatin (LIPITOR) 40 MG tablet Take 40 mg by mouth daily.     Calcium Citrate-Vitamin D (CALCIUM CITRATE + PO) Take 1 tablet 2 (two) times daily by mouth.     denosumab (PROLIA) 60 MG/ML SOSY injection See admin instructions.     DULoxetine (CYMBALTA) 30 MG capsule Take 30 mg by mouth daily.     furosemide (LASIX) 20 MG tablet Take 1 tablet (20 mg total) by mouth every other day. 90 tablet 2   levothyroxine (SYNTHROID) 125 MCG tablet daw-1 1 tablet on an empty stomach in the morning     metoprolol tartrate (LOPRESSOR) 25 MG tablet Take 0.5 tablets (12.5 mg total) by mouth 2 (two) times daily. 180 tablet 1   mirtazapine (REMERON) 15 MG tablet Take 15 mg by mouth at bedtime.     omeprazole (PRILOSEC) 40 MG capsule Take 1 capsule (40 mg total) by  mouth 2 (two) times daily before a meal. 180 capsule 3   No current facility-administered medications for this visit.    Allergies:   Hydrochlorothiazide and Short ragweed pollen ext    ROS:  Please see the history of present illness.   Otherwise, review of systems are positive for none.   All other systems are reviewed and negative.    PHYSICAL EXAM: VS:  BP 110/68    Pulse 80    Ht 5' (1.524 m)    Wt 108 lb 6.4 oz (49.2 kg)    SpO2 98%    BMI 21.17 kg/m  , BMI Body mass index is 21.17 kg/m. GENERAL:  Well appearing NECK:  No jugular venous distention, waveform within normal limits, carotid upstroke brisk and symmetric, no bruits, no thyromegaly LUNGS:  Clear to auscultation bilaterally CHEST:  Unremarkable HEART:  PMI not displaced or sustained,S1 and S2 within normal limits, no S3, no S4, no clicks, no rubs, no murmurs ABD:  Flat, positive bowel sounds normal in frequency in pitch, no bruits,  no rebound, no guarding, no midline pulsatile mass, no hepatomegaly, no splenomegaly EXT:  2 plus pulses throughout, no edema, no cyanosis no clubbing   EKG:  EKG is  not  ordered today.  Recent Labs: 01/31/2021: NT-Pro BNP 1,216 07/22/2021: BUN 26; Creatinine, Ser 1.03; Potassium 5.3; Sodium 138    Lipid Panel No results found for: CHOL, TRIG, HDL, CHOLHDL, VLDL, LDLCALC, LDLDIRECT    Wt Readings from Last 3 Encounters:  10/14/21 108 lb 6.4 oz (49.2 kg)  07/26/21 118 lb (53.5 kg)  07/05/21 116 lb 12.8 oz (53 kg)      Other studies Reviewed: Additional studies/ records that were reviewed today include: MRI Review of the above records demonstrates:  Please see elsewhere in the note.     ASSESSMENT AND PLAN:  SOB:     This is improved.  No change in therapy.  No further work-up.  HTN:   Her blood pressure is well controlled.  She will continue the meds as listed.  TACHYCARDIA:   Her daughter is not noticing any heart rates that are elevated.  She seems to be on a stable regimen.  No change in therapy.   Current medicines are reviewed at length with the patient today.  The patient does not have concerns regarding medicines.  The following changes have been made:  None  Labs/ tests ordered today include: None  No orders of the defined types were placed in this encounter.     Disposition:   FU with me as needed.     Signed, Minus Breeding, MD  10/14/2021 10:59 AM    [facility]

## 2021-10-14 ENCOUNTER — Other Ambulatory Visit: Payer: Self-pay

## 2021-10-14 ENCOUNTER — Encounter: Payer: Self-pay | Admitting: Cardiology

## 2021-10-14 ENCOUNTER — Ambulatory Visit (INDEPENDENT_AMBULATORY_CARE_PROVIDER_SITE_OTHER): Payer: Medicare Other | Admitting: Cardiology

## 2021-10-14 VITALS — BP 110/68 | HR 80 | Ht 60.0 in | Wt 108.4 lb

## 2021-10-14 DIAGNOSIS — R0602 Shortness of breath: Secondary | ICD-10-CM | POA: Diagnosis not present

## 2021-10-14 DIAGNOSIS — R000 Tachycardia, unspecified: Secondary | ICD-10-CM

## 2021-10-14 DIAGNOSIS — I10 Essential (primary) hypertension: Secondary | ICD-10-CM

## 2021-10-14 NOTE — Patient Instructions (Signed)
Medication Instructions:  ?Your physician recommends that you continue on your current medications as directed. Please refer to the Current Medication list given to you today.  ? ?Labwork: ?NONE ? ?Testing/Procedures: ?NONE ? ?Follow-Up: ?AS NEEDED  ? ?  ?

## 2021-10-16 DIAGNOSIS — R413 Other amnesia: Secondary | ICD-10-CM | POA: Diagnosis not present

## 2021-10-16 DIAGNOSIS — I129 Hypertensive chronic kidney disease with stage 1 through stage 4 chronic kidney disease, or unspecified chronic kidney disease: Secondary | ICD-10-CM | POA: Diagnosis not present

## 2021-10-16 DIAGNOSIS — R54 Age-related physical debility: Secondary | ICD-10-CM | POA: Diagnosis not present

## 2021-10-16 DIAGNOSIS — N183 Chronic kidney disease, stage 3 unspecified: Secondary | ICD-10-CM | POA: Diagnosis not present

## 2021-10-16 DIAGNOSIS — F411 Generalized anxiety disorder: Secondary | ICD-10-CM | POA: Diagnosis not present

## 2021-10-16 DIAGNOSIS — E039 Hypothyroidism, unspecified: Secondary | ICD-10-CM | POA: Diagnosis not present

## 2021-10-16 DIAGNOSIS — E785 Hyperlipidemia, unspecified: Secondary | ICD-10-CM | POA: Diagnosis not present

## 2021-10-16 DIAGNOSIS — M810 Age-related osteoporosis without current pathological fracture: Secondary | ICD-10-CM | POA: Diagnosis not present

## 2021-11-05 ENCOUNTER — Encounter: Payer: Self-pay | Admitting: Neurology

## 2021-11-05 ENCOUNTER — Ambulatory Visit (INDEPENDENT_AMBULATORY_CARE_PROVIDER_SITE_OTHER): Payer: Medicare Other | Admitting: Neurology

## 2021-11-05 VITALS — BP 132/66 | HR 79 | Ht 61.0 in | Wt 110.4 lb

## 2021-11-05 DIAGNOSIS — F03B0 Unspecified dementia, moderate, without behavioral disturbance, psychotic disturbance, mood disturbance, and anxiety: Secondary | ICD-10-CM | POA: Diagnosis not present

## 2021-11-05 MED ORDER — DONEPEZIL HCL 5 MG PO TABS: 5.0000 mg | ORAL_TABLET | Freq: Every day | ORAL | 1 refills | Status: DC

## 2021-11-05 NOTE — Patient Instructions (Addendum)
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 Any type of exercise (including walking) is acceptable although a recumbent bicycle may be best if you are unsteady. Disease related apathy can be a significant roadblock to exercise and the only way to overcome this is to make it a daily routine and perhaps have a reward at the end (something your loved one loves to eat or drink perhaps) or a personal trainer coming to the home can also be very useful. In general a structured, repetitive schedule is best.   Cardiovascular Health: You should optimize all cardiovascular risk factors (blood pressure, sugar, cholesterol) as vascular disease such as strokes and heart attacks can make memory problems much worse.   Diet: Eating a heart healthy (Mediterranean) diet is also a good idea; fish and poultry instead of red meat, nuts (mostly non-peanuts), vegetables, fruits, olive oil or canola oil (instead of butter), minimal salt (use other spices to flavor foods), whole grain rice, bread, cereal and pasta and wine in moderation.  General Health: Any diseases which effect your body will effect your brain such as a pneumonia, urinary infection, blood clot, heart attack or stroke. Keep contact with your primary care doctor for regular follow ups.  Sleep. A good nights sleep is healthy for the brain. Seven hours is recommended. If you have insomnia or poor sleep habits see the recommendations below  Tips: Structured and consistent daytime and nighttime routine, including regular wake times, bedtimes, and mealtimes, will be important for the patient to avoid confusion.  Keeping frequently used items in designated places will help reduce stress from searching. If there are worries about getting lost do not let the patient leave home unaccompanied. They might benefit from wearing an identification bracelet that will help others assist in finding home if they become lost. Information about nationwide safe return services and other helpful resources may be obtained through the [place] helpline at [phone].  Finances, Power of Producer, television/film/video Directives: You should consider putting legal safeguards in place with regard to financial and medical decision making. While the spouse always has power of attorney for medical and financial issues in the absence of any form, you should consider what you want in case the spouse / caregiver is no longer around or capable of making decisions.   [place] : http://www.welch.[email or web address]  Or Google "[place]" AND "An Forensic scientist for [employer]  Other States: ApartmentMom.com.ee  The signature on these forms should be notarized.   DRIVING:   Driving only during the day Drive only to familiar Locations Avoid driving during bad weather  If you would like to be tested to see if you are driving safely, [facility] has a Clinical Driving Evaluation. To schedule an appointment call [phone].                RESOURCES:  Memory Loss: Improve your short term memory By Silvio Pate  The Alzheimer's Reading Room http://[email or web address]   The Alzheimer's Compendium http://www.alzcompend.info/  [employer] www.dukefamilysupport.VEH 423-120-4791  Recommended resources for caregivers (All can be purchased on [place]):  1) A Caregiver's Guide to Dementia: Using Activities and Other Strategies to  Prevent, Reduce and Manage Behavioral  Symptoms by Osie Bond. Tyler Aas and [employer]   2) A Caregiver's Guide to [employer] Dementia by Caleen Essex MS BSN and Gaston Islam   3) What If It's Not Alzheimer's?: A Caregiver's Guide to Dementia by Koren Shiver (Author), Octaviano Batty (Editor)  3) The 36 hour day by Rabins and Mace  4) Understanding Difficult Behaviors by Merita Norton and White  Online course for helping caregivers reduce stress, guilt and frustration called the Caregivers Helpbook. The website is www.powerfultoolsforcaregivers.org  As a caregiver you are a Art gallery manager. Problems you face as a caregiver are usually unique to your situation and the way your loved-one's disease manifests itself. The best way to use these books is to look at the Table of Contents and read any chapters of interest or that apply to challenges you are having as a caregiver.  NATIONAL RESOURCES: For more information on neurological disorders or research programs funded by the [employer] of Neurological Disorders and Stroke, contact the [place] (BRAIN) at: [place] [address] [phone] (toll-free) MasterBoxes.it  Information on dementia is also available from the following organizations: Alzheimers Disease Education and Referral ([place]) [place] on Aging [address] [place] [address] [phone] (toll-free) DVDEnthusiasts.nl  Alzheimers Association [address] [place] [address] [phone] (toll-free, 24-hour helpline) [phone] (TDD) CapitalMile.co.nz  Alzheimers Foundation of America [address], [place] [address] [phone] (toll-free) www.alzfdn.[place] [address] [place] [address] [phone] www.alzdiscovery.org  Association for [address], [place] [address] [phone] (toll-free) [place] [address] [phone] (toll-free) www.brightfocus.org/alzheimers  Doran Stabler [facility] [address] [place] [address] [phone] [email or web address]  Lewy Body Dementia Association [address], [place] [address] [phone] (toll-free LBD Caregiver Link) www.lbda.[place], [place], [place] [phone] (toll-free) [phone] [facility]) https://carter.com/  National Organization for Rare Disorders [address] [place] [address] [phone]) (toll-free) www.rarediseases.org  The Dementias: Hope Through Research was jointly produced by the [employer] of Neurological Disorders and Stroke (NINDS) and the [employer] on Aging (NIA), both part of the [place] agency--supporting scientific studies that turn discovery into health. NINDS is the nations leading funder of research on the brain and nervous system. The NINDS mission is to reduce the burden of neurological disease. For more information and resources, visit MasterBoxes.it [1] or call 3203430773. NIA leads the federal government effort conducting and supporting research on aging and the health and well-being of older people. NIAs Alzheimers Disease Education and Referral (ADEAR) Center offers information and publications on dementia and caregiving for families, caregivers, and professionals. For more information, visit DVDEnthusiasts.nl [2] or call [phone]. Also available from NIA are publications and information about Alzheimers disease as well as the booklets  Frontotemporal Disorders: Information for Patients, Families, and Caregivers and Lewy Body Dementia: Information for Patients, Families, and Professionals. Source URL: SocialSpecialists.co.nz     Donepezil Disintegrating Tablets What is this medication? DONEPEZIL (doe NEP e zil) treats memory loss and confusion (dementia) in people who have Alzheimer disease. It works by improving attention, memory, and the ability to engage in daily activities.  It is not a cure for dementia or Alzheimer disease. This medicine may be used for other purposes; ask your health care provider or pharmacist if you have questions. COMMON BRAND NAME(S): Aricept What should I tell my care team before I take this medication? They need to know if you have any of these conditions: Asthma or other lung disease Difficulty passing urine Head injury Heart disease History of irregular heartbeat Liver disease Seizures (convulsions) Stomach or intestinal disease, ulcers or stomach bleeding An unusual or allergic reaction to donepezil, other medications, foods, dyes, or preservatives Pregnant or trying to get pregnant Breast-feeding How should I use this medication? Take this medication by mouth. Follow the directions on the prescription label. Place the tablet in the mouth and allow it to dissolve, then swallow. While you may take these tablets with water, it is not necessary to do so. You may take this medication with or without food. Take your doses at regular intervals. This medication is usually taken before bedtime. Do not take your medication more often than directed. Continue to take your medication even if you feel better. Do not stop taking except on the advice of your care team. Talk to your care team about the use of this medication in children. Special care may be needed. Overdosage: If you think you have taken too much of this medicine contact a poison control center or  emergency room at once. NOTE: This medicine is only for you. Do not share this medicine with others. What if I miss a dose? If you miss a dose, take it as soon as you can. If it is almost time for your next dose, take only that dose. Do not take double or extra doses. What may interact with this medication? Do not take this medication with any of the following: Certain medications for fungal infections like itraconazole, fluconazole, posaconazole, and voriconazole Cisapride Dextromethorphan; quinidine Dronedarone Pimozide Quinidine Thioridazine This medication may also interact with the following: Antihistamines for allergy, cough and cold Atropine Bethanechol Carbamazepine Certain medications for bladder problems like oxybutynin, tolterodine Certain medications for Parkinson's disease like benztropine, trihexyphenidyl Certain medications for stomach problems like dicyclomine, hyoscyamine Certain medications for travel sickness like scopolamine Dexamethasone Dofetilide Ipratropium NSAIDs, medications for pain and inflammation, like ibuprofen or naproxen Other medications for Alzheimer's disease Other medications that prolong the QT interval (cause an abnormal heart rhythm) Phenobarbital Phenytoin Rifampin, rifabutin or rifapentine Ziprasidone This list may not describe all possible interactions. Give your health care provider a list of all the medicines, herbs, non-prescription drugs, or dietary supplements you use. Also tell them if you smoke, drink alcohol, or use illegal drugs. Some items may interact with your medicine. What should I watch for while using this medication? Visit your care team for regular checks on your progress. Check with your care team if your symptoms do not get better or if they get worse. You may get drowsy or dizzy. Do not drive, use machinery, or do anything that needs mental alertness until you know how this medication affects you. What side effects  may I notice from receiving this medication? Side effects that you should report to your care team as soon as possible: Allergic reactions--skin rash, itching, hives, swelling of the face, lips, tongue, or throat Peptic ulcer--burning stomach pain, loss of appetite, bloating, burping, heartburn, nausea, vomiting Seizures Slow heartbeat--dizziness, feeling faint or lightheaded, confusion, trouble breathing, unusual weakness or fatigue Stomach bleeding--bloody or black, tar-like stools, vomiting blood or brown material that looks  like coffee grounds Trouble passing urine Side effects that usually do not require medical attention (report these to your care team if they continue or are bothersome): Diarrhea Fatigue Loss of appetite Muscle pain or cramps Nausea Trouble sleeping This list may not describe all possible side effects. Call your doctor for medical advice about side effects. You may report side effects to FDA at 1-800-FDA-1088. Where should I keep my medication? Keep out of reach of children. Store at room temperature between 15 and 30 degrees C (59 and 86 degrees F). Throw away any unused medication after the expiration date. NOTE: This sheet is a summary. It may not cover all possible information. If you have questions about this medicine, talk to your doctor, pharmacist, or health care provider.  2022 Elsevier/Gold Standard (2021-04-24 00:00:00)  Alzheimer's Disease Caregiver Guide Alzheimer's disease is a condition that makes a person: Forget things. Act differently. Have trouble paying attention and doing simple tasks. These things get worse with time. The tips below can help you care for the person. How to help manage lifestyle changes Tips to help with symptoms Be calm and patient. Give simple, short answers to questions. Avoid correcting the person in a negative way. Try not to take things personally, even if the person forgets your name. Do not argue with the person.  This may make the person more upset. Tips to lessen frustration Make appointments and do daily tasks when the person is at his or her best. Take your time. Simple tasks may take longer. Allow plenty of time to complete tasks. Limit choices for the person. Involve the person in what you are doing. Keep things organized: Keep a daily routine. Organize medicines in a pillbox for each day of the week. Keep a calendar in a central location to remind the person of meetings or other activities. Avoid new or crowded places, if possible. Use simple words, short sentences, and a calm voice. Only give one direction at a time. Buy clothes and shoes that are easy to put on and take off. Try to change the subject if the person becomes frustrated or angry. Tips to prevent injury  Keep floors clear. Remove rugs, magazine racks, and floor lamps. Keep hallways well-lit. Put a handrail and non-slip mat in the bathtub or shower. Put childproof locks on cabinets that have dangerous items in them. These items include medicine, alcohol, guns, toxic cleaning items, sharp tools, matches, and lighters. Put locks on doors where the person cannot see or reach them. This helps keep the person from going out of the house and getting lost. Be ready for emergencies. Keep a list of emergency phone numbers and addresses close by. Remove car keys and lock garage doors so that the person does not try to drive. Bracelets may be worn that track location and identify the person as having memory problems. This should be worn at all times for safety. Tips for the future  Discuss financial and legal planning early. People with this disease have trouble managing their money as the disease gets worse. Get help from a professional. Talk about advance directives, safety, and daily care. Take these steps: Create a living will and choose a power of attorney. This is someone who can make decisions for the person with Alzheimer's disease  when he or she can no longer do so. Discuss driving safety and when to stop driving. The person's doctor can help with this. If the person lives alone, make sure he or she is safe.  Some people need extra help at home. Other people need more care at a nursing home or care center. How to recognize changes in the person's condition With this disease, memory problems and confusion slowly get worse. In time, the person may not know his or her friends and family members. The disease can also cause changes in behavior and mood, such as anxiety or anger. The person may see, hear, taste, smell, or feel things that are not real (hallucinate). These changes can come on all of a sudden. They may happen in response to something such as: Pain. An infection. Changes in temperature or noise. Too much stimulation. Feeling lost or scared. Medicines. Where to find support Find out about services that can provide short-term care (respite care). These can allow you to take a break when you need it. Join a support group near you. These groups can help you: Learn ways to manage stress. Share experiences with others. Get emotional comfort and support. Learn about caregiving as the disease gets worse. Know what community resources are available. Where to find more information Alzheimer's Association: CapitalMile.co.nz Contact a doctor if: The person has a fever. The person has a sudden behavior change that does not get better with calming strategies. The person is not able to take care of himself or herself at home. You are no longer able to care for the person. Get help right away if: The person has a sudden increase in confusion or new hallucinations. The person threatens you or anyone else, including himself or herself. Get help right away if you feel like your loved one may hurt himself or herself or others, or has thoughts about taking his or her own life. Go to your nearest emergency room or: Call your local  emergency services (911 in the [place] Call the [place] at (416)113-1586 or 988 in the [place] This is open 24 hours a day. Text the Crisis Text Line at 516-037-7567. Summary Alzheimer's disease causes a person to forget things. A person who has this condition may have trouble doing simple tasks. Take steps to keep the person from getting hurt. Plan for future care. You can find support by joining a support group near you. This information is not intended to replace advice given to you by your health care provider. Make sure you discuss any questions you have with your health care provider. Document Revised: 04/03/2021 Document Reviewed: 12/26/2019 Elsevier Patient Education  2022 [place]. Vascular Dementia Dementia is a condition in which a person has problems with thinking, memory, and behavior that are severe enough to interfere with daily life. Vascular dementia is a type of dementia. It results from brain damage that is caused by the brain not getting enough blood. This condition may also be called vascular cognitive impairment. What are the causes? Vascular dementia is caused by conditions that reduce blood flow to the brain. Common causes of this condition include: Multiple small strokes. These may happen without symptoms (silent stroke). Major stroke. Damage to small blood vessels in the brain (cerebral small vessel disease). What increases the risk? The following factors may make you more likely to develop this condition: Having had a stroke. Having high blood pressure (hypertension) or high cholesterol. Having a disease that affects the heart or blood vessels. Smoking. Not being active. Being over age 28. Having any of these conditions: Diabetes. Metabolic syndrome. Obesity. Depression. A genetic condition that leads to stroke, such as CADASIL (cerebral autosomal dominant arteriopathy with subcortical infarcts  and leukoencephalopathy). What are the  signs or symptoms? Symptoms of vascular dementia can vary from one person to another. Symptoms may be mild or severe depending on the amount of damage and which parts of the brain have been affected. Symptoms may begin suddenly or may develop slowly. Mental symptoms may include: Confusion and memory problems. Poor attention and concentration. Trouble understanding speech. Depression. Personality changes. Trouble recognizing familiar people. Agitation or aggression. Paranoia or false beliefs (delusions). Hallucinations. These involve seeing, hearing, tasting, smelling, or feeling things that are not real. Physical symptoms may include: Weakness. Poor balance. Loss of bladder or bowel control (incontinence). Unsteady walking (gait). Speaking problems. Behavioral symptoms may include: Getting lost in familiar places. Problems with planning and judgment. Trouble following instructions. Social problems. Emotional outbursts. Trouble with daily activities and self-care. Problems handling money. Symptoms may remain stable, or they may get worse over time. Symptoms of vascular dementia may be similar to those of Alzheimer's disease. The two conditions can occur together (mixed dementia). How is this diagnosed? This condition may be diagnosed based on: Your medical history and a physical exam. Symptoms or changes that are reported by friends and family. Lab tests or other tests that check brain and nervous system function. Tests may include: Blood tests. Brain imaging tests. Tests of movement, speech, and other daily activities (neurological exam). Tests of memory, thinking, and problem-solving (neuropsychological or neurocognitive testing). There is not a specific test to diagnose vascular dementia. Diagnosis may involve several specialists. These may include: A health care provider who specializes in the brain and nervous system (neurologist). A health care provider who specializes in  understanding how problems in the brain can alter behavior and cognitive function (neuropsychologist). How is this treated? There is no cure for vascular dementia. Brain damage that has already occurred cannot be reversed. Treatment depends on: How severe the condition is. Which parts of your brain have been affected. Your overall health. Treatment measures aim to: Treat the underlying cause of vascular dementia and manage risk factors. This may include: Controlling blood pressure or lowering cholesterol. Treating diabetes. Making lifestyle changes, such as quitting smoking or losing weight. Manage symptoms. Prevent further brain damage. Improve the person's health and quality of life. Treatment for dementia may involve a team of health care providers, including: A neurologist. A provider who specializes in disorders of the mind (psychiatrist). A provider who specializes in helping people learn daily living skills (occupational therapist). A provider who focuses on speech and language changes (Electrical engineer). A heart specialist (cardiologist). A provider who helps people learn how to manage physical changes, such as movement and walking (exercise physiologist or physical therapist). Follow these instructions at home: Medicines Take over-the-counter and prescription medicines only as told by your health care provider. Use a pill organizer or pill reminder to help you manage your medicines. Lifestyle Do not use any products that contain nicotine or tobacco, such as cigarettes, e-cigarettes, and chewing tobacco. If you need help quitting, ask your health care provider. Eat a healthy, balanced diet. Maintain a healthy weight, or lose weight if needed. Be physically active as told by your health care provider. General instructions Work with your health care provider to determine what you need help with and what your safety needs are. Follow the health care provider's instructions for  treating the condition that caused the dementia. Keep all follow-up visits. This is important. If you are the caregiver: People with vascular dementia may need regular help at home or daily care  from a family member or home health care worker. Home care for a person with vascular dementia depends on what caused the condition and how severe the symptoms are. General guidelines for caregivers include: Help the person with dementia remember people, appointments, and daily activities. Help the person with dementia manage his or her medicines. Help family and friends learn about ways to communicate with the person with dementia. Create a safe living space to reduce the risk of injury or falls. Find a support group to help caregivers and family cope with the effects of dementia.  Where to find more information [employer] of Neurological Disorders and Stroke: MasterBoxes.it Contact a health care provider if: New behavioral problems develop. Problems with swallowing develop. Confusion gets worse. Sleepiness gets worse. Get help right away if: Loss of consciousness occurs. There is a sudden loss of speech, balance, or thinking ability. New numbness or paralysis occurs. Sudden, severe headache occurs. Vision is lost or suddenly gets worse in one or both eyes. If you ever feel like the person with dementia may hurt himself or herself or others, or if he or she shares thoughts about taking his or her own life, get help right away. You can go to your nearest emergency department or: Call your local emergency services (911 in the [place] Call a suicide crisis helpline, such as the [place] at (914)526-4086 or 988 in the [place]. This is open 24 hours a day in the [place] Text the Crisis Text Line at 667 087 6574 (in the [place].). Summary Vascular dementia is a type of dementia. It results from brain damage that is caused by the brain not getting enough blood. Vascular dementia  is caused by conditions that reduce blood flow to the brain. Common causes of this condition include stroke and damage to small blood vessels in the brain. Treatment focuses on treating the underlying cause of vascular dementia and managing any risk factors. People with vascular dementia may need regular help at home or daily care from a family member or home health care worker. Contact a health care provider if you or your caregiver notices any new symptoms. This information is not intended to replace advice given to you by your health care provider. Make sure you discuss any questions you have with your health care provider. Document Revised: 04/03/2021 Document Reviewed: 01/23/2020 Elsevier Patient Education  [place].

## 2021-11-05 NOTE — Progress Notes (Signed)
[facility]    Provider:  Dr Jaynee Eagles Requesting Provider: Harlan Stains, MD Primary Care Provider:  Harlan Stains, MD  CC:  memory loss and small vessel disease on MRI  HPI:  Erin Ryan is a 88 y.o. female here as requested by Harlan Stains, MD for memory loss and small vessel disease on MRI.  She has a past medical history of asthma, hyperlipidemia, hypertension with chronic kidney disease, osteoporosis, anxiety, COPD, frailty, acquired hypothyroidism, lumbago with sciatica on the right side, cognitive decline, compression fracture of L3, decreased appetite, bilateral hearing loss, memory difficulty, glaucoma, scoliosis, questionable vascular dementia due to MRI on 09/10/2021.  I reviewed Dr. Orest Dikes notes: Daughter reports that patient has had more trouble with her memory, she has had a recent outbreak of shingles, daughter says that she has days that are good and some days that are better, she gets up several times to urinate, appetite is decreased, there is anxiety but stable, however she no longer enjoys watching TV or reading.  Daughter worries that her memory loss is worsened, she does have significant hearing difficulties, her level of attention seems to vary significantly from day-to-day, daughter feels like anxiety is well controlled but she does seem to be less engaged with life than previously, they switched from paroxetine to mirtazapine to see if that helps with her appetite.  Hearing aids have not been working as well.  More decline in the last several months.  No wandering at night.  Memory loss started about  ayear ago, her "phrasing was off", not being able to express herself as well, short-term memory loss, repeating the same questions 15 minutes later, saying the same things, she can't balance checkbook anymore, lives with daughter and she takes care of everything business wise, she has difficulty understanding letters like for medicare, very different prior  baseline where she of course completely independent, she does not drive anymore, hasn't driven since covid and started worrying is she could, she can fix coffee but can't cook, she can't run the dishwasher, can dress herself, she does not like to take a bath daughter has to encourage her to complete hygiene, no falls, less interest in doing things, no hallucinations, no delusions, no changes in personality but she is a little more irritable about certain things, poor awareness into her memory loss, her sister is in the late stages of dementia. She is more childish.   Reviewed notes, labs and imaging from outside physicians, which showed:  I reviewed labs, recent urinalysis was negative for UTI.  Labs collected June 13, 2021 showed a vitamin D level of 76, TSH of 0.5, CMP with BUN 26 and creatinine 1.14 EGFR 45 otherwise normal, B12 875, magnesium normal  MRI of the brain 09/02/2021: IMPRESSION: personally reviewed and agreed and reviewed images also with patient and  No evidence of acute intracranial abnormality. Moderate chronic small vessel ischemic changes within the cerebral white matter.Moderate generalized cerebral atrophy. Comparatively mild cerebellar atrophy.Suspected small arachnoid cyst overlying the posterior right frontal lobe. Mild mucosal thickening within the bilateral ethmoid and left maxillary sinuses.Trace fluid within the bilateral mastoid air cells.  Review of Systems: Patient complains of symptoms per HPI as well as the following symptoms gait abnormality. Pertinent negatives and positives per HPI. All others negative.   Social History   Socioeconomic History   Marital status: Widowed    Spouse name: Not on file   Number of children: 1   Years of education: Not on file  Highest education level: Not on file  Occupational History   Occupation: Retired    Fish farm manager: RETIRED    Comment: Scientist, research (physical sciences)  Tobacco Use   Smoking status: Never   Smokeless tobacco: Never   Substance and Sexual Activity   Alcohol use: No   Drug use: No   Sexual activity: Not on file  Other Topics Concern   Not on file  Social History Narrative   Lives with daughter.    Has 1 daughter, [name], and 2 grandkids   Social Determinants of Health   Financial Resource Strain: Not on file  Food Insecurity: Not on file  Transportation Needs: Not on file  Physical Activity: Not on file  Stress: Not on file  Social Connections: Not on file  Intimate Partner Violence: Not on file    Family History  Problem Relation Age of Onset   Heart disease Mother 68       Heart attack   Bladder Cancer Mother    Heart disease Father    Asthma Father    Heart attack Brother        Age undetermined    Past Medical History:  Diagnosis Date   Anxiety    Arthritis    COVID-19    04/2021   GERD (gastroesophageal reflux disease)    Hearing loss    has hearing aids   High cholesterol    Hypertension    Hypothyroid    Osteoporosis    Scoliosis    Thyroid disease     Patient Active Problem List   Diagnosis Date Noted   Moderate dementia without behavioral disturbance, psychotic disturbance, mood disturbance, or anxiety 11/05/2021   Tachycardia 10/13/2021   Upper airway cough syndrome 07/10/2017   Dyspnea on exertion 05/14/2015   Anxiety state 06/26/2014   Chronic kidney disease (CKD), stage III (moderate) ([place]) 06/26/2014   HLD (hyperlipidemia) 06/26/2014   Acid reflux 06/26/2014   Benign hypertensive kidney disease 06/26/2014   OP (osteoporosis) 06/26/2014   Peripheral neuralgia 06/26/2014   Adult hypothyroidism 06/26/2014   Arthralgia of hip or thigh 02/09/2014   GERD 12/06/2007   HYPERLIPIDEMIA 11/15/2007   HYPERTENSION 11/15/2007    Past Surgical History:  Procedure Laterality Date   CATARACT EXTRACTION     cysts removed from bilateral breasts     THYROIDECTOMY      Current Outpatient Medications  Medication Sig Dispense Refill   amLODipine (NORVASC) 2.5 MG  tablet Take 2.5 mg by mouth daily.     aspirin 81 MG tablet Take 81 mg by mouth daily.     atorvastatin (LIPITOR) 40 MG tablet Take 40 mg by mouth daily.     Calcium Citrate-Vitamin D (CALCIUM CITRATE + PO) Take 1 tablet 2 (two) times daily by mouth.     denosumab (PROLIA) 60 MG/ML SOSY injection See admin instructions.     donepezil (ARICEPT) 5 MG tablet Take 1 tablet (5 mg total) by mouth at bedtime. 30 tablet 1   DULoxetine (CYMBALTA) 30 MG capsule Take 30 mg by mouth daily.     levothyroxine (SYNTHROID) 125 MCG tablet daw-1 1 tablet on an empty stomach in the morning     mirtazapine (REMERON) 15 MG tablet Take 15 mg by mouth at bedtime.     omeprazole (PRILOSEC) 40 MG capsule Take 1 capsule (40 mg total) by mouth 2 (two) times daily before a meal. 180 capsule 3   furosemide (LASIX) 20 MG tablet Take 1 tablet (20 mg total) by  mouth every other day. 90 tablet 2   metoprolol tartrate (LOPRESSOR) 25 MG tablet Take 0.5 tablets (12.5 mg total) by mouth 2 (two) times daily. 180 tablet 1   No current facility-administered medications for this visit.    Allergies as of 11/05/2021 - Review Complete 11/05/2021  Allergen Reaction Noted   Hydrochlorothiazide     Lyrica [pregabalin] Other (See Comments) 11/05/2021   Diltiazem hcl er Rash 09/06/2021   Short ragweed pollen ext  01/31/2021    Vitals: BP 132/66    Pulse 79    Ht 5' 1"  (1.549 m)    Wt 110 lb 6.4 oz (50.1 kg)    BMI 20.86 kg/m  Last Weight:  Wt Readings from Last 1 Encounters:  11/05/21 110 lb 6.4 oz (50.1 kg)   Last Height:   Ht Readings from Last 1 Encounters:  11/05/21 5' 1"  (1.549 m)     Physical exam: Exam: Gen: NAD, frail                    CV: RRR, no MRG. No Carotid Bruits. No peripheral edema, warm, nontender Eyes: Conjunctivae clear without exudates or hemorrhage  Neuro: Detailed Neurologic Exam  Speech:    Sparse speech, not dysarthric Cognition: MMSE - Mini Mental State Exam 11/05/2021  Orientation to  time 4  Orientation to time comments season: fall  Orientation to Place 2  Registration 3  Attention/ Calculation 0  Recall 1  Language- name 2 objects 2  Language- repeat 0  Language- follow 3 step command 2  Language- read & follow direction 1  Write a sentence 1  Write a sentence-comments "I want to see [place]."  Copy design 0  Total score 16    Cranial Nerves:    The pupils are equal, round, and reactive to light. Pupils too small to visualize fundi.  Visual fields are full to threat. Extraocular movements are intact. Trigeminal sensation is intact and the muscles of mastication are normal. The face is symmetric. The palate elevates in the midline. Hearing impaired. Voice is hypophonic. Shoulder shrug is normal. The tongue has normal motion without fasciculations.   Coordination:    No dysmetria or ataxia  Gait:    Shuffling with a cane   Motor Observation:    Thin and frail, generalized atrophy Tone:    muscle tone.    Posture:    Slightly stooped    Strength:    Antigravity in all limbs, difficulty with setailed motor exam due to hearing impairment and dementia     Sensation: intact to LT     Reflex Exam:  DTR's:    Deep tendon reflexes in the upper and lower extremities are symmetrical bilaterally.   Toes:    The toes are equiv bilaterally.   Clonus:    Clonus is absent.    Assessment/Plan:  88 y.o. female here as requested by Harlan Stains, MD for memory loss and small vessel disease on MRI.  She has a past medical history of asthma, hyperlipidemia, hypertension with chronic kidney disease, osteoporosis, anxiety, COPD, frailty, acquired hypothyroidism, lumbago with sciatica on the right side, cognitive decline, compression fracture of L3, decreased appetite, bilateral hearing loss, memory difficulty, glaucoma, scoliosis, questionable vascular dementia due to MRI on 09/10/2021. 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 Would be  very difficult to test further at her stage and with her hearing loss, daughter declines any further testing we will start Aricept.  I spent  an extended time answering questions, reviewing images, explaining dementia to patient's daughter, gave lots of resources and dementia packet, discussed daughter's risk due to famiy history and recommend a book for daughter to read in addition to multiple other resources and websites and a dementia packet. Patient could not participate due to extreme hearing loss and dementia. 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  If she does well on 8m22mdauhter can mychart me or call and we can increase aricept to 78m58meds ordered this encounter  Medications   donepezil (ARICEPT) 5 MG tablet    Sig: Take 1 tablet (5 mg total) by mouth at bedtime.    Dispense:  30 tablet    Refill:  1    Cc: WhitHarlan Stains,  WhitHarlan Stains  AntoSarina Ill  [facility] [id number] [place] [id number] [phone] 75 minutes of face-to-face and non-face-to-face time with patient on the  1. Moderate dementia without behavioral disturbance, psychotic disturbance, mood disturbance, or anxiety, unspecified dementia type    diagnosis.  This included previsit chart review, lab review, study review, order entry, electronic health record documentation, patient education on the different diagnostic and therapeutic options, counseling and coordination of care, risks and benefits of management, compliance, or risk factor reduction

## 2021-11-12 ENCOUNTER — Encounter (HOSPITAL_BASED_OUTPATIENT_CLINIC_OR_DEPARTMENT_OTHER): Payer: Self-pay | Admitting: *Deleted

## 2021-11-12 ENCOUNTER — Emergency Department (HOSPITAL_BASED_OUTPATIENT_CLINIC_OR_DEPARTMENT_OTHER)
Admission: EM | Admit: 2021-11-12 | Discharge: 2021-11-12 | Disposition: A | Discharge status: Home / Self Care | Payer: Medicare Other | Attending: Emergency Medicine | Admitting: Emergency Medicine

## 2021-11-12 ENCOUNTER — Other Ambulatory Visit: Payer: Self-pay

## 2021-11-12 DIAGNOSIS — Z7982 Long term (current) use of aspirin: Secondary | ICD-10-CM | POA: Insufficient documentation

## 2021-11-12 DIAGNOSIS — R531 Weakness: Secondary | ICD-10-CM | POA: Insufficient documentation

## 2021-11-12 DIAGNOSIS — F0390 Unspecified dementia without behavioral disturbance: Secondary | ICD-10-CM | POA: Diagnosis not present

## 2021-11-12 DIAGNOSIS — R000 Tachycardia, unspecified: Secondary | ICD-10-CM | POA: Diagnosis not present

## 2021-11-12 DIAGNOSIS — F419 Anxiety disorder, unspecified: Secondary | ICD-10-CM | POA: Insufficient documentation

## 2021-11-12 DIAGNOSIS — R251 Tremor, unspecified: Secondary | ICD-10-CM | POA: Diagnosis not present

## 2021-11-12 LAB — COMPREHENSIVE METABOLIC PANEL
ALT: 22 U/L (ref 0–44)
AST: 31 U/L (ref 15–41)
Albumin: 3.6 g/dL (ref 3.5–5.0)
Alkaline Phosphatase: 74 U/L (ref 38–126)
Anion gap: 8 (ref 5–15)
BUN: 28 mg/dL — ABNORMAL HIGH (ref 8–23)
CO2: 28 mmol/L (ref 22–32)
Calcium: 9 mg/dL (ref 8.9–10.3)
Chloride: 99 mmol/L (ref 98–111)
Creatinine, Ser: 1 mg/dL (ref 0.44–1.00)
GFR, Estimated: 52 mL/min — ABNORMAL LOW (ref >60)
Glucose, Bld: 120 mg/dL — ABNORMAL HIGH (ref 70–99)
Potassium: 4.3 mmol/L (ref 3.5–5.1)
Sodium: 135 mmol/L (ref 135–145)
Total Bilirubin: 0.6 mg/dL (ref 0.3–1.2)
Total Protein: 7.1 g/dL (ref 6.5–8.1)

## 2021-11-12 LAB — URINALYSIS, ROUTINE W REFLEX MICROSCOPIC
Bilirubin Urine: NEGATIVE
Glucose, UA: NEGATIVE mg/dL
Hgb urine dipstick: NEGATIVE
Ketones, ur: NEGATIVE mg/dL
Nitrite: NEGATIVE
Protein, ur: NEGATIVE mg/dL
Specific Gravity, Urine: 1.015 (ref 1.005–1.030)
pH: 6 (ref 5.0–8.0)

## 2021-11-12 LAB — URINALYSIS, MICROSCOPIC (REFLEX)

## 2021-11-12 LAB — CBC WITH DIFFERENTIAL/PLATELET
Abs Immature Granulocytes: 0.02 10*3/uL (ref 0.00–0.07)
Basophils Absolute: 0.1 10*3/uL (ref 0.0–0.1)
Basophils Relative: 1 %
Eosinophils Absolute: 0.2 10*3/uL (ref 0.0–0.5)
Eosinophils Relative: 3 %
HCT: 40.3 % (ref 36.0–46.0)
Hemoglobin: 13.3 g/dL (ref 12.0–15.0)
Immature Granulocytes: 0 %
Lymphocytes Relative: 26 %
Lymphs Abs: 1.9 10*3/uL (ref 0.7–4.0)
MCH: 29.4 pg (ref 26.0–34.0)
MCHC: 33 g/dL (ref 30.0–36.0)
MCV: 89 fL (ref 80.0–100.0)
Monocytes Absolute: 1 10*3/uL (ref 0.1–1.0)
Monocytes Relative: 14 %
Neutro Abs: 4 10*3/uL (ref 1.7–7.7)
Neutrophils Relative %: 56 %
Platelets: 292 10*3/uL (ref 150–400)
RBC: 4.53 MIL/uL (ref 3.87–5.11)
RDW: 14.2 % (ref 11.5–15.5)
WBC: 7.1 10*3/uL (ref 4.0–10.5)
nRBC: 0 % (ref 0.0–0.2)

## 2021-11-12 NOTE — ED Triage Notes (Signed)
Last night she had shakes. No shaking at triage. No fever. Weakness. Last week she was diagnosed with dementia but the family does not want pt to be told.

## 2021-11-12 NOTE — ED Provider Notes (Signed)
[facility] EMERGENCY DEPARTMENT Provider Note   CSN: [id number] Arrival date & time: 11/12/21  1115     History  Chief Complaint  Patient presents with   Weakness    Erin Ryan is a 88 y.o. female.  Patient is a 88 year old female who presents with some shakiness.  She lives at home with her daughter.  She has a recent diagnosis of dementia and was started on Aricept last week per daughter.  Last night around 3 AM, her daughter had noticed that she was screaming and appeared to have had a nightmare.  The patient walked into the daughter's room and was very shaky.  She seemed very anxious and upset at the time.  She was still little bit shaky this morning but seem to be more back to baseline.  She seemed generally weak last night but this morning seems to be back to her baseline.  She has not had any difficulty walking or change in her mental status noted today.  No recent illnesses.  No cough or cold symptoms.  No fevers.  No chest pain or shortness of breath.  No difficulty with her balance.  Patient currently denies any complaints.  She says that she always has a little bit of a tremor but her daughter has not noticed this in the past.      Home Medications Prior to Admission medications   Medication Sig Start Date End Date Taking? Authorizing Provider  amLODipine (NORVASC) 2.5 MG tablet Take 2.5 mg by mouth daily.    [provider]  aspirin 81 MG tablet Take 81 mg by mouth daily.    [provider]  atorvastatin (LIPITOR) 40 MG tablet Take 40 mg by mouth daily.    [provider]  Calcium Citrate-Vitamin D (CALCIUM CITRATE + PO) Take 1 tablet 2 (two) times daily by mouth.    [provider]  denosumab (PROLIA) 60 MG/ML SOSY injection See admin instructions. 06/24/17   [provider]  donepezil (ARICEPT) 5 MG tablet Take 1 tablet (5 mg total) by mouth at bedtime. 11/05/21   Melvenia Beam, MD  DULoxetine (CYMBALTA) 30 MG  capsule Take 30 mg by mouth daily. 09/06/21   [provider]  furosemide (LASIX) 20 MG tablet Take 1 tablet (20 mg total) by mouth every other day. 07/26/21 10/24/21  Lendon Colonel, NP  levothyroxine (SYNTHROID) 125 MCG tablet daw-1 1 tablet on an empty stomach in the morning    [provider]  metoprolol tartrate (LOPRESSOR) 25 MG tablet Take 0.5 tablets (12.5 mg total) by mouth 2 (two) times daily. 07/05/21 10/14/21  Lendon Colonel, NP  mirtazapine (REMERON) 15 MG tablet Take 15 mg by mouth at bedtime. 10/08/21   [provider]  omeprazole (PRILOSEC) 40 MG capsule Take 1 capsule (40 mg total) by mouth 2 (two) times daily before a meal. 09/11/17   Tanda Rockers, MD      Allergies    Hydrochlorothiazide, Lyrica [pregabalin], Diltiazem hcl er, and Short ragweed pollen ext    Review of Systems   Review of Systems  Constitutional:  Negative for chills, diaphoresis, fatigue and fever.  HENT:  Negative for congestion, rhinorrhea and sneezing.   Eyes: Negative.   Respiratory:  Negative for cough, chest tightness and shortness of breath.   Cardiovascular:  Negative for chest pain and leg swelling.  Gastrointestinal:  Negative for abdominal pain, blood in stool, diarrhea, nausea and vomiting.  Genitourinary:  Negative for  difficulty urinating, flank pain, frequency and hematuria.  Musculoskeletal:  Negative for arthralgias and back pain.  Skin:  Negative for rash.  Neurological:  Positive for tremors. Negative for dizziness, speech difficulty, weakness, numbness and headaches.   Physical Exam Updated Vital Signs BP (!) 143/82    Pulse 81    Temp 98 F (36.7 C)    Resp (!) 28    Ht 5\' 1"  (1.549 m)    Wt 50.1 kg    SpO2 100%    BMI 20.87 kg/m  Physical Exam Constitutional:      Appearance: She is well-developed.  HENT:     Head: Normocephalic and atraumatic.  Eyes:     Pupils: Pupils are equal, round, and reactive to light.  Cardiovascular:     Rate  and Rhythm: Normal rate and regular rhythm.     Heart sounds: Normal heart sounds.  Pulmonary:     Effort: Pulmonary effort is normal. No respiratory distress.     Breath sounds: Normal breath sounds. No wheezing or rales.  Chest:     Chest wall: No tenderness.  Abdominal:     General: Bowel sounds are normal.     Palpations: Abdomen is soft.     Tenderness: There is no abdominal tenderness. There is no guarding or rebound.  Musculoskeletal:        General: Normal range of motion.     Cervical back: Normal range of motion and neck supple.  Lymphadenopathy:     Cervical: No cervical adenopathy.  Skin:    General: Skin is warm and dry.     Findings: No rash.  Neurological:     General: No focal deficit present.     Mental Status: She is alert and oriented to person, place, and time.     Comments: Oriented to person place and year. Motor 5/5 all extremities Sensation grossly intact to LT all extremities Finger to Nose intact, no pronator drift CN II-XII grossly intact      ED Results / Procedures / Treatments   Labs (all labs ordered are listed, but only abnormal results are displayed) Labs Reviewed  URINALYSIS, ROUTINE W REFLEX MICROSCOPIC - Abnormal; Notable for the following components:      Result Value   Leukocytes,Ua SMALL (*)    All other components within normal limits  COMPREHENSIVE METABOLIC PANEL - Abnormal; Notable for the following components:   Glucose, Bld 120 (*)    BUN 28 (*)    GFR, Estimated 52 (*)    All other components within normal limits  URINALYSIS, MICROSCOPIC (REFLEX) - Abnormal; Notable for the following components:   Bacteria, UA RARE (*)    All other components within normal limits  CBC WITH DIFFERENTIAL/PLATELET    EKG EKG Interpretation  Date/Time:  Tuesday November 12 2021 11:37:09 EST Ventricular Rate:  102 PR Interval:  182 QRS Duration: 66 QT Interval:  328 QTC Calculation: 427 R Axis:   72 Text Interpretation: Sinus  tachycardia Otherwise normal ECG When compared with ECG of 08-Jun-2002 12:15, PREVIOUS ECG IS PRESENT SINCE LAST TRACING HEART RATE HAS INCREASED Confirmed by Malvin Johns [phone]) on 11/12/2021 3:09:07 PM  Radiology No results found.  Procedures Procedures    Medications Ordered in ED Medications - No data to display  ED Course/ Medical Decision Making/ A&P                           Medical Decision Making  Amount and/or Complexity of Data Reviewed Labs: ordered.   Patient is a 88 year old who had an episode of shaking last night in association with a possible nightmare.  Patient apparently was very upset emotionally at that time.  She seems back to baseline today.  She does have a slight tremor when she holds her arms up.  She does not have any focal neurologic deficits or suggestions of a stroke.  I did review her records and saw that she recently had a visit with Dr. Jaynee Eagles with neurology.  I do not see any comments on a tremor at that point although the patient says that she has always had a baseline mild tremor.  Her labs reviewed and are nonconcerning.  I see that she had an MRI done in December which did not show any acute findings.  At this point I do not feel that she needs further inpatient evaluation with hospitalization.  I discussed these findings with the daughter and the patient.  I advised that they should contact her neurologist tomorrow regarding her tremor.  Return precautions were given.  Final Clinical Impression(s) / ED Diagnoses Final diagnoses:  Tremor    Rx / DC Orders ED Discharge Orders     None         Malvin Johns, MD 11/12/21 [phone]

## 2021-11-12 NOTE — Discharge Instructions (Signed)
Follow-up with your neurologist as discussed.  Return to emergency room if you have any worsening symptoms.

## 2021-11-12 NOTE — ED Notes (Signed)
Pt was given urine cup. Is aware of the necessity of obtaining urine.

## 2021-11-14 LAB — URINE CULTURE: Culture: 70000 — AB

## 2021-11-30 ENCOUNTER — Other Ambulatory Visit: Payer: Self-pay | Admitting: Neurology

## 2021-11-30 DIAGNOSIS — F03B0 Unspecified dementia, moderate, without behavioral disturbance, psychotic disturbance, mood disturbance, and anxiety: Secondary | ICD-10-CM

## 2021-12-25 DIAGNOSIS — J449 Chronic obstructive pulmonary disease, unspecified: Secondary | ICD-10-CM | POA: Diagnosis not present

## 2021-12-25 DIAGNOSIS — Z0000 Encounter for general adult medical examination without abnormal findings: Secondary | ICD-10-CM | POA: Diagnosis not present

## 2021-12-25 DIAGNOSIS — N183 Chronic kidney disease, stage 3 unspecified: Secondary | ICD-10-CM | POA: Diagnosis not present

## 2021-12-25 DIAGNOSIS — H6123 Impacted cerumen, bilateral: Secondary | ICD-10-CM | POA: Diagnosis not present

## 2021-12-25 DIAGNOSIS — K219 Gastro-esophageal reflux disease without esophagitis: Secondary | ICD-10-CM | POA: Diagnosis not present

## 2021-12-25 DIAGNOSIS — E441 Mild protein-calorie malnutrition: Secondary | ICD-10-CM | POA: Diagnosis not present

## 2021-12-25 DIAGNOSIS — F03B4 Unspecified dementia, moderate, with anxiety: Secondary | ICD-10-CM | POA: Diagnosis not present

## 2021-12-25 DIAGNOSIS — E785 Hyperlipidemia, unspecified: Secondary | ICD-10-CM | POA: Diagnosis not present

## 2021-12-25 DIAGNOSIS — I129 Hypertensive chronic kidney disease with stage 1 through stage 4 chronic kidney disease, or unspecified chronic kidney disease: Secondary | ICD-10-CM | POA: Diagnosis not present

## 2021-12-25 DIAGNOSIS — B0229 Other postherpetic nervous system involvement: Secondary | ICD-10-CM | POA: Diagnosis not present

## 2021-12-25 DIAGNOSIS — R54 Age-related physical debility: Secondary | ICD-10-CM | POA: Diagnosis not present

## 2021-12-25 DIAGNOSIS — E039 Hypothyroidism, unspecified: Secondary | ICD-10-CM | POA: Diagnosis not present

## 2021-12-28 ENCOUNTER — Other Ambulatory Visit: Payer: Self-pay | Admitting: Cardiology

## 2021-12-30 ENCOUNTER — Other Ambulatory Visit: Payer: Self-pay | Admitting: Neurology

## 2021-12-30 DIAGNOSIS — F03B0 Unspecified dementia, moderate, without behavioral disturbance, psychotic disturbance, mood disturbance, and anxiety: Secondary | ICD-10-CM

## 2022-01-14 DIAGNOSIS — M5451 Vertebrogenic low back pain: Secondary | ICD-10-CM | POA: Diagnosis not present

## 2022-01-29 ENCOUNTER — Other Ambulatory Visit: Payer: Self-pay | Admitting: Neurology

## 2022-01-29 DIAGNOSIS — F03B0 Unspecified dementia, moderate, without behavioral disturbance, psychotic disturbance, mood disturbance, and anxiety: Secondary | ICD-10-CM

## 2022-02-03 ENCOUNTER — Telehealth: Payer: Self-pay | Admitting: Neurology

## 2022-02-03 NOTE — Telephone Encounter (Signed)
Pt's daughter, Darl Householder request refill for donepezil (ARICEPT) 5 MG tablet at [id number]?

## 2022-02-03 NOTE — Telephone Encounter (Signed)
We also received a request from patient's pharmacy. Refill has been sent.  ?

## 2022-02-03 NOTE — Telephone Encounter (Signed)
Pt's daughter, Darl Householder (on [place]) wanted Dr. Jaynee Eagles to know mother does not know she is taking donepezil (ARICEPT) 5 MG tablet. Mother was upset after the last appt because she thought neurologist said she was crazy. ?

## 2022-02-11 DIAGNOSIS — M25551 Pain in right hip: Secondary | ICD-10-CM | POA: Diagnosis not present

## 2022-02-11 DIAGNOSIS — M5441 Lumbago with sciatica, right side: Secondary | ICD-10-CM | POA: Diagnosis not present

## 2022-02-12 DIAGNOSIS — N281 Cyst of kidney, acquired: Secondary | ICD-10-CM | POA: Diagnosis not present

## 2022-02-12 DIAGNOSIS — E785 Hyperlipidemia, unspecified: Secondary | ICD-10-CM | POA: Diagnosis not present

## 2022-02-12 DIAGNOSIS — J449 Chronic obstructive pulmonary disease, unspecified: Secondary | ICD-10-CM | POA: Diagnosis not present

## 2022-02-12 DIAGNOSIS — I5032 Chronic diastolic (congestive) heart failure: Secondary | ICD-10-CM | POA: Diagnosis not present

## 2022-02-12 DIAGNOSIS — F411 Generalized anxiety disorder: Secondary | ICD-10-CM | POA: Diagnosis not present

## 2022-02-12 DIAGNOSIS — I130 Hypertensive heart and chronic kidney disease with heart failure and stage 1 through stage 4 chronic kidney disease, or unspecified chronic kidney disease: Secondary | ICD-10-CM | POA: Diagnosis not present

## 2022-02-12 DIAGNOSIS — M1990 Unspecified osteoarthritis, unspecified site: Secondary | ICD-10-CM | POA: Diagnosis not present

## 2022-02-12 DIAGNOSIS — M8580 Other specified disorders of bone density and structure, unspecified site: Secondary | ICD-10-CM | POA: Diagnosis not present

## 2022-02-12 DIAGNOSIS — L900 Lichen sclerosus et atrophicus: Secondary | ICD-10-CM | POA: Diagnosis not present

## 2022-02-12 DIAGNOSIS — E441 Mild protein-calorie malnutrition: Secondary | ICD-10-CM | POA: Diagnosis not present

## 2022-02-12 DIAGNOSIS — M5136 Other intervertebral disc degeneration, lumbar region: Secondary | ICD-10-CM | POA: Diagnosis not present

## 2022-02-12 DIAGNOSIS — H409 Unspecified glaucoma: Secondary | ICD-10-CM | POA: Diagnosis not present

## 2022-02-12 DIAGNOSIS — F0284 Dementia in other diseases classified elsewhere, unspecified severity, with anxiety: Secondary | ICD-10-CM | POA: Diagnosis not present

## 2022-02-12 DIAGNOSIS — Z9181 History of falling: Secondary | ICD-10-CM | POA: Diagnosis not present

## 2022-02-12 DIAGNOSIS — M4856XD Collapsed vertebra, not elsewhere classified, lumbar region, subsequent encounter for fracture with routine healing: Secondary | ICD-10-CM | POA: Diagnosis not present

## 2022-02-12 DIAGNOSIS — Z8616 Personal history of COVID-19: Secondary | ICD-10-CM | POA: Diagnosis not present

## 2022-02-12 DIAGNOSIS — M47816 Spondylosis without myelopathy or radiculopathy, lumbar region: Secondary | ICD-10-CM | POA: Diagnosis not present

## 2022-02-12 DIAGNOSIS — E039 Hypothyroidism, unspecified: Secondary | ICD-10-CM | POA: Diagnosis not present

## 2022-02-12 DIAGNOSIS — B0229 Other postherpetic nervous system involvement: Secondary | ICD-10-CM | POA: Diagnosis not present

## 2022-02-12 DIAGNOSIS — K219 Gastro-esophageal reflux disease without esophagitis: Secondary | ICD-10-CM | POA: Diagnosis not present

## 2022-02-12 DIAGNOSIS — N183 Chronic kidney disease, stage 3 unspecified: Secondary | ICD-10-CM | POA: Diagnosis not present

## 2022-02-12 DIAGNOSIS — H9193 Unspecified hearing loss, bilateral: Secondary | ICD-10-CM | POA: Diagnosis not present

## 2022-02-12 DIAGNOSIS — M419 Scoliosis, unspecified: Secondary | ICD-10-CM | POA: Diagnosis not present

## 2022-02-12 DIAGNOSIS — J4520 Mild intermittent asthma, uncomplicated: Secondary | ICD-10-CM | POA: Diagnosis not present

## 2022-02-12 DIAGNOSIS — G301 Alzheimer's disease with late onset: Secondary | ICD-10-CM | POA: Diagnosis not present

## 2022-02-19 DIAGNOSIS — M47816 Spondylosis without myelopathy or radiculopathy, lumbar region: Secondary | ICD-10-CM | POA: Diagnosis not present

## 2022-02-19 DIAGNOSIS — I130 Hypertensive heart and chronic kidney disease with heart failure and stage 1 through stage 4 chronic kidney disease, or unspecified chronic kidney disease: Secondary | ICD-10-CM | POA: Diagnosis not present

## 2022-02-19 DIAGNOSIS — M5136 Other intervertebral disc degeneration, lumbar region: Secondary | ICD-10-CM | POA: Diagnosis not present

## 2022-02-19 DIAGNOSIS — I5032 Chronic diastolic (congestive) heart failure: Secondary | ICD-10-CM | POA: Diagnosis not present

## 2022-02-19 DIAGNOSIS — M4856XD Collapsed vertebra, not elsewhere classified, lumbar region, subsequent encounter for fracture with routine healing: Secondary | ICD-10-CM | POA: Diagnosis not present

## 2022-02-19 DIAGNOSIS — N183 Chronic kidney disease, stage 3 unspecified: Secondary | ICD-10-CM | POA: Diagnosis not present

## 2022-02-24 DIAGNOSIS — M5136 Other intervertebral disc degeneration, lumbar region: Secondary | ICD-10-CM | POA: Diagnosis not present

## 2022-02-24 DIAGNOSIS — M79644 Pain in right finger(s): Secondary | ICD-10-CM | POA: Diagnosis not present

## 2022-02-24 DIAGNOSIS — I5032 Chronic diastolic (congestive) heart failure: Secondary | ICD-10-CM | POA: Diagnosis not present

## 2022-02-24 DIAGNOSIS — I130 Hypertensive heart and chronic kidney disease with heart failure and stage 1 through stage 4 chronic kidney disease, or unspecified chronic kidney disease: Secondary | ICD-10-CM | POA: Diagnosis not present

## 2022-02-24 DIAGNOSIS — N183 Chronic kidney disease, stage 3 unspecified: Secondary | ICD-10-CM | POA: Diagnosis not present

## 2022-02-24 DIAGNOSIS — S63003A Unspecified subluxation of unspecified wrist and hand, initial encounter: Secondary | ICD-10-CM | POA: Diagnosis not present

## 2022-02-24 DIAGNOSIS — M4856XD Collapsed vertebra, not elsewhere classified, lumbar region, subsequent encounter for fracture with routine healing: Secondary | ICD-10-CM | POA: Diagnosis not present

## 2022-02-24 DIAGNOSIS — M47816 Spondylosis without myelopathy or radiculopathy, lumbar region: Secondary | ICD-10-CM | POA: Diagnosis not present

## 2022-02-25 DIAGNOSIS — M5136 Other intervertebral disc degeneration, lumbar region: Secondary | ICD-10-CM | POA: Diagnosis not present

## 2022-02-25 DIAGNOSIS — I130 Hypertensive heart and chronic kidney disease with heart failure and stage 1 through stage 4 chronic kidney disease, or unspecified chronic kidney disease: Secondary | ICD-10-CM | POA: Diagnosis not present

## 2022-02-25 DIAGNOSIS — M4856XD Collapsed vertebra, not elsewhere classified, lumbar region, subsequent encounter for fracture with routine healing: Secondary | ICD-10-CM | POA: Diagnosis not present

## 2022-02-25 DIAGNOSIS — M47816 Spondylosis without myelopathy or radiculopathy, lumbar region: Secondary | ICD-10-CM | POA: Diagnosis not present

## 2022-02-25 DIAGNOSIS — N183 Chronic kidney disease, stage 3 unspecified: Secondary | ICD-10-CM | POA: Diagnosis not present

## 2022-02-25 DIAGNOSIS — I5032 Chronic diastolic (congestive) heart failure: Secondary | ICD-10-CM | POA: Diagnosis not present

## 2022-02-27 DIAGNOSIS — Z961 Presence of intraocular lens: Secondary | ICD-10-CM | POA: Diagnosis not present

## 2022-02-27 DIAGNOSIS — H31001 Unspecified chorioretinal scars, right eye: Secondary | ICD-10-CM | POA: Diagnosis not present

## 2022-02-27 DIAGNOSIS — M5136 Other intervertebral disc degeneration, lumbar region: Secondary | ICD-10-CM | POA: Diagnosis not present

## 2022-02-27 DIAGNOSIS — I130 Hypertensive heart and chronic kidney disease with heart failure and stage 1 through stage 4 chronic kidney disease, or unspecified chronic kidney disease: Secondary | ICD-10-CM | POA: Diagnosis not present

## 2022-02-27 DIAGNOSIS — M47816 Spondylosis without myelopathy or radiculopathy, lumbar region: Secondary | ICD-10-CM | POA: Diagnosis not present

## 2022-02-27 DIAGNOSIS — I5032 Chronic diastolic (congestive) heart failure: Secondary | ICD-10-CM | POA: Diagnosis not present

## 2022-02-27 DIAGNOSIS — N183 Chronic kidney disease, stage 3 unspecified: Secondary | ICD-10-CM | POA: Diagnosis not present

## 2022-02-27 DIAGNOSIS — H524 Presbyopia: Secondary | ICD-10-CM | POA: Diagnosis not present

## 2022-02-27 DIAGNOSIS — M4856XD Collapsed vertebra, not elsewhere classified, lumbar region, subsequent encounter for fracture with routine healing: Secondary | ICD-10-CM | POA: Diagnosis not present

## 2022-02-28 ENCOUNTER — Other Ambulatory Visit: Payer: Self-pay | Admitting: Neurology

## 2022-02-28 DIAGNOSIS — F03B0 Unspecified dementia, moderate, without behavioral disturbance, psychotic disturbance, mood disturbance, and anxiety: Secondary | ICD-10-CM

## 2022-03-01 DIAGNOSIS — R519 Headache, unspecified: Secondary | ICD-10-CM | POA: Diagnosis not present

## 2022-03-01 DIAGNOSIS — S3993XA Unspecified injury of pelvis, initial encounter: Secondary | ICD-10-CM | POA: Diagnosis not present

## 2022-03-01 DIAGNOSIS — Z041 Encounter for examination and observation following transport accident: Secondary | ICD-10-CM | POA: Diagnosis not present

## 2022-03-01 DIAGNOSIS — S0990XA Unspecified injury of head, initial encounter: Secondary | ICD-10-CM | POA: Diagnosis not present

## 2022-03-01 DIAGNOSIS — S3991XA Unspecified injury of abdomen, initial encounter: Secondary | ICD-10-CM | POA: Diagnosis not present

## 2022-03-01 DIAGNOSIS — S299XXA Unspecified injury of thorax, initial encounter: Secondary | ICD-10-CM | POA: Diagnosis not present

## 2022-03-01 DIAGNOSIS — S199XXA Unspecified injury of neck, initial encounter: Secondary | ICD-10-CM | POA: Diagnosis not present

## 2022-03-03 DIAGNOSIS — I5032 Chronic diastolic (congestive) heart failure: Secondary | ICD-10-CM | POA: Diagnosis not present

## 2022-03-03 DIAGNOSIS — I130 Hypertensive heart and chronic kidney disease with heart failure and stage 1 through stage 4 chronic kidney disease, or unspecified chronic kidney disease: Secondary | ICD-10-CM | POA: Diagnosis not present

## 2022-03-03 DIAGNOSIS — M47816 Spondylosis without myelopathy or radiculopathy, lumbar region: Secondary | ICD-10-CM | POA: Diagnosis not present

## 2022-03-03 DIAGNOSIS — N183 Chronic kidney disease, stage 3 unspecified: Secondary | ICD-10-CM | POA: Diagnosis not present

## 2022-03-03 DIAGNOSIS — M5136 Other intervertebral disc degeneration, lumbar region: Secondary | ICD-10-CM | POA: Diagnosis not present

## 2022-03-03 DIAGNOSIS — M4856XD Collapsed vertebra, not elsewhere classified, lumbar region, subsequent encounter for fracture with routine healing: Secondary | ICD-10-CM | POA: Diagnosis not present

## 2022-03-07 DIAGNOSIS — M4856XD Collapsed vertebra, not elsewhere classified, lumbar region, subsequent encounter for fracture with routine healing: Secondary | ICD-10-CM | POA: Diagnosis not present

## 2022-03-07 DIAGNOSIS — M5136 Other intervertebral disc degeneration, lumbar region: Secondary | ICD-10-CM | POA: Diagnosis not present

## 2022-03-07 DIAGNOSIS — I130 Hypertensive heart and chronic kidney disease with heart failure and stage 1 through stage 4 chronic kidney disease, or unspecified chronic kidney disease: Secondary | ICD-10-CM | POA: Diagnosis not present

## 2022-03-07 DIAGNOSIS — N183 Chronic kidney disease, stage 3 unspecified: Secondary | ICD-10-CM | POA: Diagnosis not present

## 2022-03-07 DIAGNOSIS — M47816 Spondylosis without myelopathy or radiculopathy, lumbar region: Secondary | ICD-10-CM | POA: Diagnosis not present

## 2022-03-07 DIAGNOSIS — I5032 Chronic diastolic (congestive) heart failure: Secondary | ICD-10-CM | POA: Diagnosis not present

## 2022-03-10 DIAGNOSIS — I5032 Chronic diastolic (congestive) heart failure: Secondary | ICD-10-CM | POA: Diagnosis not present

## 2022-03-10 DIAGNOSIS — N183 Chronic kidney disease, stage 3 unspecified: Secondary | ICD-10-CM | POA: Diagnosis not present

## 2022-03-10 DIAGNOSIS — M47816 Spondylosis without myelopathy or radiculopathy, lumbar region: Secondary | ICD-10-CM | POA: Diagnosis not present

## 2022-03-10 DIAGNOSIS — M5136 Other intervertebral disc degeneration, lumbar region: Secondary | ICD-10-CM | POA: Diagnosis not present

## 2022-03-10 DIAGNOSIS — I130 Hypertensive heart and chronic kidney disease with heart failure and stage 1 through stage 4 chronic kidney disease, or unspecified chronic kidney disease: Secondary | ICD-10-CM | POA: Diagnosis not present

## 2022-03-10 DIAGNOSIS — M4856XD Collapsed vertebra, not elsewhere classified, lumbar region, subsequent encounter for fracture with routine healing: Secondary | ICD-10-CM | POA: Diagnosis not present

## 2022-03-11 DIAGNOSIS — M47817 Spondylosis without myelopathy or radiculopathy, lumbosacral region: Secondary | ICD-10-CM | POA: Diagnosis not present

## 2022-03-11 DIAGNOSIS — M5136 Other intervertebral disc degeneration, lumbar region: Secondary | ICD-10-CM | POA: Diagnosis not present

## 2022-03-11 DIAGNOSIS — M4186 Other forms of scoliosis, lumbar region: Secondary | ICD-10-CM | POA: Diagnosis not present

## 2022-03-11 DIAGNOSIS — I130 Hypertensive heart and chronic kidney disease with heart failure and stage 1 through stage 4 chronic kidney disease, or unspecified chronic kidney disease: Secondary | ICD-10-CM | POA: Diagnosis not present

## 2022-03-11 DIAGNOSIS — M47816 Spondylosis without myelopathy or radiculopathy, lumbar region: Secondary | ICD-10-CM | POA: Diagnosis not present

## 2022-03-11 DIAGNOSIS — I5032 Chronic diastolic (congestive) heart failure: Secondary | ICD-10-CM | POA: Diagnosis not present

## 2022-03-11 DIAGNOSIS — N183 Chronic kidney disease, stage 3 unspecified: Secondary | ICD-10-CM | POA: Diagnosis not present

## 2022-03-11 DIAGNOSIS — M4856XD Collapsed vertebra, not elsewhere classified, lumbar region, subsequent encounter for fracture with routine healing: Secondary | ICD-10-CM | POA: Diagnosis not present

## 2022-03-11 DIAGNOSIS — S32030D Wedge compression fracture of third lumbar vertebra, subsequent encounter for fracture with routine healing: Secondary | ICD-10-CM | POA: Diagnosis not present

## 2022-03-14 DIAGNOSIS — F0284 Dementia in other diseases classified elsewhere, unspecified severity, with anxiety: Secondary | ICD-10-CM | POA: Diagnosis not present

## 2022-03-14 DIAGNOSIS — E039 Hypothyroidism, unspecified: Secondary | ICD-10-CM | POA: Diagnosis not present

## 2022-03-14 DIAGNOSIS — M1990 Unspecified osteoarthritis, unspecified site: Secondary | ICD-10-CM | POA: Diagnosis not present

## 2022-03-14 DIAGNOSIS — E785 Hyperlipidemia, unspecified: Secondary | ICD-10-CM | POA: Diagnosis not present

## 2022-03-14 DIAGNOSIS — Z8616 Personal history of COVID-19: Secondary | ICD-10-CM | POA: Diagnosis not present

## 2022-03-14 DIAGNOSIS — I5032 Chronic diastolic (congestive) heart failure: Secondary | ICD-10-CM | POA: Diagnosis not present

## 2022-03-14 DIAGNOSIS — H9193 Unspecified hearing loss, bilateral: Secondary | ICD-10-CM | POA: Diagnosis not present

## 2022-03-14 DIAGNOSIS — L900 Lichen sclerosus et atrophicus: Secondary | ICD-10-CM | POA: Diagnosis not present

## 2022-03-14 DIAGNOSIS — G301 Alzheimer's disease with late onset: Secondary | ICD-10-CM | POA: Diagnosis not present

## 2022-03-14 DIAGNOSIS — M47816 Spondylosis without myelopathy or radiculopathy, lumbar region: Secondary | ICD-10-CM | POA: Diagnosis not present

## 2022-03-14 DIAGNOSIS — J4520 Mild intermittent asthma, uncomplicated: Secondary | ICD-10-CM | POA: Diagnosis not present

## 2022-03-14 DIAGNOSIS — E441 Mild protein-calorie malnutrition: Secondary | ICD-10-CM | POA: Diagnosis not present

## 2022-03-14 DIAGNOSIS — N183 Chronic kidney disease, stage 3 unspecified: Secondary | ICD-10-CM | POA: Diagnosis not present

## 2022-03-14 DIAGNOSIS — H409 Unspecified glaucoma: Secondary | ICD-10-CM | POA: Diagnosis not present

## 2022-03-14 DIAGNOSIS — Z9181 History of falling: Secondary | ICD-10-CM | POA: Diagnosis not present

## 2022-03-14 DIAGNOSIS — M8580 Other specified disorders of bone density and structure, unspecified site: Secondary | ICD-10-CM | POA: Diagnosis not present

## 2022-03-14 DIAGNOSIS — J449 Chronic obstructive pulmonary disease, unspecified: Secondary | ICD-10-CM | POA: Diagnosis not present

## 2022-03-14 DIAGNOSIS — N281 Cyst of kidney, acquired: Secondary | ICD-10-CM | POA: Diagnosis not present

## 2022-03-14 DIAGNOSIS — M4856XD Collapsed vertebra, not elsewhere classified, lumbar region, subsequent encounter for fracture with routine healing: Secondary | ICD-10-CM | POA: Diagnosis not present

## 2022-03-14 DIAGNOSIS — F411 Generalized anxiety disorder: Secondary | ICD-10-CM | POA: Diagnosis not present

## 2022-03-14 DIAGNOSIS — M5136 Other intervertebral disc degeneration, lumbar region: Secondary | ICD-10-CM | POA: Diagnosis not present

## 2022-03-14 DIAGNOSIS — K219 Gastro-esophageal reflux disease without esophagitis: Secondary | ICD-10-CM | POA: Diagnosis not present

## 2022-03-14 DIAGNOSIS — M419 Scoliosis, unspecified: Secondary | ICD-10-CM | POA: Diagnosis not present

## 2022-03-14 DIAGNOSIS — I130 Hypertensive heart and chronic kidney disease with heart failure and stage 1 through stage 4 chronic kidney disease, or unspecified chronic kidney disease: Secondary | ICD-10-CM | POA: Diagnosis not present

## 2022-03-14 DIAGNOSIS — B0229 Other postherpetic nervous system involvement: Secondary | ICD-10-CM | POA: Diagnosis not present

## 2022-03-19 DIAGNOSIS — I5032 Chronic diastolic (congestive) heart failure: Secondary | ICD-10-CM | POA: Diagnosis not present

## 2022-03-19 DIAGNOSIS — N183 Chronic kidney disease, stage 3 unspecified: Secondary | ICD-10-CM | POA: Diagnosis not present

## 2022-03-19 DIAGNOSIS — M4856XD Collapsed vertebra, not elsewhere classified, lumbar region, subsequent encounter for fracture with routine healing: Secondary | ICD-10-CM | POA: Diagnosis not present

## 2022-03-19 DIAGNOSIS — M5136 Other intervertebral disc degeneration, lumbar region: Secondary | ICD-10-CM | POA: Diagnosis not present

## 2022-03-19 DIAGNOSIS — I130 Hypertensive heart and chronic kidney disease with heart failure and stage 1 through stage 4 chronic kidney disease, or unspecified chronic kidney disease: Secondary | ICD-10-CM | POA: Diagnosis not present

## 2022-03-19 DIAGNOSIS — M47816 Spondylosis without myelopathy or radiculopathy, lumbar region: Secondary | ICD-10-CM | POA: Diagnosis not present

## 2022-03-21 DIAGNOSIS — M4856XD Collapsed vertebra, not elsewhere classified, lumbar region, subsequent encounter for fracture with routine healing: Secondary | ICD-10-CM | POA: Diagnosis not present

## 2022-03-21 DIAGNOSIS — I130 Hypertensive heart and chronic kidney disease with heart failure and stage 1 through stage 4 chronic kidney disease, or unspecified chronic kidney disease: Secondary | ICD-10-CM | POA: Diagnosis not present

## 2022-03-21 DIAGNOSIS — M47816 Spondylosis without myelopathy or radiculopathy, lumbar region: Secondary | ICD-10-CM | POA: Diagnosis not present

## 2022-03-21 DIAGNOSIS — I5032 Chronic diastolic (congestive) heart failure: Secondary | ICD-10-CM | POA: Diagnosis not present

## 2022-03-21 DIAGNOSIS — N183 Chronic kidney disease, stage 3 unspecified: Secondary | ICD-10-CM | POA: Diagnosis not present

## 2022-03-21 DIAGNOSIS — M5136 Other intervertebral disc degeneration, lumbar region: Secondary | ICD-10-CM | POA: Diagnosis not present

## 2022-03-25 DIAGNOSIS — M5136 Other intervertebral disc degeneration, lumbar region: Secondary | ICD-10-CM | POA: Diagnosis not present

## 2022-03-25 DIAGNOSIS — N183 Chronic kidney disease, stage 3 unspecified: Secondary | ICD-10-CM | POA: Diagnosis not present

## 2022-03-25 DIAGNOSIS — M47816 Spondylosis without myelopathy or radiculopathy, lumbar region: Secondary | ICD-10-CM | POA: Diagnosis not present

## 2022-03-25 DIAGNOSIS — I130 Hypertensive heart and chronic kidney disease with heart failure and stage 1 through stage 4 chronic kidney disease, or unspecified chronic kidney disease: Secondary | ICD-10-CM | POA: Diagnosis not present

## 2022-03-25 DIAGNOSIS — I5032 Chronic diastolic (congestive) heart failure: Secondary | ICD-10-CM | POA: Diagnosis not present

## 2022-03-25 DIAGNOSIS — M4856XD Collapsed vertebra, not elsewhere classified, lumbar region, subsequent encounter for fracture with routine healing: Secondary | ICD-10-CM | POA: Diagnosis not present

## 2022-03-26 DIAGNOSIS — M47816 Spondylosis without myelopathy or radiculopathy, lumbar region: Secondary | ICD-10-CM | POA: Diagnosis not present

## 2022-03-26 DIAGNOSIS — M4856XD Collapsed vertebra, not elsewhere classified, lumbar region, subsequent encounter for fracture with routine healing: Secondary | ICD-10-CM | POA: Diagnosis not present

## 2022-03-26 DIAGNOSIS — I130 Hypertensive heart and chronic kidney disease with heart failure and stage 1 through stage 4 chronic kidney disease, or unspecified chronic kidney disease: Secondary | ICD-10-CM | POA: Diagnosis not present

## 2022-03-26 DIAGNOSIS — N183 Chronic kidney disease, stage 3 unspecified: Secondary | ICD-10-CM | POA: Diagnosis not present

## 2022-03-26 DIAGNOSIS — I5032 Chronic diastolic (congestive) heart failure: Secondary | ICD-10-CM | POA: Diagnosis not present

## 2022-03-26 DIAGNOSIS — M533 Sacrococcygeal disorders, not elsewhere classified: Secondary | ICD-10-CM | POA: Diagnosis not present

## 2022-03-26 DIAGNOSIS — M5136 Other intervertebral disc degeneration, lumbar region: Secondary | ICD-10-CM | POA: Diagnosis not present

## 2022-03-27 ENCOUNTER — Other Ambulatory Visit: Payer: Self-pay | Admitting: Neurology

## 2022-03-27 DIAGNOSIS — M533 Sacrococcygeal disorders, not elsewhere classified: Secondary | ICD-10-CM | POA: Diagnosis not present

## 2022-03-27 DIAGNOSIS — F03B0 Unspecified dementia, moderate, without behavioral disturbance, psychotic disturbance, mood disturbance, and anxiety: Secondary | ICD-10-CM

## 2022-04-03 DIAGNOSIS — I130 Hypertensive heart and chronic kidney disease with heart failure and stage 1 through stage 4 chronic kidney disease, or unspecified chronic kidney disease: Secondary | ICD-10-CM | POA: Diagnosis not present

## 2022-04-03 DIAGNOSIS — I5032 Chronic diastolic (congestive) heart failure: Secondary | ICD-10-CM | POA: Diagnosis not present

## 2022-04-03 DIAGNOSIS — M5136 Other intervertebral disc degeneration, lumbar region: Secondary | ICD-10-CM | POA: Diagnosis not present

## 2022-04-03 DIAGNOSIS — M47816 Spondylosis without myelopathy or radiculopathy, lumbar region: Secondary | ICD-10-CM | POA: Diagnosis not present

## 2022-04-03 DIAGNOSIS — N183 Chronic kidney disease, stage 3 unspecified: Secondary | ICD-10-CM | POA: Diagnosis not present

## 2022-04-03 DIAGNOSIS — M4856XD Collapsed vertebra, not elsewhere classified, lumbar region, subsequent encounter for fracture with routine healing: Secondary | ICD-10-CM | POA: Diagnosis not present

## 2022-04-07 DIAGNOSIS — M5136 Other intervertebral disc degeneration, lumbar region: Secondary | ICD-10-CM | POA: Diagnosis not present

## 2022-04-07 DIAGNOSIS — M47816 Spondylosis without myelopathy or radiculopathy, lumbar region: Secondary | ICD-10-CM | POA: Diagnosis not present

## 2022-04-07 DIAGNOSIS — M4856XD Collapsed vertebra, not elsewhere classified, lumbar region, subsequent encounter for fracture with routine healing: Secondary | ICD-10-CM | POA: Diagnosis not present

## 2022-04-07 DIAGNOSIS — N183 Chronic kidney disease, stage 3 unspecified: Secondary | ICD-10-CM | POA: Diagnosis not present

## 2022-04-07 DIAGNOSIS — I5032 Chronic diastolic (congestive) heart failure: Secondary | ICD-10-CM | POA: Diagnosis not present

## 2022-04-07 DIAGNOSIS — I130 Hypertensive heart and chronic kidney disease with heart failure and stage 1 through stage 4 chronic kidney disease, or unspecified chronic kidney disease: Secondary | ICD-10-CM | POA: Diagnosis not present

## 2022-04-14 ENCOUNTER — Telehealth: Payer: Self-pay | Admitting: Neurology

## 2022-04-14 NOTE — Telephone Encounter (Signed)
I placed a yellow message with daughters request.

## 2022-04-14 NOTE — Telephone Encounter (Signed)
FYI pt's daughter(on [place]) has had to change pt's f/u due to her being out of town (with assistance of POD 4 pt has a slot for 8/29) daughter asked that it be noted that when leaving the appointment with Dr Jaynee Eagles pt was very upset and emotional as a result of what she understood Dr Jaynee Eagles to relay to daughter.  Pt stated over and over to her daughter that she is not crazy (fearing that she would end up like her sister who recently passed from Alzheimer's). Pt's daughter asked that it be noted that what she has explained to pt is that she comes here for preventative measures to ensure she does not end up like her sister.  Pt's daughter states she gives pt her donepezil (ARICEPT) 5 MG tablet, without her knowing what it really is.  Daughter is not asking for a call back on this but just wanted it known re: how this should be approached on future appointments re:her Alzheimer's.

## 2022-04-29 DIAGNOSIS — M810 Age-related osteoporosis without current pathological fracture: Secondary | ICD-10-CM | POA: Diagnosis not present

## 2022-05-07 IMAGING — MR MR HEAD W/O CM
11 series · 48 of 48 positions shown · non-contrast
Comparison: Cervical spine MRI 07/24/2003.

CLINICAL DATA: Memory loss. Additional history provided by scanning
technologist: Confusion, difficulty walking, weakness in bilateral
upper and lower extremities.

EXAM:
MRI HEAD WITHOUT CONTRAST
TECHNIQUE: Multiplanar, multiecho pulse sequences of the brain and surrounding
structures were obtained without intravenous contrast.

[Series 2: T1 · sagittal · 5.0mm · 0.45mm/px · 3 of 24 slices shown (1 of 2)]
[im 1/24]
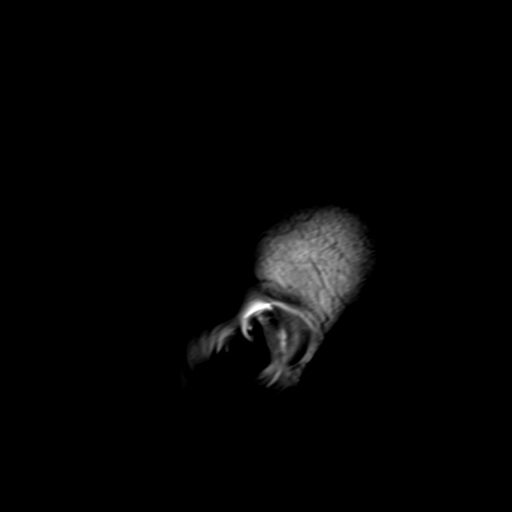
[im 12/24]
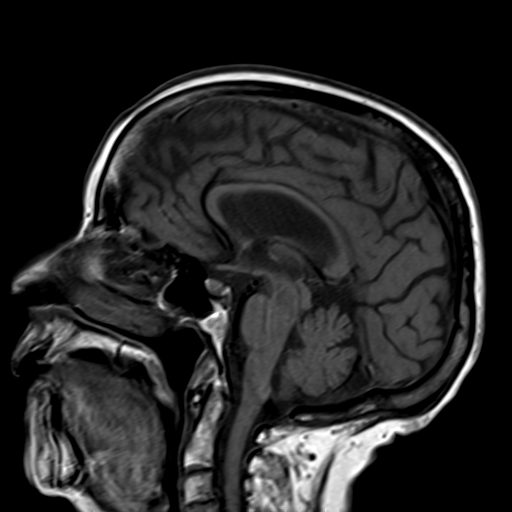
[im 24/24]
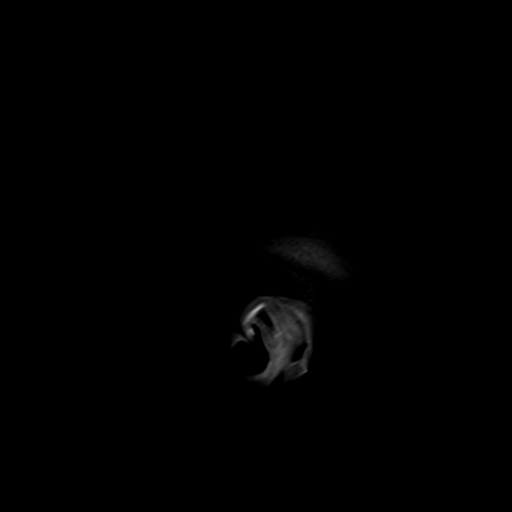

[Series 3: ax ep2d_diff_3 rep · axial · 3.0mm · 1.80mm/px · z∈[-35,+102]mm · 8 of 100 slices shown]
[im 1/100]
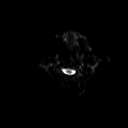
[im 15/100]
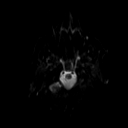
[im 29/100]
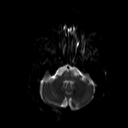
[im 43/100]
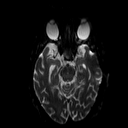
[im 57/100]
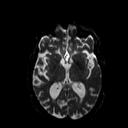
[im 71/100]
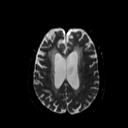
[im 85/100]
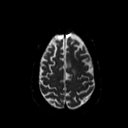
[im 100/100]
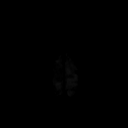

[Series 4: ax ep2d_diff_3 rep_adc · axial · 3.0mm · 1.80mm/px · z∈[-35,+102]mm · 4 of 49 slices shown]
[im 1/49]
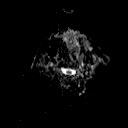
[im 17/49]
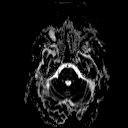
[im 33/49]
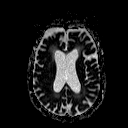
[im 49/49]
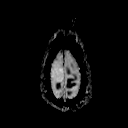

[Series 5: cor ep2d_diffusion · coronal · 5.0mm · 1.77mm/px · 4 of 50 slices shown]
[im 1/50]
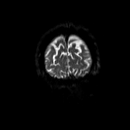
[im 17/50]
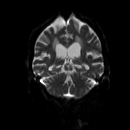
[im 33/50]
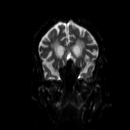
[im 50/50]
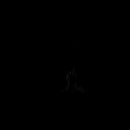

[Series 6: cor ep2d_diffusion_adc · coronal · 5.0mm · 1.77mm/px · 2 of 24 slices shown]
[im 1/24]
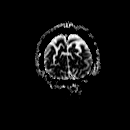
[im 24/24]
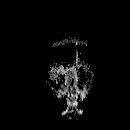

[Series 7: T2 · axial · 5.0mm · 0.51mm/px · z∈[-31,+98]mm · 2 of 24 slices shown (1 of 2)]
[im 1/24]
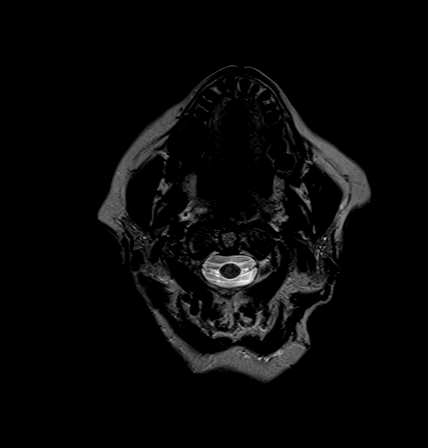
[im 24/24]
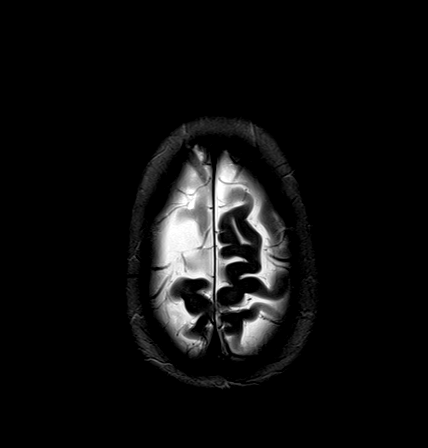

[Series 8: mip_images(sw) · axial · 24.0mm · 0.90mm/px · z∈[-28,+95]mm · 4 of 45 slices shown]
[im 1/45]
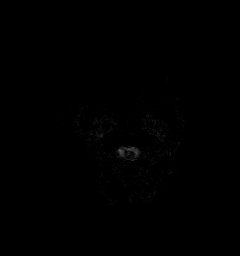
[im 15/45]
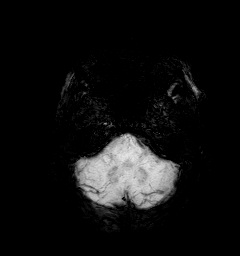
[im 30/45]
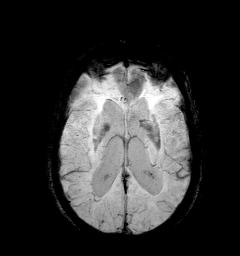
[im 45/45]
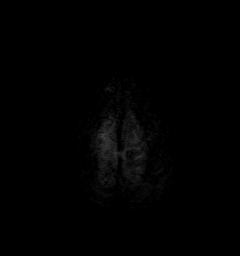

[Series 9: swi_images · axial · 3.0mm · 0.90mm/px · z∈[-38,+105]mm · 4 of 52 slices shown]
[im 1/52]
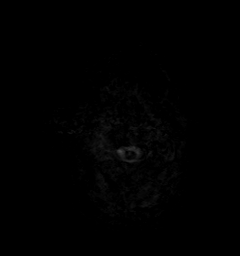
[im 18/52]
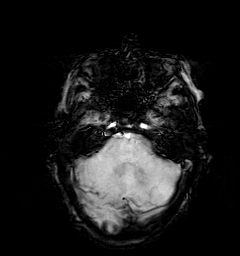
[im 35/52]
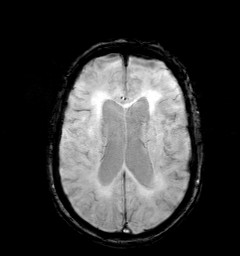
[im 52/52]
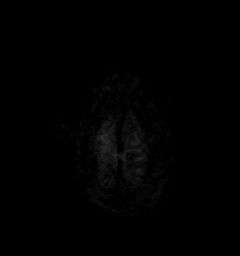

[Series 10: FLAIR · axial · 3.0mm · 0.43mm/px · z∈[-36,+100]mm · 3 of 38 slices shown]
[im 1/38]
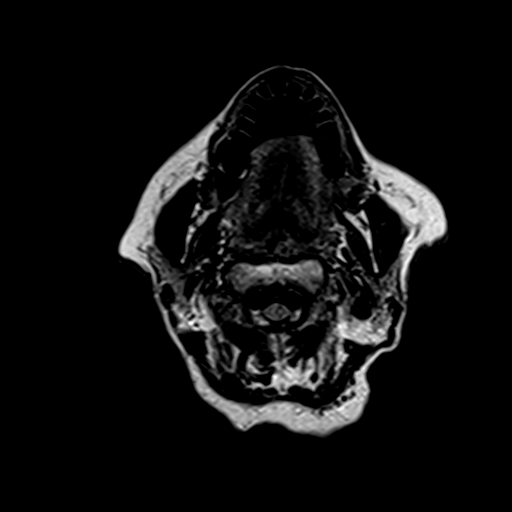
[im 19/38]
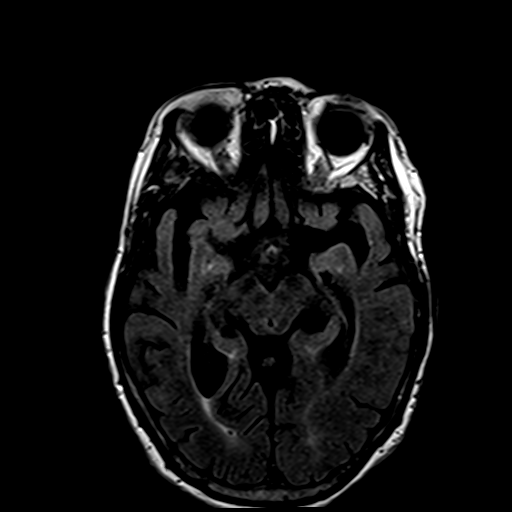
[im 38/38]
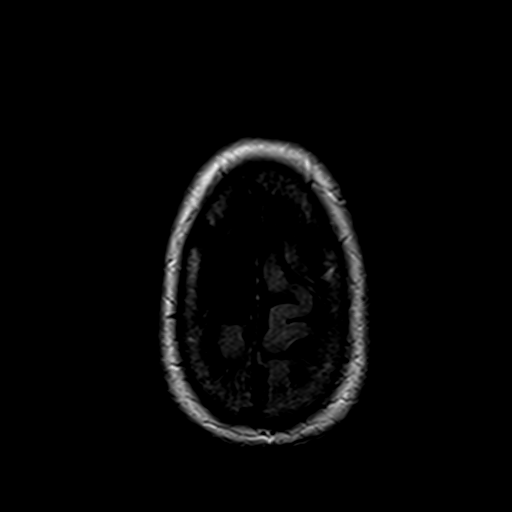

[Series 11: T1 · axial · 1.0mm · 0.72mm/px · z∈[-32,+101]mm · 12 of 144 slices shown (2 of 2)]
[im 1/144]
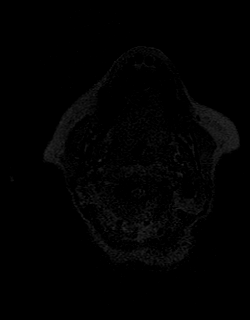
[im 14/144]
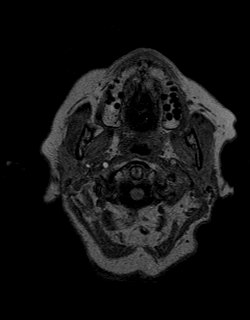
[im 27/144]
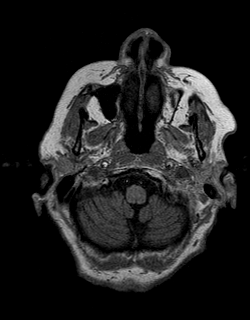
[im 40/144]
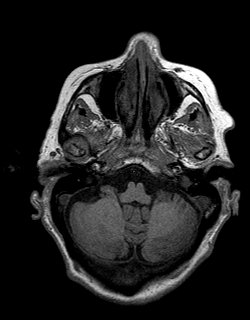
[im 53/144]
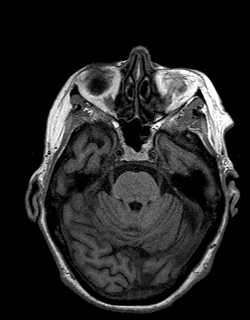
[im 66/144]
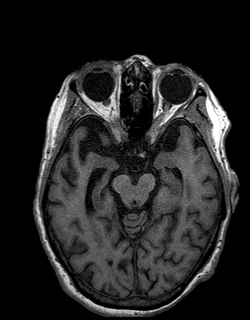
[im 79/144]
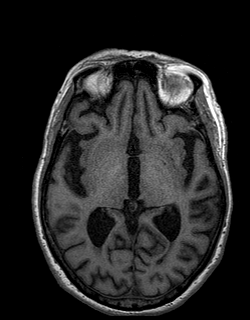
[im 92/144]
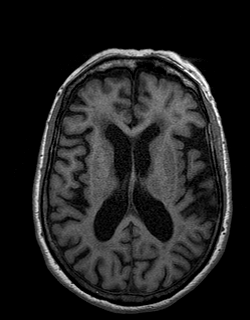
[im 105/144]
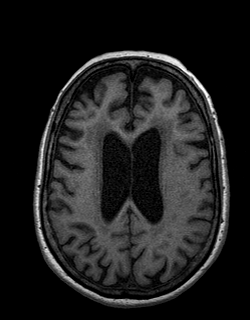
[im 118/144]
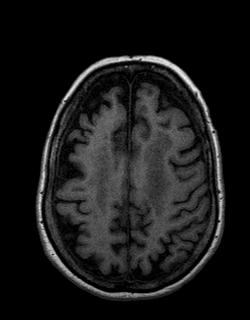
[im 131/144]
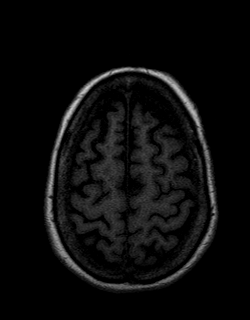
[im 144/144]
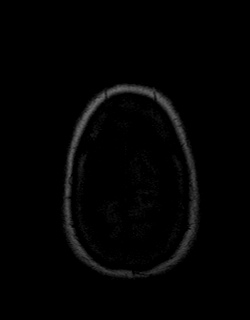

[Series 12: T2 · coronal · 5.0mm · 0.43mm/px · 2 of 28 slices shown (2 of 2)]
[im 1/28]
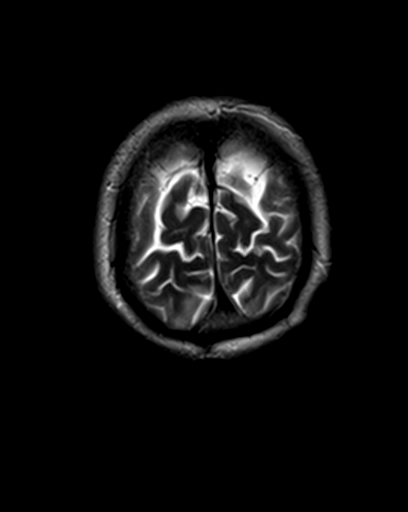
[im 28/28]
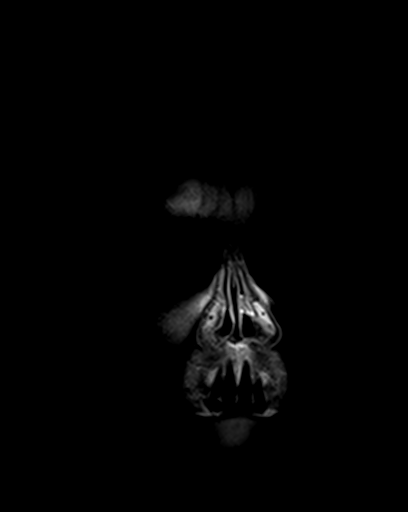

[48 of 48 positions shown; findings below may reference images not displayed]

FINDINGS: Brain:

Moderate cerebral atrophy without appreciable lobar predominance.
Comparatively mild cerebellar atrophy.

Moderate patchy and confluent T2 FLAIR hyperintense signal
abnormality within the cerebral white matter, nonspecific but
compatible with chronic small vessel ischemic disease.

Asymmetric CSF intensity prominence overlying the posterior right
frontal lobe, measuring 3.1 x 1.5 cm in transaxial dimensions,
suspicious for the presence of a small arachnoid cyst at this site.

There is no acute intracranial hemorrhage.

No demarcated cortical infarct.

No extra-axial fluid collection.

No evidence of an intracranial mass.

No midline shift.

Vascular: Maintained flow voids within the proximal large arterial
vessels.

Skull and upper cervical spine: No focal suspicious marrow lesion.
Incompletely assessed cervical spondylosis.

Sinuses/Orbits: Visualized orbits show no acute finding. Bilateral
lens replacements. Mild mucosal thickening within the bilateral
ethmoid and left maxillary sinuses.

Other: Trace fluid within the bilateral mastoid air cells.
IMPRESSION: No evidence of acute intracranial abnormality.

Moderate chronic small vessel ischemic changes within the cerebral
white matter.

Moderate generalized cerebral atrophy. Comparatively mild cerebellar
atrophy.

Suspected small arachnoid cyst overlying the posterior right frontal
lobe.

Mild mucosal thickening within the bilateral ethmoid and left
maxillary sinuses.

Trace fluid within the bilateral mastoid air cells.

## 2022-05-08 DIAGNOSIS — Z974 Presence of external hearing-aid: Secondary | ICD-10-CM | POA: Diagnosis not present

## 2022-05-08 DIAGNOSIS — H6123 Impacted cerumen, bilateral: Secondary | ICD-10-CM | POA: Diagnosis not present

## 2022-05-13 ENCOUNTER — Ambulatory Visit: Payer: Medicare Other | Admitting: Adult Health

## 2022-05-20 ENCOUNTER — Ambulatory Visit (INDEPENDENT_AMBULATORY_CARE_PROVIDER_SITE_OTHER): Payer: Medicare Other | Admitting: Adult Health

## 2022-05-20 ENCOUNTER — Encounter: Payer: Self-pay | Admitting: Adult Health

## 2022-05-20 DIAGNOSIS — F03B0 Unspecified dementia, moderate, without behavioral disturbance, psychotic disturbance, mood disturbance, and anxiety: Secondary | ICD-10-CM | POA: Diagnosis not present

## 2022-05-20 MED ORDER — DONEPEZIL HCL 10 MG PO TABS: 10.0000 mg | ORAL_TABLET | Freq: Every day | ORAL | 3 refills | Status: DC

## 2022-05-20 NOTE — Progress Notes (Signed)
PATIENT: Erin Ryan DOB: 1925-03-30  REASON FOR VISIT: follow up HISTORY FROM: patient PRIMARY NEUROLOGIST: Dr. Jaynee Eagles  Chief Complaint  Patient presents with   Follow-up    Rm 29, daughter.  Doing ok, very slight worsening.      HISTORY OF PRESENT ILLNESS: Today 05/20/22: Ms. Erin Ryan is a 88 year old female with a history of memory disturbance.  She returns today for follow-up.  She Lives with her daughter. She is able to complete all ADLS independently. Daughter manages her finances.  Daughter fills her pillbox for her.  She is currently on Aricept 5 mg and daughter reports that she has been tolerating this well.  The only change that she has noticed is that if the daughter is going out of town and someone out stays with the patient she gets upset.  Otherwise the patient has remained relatively stable.  Her sister with Alzheimer's passed away several months ago.  HISTORY   Erin Ryan is a 88 y.o. female here as requested by Harlan Stains, MD for memory loss and small vessel disease on MRI.  She has a past medical history of asthma, hyperlipidemia, hypertension with chronic kidney disease, osteoporosis, anxiety, COPD, frailty, acquired hypothyroidism, lumbago with sciatica on the right side, cognitive decline, compression fracture of L3, decreased appetite, bilateral hearing loss, memory difficulty, glaucoma, scoliosis, questionable vascular dementia due to MRI on 09/10/2021.  I reviewed Dr. Orest Dikes notes: Daughter reports that patient has had more trouble with her memory, she has had a recent outbreak of shingles, daughter says that she has days that are good and some days that are better, she gets up several times to urinate, appetite is decreased, there is anxiety but stable, however she no longer enjoys watching TV or reading.  Daughter worries that her memory loss is worsened, she does have significant hearing difficulties, her level of attention seems to vary significantly from  day-to-day, daughter feels like anxiety is well controlled but she does seem to be less engaged with life than previously, they switched from paroxetine to mirtazapine to see if that helps with her appetite.  Hearing aids have not been working as well.  More decline in the last several months.  No wandering at night.   Memory loss started about  ayear ago, her "phrasing was off", not being able to express herself as well, short-term memory loss, repeating the same questions 15 minutes later, saying the same things, she can't balance checkbook anymore, lives with daughter and she takes care of everything business wise, she has difficulty understanding letters like for medicare, very different prior baseline where she of course completely independent, she does not drive anymore, hasn't driven since covid and started worrying is she could, she can fix coffee but can't cook, she can't run the dishwasher, can dress herself, she does not like to take a bath daughter has to encourage her to complete hygiene, no falls, less interest in doing things, no hallucinations, no delusions, no changes in personality but she is a little more irritable about certain things, poor awareness into her memory loss, her sister is in the late stages of dementia. She is more childish.    Reviewed notes, labs and imaging from outside physicians, which showed:   I reviewed labs, recent urinalysis was negative for UTI.  Labs collected June 13, 2021 showed a vitamin D level of 76, TSH of 0.5, CMP with BUN 26 and creatinine 1.14 EGFR 45 otherwise normal, B12 875, magnesium normal   MRI  of the brain 09/02/2021: IMPRESSION: personally reviewed and agreed and reviewed images also with patient and  No evidence of acute intracranial abnormality. Moderate chronic small vessel ischemic changes within the cerebral white matter.Moderate generalized cerebral atrophy. Comparatively mild cerebellar atrophy.Suspected small arachnoid cyst  overlying the posterior right frontal lobe. Mild mucosal thickening within the bilateral ethmoid and left maxillary sinuses.Trace fluid within the bilateral mastoid air cells.  REVIEW OF SYSTEMS: Out of a complete 14 system review of symptoms, the patient complains only of the following symptoms, and all other reviewed systems are negative.  ALLERGIES: Allergies  Allergen Reactions   Hydrochlorothiazide     hyponatremia   Lyrica [Pregabalin] Other (See Comments)    Severe confusion   Diltiazem Hcl Er Rash   Short Ragweed Pollen Ext     Other reaction(s): Other    HOME MEDICATIONS: Outpatient Medications Prior to Visit  Medication Sig Dispense Refill   amLODipine (NORVASC) 2.5 MG tablet Take 2.5 mg by mouth daily.     aspirin 81 MG tablet Take 81 mg by mouth daily.     atorvastatin (LIPITOR) 40 MG tablet Take 40 mg by mouth daily.     Calcium Citrate-Vitamin D (CALCIUM CITRATE + PO) Take 1 tablet 2 (two) times daily by mouth.     denosumab (PROLIA) 60 MG/ML SOSY injection See admin instructions.     donepezil (ARICEPT) 5 MG tablet TAKE 1 TABLET BY MOUTH EVERYDAY AT BEDTIME 30 tablet 5   DULoxetine (CYMBALTA) 30 MG capsule Take 30 mg by mouth daily.     gabapentin (NEURONTIN) 100 MG capsule Take 100 mg by mouth at bedtime.     levothyroxine (SYNTHROID) 125 MCG tablet daw-1 1 tablet on an empty stomach in the morning     metoprolol tartrate (LOPRESSOR) 25 MG tablet TAKE 1 TABLET BY MOUTH TWICE A DAY 180 tablet 3   mirtazapine (REMERON) 15 MG tablet Take 15 mg by mouth at bedtime.     omeprazole (PRILOSEC) 40 MG capsule Take 1 capsule (40 mg total) by mouth 2 (two) times daily before a meal. 180 capsule 3   furosemide (LASIX) 20 MG tablet Take 1 tablet (20 mg total) by mouth every other day. 90 tablet 2   No facility-administered medications prior to visit.    PAST MEDICAL HISTORY: Past Medical History:  Diagnosis Date   Anxiety    Arthritis    COVID-19    04/2021   GERD  (gastroesophageal reflux disease)    Hearing loss    has hearing aids   High cholesterol    Hypertension    Hypothyroid    Osteoporosis    Scoliosis    Thyroid disease     PAST SURGICAL HISTORY: Past Surgical History:  Procedure Laterality Date   CATARACT EXTRACTION     cysts removed from bilateral breasts     THYROIDECTOMY      FAMILY HISTORY: Family History  Problem Relation Age of Onset   Heart disease Mother 40       Heart attack   Bladder Cancer Mother    Heart disease Father    Asthma Father    Heart attack Brother        Age undetermined    SOCIAL HISTORY: Social History   Socioeconomic History   Marital status: Widowed    Spouse name: Not on file   Number of children: 1   Years of education: Not on file   Highest education level: Not on file  Occupational History   Occupation: Retired    Fish farm manager: RETIRED    Comment: Scientist, research (physical sciences)  Tobacco Use   Smoking status: Never   Smokeless tobacco: Never  Vaping Use   Vaping Use: Never used  Substance and Sexual Activity   Alcohol use: No   Drug use: No   Sexual activity: Not on file  Other Topics Concern   Not on file  Social History Narrative   Lives with daughter.    Has 1 daughter, [name], and 2 grandkids   Social Determinants of Radio broadcast assistant Strain: Not on file  Food Insecurity: Not on file  Transportation Needs: Not on file  Physical Activity: Not on file  Stress: Not on file  Social Connections: Not on file  Intimate Partner Violence: Not on file      PHYSICAL EXAM  Vitals:   05/20/22 0922  BP: (!) 155/72  Pulse: 76  Weight: 120 lb (54.4 kg)  Height: 5' 2"  (1.575 m)   Body mass index is 21.95 kg/m.     05/20/2022    9:33 AM 11/05/2021   10:00 AM  MMSE - Mini Mental State Exam  Orientation to time 4 4  Orientation to time comments  season: fall  Orientation to Place 5 2  Registration 3 3  Attention/ Calculation 0 0  Recall 1 1  Language- name 2 objects 2 2   Language- repeat 1 0  Language- follow 3 step command 2 2  Language- read & follow direction 1 1  Write a sentence 1 1  Write a sentence-comments  "I want to see [place]."  Copy design 1 0  Total score 21 16     Generalized: Well developed, in no acute distress   Neurological examination  Mentation: Alert oriented to time, place, history taking. Follows all commands speech and language fluent Cranial nerve II-XII: Pupils were equal round reactive to light. Extraocular movements were full, visual field were full on confrontational test. Facial sensation and strength were normal. Head turning and shoulder shrug  were normal and symmetric. Motor: The motor testing reveals 5 over 5 strength of all 4 extremities. Good symmetric motor tone is noted throughout.  Sensory: Sensory testing is intact to soft touch on all 4 extremities. No evidence of extinction is noted.  Coordination: Cerebellar testing reveals good finger-nose-finger and heel-to-shin bilaterally.  Some difficulty with understanding directions to complete this task Gait and station: Gait is normal. .    DIAGNOSTIC DATA (LABS, IMAGING, TESTING) - I reviewed patient records, labs, notes, testing and imaging myself where available.  Lab Results  Component Value Date   WBC 7.1 11/12/2021   HGB 13.3 11/12/2021   HCT 40.3 11/12/2021   MCV 89.0 11/12/2021   PLT 292 11/12/2021      Component Value Date/Time   NA 135 11/12/2021 1132   NA 138 07/22/2021 1413   K 4.3 11/12/2021 1132   CL 99 11/12/2021 1132   CO2 28 11/12/2021 1132   GLUCOSE 120 (H) 11/12/2021 1132   BUN 28 (H) 11/12/2021 1132   BUN 26 07/22/2021 1413   CREATININE 1.00 11/12/2021 1132   CALCIUM 9.0 11/12/2021 1132   PROT 7.1 11/12/2021 1132   ALBUMIN 3.6 11/12/2021 1132   AST 31 11/12/2021 1132   ALT 22 11/12/2021 1132   ALKPHOS 74 11/12/2021 1132   BILITOT 0.6 11/12/2021 1132   GFRNONAA 52 (L) 11/12/2021 1132   GFRAA 61 11/15/2007 1528   Lab Results  Component Value Date   TSH 4.48 05/14/2015      ASSESSMENT AND PLAN 88 y.o. year old female  has a past medical history of Anxiety, Arthritis, COVID-19, GERD (gastroesophageal reflux disease), Hearing loss, High cholesterol, Hypertension, Hypothyroid, Osteoporosis, Scoliosis, and Thyroid disease. here with:   Memory disturbance  -Increase Aricept 10 mg daily  - Memory score is stable -Advised if symptoms worsen or she develops new symptoms she should let us know -Follow-up in 6 months or sooner if needed      Ward Givens, MSN, NP-C 05/20/2022, 9:27 AM [facility] [address] [place], [place] [address] [phone]

## 2022-05-20 NOTE — Patient Instructions (Signed)
Your Plan:  Increase Aricept 10 mg at bedtime If your symptoms worsen or you develop new symptoms please let us know.   Thank you for coming to see [place] at [facility]. I hope we have been able to provide you high quality care today.  You may receive a patient satisfaction survey over the next few weeks. We would appreciate your feedback and comments so that we may continue to improve ourselves and the health of our patients.

## 2022-06-12 ENCOUNTER — Telehealth: Payer: Self-pay | Admitting: *Deleted

## 2022-06-12 NOTE — Chronic Care Management (AMB) (Signed)
  Care Coordination  Outreach Note  06/12/2022 Name: Shaquana Buel MRN: [id number] DOB: May 14, 1925   Care Coordination Outreach Attempts: An unsuccessful telephone outreach was attempted today to offer the patient information about available care coordination services as a benefit of their health plan.   Follow Up Plan:  Additional outreach attempts will be made to offer the patient care coordination information and services.   Encounter Outcome:  No Answer  [place]  Direct Dial: [phone]

## 2022-06-16 NOTE — Chronic Care Management (AMB) (Signed)
  Care Coordination  Outreach Note  06/16/2022 Name: Erin Ryan MRN: [id number] DOB: 12-06-1924   Care Coordination Outreach Attempts: A second unsuccessful outreach was attempted today to offer the patient with information about available care coordination services as a benefit of their health plan.     Follow Up Plan:  Additional outreach attempts will be made to offer the patient care coordination information and services.   Encounter Outcome:  No Answer  [place]  Direct Dial: [phone]

## 2022-06-23 NOTE — Chronic Care Management (AMB) (Signed)
  Care Coordination  Outreach Note  06/23/2022 Name: Erin Ryan MRN: [id number] DOB: 12/11/1924   Care Coordination Outreach Attempts: A third unsuccessful outreach was attempted today to offer the patient with information about available care coordination services as a benefit of their health plan.   Follow Up Plan:  No further outreach attempts will be made at this time. We have been unable to contact the patient to offer or enroll patient in care coordination services  Encounter Outcome:  No Answer  [place]: [phone]

## 2022-06-25 ENCOUNTER — Ambulatory Visit: Payer: Medicare Other | Admitting: Adult Health

## 2022-06-25 DIAGNOSIS — H903 Sensorineural hearing loss, bilateral: Secondary | ICD-10-CM | POA: Diagnosis not present

## 2022-07-14 DIAGNOSIS — N183 Chronic kidney disease, stage 3 unspecified: Secondary | ICD-10-CM | POA: Diagnosis not present

## 2022-07-14 DIAGNOSIS — E785 Hyperlipidemia, unspecified: Secondary | ICD-10-CM | POA: Diagnosis not present

## 2022-07-14 DIAGNOSIS — I129 Hypertensive chronic kidney disease with stage 1 through stage 4 chronic kidney disease, or unspecified chronic kidney disease: Secondary | ICD-10-CM | POA: Diagnosis not present

## 2022-07-31 DIAGNOSIS — M25562 Pain in left knee: Secondary | ICD-10-CM | POA: Diagnosis not present

## 2022-07-31 DIAGNOSIS — M25561 Pain in right knee: Secondary | ICD-10-CM | POA: Diagnosis not present

## 2022-07-31 DIAGNOSIS — M533 Sacrococcygeal disorders, not elsewhere classified: Secondary | ICD-10-CM | POA: Diagnosis not present

## 2022-08-06 DIAGNOSIS — M533 Sacrococcygeal disorders, not elsewhere classified: Secondary | ICD-10-CM | POA: Diagnosis not present

## 2022-08-10 ENCOUNTER — Encounter (HOSPITAL_BASED_OUTPATIENT_CLINIC_OR_DEPARTMENT_OTHER): Payer: Self-pay | Admitting: Emergency Medicine

## 2022-08-10 ENCOUNTER — Encounter (HOSPITAL_COMMUNITY): Payer: Self-pay

## 2022-08-10 ENCOUNTER — Emergency Department (HOSPITAL_BASED_OUTPATIENT_CLINIC_OR_DEPARTMENT_OTHER): Payer: Medicare Other

## 2022-08-10 ENCOUNTER — Other Ambulatory Visit: Payer: Self-pay

## 2022-08-10 ENCOUNTER — Inpatient Hospital Stay (HOSPITAL_BASED_OUTPATIENT_CLINIC_OR_DEPARTMENT_OTHER)
Admission: EM | Admit: 2022-08-10 | Discharge: 2022-08-20 | DRG: 175 | Disposition: A | Discharge status: Skilled Nursing Facility | Payer: Medicare Other | Attending: Internal Medicine | Admitting: Internal Medicine

## 2022-08-10 DIAGNOSIS — R9431 Abnormal electrocardiogram [ECG] [EKG]: Secondary | ICD-10-CM | POA: Diagnosis not present

## 2022-08-10 DIAGNOSIS — M2550 Pain in unspecified joint: Secondary | ICD-10-CM | POA: Diagnosis not present

## 2022-08-10 DIAGNOSIS — R531 Weakness: Secondary | ICD-10-CM | POA: Diagnosis not present

## 2022-08-10 DIAGNOSIS — J189 Pneumonia, unspecified organism: Secondary | ICD-10-CM | POA: Diagnosis present

## 2022-08-10 DIAGNOSIS — I2699 Other pulmonary embolism without acute cor pulmonale: Secondary | ICD-10-CM | POA: Diagnosis present

## 2022-08-10 DIAGNOSIS — K219 Gastro-esophageal reflux disease without esophagitis: Secondary | ICD-10-CM | POA: Diagnosis not present

## 2022-08-10 DIAGNOSIS — J440 Chronic obstructive pulmonary disease with acute lower respiratory infection: Secondary | ICD-10-CM | POA: Diagnosis present

## 2022-08-10 DIAGNOSIS — E890 Postprocedural hypothyroidism: Secondary | ICD-10-CM | POA: Diagnosis present

## 2022-08-10 DIAGNOSIS — F03B0 Unspecified dementia, moderate, without behavioral disturbance, psychotic disturbance, mood disturbance, and anxiety: Secondary | ICD-10-CM | POA: Diagnosis not present

## 2022-08-10 DIAGNOSIS — I130 Hypertensive heart and chronic kidney disease with heart failure and stage 1 through stage 4 chronic kidney disease, or unspecified chronic kidney disease: Secondary | ICD-10-CM | POA: Diagnosis present

## 2022-08-10 DIAGNOSIS — I480 Paroxysmal atrial fibrillation: Secondary | ICD-10-CM

## 2022-08-10 DIAGNOSIS — Z6823 Body mass index (BMI) 23.0-23.9, adult: Secondary | ICD-10-CM

## 2022-08-10 DIAGNOSIS — Z8616 Personal history of COVID-19: Secondary | ICD-10-CM | POA: Diagnosis not present

## 2022-08-10 DIAGNOSIS — I4891 Unspecified atrial fibrillation: Secondary | ICD-10-CM

## 2022-08-10 DIAGNOSIS — Z974 Presence of external hearing-aid: Secondary | ICD-10-CM

## 2022-08-10 DIAGNOSIS — H9193 Unspecified hearing loss, bilateral: Secondary | ICD-10-CM | POA: Diagnosis not present

## 2022-08-10 DIAGNOSIS — G9341 Metabolic encephalopathy: Secondary | ICD-10-CM | POA: Diagnosis present

## 2022-08-10 DIAGNOSIS — R4182 Altered mental status, unspecified: Principal | ICD-10-CM

## 2022-08-10 DIAGNOSIS — R5381 Other malaise: Secondary | ICD-10-CM | POA: Diagnosis not present

## 2022-08-10 DIAGNOSIS — E1122 Type 2 diabetes mellitus with diabetic chronic kidney disease: Secondary | ICD-10-CM | POA: Diagnosis present

## 2022-08-10 DIAGNOSIS — D631 Anemia in chronic kidney disease: Secondary | ICD-10-CM | POA: Diagnosis present

## 2022-08-10 DIAGNOSIS — B952 Enterococcus as the cause of diseases classified elsewhere: Secondary | ICD-10-CM | POA: Insufficient documentation

## 2022-08-10 DIAGNOSIS — Z1152 Encounter for screening for COVID-19: Secondary | ICD-10-CM | POA: Diagnosis not present

## 2022-08-10 DIAGNOSIS — I3481 Nonrheumatic mitral (valve) annulus calcification: Secondary | ICD-10-CM | POA: Diagnosis present

## 2022-08-10 DIAGNOSIS — I2609 Other pulmonary embolism with acute cor pulmonale: Secondary | ICD-10-CM | POA: Diagnosis not present

## 2022-08-10 DIAGNOSIS — R0602 Shortness of breath: Secondary | ICD-10-CM | POA: Diagnosis not present

## 2022-08-10 DIAGNOSIS — E43 Unspecified severe protein-calorie malnutrition: Secondary | ICD-10-CM | POA: Insufficient documentation

## 2022-08-10 DIAGNOSIS — N183 Chronic kidney disease, stage 3 unspecified: Secondary | ICD-10-CM | POA: Diagnosis present

## 2022-08-10 DIAGNOSIS — M1990 Unspecified osteoarthritis, unspecified site: Secondary | ICD-10-CM | POA: Diagnosis present

## 2022-08-10 DIAGNOSIS — I5033 Acute on chronic diastolic (congestive) heart failure: Secondary | ICD-10-CM | POA: Diagnosis not present

## 2022-08-10 DIAGNOSIS — Z86711 Personal history of pulmonary embolism: Secondary | ICD-10-CM | POA: Diagnosis present

## 2022-08-10 DIAGNOSIS — Z7401 Bed confinement status: Secondary | ICD-10-CM | POA: Diagnosis not present

## 2022-08-10 DIAGNOSIS — J392 Other diseases of pharynx: Secondary | ICD-10-CM | POA: Diagnosis present

## 2022-08-10 DIAGNOSIS — M810 Age-related osteoporosis without current pathological fracture: Secondary | ICD-10-CM | POA: Diagnosis present

## 2022-08-10 DIAGNOSIS — Z8249 Family history of ischemic heart disease and other diseases of the circulatory system: Secondary | ICD-10-CM

## 2022-08-10 DIAGNOSIS — Z7982 Long term (current) use of aspirin: Secondary | ICD-10-CM

## 2022-08-10 DIAGNOSIS — N1832 Chronic kidney disease, stage 3b: Secondary | ICD-10-CM | POA: Diagnosis present

## 2022-08-10 DIAGNOSIS — I10 Essential (primary) hypertension: Secondary | ICD-10-CM | POA: Diagnosis present

## 2022-08-10 DIAGNOSIS — Z8701 Personal history of pneumonia (recurrent): Secondary | ICD-10-CM

## 2022-08-10 DIAGNOSIS — R339 Retention of urine, unspecified: Secondary | ICD-10-CM | POA: Diagnosis not present

## 2022-08-10 DIAGNOSIS — N179 Acute kidney failure, unspecified: Secondary | ICD-10-CM | POA: Diagnosis not present

## 2022-08-10 DIAGNOSIS — E039 Hypothyroidism, unspecified: Secondary | ICD-10-CM | POA: Diagnosis not present

## 2022-08-10 DIAGNOSIS — Z79890 Hormone replacement therapy (postmenopausal): Secondary | ICD-10-CM

## 2022-08-10 DIAGNOSIS — N1831 Chronic kidney disease, stage 3a: Secondary | ICD-10-CM | POA: Diagnosis not present

## 2022-08-10 DIAGNOSIS — J9601 Acute respiratory failure with hypoxia: Secondary | ICD-10-CM | POA: Diagnosis not present

## 2022-08-10 DIAGNOSIS — J449 Chronic obstructive pulmonary disease, unspecified: Secondary | ICD-10-CM | POA: Diagnosis not present

## 2022-08-10 DIAGNOSIS — F01B4 Vascular dementia, moderate, with anxiety: Secondary | ICD-10-CM | POA: Diagnosis present

## 2022-08-10 DIAGNOSIS — E7800 Pure hypercholesterolemia, unspecified: Secondary | ICD-10-CM | POA: Diagnosis present

## 2022-08-10 DIAGNOSIS — N390 Urinary tract infection, site not specified: Secondary | ICD-10-CM | POA: Diagnosis present

## 2022-08-10 DIAGNOSIS — E785 Hyperlipidemia, unspecified: Secondary | ICD-10-CM | POA: Diagnosis not present

## 2022-08-10 DIAGNOSIS — Z888 Allergy status to other drugs, medicaments and biological substances status: Secondary | ICD-10-CM

## 2022-08-10 DIAGNOSIS — R4181 Age-related cognitive decline: Secondary | ICD-10-CM | POA: Diagnosis not present

## 2022-08-10 DIAGNOSIS — Z79899 Other long term (current) drug therapy: Secondary | ICD-10-CM

## 2022-08-10 DIAGNOSIS — I6782 Cerebral ischemia: Secondary | ICD-10-CM | POA: Diagnosis not present

## 2022-08-10 DIAGNOSIS — R0902 Hypoxemia: Secondary | ICD-10-CM | POA: Diagnosis not present

## 2022-08-10 LAB — MRSA NEXT GEN BY PCR, NASAL: MRSA by PCR Next Gen: NOT DETECTED

## 2022-08-10 LAB — COMPREHENSIVE METABOLIC PANEL
ALT: 18 U/L (ref 0–44)
AST: 28 U/L (ref 15–41)
Albumin: 2.8 g/dL — ABNORMAL LOW (ref 3.5–5.0)
Alkaline Phosphatase: 64 U/L (ref 38–126)
Anion gap: 9 (ref 5–15)
BUN: 51 mg/dL — ABNORMAL HIGH (ref 8–23)
CO2: 24 mmol/L (ref 22–32)
Calcium: 7.9 mg/dL — ABNORMAL LOW (ref 8.9–10.3)
Chloride: 102 mmol/L (ref 98–111)
Creatinine, Ser: 1.22 mg/dL — ABNORMAL HIGH (ref 0.44–1.00)
GFR, Estimated: 41 mL/min — ABNORMAL LOW (ref >60)
Glucose, Bld: 139 mg/dL — ABNORMAL HIGH (ref 70–99)
Potassium: 4 mmol/L (ref 3.5–5.1)
Sodium: 135 mmol/L (ref 135–145)
Total Bilirubin: 0.4 mg/dL (ref 0.3–1.2)
Total Protein: 6.5 g/dL (ref 6.5–8.1)

## 2022-08-10 LAB — URINALYSIS, ROUTINE W REFLEX MICROSCOPIC
Bilirubin Urine: NEGATIVE
Glucose, UA: NEGATIVE mg/dL
Ketones, ur: NEGATIVE mg/dL
Nitrite: NEGATIVE
Protein, ur: 30 mg/dL — AB
Specific Gravity, Urine: 1.015 (ref 1.005–1.030)
pH: 6.5 (ref 5.0–8.0)

## 2022-08-10 LAB — CBC WITH DIFFERENTIAL/PLATELET
Abs Immature Granulocytes: 0.09 10*3/uL — ABNORMAL HIGH (ref 0.00–0.07)
Basophils Absolute: 0 10*3/uL (ref 0.0–0.1)
Basophils Relative: 0 %
Eosinophils Absolute: 0 10*3/uL (ref 0.0–0.5)
Eosinophils Relative: 0 %
HCT: 37 % (ref 36.0–46.0)
Hemoglobin: 11.8 g/dL — ABNORMAL LOW (ref 12.0–15.0)
Immature Granulocytes: 1 %
Lymphocytes Relative: 8 %
Lymphs Abs: 1 10*3/uL (ref 0.7–4.0)
MCH: 28.4 pg (ref 26.0–34.0)
MCHC: 31.9 g/dL (ref 30.0–36.0)
MCV: 89.2 fL (ref 80.0–100.0)
Monocytes Absolute: 1.5 10*3/uL — ABNORMAL HIGH (ref 0.1–1.0)
Monocytes Relative: 12 %
Neutro Abs: 9.8 10*3/uL — ABNORMAL HIGH (ref 1.7–7.7)
Neutrophils Relative %: 79 %
Platelets: 225 10*3/uL (ref 150–400)
RBC: 4.15 MIL/uL (ref 3.87–5.11)
RDW: 14.2 % (ref 11.5–15.5)
WBC: 12.5 10*3/uL — ABNORMAL HIGH (ref 4.0–10.5)
nRBC: 0 % (ref 0.0–0.2)

## 2022-08-10 LAB — LACTIC ACID, PLASMA: Lactic Acid, Venous: 1.7 mmol/L (ref 0.5–1.9)

## 2022-08-10 LAB — URINALYSIS, MICROSCOPIC (REFLEX): WBC, UA: >50 WBC/hpf (ref 0–5)

## 2022-08-10 LAB — RESP PANEL BY RT-PCR (FLU A&B, COVID) ARPGX2
Influenza A by PCR: NEGATIVE
Influenza B by PCR: NEGATIVE
SARS Coronavirus 2 by RT PCR: NEGATIVE

## 2022-08-10 LAB — HEPARIN LEVEL (UNFRACTIONATED): Heparin Unfractionated: >1.1 IU/mL — ABNORMAL HIGH (ref 0.30–0.70)

## 2022-08-10 LAB — PROTIME-INR
INR: 1.1 (ref 0.8–1.2)
Prothrombin Time: 13.9 seconds (ref 11.4–15.2)

## 2022-08-10 LAB — LIPASE, BLOOD: Lipase: 37 U/L (ref 11–51)

## 2022-08-10 LAB — APTT
aPTT: 30 seconds (ref 24–36)
aPTT: >200 seconds (ref 24–36)

## 2022-08-10 LAB — TROPONIN I (HIGH SENSITIVITY)
Troponin I (High Sensitivity): 16 ng/L (ref <18)
Troponin I (High Sensitivity): 13 ng/L (ref <18)

## 2022-08-10 LAB — BRAIN NATRIURETIC PEPTIDE: B Natriuretic Peptide: 523.6 pg/mL — ABNORMAL HIGH (ref 0.0–100.0)

## 2022-08-10 MED ORDER — ACETAMINOPHEN 650 MG RE SUPP: 650.0000 mg | Freq: Four times a day (QID) | RECTAL | Status: DC | PRN

## 2022-08-10 MED ORDER — GABAPENTIN 100 MG PO CAPS
100.0000 mg | ORAL_CAPSULE | Freq: Every day | ORAL | Status: DC
  Administered 2022-08-10 – 2022-08-19 (×10): 100 mg via ORAL
  Filled 2022-08-10 (×10): qty 1

## 2022-08-10 MED ORDER — PANTOPRAZOLE SODIUM 40 MG PO TBEC
40.0000 mg | DELAYED_RELEASE_TABLET | Freq: Every day | ORAL | Status: DC
  Administered 2022-08-10 – 2022-08-20 (×11): 40 mg via ORAL
  Filled 2022-08-10 (×11): qty 1

## 2022-08-10 MED ORDER — HEPARIN BOLUS VIA INFUSION
3000.0000 [IU] | Freq: Once | INTRAVENOUS | Status: AC
  Administered 2022-08-10: 3000 [IU] via INTRAVENOUS

## 2022-08-10 MED ORDER — DONEPEZIL HCL 10 MG PO TABS
10.0000 mg | ORAL_TABLET | Freq: Every day | ORAL | Status: DC
  Administered 2022-08-10 – 2022-08-19 (×10): 10 mg via ORAL
  Filled 2022-08-10 (×10): qty 1

## 2022-08-10 MED ORDER — TRAZODONE HCL 50 MG PO TABS
50.0000 mg | ORAL_TABLET | Freq: Every evening | ORAL | Status: DC | PRN
  Administered 2022-08-11 – 2022-08-19 (×9): 50 mg via ORAL
  Filled 2022-08-10 (×9): qty 1

## 2022-08-10 MED ORDER — LACTATED RINGERS IV BOLUS
500.0000 mL | Freq: Once | INTRAVENOUS | Status: AC
  Administered 2022-08-10: 500 mL via INTRAVENOUS

## 2022-08-10 MED ORDER — OXYCODONE HCL 5 MG PO TABS
5.0000 mg | ORAL_TABLET | ORAL | Status: DC | PRN
  Administered 2022-08-10 – 2022-08-12 (×3): 5 mg via ORAL
  Filled 2022-08-10 (×3): qty 1

## 2022-08-10 MED ORDER — IOHEXOL 350 MG/ML SOLN
60.0000 mL | Freq: Once | INTRAVENOUS | Status: AC | PRN
  Administered 2022-08-10: 60 mL via INTRAVENOUS

## 2022-08-10 MED ORDER — METOPROLOL TARTRATE 5 MG/5ML IV SOLN
5.0000 mg | Freq: Once | INTRAVENOUS | Status: AC
  Administered 2022-08-10: 5 mg via INTRAVENOUS
  Filled 2022-08-10: qty 5

## 2022-08-10 MED ORDER — METOPROLOL TARTRATE 25 MG PO TABS
25.0000 mg | ORAL_TABLET | Freq: Two times a day (BID) | ORAL | Status: DC
  Administered 2022-08-10 – 2022-08-11 (×2): 25 mg via ORAL
  Filled 2022-08-10 (×2): qty 1

## 2022-08-10 MED ORDER — ORAL CARE MOUTH RINSE: 15.0000 mL | OROMUCOSAL | Status: DC | PRN

## 2022-08-10 MED ORDER — POLYETHYLENE GLYCOL 3350 17 G PO PACK: 17.0000 g | PACK | Freq: Every day | ORAL | Status: DC | PRN

## 2022-08-10 MED ORDER — ACETAMINOPHEN 325 MG PO TABS: 650.0000 mg | ORAL_TABLET | Freq: Four times a day (QID) | ORAL | Status: DC | PRN

## 2022-08-10 MED ORDER — SODIUM CHLORIDE 0.9 % IV SOLN: INTRAVENOUS | Status: DC

## 2022-08-10 MED ORDER — HYDRALAZINE HCL 20 MG/ML IJ SOLN: 10.0000 mg | Freq: Four times a day (QID) | INTRAMUSCULAR | Status: DC | PRN

## 2022-08-10 MED ORDER — ALBUTEROL SULFATE (2.5 MG/3ML) 0.083% IN NEBU
2.5000 mg | INHALATION_SOLUTION | Freq: Three times a day (TID) | RESPIRATORY_TRACT | Status: DC
  Administered 2022-08-11 (×3): 2.5 mg via RESPIRATORY_TRACT
  Filled 2022-08-10 (×3): qty 3

## 2022-08-10 MED ORDER — HEPARIN (PORCINE) 25000 UT/250ML-% IV SOLN
900.0000 [IU]/h | INTRAVENOUS | Status: DC
  Filled 2022-08-10: qty 250

## 2022-08-10 MED ORDER — SENNA 8.6 MG PO TABS
1.0000 | ORAL_TABLET | Freq: Two times a day (BID) | ORAL | Status: DC
  Administered 2022-08-10 – 2022-08-13 (×7): 8.6 mg via ORAL
  Filled 2022-08-10 (×7): qty 1

## 2022-08-10 MED ORDER — SODIUM CHLORIDE 0.9 % IV SOLN
1.0000 g | Freq: Once | INTRAVENOUS | Status: AC
  Administered 2022-08-10: 1 g via INTRAVENOUS
  Filled 2022-08-10: qty 10

## 2022-08-10 MED ORDER — DULOXETINE HCL 30 MG PO CPEP
30.0000 mg | ORAL_CAPSULE | Freq: Every day | ORAL | Status: DC
  Administered 2022-08-11 – 2022-08-20 (×10): 30 mg via ORAL
  Filled 2022-08-10 (×10): qty 1

## 2022-08-10 MED ORDER — ALBUTEROL SULFATE (2.5 MG/3ML) 0.083% IN NEBU
2.5000 mg | INHALATION_SOLUTION | Freq: Four times a day (QID) | RESPIRATORY_TRACT | Status: DC
  Administered 2022-08-10: 2.5 mg via RESPIRATORY_TRACT
  Filled 2022-08-10: qty 3

## 2022-08-10 MED ORDER — CHLORHEXIDINE GLUCONATE CLOTH 2 % EX PADS
6.0000 | MEDICATED_PAD | Freq: Every day | CUTANEOUS | Status: DC
  Administered 2022-08-10 – 2022-08-17 (×6): 6 via TOPICAL

## 2022-08-10 MED ORDER — HEPARIN (PORCINE) 25000 UT/250ML-% IV SOLN
1050.0000 [IU]/h | INTRAVENOUS | Status: DC
  Administered 2022-08-10: 1050 [IU]/h via INTRAVENOUS
  Filled 2022-08-10: qty 250

## 2022-08-10 MED ORDER — ASPIRIN 81 MG PO CHEW
324.0000 mg | CHEWABLE_TABLET | Freq: Once | ORAL | Status: AC
  Administered 2022-08-10: 324 mg via ORAL
  Filled 2022-08-10: qty 4

## 2022-08-10 MED ORDER — METOPROLOL TARTRATE 5 MG/5ML IV SOLN
5.0000 mg | INTRAVENOUS | Status: AC | PRN
  Administered 2022-08-10 (×2): 5 mg via INTRAVENOUS
  Filled 2022-08-10 (×2): qty 5

## 2022-08-10 MED ORDER — METOPROLOL TARTRATE 25 MG PO TABS: 25.0000 mg | ORAL_TABLET | Freq: Two times a day (BID) | ORAL | Status: DC

## 2022-08-10 MED ORDER — MIRTAZAPINE 15 MG PO TABS
15.0000 mg | ORAL_TABLET | Freq: Every day | ORAL | Status: DC
  Administered 2022-08-10 – 2022-08-19 (×10): 15 mg via ORAL
  Filled 2022-08-10 (×10): qty 1

## 2022-08-10 MED ORDER — NITROGLYCERIN 0.4 MG SL SUBL
0.4000 mg | SUBLINGUAL_TABLET | SUBLINGUAL | Status: DC | PRN
  Administered 2022-08-10 (×2): 0.4 mg via SUBLINGUAL
  Filled 2022-08-10 (×3): qty 1

## 2022-08-10 MED ORDER — ATORVASTATIN CALCIUM 40 MG PO TABS
40.0000 mg | ORAL_TABLET | Freq: Every day | ORAL | Status: DC
  Administered 2022-08-10 – 2022-08-19 (×10): 40 mg via ORAL
  Filled 2022-08-10 (×10): qty 1

## 2022-08-10 MED ORDER — HEPARIN BOLUS VIA INFUSION: 3500.0000 [IU] | Freq: Once | INTRAVENOUS | Status: DC

## 2022-08-10 MED ORDER — SODIUM CHLORIDE 0.9 % IV SOLN
1.0000 g | INTRAVENOUS | Status: DC
  Administered 2022-08-11: 1 g via INTRAVENOUS
  Filled 2022-08-10: qty 10

## 2022-08-10 NOTE — ED Notes (Signed)
Report given to [facility] with [facility]

## 2022-08-10 NOTE — Progress Notes (Signed)
ANTICOAGULATION CONSULT NOTE - Initial Consult  Pharmacy Consult for Heparin Indication: pulmonary embolus  Allergies  Allergen Reactions   Hydrochlorothiazide     hyponatremia   Lyrica [Pregabalin] Other (See Comments)    Severe confusion   Diltiazem Hcl Er Rash   Short Ragweed Pollen Ext     Other reaction(s): Other    Patient Measurements: Height: 5' (152.4 cm) Weight: 58.1 kg (128 lb) IBW/kg (Calculated) : 45.5 Heparin Dosing Weight: 57.2 kg  Vital Signs: Temp: 98.7 F (37.1 C) (11/19 1021) Temp Source: Oral (11/19 1021) BP: 96/54 (11/19 1330) Pulse Rate: 112 (11/19 1331)  Labs: Recent Labs    08/10/22 1103  HGB 11.8*  HCT 37.0  PLT 225  LABPROT 13.9  INR 1.1  CREATININE 1.22*  TROPONINIHS 16    Estimated Creatinine Clearance: 22 mL/min (A) (by C-G formula based on SCr of 1.22 mg/dL (H)).   Medical History: Past Medical History:  Diagnosis Date   Anxiety    Arthritis    COVID-19    04/2021   GERD (gastroesophageal reflux disease)    Hearing loss    has hearing aids   High cholesterol    Hypertension    Hypothyroid    Osteoporosis    Scoliosis    Thyroid disease     Medications:  (Not in a hospital admission)  Scheduled:   heparin  3,500 Units Intravenous Once   Infusions:   heparin     PRN: metoprolol tartrate, nitroGLYCERIN  Assessment: 89 yof with a history of HLD, HTN. Heparin per pharmacy consult placed for pulmonary embolus.  CTA PE w/ acute PE with small thrombus burden in subsegmental branch in left lower lobe w/ RVS CT Head with no acute findings  Patient is not on anticoagulation prior to arrival.  Hgb 11.8; plt 225 PT/INR 13.9/1.1  Goal of Therapy:  Heparin level 0.3-0.7 units/ml Monitor platelets by anticoagulation protocol: Yes   Plan:  Give IV heparin 3000 units bolus x 1 Start heparin infusion at 1050 units/hr Check anti-Xa level in 8 hours and daily while on heparin Continue to monitor H&H and  platelets  Lorelei Pont, PharmD, BCPS 08/10/2022 1:40 PM ED Clinical Pharmacist -  [phone]

## 2022-08-10 NOTE — ED Notes (Signed)
Pt provided water, per her request. EDP [place] aware and agreeable.

## 2022-08-10 NOTE — ED Provider Notes (Signed)
[facility] EMERGENCY DEPARTMENT Provider Note   CSN: [id number] Arrival date & time: 08/10/22  1009     History  Chief Complaint  Patient presents with   Altered Mental Status    Erin Ryan is a 88 y.o. female.  HPI      88 year old female with a history of hyperlipidemia, hypertension, asthma, CKD, COPD, hypothyroidism, sciatica, compression fracture, bilateral hearing loss, memory difficulty, suspected vascular dementia who presents with concern for generalized weakness, confusion, chest pain on arrival to the ED.   Daughter reports she had some urinary symptoms last week but seemed to be improved and kept monitoring her symptoms. This morning, she was generally weak, confused, not acting herself. Denies numbness, weakness, difficulty talking or walking, visual changes or facial droop. No n/v/d.  Has chronic dyspnea with exertion but did not seem acutely more short of breath.  This AM, appeared weak but did not express chest pain, however after arrival to the ED reports chest pain.  Reports pain with deep breaths. Sharp.  No headache.       Past Medical History:  Diagnosis Date   Anxiety    Arthritis    COVID-19    04/2021   GERD (gastroesophageal reflux disease)    Hearing loss    has hearing aids   High cholesterol    Hypertension    Hypothyroid    Osteoporosis    Scoliosis    Thyroid disease      Home Medications Prior to Admission medications   Medication Sig Start Date End Date Taking? Authorizing Provider  amLODipine (NORVASC) 2.5 MG tablet Take 2.5 mg by mouth daily.    [provider]  aspirin 81 MG tablet Take 81 mg by mouth daily.    [provider]  atorvastatin (LIPITOR) 40 MG tablet Take 40 mg by mouth daily.    [provider]  Calcium Citrate-Vitamin D (CALCIUM CITRATE + PO) Take 1 tablet 2 (two) times daily by mouth.    [provider]  denosumab (PROLIA) 60 MG/ML SOSY injection See admin  instructions. 06/24/17   [provider]  donepezil (ARICEPT) 10 MG tablet Take 1 tablet (10 mg total) by mouth at bedtime. 05/20/22   Ward Givens, NP  DULoxetine (CYMBALTA) 30 MG capsule Take 30 mg by mouth daily. 09/06/21   [provider]  furosemide (LASIX) 20 MG tablet Take 1 tablet (20 mg total) by mouth every other day. 07/26/21 10/24/21  Lendon Colonel, NP  gabapentin (NEURONTIN) 100 MG capsule Take 100 mg by mouth at bedtime. 05/07/22   [provider]  levothyroxine (SYNTHROID) 125 MCG tablet daw-1 1 tablet on an empty stomach in the morning    [provider]  metoprolol tartrate (LOPRESSOR) 25 MG tablet TAKE 1 TABLET BY MOUTH TWICE A DAY 12/30/21   Minus Breeding, MD  mirtazapine (REMERON) 15 MG tablet Take 15 mg by mouth at bedtime. 10/08/21   [provider]  omeprazole (PRILOSEC) 40 MG capsule Take 1 capsule (40 mg total) by mouth 2 (two) times daily before a meal. 09/11/17   Tanda Rockers, MD      Allergies    Hydrochlorothiazide, Lyrica [pregabalin], Diltiazem hcl er, and Short ragweed pollen ext    Review of Systems   Review of Systems  Physical Exam Updated Vital Signs BP 128/62   Pulse 68   Temp 98 F (36.7 C) (Oral)   Resp 15   Ht 5' (1.524 m)  Wt 58.1 kg   SpO2 100%   BMI 25.00 kg/m  Physical Exam Vitals and nursing note reviewed.  Constitutional:      General: She is not in acute distress.    Appearance: Normal appearance. She is well-developed. She is not ill-appearing or diaphoretic.  HENT:     Head: Normocephalic and atraumatic.  Eyes:     General: No visual field deficit.    Extraocular Movements: Extraocular movements intact.     Conjunctiva/sclera: Conjunctivae normal.     Pupils: Pupils are equal, round, and reactive to light.  Cardiovascular:     Rate and Rhythm: Tachycardia present. Rhythm irregular.     Pulses: Normal pulses.     Heart sounds: Normal heart sounds. No murmur heard.    No  friction rub. No gallop.  Pulmonary:     Effort: Pulmonary effort is normal. No respiratory distress.     Breath sounds: Normal breath sounds. No wheezing or rales.  Abdominal:     General: There is no distension.     Palpations: Abdomen is soft.     Tenderness: There is no abdominal tenderness. There is no guarding.  Musculoskeletal:        General: No swelling or tenderness.     Cervical back: Normal range of motion.  Skin:    General: Skin is warm and dry.     Findings: No erythema or rash.  Neurological:     General: No focal deficit present.     Mental Status: She is alert.     GCS: GCS eye subscore is 4. GCS verbal subscore is 5. GCS motor subscore is 6.     Cranial Nerves: No cranial nerve deficit, dysarthria or facial asymmetry.     Sensory: No sensory deficit.     Motor: No weakness or tremor.     Coordination: Coordination normal. Finger-Nose-Finger Test normal.     Gait: Gait normal.     ED Results / Procedures / Treatments   Labs (all labs ordered are listed, but only abnormal results are displayed) Labs Reviewed  CBC WITH DIFFERENTIAL/PLATELET - Abnormal; Notable for the following components:      Result Value   WBC 12.5 (*)    Hemoglobin 11.8 (*)    Neutro Abs 9.8 (*)    Monocytes Absolute 1.5 (*)    Abs Immature Granulocytes 0.09 (*)    All other components within normal limits  COMPREHENSIVE METABOLIC PANEL - Abnormal; Notable for the following components:   Glucose, Bld 139 (*)    BUN 51 (*)    Creatinine, Ser 1.22 (*)    Calcium 7.9 (*)    Albumin 2.8 (*)    GFR, Estimated 41 (*)    All other components within normal limits  URINALYSIS, ROUTINE W REFLEX MICROSCOPIC - Abnormal; Notable for the following components:   APPearance CLOUDY (*)    Hgb urine dipstick TRACE (*)    Protein, ur 30 (*)    Leukocytes,Ua LARGE (*)    All other components within normal limits  BRAIN NATRIURETIC PEPTIDE - Abnormal; Notable for the following components:   B  Natriuretic Peptide 523.6 (*)    All other components within normal limits  URINALYSIS, MICROSCOPIC (REFLEX) - Abnormal; Notable for the following components:   Bacteria, UA FEW (*)    All other components within normal limits  RESP PANEL BY RT-PCR (FLU A&B, COVID) ARPGX2  URINE CULTURE  LIPASE, BLOOD  LACTIC ACID, PLASMA  PROTIME-INR  APTT  HEPARIN LEVEL (UNFRACTIONATED)  HEPARIN LEVEL (UNFRACTIONATED)  APTT  TROPONIN I (HIGH SENSITIVITY)  TROPONIN I (HIGH SENSITIVITY)    EKG EKG Interpretation  Date/Time:  Sunday August 10 2022 10:49:45 EST Ventricular Rate:  87 PR Interval:  174 QRS Duration: 96 QT Interval:  361 QTC Calculation: 435 R Axis:   50 Text Interpretation: Sinus arrhythmia Inferior infarct, acute (LCx) Probable anterior infarct, acute Lateral leads are also involved Similar to previous ECG today, inferiorlateral STE Confirmed by Gareth Morgan [phone]) on 08/10/2022 2:28:12 PM  Radiology CT Angio Chest PE W and/or Wo Contrast  Result Date: 08/10/2022 CLINICAL DATA:  High clinical suspicion for PE EXAM: CT ANGIOGRAPHY CHEST WITH CONTRAST TECHNIQUE: Multidetector CT imaging of the chest was performed using the standard protocol during bolus administration of intravenous contrast. Multiplanar CT image reconstructions and MIPs were obtained to evaluate the vascular anatomy. RADIATION DOSE REDUCTION: This exam was performed according to the departmental dose-optimization program which includes automated exposure control, adjustment of the mA and/or kV according to patient size and/or use of iterative reconstruction technique. CONTRAST:  60m OMNIPAQUE IOHEXOL 350 MG/ML SOLN COMPARISON:  CT done on 03/01/2022, chest radiograph done on 08/10/2022 FINDINGS: Cardiovascular: There is homogeneous enhancement in thoracic aorta. Scattered calcifications are seen in thoracic aorta and its major branches. Coronary artery calcifications are seen. Intraluminal filling defect is  seen in a subsegmental pulmonary artery branch in posterior left lower lung field. RV LV ratio is 1.4. Small pocket of air is seen in right subclavian vein, possibly introduced during venipuncture. Mediastinum/Nodes: No significant lymphadenopathy is seen in mediastinum. There is gaseous distention of thoracic esophagus. Lungs/Pleura: Small patchy infiltrates are seen in right middle lobe, left upper lobe and both lower lobes. There is mild thickening of interlobular septi. Minimal bilateral pleural effusions are seen, more so on the right side. Upper Abdomen: There is fatty infiltration in liver. There is 2.6 cm cyst in the left lobe of liver. Small hiatal hernia is seen. Left kidney is smaller than usual. This may be a congenital variation or suggest left renal artery stenosis. Musculoskeletal: No acute findings are seen. Review of the MIP images confirms the above findings. IMPRESSION: There is acute PE with small thrombus burden in subsegmental branch in left lower lobe. There is right ventricular strain which may be chronic. RV LV ratio is 1.4. Aortic atherosclerosis.  Coronary artery calcifications are seen. There are small patchy densities in both mid and both lower lung fields suggesting atelectasis/pneumonia. Minimal bilateral pleural effusions, more so on the right side. There is mild thickening of interlobular septi suggesting possible minimal interstitial edema. Imaging finding of pulmonary embolism was relayed to patient's provider Dr. SBilly Fischerby telephone call. Fatty liver.  Left hepatic cyst.  Left kidney is smaller than right. Electronically Signed   By: PElmer PickerM.D.   On: 08/10/2022 13:32   CT Head Wo Contrast  Result Date: 08/10/2022 CLINICAL DATA:  [place] EXAM: CT HEAD WITHOUT CONTRAST TECHNIQUE: Contiguous axial images were obtained from the base of the skull through the vertex without intravenous contrast. RADIATION DOSE REDUCTION: This exam was performed according to the  departmental dose-optimization program which includes automated exposure control, adjustment of the mA and/or kV according to patient size and/or use of iterative reconstruction technique. COMPARISON:  CT Head 03/01/22. FINDINGS: Brain: No evidence of acute infarction, hemorrhage, hydrocephalus, extra-axial collection or mass lesion/mass effect. Sequela of moderate chronic microvascular ischemic change. Vascular: No hyperdense vessel or unexpected calcification. Skull: Normal. Negative for  fracture or focal lesion. Sinuses/Orbits: Bilateral lens replacements. Bilateral staphyloma is. Mild mucosal thickening left maxillary sinus. Other: None. IMPRESSION: No CT finding to explain altered mental status. Electronically Signed   By: Marin Roberts M.D.   On: 08/10/2022 11:42   DG Chest Portable 1 View  Result Date: 08/10/2022 CLINICAL DATA:  Shortness of breath EXAM: PORTABLE CHEST 1 VIEW COMPARISON:  06/25/2017 FINDINGS: There is poor inspiration. Cardiac size is within normal limits. There are no signs of alveolar pulmonary edema or focal pulmonary consolidation. Left lateral costophrenic angle is indistinct. IMPRESSION: There are no signs of alveolar pulmonary edema or focal pulmonary consolidation. Left lateral CP angle is indistinct which may be due to poor inspiration or suggest minimal effusion. Electronically Signed   By: Elmer Picker M.D.   On: 08/10/2022 11:39    Procedures .Critical Care  Performed by: Gareth Morgan, MD Authorized by: Gareth Morgan, MD   Critical care provider statement:    Critical care time (minutes):  75   Critical care was time spent personally by me on the following activities:  Development of treatment plan with patient or surrogate, discussions with consultants, evaluation of patient's response to treatment, examination of patient, ordering and review of laboratory studies, ordering and review of radiographic studies, ordering and performing treatments and  interventions, pulse oximetry, re-evaluation of patient's condition and review of old charts     Medications Ordered in ED Medications  nitroGLYCERIN (NITROSTAT) SL tablet 0.4 mg (0.4 mg Sublingual Given 08/10/22 1612)  metoprolol tartrate (LOPRESSOR) injection 5 mg (5 mg Intravenous Given 08/10/22 1323)  heparin ADULT infusion 100 units/mL (25000 units/295m) (1,050 Units/hr Intravenous Infusion Verify 08/10/22 1600)  aspirin chewable tablet 324 mg (324 mg Oral Given 08/10/22 1216)  metoprolol tartrate (LOPRESSOR) injection 5 mg (5 mg Intravenous Given 08/10/22 1229)  cefTRIAXone (ROCEPHIN) 1 g in sodium chloride 0.9 % 100 mL IVPB (0 g Intravenous Stopped 08/10/22 1330)  lactated ringers bolus 500 mL (0 mLs Intravenous Stopped 08/10/22 1331)  iohexol (OMNIPAQUE) 350 MG/ML injection 60 mL (60 mLs Intravenous Contrast Given 08/10/22 1307)  heparin bolus via infusion 3,000 Units (3,000 Units Intravenous Bolus from Bag 08/10/22 1409)    ED Course/ Medical Decision Making/ A&P                            88year old female with a history of hyperlipidemia, hypertension, asthma, CKD, COPD, hypothyroidism, sciatica, compression fracture, bilateral hearing loss, memory difficulty, suspected vascular dementia who presents with concern for generalized weakness, confusion, chest pain on arrival to the ED.   Initially did not report chest pain, however ECG obtained for generalized weakness and personally evaluated by me with STE inferior and lateral leads increased from prior.  Paged Dr. BGwenlyn Found pt then began to report pleuritic chest pain--Dr. BGwenlyn FoundSTEMI doctor evaluated the ECG and did not feel she was appropriate to take to the cath lab.  DDx for generalized weakness and chest pain includes ACS, infection, pneumonia, dissection, pneumothorax.   CT head completed given confusion at home shows no evidence of ICH.  CXR without clear acute abnormalities.   Labs completed and personally abided  interpreted by me.  Delta troponin is negative and have low suspicion for ACS.  Lactic acid was within normal limits.  COVID and flu testing negative.  Mild leukocytosis present.  Creatinine mildly increased from prior, no significant electrolyte abnormalities.  No sign of pancreatitis.  While in the emergency department,  developed atrial fibrillation with RVR with a rate in the 140s.  She was given IV metoprolol initially with no improvement, given additional dose of IV metoprolol with improvement of her heart rate ranging from the 100s to the 120s.  Urinalysis looks consistent with urinary tract infection.  Given IV fluid and Rocephin.  CT PE study was done given her pleuritic chest pain and new onset atrial fibrillation shows an acute PE with a small thrombus burden in the subsegmental branch in the left lower lobe, right ventricular strain which may be chronic given no significant clot burden on CT.  There are other patchy densities which may represent atelectasis or pneumonia, pleural effusions, findings which may suggest interstitial edema.  Atrial fibrillation may be triggered by her PE, or urinary tract infection.  Overall, do not feel presentation is consistent with sepsis.  Called for her admission, and after this she did convert back into a normal sinus rhythm.  Discussed goals of care with family who is discussing with other family members---she has stated she did not want to be on a ventilator or in the nursing home, and per their discussion of her wishes it sounds like she would wish to be DNR however family reports they would like to talk about this.          Final Clinical Impression(s) / ED Diagnoses Final diagnoses:  Altered mental status, unspecified altered mental status type  Generalized weakness  Atrial fibrillation with RVR ([place])  Urinary tract infection without hematuria, site unspecified    Rx / DC Orders ED Discharge Orders     None         Gareth Morgan, MD 08/10/22 1622

## 2022-08-10 NOTE — Progress Notes (Signed)
Plan of Care Note for accepted transfer   Patient: Erin Ryan MRN: [id number]   [place]: 08/10/2022  Facility requesting transfer: [facility].  Requesting Provider: Gareth Morgan, MD. Reason for transfer: Acute PE, A-fib with RVR and UTI. Facility course:  The patient was taken by her daughter due to confusion.  She has a history of pneumonia and there was concern for UTI.  She was noticed to be in A-fib with RVR.  CTA was done the patient has a pulmonary embolism.  She has been given ceftriaxone 1 g IVPB, metoprolol 5 mg IVP, LR 500 mg IVP and was started on an heparin infusion.  Plan of care: The patient is accepted for admission to [facility] unit, at [facility].  Author: Reubin Milan, MD 08/10/2022  Check [email or web address] for on-call coverage.  Nursing staff, Please call [place] number on Amion as soon as patient's arrival, so appropriate admitting provider can evaluate the pt.

## 2022-08-10 NOTE — H&P (Signed)
[facility] History and Physical  Erin Ryan UVO:[id number] DOB: October 03, 1924 DOA: 08/10/2022 PCP: Harlan Stains, MD  Admitted from: Home Chief Complaint: Confusion  History of Present Illness: Erin Ryan is a 88 y.o. female with PMH significant for dementia, HTN, HLD, CKD, COPD, GERD, hypothyroidism, hearing loss, anxiety, arthritis, compression fracture.   This morning, patient was brought to the ED at [facility] by her daughter with confusion, concerning for UTI.  Per daughter, patient had some urinary symptoms last week but was improved on its own.  This morning, patient was weak, acting confused.  No fever.  In the ED, patient was afebrile, initially heart rate in 90s, blood pressure 130/53, breathing on room air  While in the ED, she started having chest pain.  She was noted to be in A-fib with RVR with heart rate up to 160 bpm.  She was given a dose of IV metoprolol 5 mg with conversion to normal sinus rhythm with heart rate in 70s Labs showed WBC count 12.5, hemoglobin 9.8, platelet 225, sodium 135, potassium 4, BUN/creatinine 51/1.22, glucose 139, BNP elevated 524, troponin normal, lactic acid level normal Respite virus panel negative for COVID, flu Urinalysis showed cloudy yellow urine with trace hemoglobin, large leukocytes, negative nitrate, few bacteria CT angio chest showed an acute PE with small thrombus burden in the subsegmental branch of the left lower lobe with RV strain.  It also showed small patchy densities in both mid and both lower lung fields suggesting atelectasis/pneumonia, minimal bilateral pleural effusion R>L. CT head with no acute findings. Patient was started on heparin drip.  Also empirically started on IV Rocephin. Accepted to [facility] service for inpatient admission and management.  At the time of my evaluation, patient was lying down in bed.  On low-flow oxygen for comfort.  Extremely hard of hearing.  Not in any distress.  She was able to  give some details of the history. Shortly before my eval, patient is back in A-fib with RVR again up to 140s without any symptoms.  No family at bedside. Called and discussed with patient's daughter Erin Ryan on the phone  Review of Systems:  All systems were reviewed and were negative unless otherwise mentioned in the HPI   Past medical history: Past Medical History:  Diagnosis Date   Anxiety    Arthritis    COVID-19    04/2021   GERD (gastroesophageal reflux disease)    Hearing loss    has hearing aids   High cholesterol    Hypertension    Hypothyroid    Osteoporosis    Scoliosis    Thyroid disease     Past surgical history: Past Surgical History:  Procedure Laterality Date   CATARACT EXTRACTION     cysts removed from bilateral breasts     THYROIDECTOMY      Social History:  reports that she has never smoked. She has never used smokeless tobacco. She reports that she does not drink alcohol and does not use drugs.  Allergies:  Allergies  Allergen Reactions   Hydrochlorothiazide     hyponatremia   Lyrica [Pregabalin] Other (See Comments)    Severe confusion   Diltiazem Hcl Er Rash   Short Ragweed Pollen Ext     Other reaction(s): Other   Hydrochlorothiazide, Lyrica [pregabalin], Diltiazem hcl er, and Short ragweed pollen ext   Family history:  Family History  Problem Relation Age of Onset   Heart disease Mother 74  Heart attack   Bladder Cancer Mother    Heart disease Father    Asthma Father    Heart attack Brother        Age undetermined     Home Meds: Prior to Admission medications   Medication Sig Start Date End Date Taking? Authorizing Provider  amLODipine (NORVASC) 2.5 MG tablet Take 2.5 mg by mouth daily.    [provider]  aspirin 81 MG tablet Take 81 mg by mouth daily.    [provider]  atorvastatin (LIPITOR) 40 MG tablet Take 40 mg by mouth daily.    [provider]  Calcium Citrate-Vitamin D (CALCIUM CITRATE  + PO) Take 1 tablet 2 (two) times daily by mouth.    [provider]  denosumab (PROLIA) 60 MG/ML SOSY injection See admin instructions. 06/24/17   [provider]  donepezil (ARICEPT) 10 MG tablet Take 1 tablet (10 mg total) by mouth at bedtime. 05/20/22   Ward Givens, NP  DULoxetine (CYMBALTA) 30 MG capsule Take 30 mg by mouth daily. 09/06/21   [provider]  furosemide (LASIX) 20 MG tablet Take 1 tablet (20 mg total) by mouth every other day. 07/26/21 10/24/21  Lendon Colonel, NP  gabapentin (NEURONTIN) 100 MG capsule Take 100 mg by mouth at bedtime. 05/07/22   [provider]  levothyroxine (SYNTHROID) 125 MCG tablet daw-1 1 tablet on an empty stomach in the morning    [provider]  metoprolol tartrate (LOPRESSOR) 25 MG tablet TAKE 1 TABLET BY MOUTH TWICE A DAY 12/30/21   Minus Breeding, MD  mirtazapine (REMERON) 15 MG tablet Take 15 mg by mouth at bedtime. 10/08/21   [provider]  omeprazole (PRILOSEC) 40 MG capsule Take 1 capsule (40 mg total) by mouth 2 (two) times daily before a meal. 09/11/17   Tanda Rockers, MD    Physical Exam: Vitals:   08/10/22 1550 08/10/22 1600 08/10/22 1708 08/10/22 1711  BP:  128/62 138/75   Pulse:  68 74   Resp:  15 19   Temp: 98 F (36.7 C)  98 F (36.7 C)   TempSrc: Oral  Oral   SpO2:  100% 100%   Weight:    57.8 kg  Height:    '5\' 2"'$  (1.575 m)   Wt Readings from Last 3 Encounters:  08/10/22 57.8 kg  05/20/22 54.4 kg  11/12/21 50.1 kg   Body mass index is 23.31 kg/m.  General exam: Pleasant, elderly Caucasian female.  Not in physical distress Skin: No rashes, lesions or ulcers. HEENT: Atraumatic, normocephalic, no obvious bleeding Lungs: Diminished air entry bilaterally in the bases.  No cough or wheezing CVS: A-fib with RVR up to 140s at this time, no murmur GI/Abd soft, nontender, nondistended, bowel sound present CNS: Alert, awake, hard of hearing, able to follow  commands Psychiatry: Mood appropriate Extremities: No pedal edema, no calf tenderness     Consult Orders  (From admission, onward)           Start     Ordered   08/10/22 1820  Consult to Registered Dietitian  Once       Provider:  (Not yet assigned)  Question:  Reason for consult?  Answer:  Assessment of nutrition requirements/status   08/10/22 1819   08/10/22 1815  SLP eval and treat Reason for evaluation: .Swallowing evaluation (BSE, MBS and/or diet order as indicated)  Once       Question:  Reason for evaluation  Answer:  .  Swallowing evaluation (BSE, MBS and/or diet order as indicated)   08/10/22 1814   08/10/22 1813  PT eval and treat  Routine        08/10/22 1813   08/10/22 1342  Consult to hospitalist  Called [facility] [place].  Once       Provider:  (Not yet assigned)  Question Answer Comment  Place call to: [facility]   Reason for Consult Admit      08/10/22 1342            Labs on Admission:   CBC: Recent Labs  Lab 08/10/22 1103  WBC 12.5*  NEUTROABS 9.8*  HGB 11.8*  HCT 37.0  MCV 89.2  PLT 034    Basic Metabolic Panel: Recent Labs  Lab 08/10/22 1103  NA 135  K 4.0  CL 102  CO2 24  GLUCOSE 139*  BUN 51*  CREATININE 1.22*  CALCIUM 7.9*    Liver Function Tests: Recent Labs  Lab 08/10/22 1103  AST 28  ALT 18  ALKPHOS 64  BILITOT 0.4  PROT 6.5  ALBUMIN 2.8*   Recent Labs  Lab 08/10/22 1103  LIPASE 37   No results for input(s): "AMMONIA" in the last 168 hours.  Cardiac Enzymes: No results for input(s): "CKTOTAL", "CKMB", "CKMBINDEX", "TROPONINI" in the last 168 hours.  BNP (last 3 results) Recent Labs    08/10/22 1103  BNP 523.6*    ProBNP (last 3 results) No results for input(s): "PROBNP" in the last 8760 hours.  CBG: No results for input(s): "GLUCAP" in the last 168 hours.  Lipase     Component Value Date/Time   LIPASE 37 08/10/2022 1103     Urinalysis    Component Value Date/Time   COLORURINE  YELLOW 08/10/2022 1153   APPEARANCEUR CLOUDY (A) 08/10/2022 1153   LABSPEC 1.015 08/10/2022 1153   PHURINE 6.5 08/10/2022 1153   GLUCOSEU NEGATIVE 08/10/2022 1153   HGBUR TRACE (A) 08/10/2022 1153   BILIRUBINUR NEGATIVE 08/10/2022 1153   KETONESUR NEGATIVE 08/10/2022 1153   PROTEINUR 30 (A) 08/10/2022 1153   NITRITE NEGATIVE 08/10/2022 1153   LEUKOCYTESUR LARGE (A) 08/10/2022 1153     Drugs of Abuse  No results found for: "LABOPIA", "COCAINSCRNUR", "LABBENZ", "AMPHETMU", "THCU", "LABBARB"    Radiological Exams on Admission: CT Angio Chest PE W and/or Wo Contrast  Result Date: 08/10/2022 CLINICAL DATA:  High clinical suspicion for PE EXAM: CT ANGIOGRAPHY CHEST WITH CONTRAST TECHNIQUE: Multidetector CT imaging of the chest was performed using the standard protocol during bolus administration of intravenous contrast. Multiplanar CT image reconstructions and MIPs were obtained to evaluate the vascular anatomy. RADIATION DOSE REDUCTION: This exam was performed according to the departmental dose-optimization program which includes automated exposure control, adjustment of the mA and/or kV according to patient size and/or use of iterative reconstruction technique. CONTRAST:  64m OMNIPAQUE IOHEXOL 350 MG/ML SOLN COMPARISON:  CT done on 03/01/2022, chest radiograph done on 08/10/2022 FINDINGS: Cardiovascular: There is homogeneous enhancement in thoracic aorta. Scattered calcifications are seen in thoracic aorta and its major branches. Coronary artery calcifications are seen. Intraluminal filling defect is seen in a subsegmental pulmonary artery branch in posterior left lower lung field. RV LV ratio is 1.4. Small pocket of air is seen in right subclavian vein, possibly introduced during venipuncture. Mediastinum/Nodes: No significant lymphadenopathy is seen in mediastinum. There is gaseous distention of thoracic esophagus. Lungs/Pleura: Small patchy infiltrates are seen in right middle lobe, left upper  lobe and both lower lobes. There  is mild thickening of interlobular septi. Minimal bilateral pleural effusions are seen, more so on the right side. Upper Abdomen: There is fatty infiltration in liver. There is 2.6 cm cyst in the left lobe of liver. Small hiatal hernia is seen. Left kidney is smaller than usual. This may be a congenital variation or suggest left renal artery stenosis. Musculoskeletal: No acute findings are seen. Review of the MIP images confirms the above findings. IMPRESSION: There is acute PE with small thrombus burden in subsegmental branch in left lower lobe. There is right ventricular strain which may be chronic. RV LV ratio is 1.4. Aortic atherosclerosis.  Coronary artery calcifications are seen. There are small patchy densities in both mid and both lower lung fields suggesting atelectasis/pneumonia. Minimal bilateral pleural effusions, more so on the right side. There is mild thickening of interlobular septi suggesting possible minimal interstitial edema. Imaging finding of pulmonary embolism was relayed to patient's provider Dr. Billy Fischer by telephone call. Fatty liver.  Left hepatic cyst.  Left kidney is smaller than right. Electronically Signed   By: Elmer Picker M.D.   On: 08/10/2022 13:32   CT Head Wo Contrast  Result Date: 08/10/2022 CLINICAL DATA:  Barium EXAM: CT HEAD WITHOUT CONTRAST TECHNIQUE: Contiguous axial images were obtained from the base of the skull through the vertex without intravenous contrast. RADIATION DOSE REDUCTION: This exam was performed according to the departmental dose-optimization program which includes automated exposure control, adjustment of the mA and/or kV according to patient size and/or use of iterative reconstruction technique. COMPARISON:  CT Head 03/01/22. FINDINGS: Brain: No evidence of acute infarction, hemorrhage, hydrocephalus, extra-axial collection or mass lesion/mass effect. Sequela of moderate chronic microvascular ischemic change.  Vascular: No hyperdense vessel or unexpected calcification. Skull: Normal. Negative for fracture or focal lesion. Sinuses/Orbits: Bilateral lens replacements. Bilateral staphyloma is. Mild mucosal thickening left maxillary sinus. Other: None. IMPRESSION: No CT finding to explain altered mental status. Electronically Signed   By: Marin Roberts M.D.   On: 08/10/2022 11:42   DG Chest Portable 1 View  Result Date: 08/10/2022 CLINICAL DATA:  Shortness of breath EXAM: PORTABLE CHEST 1 VIEW COMPARISON:  06/25/2017 FINDINGS: There is poor inspiration. Cardiac size is within normal limits. There are no signs of alveolar pulmonary edema or focal pulmonary consolidation. Left lateral costophrenic angle is indistinct. IMPRESSION: There are no signs of alveolar pulmonary edema or focal pulmonary consolidation. Left lateral CP angle is indistinct which may be due to poor inspiration or suggest minimal effusion. Electronically Signed   By: Elmer Picker M.D.   On: 08/10/2022 11:39     ------------------------------------------------------------------------------------------------------ Assessment/Plan: Principal Problem:   Acute pulmonary embolism ([place])  Acute pulmonary embolism Patient complained of chest pain while in the ED and hence CTPA was obtained.  It showed an acute PE with small thrombus burden subsegmental branch of the left lower lobe with RV strain as well. Started on heparin drip.  Need to be started on oral anticoagulant prior to discharge. Obtain ultrasound duplex of lower extremities. O2 for comfort only at this time.  Wean down as tolerated.  A-fib with RVR No history of A-fib in the past In the ED patient was briefly in A-fib with RVR up to 150s and apparently responded to metoprolol IV.  Shortly before my evaluation this evening, patient is back on A-fib with RVR again.  We will try 1 more dose of IV metoprolol 5 mg.  I will also resume her home dose of metoprolol at 25 mg twice  daily Already on anticoagulation because of PE  UTI Urinalysis showed cloudy yellow urine with trace hemoglobin, large leukocytes, negative nitrate, few bacteria IV Rocephin started in the ED.  Continue the same for now.  Urine culture sent Monitor for temperature, WBC count Recent Labs  Lab 08/10/22 1103 08/10/22 1154  WBC 12.5*  --   LATICACIDVEN  --  1.7   Mild bilateral pneumonia CT chest also showed small patchy densities in both mid and both lower lung fields suggesting atelectasis/pneumonia, minimal bilateral pleural effusion R>L. Already on IV Rocephin for UTI. Continue to monitor respiratory status.  Acute metabolic encephalopathy Vascular dementia, anxiety Primarily brought from home with confusion.  Likely secondary to UTI, pneumonia Continue to monitor mental status changes Continue supportive care for dementia.  Continue Aricept, Cymbalta, Neurontin, Remeron nightly  AKI on CKD3a Baseline creatinine less than 1.  Presented with creatinine 1.22 today.  Probably related to UTI. Start on gentle hydration overnight.  Continue to monitor renal function. Recent Labs    11/12/21 1132 08/10/22 1103  BUN 28* 51*  CREATININE 1.00 1.22*   Essential hypertension PTA on amlodipine 2.5 mg daily, Metropol 25 mg twice daily Keep amlodipine on hold while on escalating dose of metoprolol. Continue to monitor blood pressure.  Hyperlipidemia PTA on aspirin and a statin. Continue statin.  COPD As needed bronchodilators  GERD On Prilosec twice daily at home  Hypothyroidism Synthroid  Impaired mobility PT eval ordered  Goals of care   Code Status: Full Code family's wish for now.   Diet:  Diet Order             Diet regular Room service appropriate? Yes; Fluid consistency: Thin  Diet effective now                  DVT prophylaxis:heparin drip     Antimicrobials: ceftriaxone Fluid: NS'@75'$   Consultants: none Family Communication: talked to the daughter  Erin Ryan on the phone Dispo: The patient is from: Home              Anticipated d/c is to: pending PT eval            ------------------------------------------------------------------------------------- Severity of Illness: The appropriate patient status for this patient is INPATIENT. Inpatient status is judged to be reasonable and necessary in order to provide the required intensity of service to ensure the patient's safety. The patient's presenting symptoms, physical exam findings, and initial radiographic and laboratory data in the context of their chronic comorbidities is felt to place them at high risk for further clinical deterioration. Furthermore, it is not anticipated that the patient will be medically stable for discharge from the hospital within 2 midnights of admission.   * I certify that at the point of admission it is my clinical judgment that the patient will require inpatient hospital care spanning beyond 2 midnights from the point of admission due to high intensity of service, high risk for further deterioration and high frequency of surveillance required.*   Signed, Terrilee Croak, MD [facility] 08/10/2022

## 2022-08-10 NOTE — ED Notes (Signed)
Per EDP [place] further dosing of metoprolol to be administered only if pts BP greater than 110/60 and HR > 120.  Will continue to monitor.

## 2022-08-10 NOTE — Progress Notes (Signed)
ANTICOAGULATION CONSULT NOTE - follow up  Pharmacy Consult for Heparin Indication: pulmonary embolus  Allergies  Allergen Reactions   Hydrochlorothiazide     hyponatremia   Lyrica [Pregabalin] Other (See Comments)    Severe confusion   Diltiazem Hcl Er Rash   Short Ragweed Pollen Ext     Other reaction(s): Other    Patient Measurements: Height: '5\' 2"'$  (157.5 cm) Weight: 57.8 kg (127 lb 6.8 oz) IBW/kg (Calculated) : 50.1 Heparin Dosing Weight: 57.2 kg  Vital Signs: Temp: 98.2 F (36.8 C) (11/19 1953) Temp Source: Oral (11/19 1953) BP: 118/71 (11/19 2000) Pulse Rate: 77 (11/19 2000)  Labs: Recent Labs    08/10/22 1103 08/10/22 1330 08/10/22 1506 08/10/22 2147  HGB 11.8*  --   --   --   HCT 37.0  --   --   --   PLT 225  --   --   --   APTT 30  --   --   --   LABPROT 13.9  --   --   --   INR 1.1  --   --   --   HEPARINUNFRC  --   --  AGE OF SPECIMEN MAY AFFECT INTEGRITY OF RESULTS >1.10*  CREATININE 1.22*  --   --   --   TROPONINIHS 16 13  --   --      Estimated Creatinine Clearance: 21.8 mL/min (A) (by C-G formula based on SCr of 1.22 mg/dL (H)).   Medical History: Past Medical History:  Diagnosis Date   Anxiety    Arthritis    COVID-19    04/2021   GERD (gastroesophageal reflux disease)    Hearing loss    has hearing aids   High cholesterol    Hypertension    Hypothyroid    Osteoporosis    Scoliosis    Thyroid disease     Medications:  Medications Prior to Admission  Medication Sig Dispense Refill Last Dose   amLODipine (NORVASC) 2.5 MG tablet Take 2.5 mg by mouth daily.      aspirin 81 MG tablet Take 81 mg by mouth daily.      atorvastatin (LIPITOR) 40 MG tablet Take 40 mg by mouth daily.      Calcium Citrate-Vitamin D (CALCIUM CITRATE + PO) Take 1 tablet 2 (two) times daily by mouth.      denosumab (PROLIA) 60 MG/ML SOSY injection See admin instructions.      donepezil (ARICEPT) 10 MG tablet Take 1 tablet (10 mg total) by mouth at bedtime. 90  tablet 3    DULoxetine (CYMBALTA) 30 MG capsule Take 30 mg by mouth daily.      furosemide (LASIX) 20 MG tablet Take 1 tablet (20 mg total) by mouth every other day. 90 tablet 2    gabapentin (NEURONTIN) 100 MG capsule Take 100 mg by mouth at bedtime.      levothyroxine (SYNTHROID) 125 MCG tablet daw-1 1 tablet on an empty stomach in the morning      metoprolol tartrate (LOPRESSOR) 25 MG tablet TAKE 1 TABLET BY MOUTH TWICE A DAY 180 tablet 3    mirtazapine (REMERON) 15 MG tablet Take 15 mg by mouth at bedtime.      omeprazole (PRILOSEC) 40 MG capsule Take 1 capsule (40 mg total) by mouth 2 (two) times daily before a meal. 180 capsule 3     Scheduled:   albuterol  2.5 mg Nebulization Q6H   atorvastatin  40 mg Oral QHS  Chlorhexidine Gluconate Cloth  6 each Topical Daily   donepezil  10 mg Oral QHS   [START ON 08/11/2022] DULoxetine  30 mg Oral Daily   gabapentin  100 mg Oral QHS   metoprolol tartrate  25 mg Oral BID   mirtazapine  15 mg Oral QHS   pantoprazole  40 mg Oral Daily   senna  1 tablet Oral BID   Infusions:   sodium chloride 50 mL/hr at 08/10/22 1835   [START ON 08/11/2022] cefTRIAXone (ROCEPHIN)  IV     heparin 1,050 Units/hr (08/10/22 1835)   PRN: acetaminophen **OR** acetaminophen, hydrALAZINE, nitroGLYCERIN, mouth rinse, oxyCODONE, polyethylene glycol, traZODone  Assessment: 89 yof with a history of HLD, HTN. Heparin per pharmacy consult placed for pulmonary embolus.  CTA PE w/ acute PE with small thrombus burden in subsegmental branch in left lower lobe w/ RVS CT Head with no acute findings  Patient is not on anticoagulation prior to arrival.  Hgb 11.8; plt 225 PT/INR 13.9/1.1  1st HL > 1.1 supra therapeutic on 1050 units/hr Per RN no bleeding noted (lab drew level and was not witnessed)  Goal of Therapy:  Heparin level 0.3-0.7 units/ml Monitor platelets by anticoagulation protocol: Yes   Plan:  Hold heparin drip x 1 hour then Decrease heparin drip to  900 units/hr Check anti-Xa level in 8 hours and daily while on heparin Continue to monitor H&H and platelets  Dolly Rias RPh 08/10/2022, 10:36 PM

## 2022-08-10 NOTE — ED Notes (Signed)
Pt transported to CT. Will update vitals when pt returns.

## 2022-08-10 NOTE — ED Notes (Signed)
Pt placed on 2L O2 via  for comfort. O2 sats 97% on 2L.

## 2022-08-10 NOTE — ED Notes (Signed)
[facility] at bedside 

## 2022-08-10 NOTE — ED Notes (Signed)
Pt initially denies CP, endorses mid sternal CP at this time

## 2022-08-10 NOTE — ED Triage Notes (Signed)
Pt BIB by daughter, endorse pt is confused, hx of dementia but reports worse this morning, endorses concern for UTI. Bilaterally equal

## 2022-08-11 ENCOUNTER — Other Ambulatory Visit (HOSPITAL_COMMUNITY): Payer: Self-pay

## 2022-08-11 ENCOUNTER — Inpatient Hospital Stay (HOSPITAL_COMMUNITY): Payer: Medicare Other

## 2022-08-11 DIAGNOSIS — I2699 Other pulmonary embolism without acute cor pulmonale: Secondary | ICD-10-CM | POA: Diagnosis not present

## 2022-08-11 DIAGNOSIS — R9431 Abnormal electrocardiogram [ECG] [EKG]: Secondary | ICD-10-CM | POA: Diagnosis not present

## 2022-08-11 DIAGNOSIS — I2609 Other pulmonary embolism with acute cor pulmonale: Secondary | ICD-10-CM | POA: Diagnosis not present

## 2022-08-11 LAB — ECHOCARDIOGRAM COMPLETE
Area-P 1/2: 3.46 cm2
Height: 62 in
S' Lateral: 1.6 cm
Weight: 2038.81 oz

## 2022-08-11 LAB — BASIC METABOLIC PANEL
Anion gap: 9 (ref 5–15)
BUN: 45 mg/dL — ABNORMAL HIGH (ref 8–23)
CO2: 22 mmol/L (ref 22–32)
Calcium: 7.6 mg/dL — ABNORMAL LOW (ref 8.9–10.3)
Chloride: 107 mmol/L (ref 98–111)
Creatinine, Ser: 1.28 mg/dL — ABNORMAL HIGH (ref 0.44–1.00)
GFR, Estimated: 39 mL/min — ABNORMAL LOW (ref >60)
Glucose, Bld: 122 mg/dL — ABNORMAL HIGH (ref 70–99)
Potassium: 4.1 mmol/L (ref 3.5–5.1)
Sodium: 138 mmol/L (ref 135–145)

## 2022-08-11 LAB — APTT: aPTT: 159 seconds — ABNORMAL HIGH (ref 24–36)

## 2022-08-11 LAB — CBC
HCT: 31.3 % — ABNORMAL LOW (ref 36.0–46.0)
Hemoglobin: 9.9 g/dL — ABNORMAL LOW (ref 12.0–15.0)
MCH: 28.9 pg (ref 26.0–34.0)
MCHC: 31.6 g/dL (ref 30.0–36.0)
MCV: 91.5 fL (ref 80.0–100.0)
Platelets: 215 10*3/uL (ref 150–400)
RBC: 3.42 MIL/uL — ABNORMAL LOW (ref 3.87–5.11)
RDW: 14.6 % (ref 11.5–15.5)
WBC: 16 10*3/uL — ABNORMAL HIGH (ref 4.0–10.5)
nRBC: 0 % (ref 0.0–0.2)

## 2022-08-11 LAB — T4, FREE: Free T4: 2.04 ng/dL — ABNORMAL HIGH (ref 0.61–1.12)

## 2022-08-11 LAB — HEPARIN LEVEL (UNFRACTIONATED)
Heparin Unfractionated: 1.07 IU/mL — ABNORMAL HIGH (ref 0.30–0.70)
Heparin Unfractionated: >1.1 IU/mL — ABNORMAL HIGH (ref 0.30–0.70)
Heparin Unfractionated: 0.32 IU/mL (ref 0.30–0.70)

## 2022-08-11 LAB — TSH: TSH: <0.01 u[IU]/mL — ABNORMAL LOW (ref 0.350–4.500)

## 2022-08-11 LAB — PROCALCITONIN: Procalcitonin: 0.54 ng/mL

## 2022-08-11 MED ORDER — HEPARIN (PORCINE) 25000 UT/250ML-% IV SOLN
850.0000 [IU]/h | INTRAVENOUS | Status: DC
  Administered 2022-08-11: 600 [IU]/h via INTRAVENOUS
  Administered 2022-08-12: 750 [IU]/h via INTRAVENOUS
  Administered 2022-08-14: 850 [IU]/h via INTRAVENOUS
  Filled 2022-08-11 (×2): qty 250

## 2022-08-11 MED ORDER — LORAZEPAM 2 MG/ML IJ SOLN
0.5000 mg | INTRAMUSCULAR | Status: DC | PRN
  Administered 2022-08-15: 0.5 mg via INTRAVENOUS
  Filled 2022-08-11: qty 1

## 2022-08-11 MED ORDER — DIGOXIN 0.25 MG/ML IJ SOLN: 0.1250 mg | Freq: Once | INTRAMUSCULAR | Status: DC

## 2022-08-11 MED ORDER — AMIODARONE LOAD VIA INFUSION
150.0000 mg | Freq: Once | INTRAVENOUS | Status: AC
  Administered 2022-08-11: 150 mg via INTRAVENOUS
  Filled 2022-08-11: qty 83.34

## 2022-08-11 MED ORDER — METOPROLOL TARTRATE 25 MG PO TABS
50.0000 mg | ORAL_TABLET | Freq: Two times a day (BID) | ORAL | Status: DC
  Administered 2022-08-11 – 2022-08-13 (×5): 50 mg via ORAL
  Filled 2022-08-11 (×6): qty 2

## 2022-08-11 MED ORDER — BOOST PLUS PO LIQD
237.0000 mL | Freq: Three times a day (TID) | ORAL | Status: DC
  Administered 2022-08-11 – 2022-08-18 (×20): 237 mL via ORAL
  Filled 2022-08-11 (×26): qty 237

## 2022-08-11 MED ORDER — DIGOXIN 0.25 MG/ML IJ SOLN
0.1250 mg | Freq: Once | INTRAMUSCULAR | Status: AC
  Administered 2022-08-11: 0.125 mg via INTRAVENOUS
  Filled 2022-08-11: qty 2

## 2022-08-11 MED ORDER — SODIUM CHLORIDE 0.9 % IV SOLN
3.0000 g | Freq: Two times a day (BID) | INTRAVENOUS | Status: DC
  Administered 2022-08-11 – 2022-08-14 (×6): 3 g via INTRAVENOUS
  Filled 2022-08-11 (×7): qty 8

## 2022-08-11 MED ORDER — AMIODARONE HCL IN DEXTROSE 360-4.14 MG/200ML-% IV SOLN
60.0000 mg/h | INTRAVENOUS | Status: AC
  Administered 2022-08-11 (×2): 60 mg/h via INTRAVENOUS
  Filled 2022-08-11: qty 200

## 2022-08-11 MED ORDER — DILTIAZEM HCL-DEXTROSE 125-5 MG/125ML-% IV SOLN (PREMIX): 5.0000 mg/h | INTRAVENOUS | Status: DC

## 2022-08-11 MED ORDER — METOPROLOL TARTRATE 25 MG PO TABS
25.0000 mg | ORAL_TABLET | Freq: Once | ORAL | Status: AC
  Administered 2022-08-11: 25 mg via ORAL
  Filled 2022-08-11: qty 1

## 2022-08-11 MED ORDER — AMIODARONE HCL IN DEXTROSE 360-4.14 MG/200ML-% IV SOLN
30.0000 mg/h | INTRAVENOUS | Status: DC
  Administered 2022-08-12 (×3): 30 mg/h via INTRAVENOUS
  Filled 2022-08-11 (×4): qty 200

## 2022-08-11 MED ORDER — METOPROLOL TARTRATE 5 MG/5ML IV SOLN
5.0000 mg | Freq: Once | INTRAVENOUS | Status: AC
  Administered 2022-08-11: 5 mg via INTRAVENOUS
  Filled 2022-08-11: qty 5

## 2022-08-11 MED ORDER — METOPROLOL TARTRATE 25 MG PO TABS: 50.0000 mg | ORAL_TABLET | Freq: Two times a day (BID) | ORAL | Status: DC

## 2022-08-11 MED ORDER — DILTIAZEM LOAD VIA INFUSION
15.0000 mg | Freq: Once | INTRAVENOUS | Status: DC
  Filled 2022-08-11: qty 15

## 2022-08-11 NOTE — Progress Notes (Signed)
  Amiodarone Drug - Drug Interaction Consult Note  Recommendations: - amiodarone may increase  concentration of lipitor. Monitor pt for s/sx myalgia - can cause bradycardia with lopressor. Monitor HR - can increase risk for QTc prolongation with remeron. Monitor QTc closely   Amiodarone is metabolized by the cytochrome P450 system and therefore has the potential to cause many drug interactions. Amiodarone has an average plasma half-life of 50 days (range 20 to 100 days).   There is potential for drug interactions to occur several weeks or months after stopping treatment and the onset of drug interactions may be slow after initiating amiodarone.   '[x]'$  Statins: Increased risk of myopathy. Simvastatin- restrict dose to '20mg'$  daily. Other statins: counsel patients to report any muscle pain or weakness immediately.  '[]'$  Anticoagulants: Amiodarone can increase anticoagulant effect. Consider warfarin dose reduction. Patients should be monitored closely and the dose of anticoagulant altered accordingly, remembering that amiodarone levels take several weeks to stabilize.  '[]'$  Antiepileptics: Amiodarone can increase plasma concentration of phenytoin, the dose should be reduced. Note that small changes in phenytoin dose can result in large changes in levels. Monitor patient and counsel on signs of toxicity.  '[x]'$  Beta blockers: increased risk of bradycardia, AV block and myocardial depression. Sotalol - avoid concomitant use.  '[]'$   Calcium channel blockers (diltiazem and verapamil): increased risk of bradycardia, AV block and myocardial depression.  '[]'$   Cyclosporine: Amiodarone increases levels of cyclosporine. Reduced dose of cyclosporine is recommended.  '[]'$  Digoxin dose should be halved when amiodarone is started.  '[]'$  Diuretics: increased risk of cardiotoxicity if hypokalemia occurs.  '[]'$  Oral hypoglycemic agents (glyburide, glipizide, glimepiride): increased risk of hypoglycemia. Patient's glucose  levels should be monitored closely when initiating amiodarone therapy.   '[x]'$  Drugs that prolong the QT interval:  Torsades de pointes risk may be increased with concurrent use - avoid if possible.  Monitor QTc, also keep magnesium/potassium WNL if concurrent therapy can't be avoided.  Antibiotics: e.g. fluoroquinolones, erythromycin.  Antiarrhythmics: e.g. quinidine, procainamide, disopyramide, sotalol.  Antipsychotics: e.g. phenothiazines, haloperidol.   Lithium, tricyclic antidepressants, and methadone. Thank Dennis Bast  Lynelle Doctor  08/11/2022 7:32 PM

## 2022-08-11 NOTE — Progress Notes (Signed)
  Echocardiogram 2D Echocardiogram has been performed.  Darlina Sicilian M 08/11/2022, 8:31 AM

## 2022-08-11 NOTE — Progress Notes (Signed)
PT Cancellation Note  Patient Details Name: Erin Ryan MRN: [id number] DOB: 1924/11/16   Cancelled Treatment:    Reason Eval/Treat Not Completed: Fatigue/lethargy limiting ability to participate, patient asleep and  resting . PT will check back this PM.  [place] Office [phone] Weekend [phone]  Claretha Cooper 08/11/2022, 9:05 AM

## 2022-08-11 NOTE — Progress Notes (Signed)
SLP Cancellation Note  Patient Details Name: Erin Ryan MRN: [id number] DOB: [birth year]   Cancelled treatment:       Reason Eval/Treat Not Completed: Patient's level of consciousness. Patient asleep in room and per daughter who was in room with her, "she is so out of it". SLP will check in this PM for patient readiness.   Sonia Baller, MA, CCC-SLP Speech Therapy

## 2022-08-11 NOTE — Progress Notes (Signed)
PT Cancellation Note  Patient Details Name: Brendy Ficek MRN: [id number] DOB: 1925-01-22   Cancelled Treatment:    Reason Eval/Treat Not Completed: Other (comment)Patient just returned to bed after being on [facility]. Will check back another time. [place] Office [phone] Weekend pager-[phone]   Claretha Cooper 08/11/2022, 2:05 PM

## 2022-08-11 NOTE — TOC Initial Note (Signed)
Transition of Care [facility]) - Initial/Assessment Note    Patient Details  Name: Erin Ryan MRN: [id number] Date of Birth: 11/01/1924  Transition of Care [facility]) CM/SW Contact:    Roseanne Kaufman, RN Phone Number: 08/11/2022, 1:14 PM  Clinical Narrative:   No current TOC needs. Spoke with patient's daughter [name] who reports her mother lives with her, and may need [place] services at discharge. This RNCM explained the process and advised TOC will continue to follow.    Transportation at discharge: daughter [name] [facility] [address]  [facility] [place]                Expected Discharge Plan: [place] Barriers to Discharge: Continued Medical Work up   Patient Goals and CMS Choice Patient states their goals for this hospitalization and ongoing recovery are:: return home with home health services CMS Medicare.gov Compare Post Acute Care list provided to:: Patient Represenative (must comment) Darl Householder (daughter))    Expected Discharge Plan and Services Expected Discharge Plan: [place] In-house Referral: NA Discharge Planning Services: CM Consult   Living arrangements for the past 2 months: Single Family Home                 DME Arranged: N/A DME Agency: NA                  Prior Living Arrangements/Services Living arrangements for the past 2 months: Single Family Home Lives with:: Adult Children Patient language and need for interpreter reviewed:: Yes Do you feel safe going back to the place where you live?: Yes      Need for Family Participation in Patient Care: Yes (Comment) Care giver support system in place?: Yes (comment) Current home services: DME (cane and walker) Criminal Activity/Legal Involvement Pertinent to Current Situation/Hospitalization: No - Comment as needed  Activities of Daily Living      Permission Sought/Granted Permission sought to share information with : Case Manager Permission granted to share  information with : Yes, Verbal Permission Granted  Share Information with NAME: Case Manager           Emotional Assessment Appearance:: Appears stated age Attitude/Demeanor/Rapport: Unable to Assess Affect (typically observed): Unable to Assess Orientation: :  (patient presents with confusion/ PMH dementia) Alcohol / Substance Use: Not Applicable Psych Involvement: No (comment)  Admission diagnosis:  Acute pulmonary embolism ([place]) [I26.99] Generalized weakness [R53.1] Atrial fibrillation with RVR ([place]) [I48.91] Urinary tract infection without hematuria, site unspecified [N39.0] Altered mental status, unspecified altered mental status type [R41.82] Patient Active Problem List   Diagnosis Date Noted   Acute pulmonary embolism ([place]) 08/10/2022   Moderate dementia without behavioral disturbance, psychotic disturbance, mood disturbance, or anxiety ([place]) 11/05/2021   Tachycardia 10/13/2021   Upper airway cough syndrome 07/10/2017   Dyspnea on exertion 05/14/2015   Anxiety state 06/26/2014   Chronic kidney disease (CKD), stage III (moderate) (HCC) 06/26/2014   HLD (hyperlipidemia) 06/26/2014   Acid reflux 06/26/2014   Benign hypertensive kidney disease 06/26/2014   OP (osteoporosis) 06/26/2014   Peripheral neuralgia 06/26/2014   Adult hypothyroidism 06/26/2014   Arthralgia of hip or thigh 02/09/2014   GERD 12/06/2007   HYPERLIPIDEMIA 11/15/2007   HYPERTENSION 11/15/2007   PCP:  Harlan Stains, MD Pharmacy:   [facility] ([place], [place] [facility] [address] [place] [address] [place] [address] Phone: [phone] Fax: [phone]  [id number] [address] [place] [address] Phone: [phone] Fax: [phone]     Social Determinants of Health (SDOH) Interventions    Readmission Risk Interventions     No data to display

## 2022-08-11 NOTE — Progress Notes (Signed)
Initial Nutrition Assessment  DOCUMENTATION CODES:   Severe malnutrition in context of chronic illness  INTERVENTION:  - Continue Regular diet - Boost Plus po TID, each supplement provides 360 kcal and 14 grams of protein - Encourage intake of meals and supplements. Family to aid in ordering meals and feeding patient to encourage intake.  - Monitor weight trends   NUTRITION DIAGNOSIS:   Severe Malnutrition related to chronic illness (CKD, COPD, dementia) as evidenced by severe fat depletion, severe muscle depletion.  GOAL:   Patient will meet greater than or equal to 90% of their needs  MONITOR:   PO intake, Supplement acceptance, Weight trends  REASON FOR ASSESSMENT:   Consult Assessment of nutrition requirement/status  ASSESSMENT:   88 y.o. female with PMH significant for dementia, HTN, HLD, CKD, COPD, GERD, hypothyroidism, hearing loss, anxiety, arthritis, compression fracture who lives at home with her daughter who presented with confusion, concerning for UTI.   Patient sitting in bed at time of visit, somewhat confused. Daughter and granddaughter at bedside. Daughter reports patient's UBW to be around 118# and did not feel she had had any major changes In weight recently. Per EMR, patient with possible weight gain of 19# since January. However, family note elevated weight may not be accurate.  They note patient loves Boost and typically consumes 2-3 times a day. Typical meal intake as below: Breakfast: coffee and a Boost Lunch: will eat if with daughter but if daughter gone will sometimes skip, occasional Boost Dinner: meal with daughter Bedtime snack: Boost  They note patient has talked about being afraid to gain weight. Family has been encouraging intake since admission. Patient evaluated by SLP today and cleared for regular diet/thin liquids. They would like for patient to be able to receive Boost if able. Family note they are always with patient and will help with  ordering meals and encouraging intake.   Medications reviewed and include: Remeron, Senokot  Labs reviewed: Creatinine 1.28   NUTRITION - FOCUSED PHYSICAL EXAM:  Flowsheet Row Most Recent Value  Orbital Region Moderate depletion  Upper Arm Region Severe depletion  Thoracic and Lumbar Region Severe depletion  Buccal Region Severe depletion  Temple Region Moderate depletion  Clavicle Bone Region Severe depletion  Clavicle and Acromion Bone Region Severe depletion  Scapular Bone Region Unable to assess  Dorsal Hand Severe depletion  Patellar Region Moderate depletion  Anterior Thigh Region Moderate depletion  Posterior Calf Region Severe depletion  Edema (RD Assessment) None  Hair Reviewed  Eyes Reviewed  Mouth Reviewed  Skin Reviewed  Nails Reviewed       Diet Order:   Diet Order             Diet regular Room service appropriate? Yes; Fluid consistency: Thin  Diet effective now                   EDUCATION NEEDS:   No education needs have been identified at this time  Skin:  Skin Assessment: Reviewed RN Assessment  Last BM:  PTA  Height:  Ht Readings from Last 1 Encounters:  08/10/22 '5\' 2"'$  (1.575 m)   Weight:  Wt Readings from Last 1 Encounters:  08/10/22 57.8 kg    BMI:  Body mass index is 23.31 kg/m.  Estimated Nutritional Needs:  Kcal:  6270-3500 kcals Protein:  80-90 grams Fluid:  >/= 1.75L    Samson Frederic RD, LDN For contact information, refer to [facility].

## 2022-08-11 NOTE — Progress Notes (Signed)
ANTICOAGULATION CONSULT NOTE  Pharmacy Consult for Heparin Indication: pulmonary embolus  Allergies  Allergen Reactions   Ambrosia Trifida (Tall Ragweed) Allergy Skin Test Other (See Comments)    Other reaction(s): Other   Hydrochlorothiazide     hyponatremia   Lyrica [Pregabalin] Other (See Comments)    Severe confusion   Diltiazem Hcl Er Rash   Short Ragweed Pollen Ext     Other reaction(s): Other    Patient Measurements: Height: '5\' 2"'$  (157.5 cm) Weight: 57.8 kg (127 lb 6.8 oz) IBW/kg (Calculated) : 50.1 Heparin Dosing Weight: 57.2 kg  Vital Signs: Temp: 98.9 F (37.2 C) (11/20 0756) Temp Source: Axillary (11/20 0756) BP: 114/40 (11/20 0600) Pulse Rate: 82 (11/20 0600)  Labs: Recent Labs    08/10/22 1103 08/10/22 1330 08/10/22 1506 08/10/22 2147 08/11/22 0344 08/11/22 0931  HGB 11.8*  --   --   --  9.9*  --   HCT 37.0  --   --   --  31.3*  --   PLT 225  --   --   --  215  --   APTT 30  --   --  >200* 159*  --   LABPROT 13.9  --   --   --   --   --   INR 1.1  --   --   --   --   --   HEPARINUNFRC  --   --    < > >1.10* 1.07* >1.10*  CREATININE 1.22*  --   --   --  1.28*  --   TROPONINIHS 16 13  --   --   --   --    < > = values in this interval not displayed.     Estimated Creatinine Clearance: 20.8 mL/min (A) (by C-G formula based on SCr of 1.28 mg/dL (H)).   Medications:  Medications Prior to Admission  Medication Sig Dispense Refill Last Dose   amLODipine (NORVASC) 2.5 MG tablet Take 2.5 mg by mouth daily.      aspirin 81 MG tablet Take 81 mg by mouth daily.      atorvastatin (LIPITOR) 40 MG tablet Take 40 mg by mouth daily.      Calcium Citrate-Vitamin D (CALCIUM CITRATE + PO) Take 1 tablet 2 (two) times daily by mouth.      denosumab (PROLIA) 60 MG/ML SOSY injection See admin instructions.      donepezil (ARICEPT) 10 MG tablet Take 1 tablet (10 mg total) by mouth at bedtime. 90 tablet 3    DULoxetine (CYMBALTA) 30 MG capsule Take 30 mg by mouth  daily.      furosemide (LASIX) 20 MG tablet Take 1 tablet (20 mg total) by mouth every other day. 90 tablet 2    gabapentin (NEURONTIN) 100 MG capsule Take 100 mg by mouth at bedtime.      levothyroxine (SYNTHROID) 125 MCG tablet daw-1 1 tablet on an empty stomach in the morning      metoprolol tartrate (LOPRESSOR) 25 MG tablet TAKE 1 TABLET BY MOUTH TWICE A DAY 180 tablet 3    mirtazapine (REMERON) 15 MG tablet Take 15 mg by mouth at bedtime.      omeprazole (PRILOSEC) 40 MG capsule Take 1 capsule (40 mg total) by mouth 2 (two) times daily before a meal. 180 capsule 3     Scheduled:   albuterol  2.5 mg Nebulization TID   atorvastatin  40 mg Oral QHS   Chlorhexidine Gluconate Cloth  6 each Topical Daily   donepezil  10 mg Oral QHS   DULoxetine  30 mg Oral Daily   gabapentin  100 mg Oral QHS   metoprolol tartrate  50 mg Oral BID   mirtazapine  15 mg Oral QHS   pantoprazole  40 mg Oral Daily   senna  1 tablet Oral BID   Infusions:   sodium chloride 50 mL/hr at 08/10/22 1835   cefTRIAXone (ROCEPHIN)  IV 1 g (08/11/22 0952)   heparin 900 Units/hr (08/10/22 2326)   PRN: acetaminophen **OR** acetaminophen, hydrALAZINE, nitroGLYCERIN, mouth rinse, oxyCODONE, polyethylene glycol, traZODone  Assessment: 89 yof with a history of HLD, HTN. Found to have new LLL PE on CTA with RH strain (may be chronic). Pharmacy consulted to dose IV heparin. Not on anticoagulants PTA.  Today, 08/11/2022: Hgb down 2g from admission (possibly dilutional); Plt stable WNL Heparin level remains elevated despite rate decrease Labs drawn appropriately on opposite arm from heparin; should be accurate No bleeding or infusion issues per RN  Goal of Therapy:  Heparin level 0.3-0.7 units/ml Monitor platelets by anticoagulation protocol: Yes   Plan:  Hold heparin drip x 1 hour then Decrease heparin drip to 600 units/hr Recheck heparin level in 8 hr Daily CBC & heparin level Monitor for s/s of bleeding or  worsening thrombosis  Reuel Boom, PharmD, BCPS [phone] 08/11/2022, 12:08 PM

## 2022-08-11 NOTE — Progress Notes (Addendum)
PROGRESS NOTE  Erin Ryan  DOB: 1924/11/19  PCP: Harlan Stains, MD BOF:[id number]  DOA: 08/10/2022  LOS: 1 day  Hospital Day: 2  Brief narrative: Erin Ryan is a 88 y.o. female with PMH significant for dementia, HTN, HLD, CKD, COPD, GERD, hypothyroidism, hearing loss, anxiety, arthritis, compression fracture who lives at home with her daughter. 11/19, patient was brought to the ED at [facility] by her daughter with confusion, concerning for UTI.  Per daughter, patient had some urinary symptoms last week but was improved on its own.  For the last several weeks, patient is also having progressively worsening dyspnea on exertion.  In the ED, patient was afebrile, initially heart rate in 90s, blood pressure 130/53, breathing on room air  While in the ED, she started having chest pain.  She was noted to be in A-fib with RVR with heart rate up to 160 bpm.  She was given a dose of IV metoprolol 5 mg with conversion to normal sinus rhythm with heart rate in 70s Labs showed WBC count 12.5, hemoglobin 9.8, platelet 225, sodium 135, potassium 4, BUN/creatinine 51/1.22, glucose 139, BNP elevated 524, troponin normal, lactic acid level normal Respite virus panel negative for COVID, flu Urinalysis showed cloudy yellow urine with trace hemoglobin, large leukocytes, negative nitrate, few bacteria CT angio chest showed an acute PE with small thrombus burden in the subsegmental branch of the left lower lobe with RV strain.  It also showed small patchy densities in both mid and both lower lung fields suggesting atelectasis/pneumonia, minimal bilateral pleural effusion R>L. CT head with no acute findings. Patient was started on heparin drip.  Also empirically started on IV Rocephin. Admitted to [facility].  See below for details  Subjective: Patient was seen and examined this morning.  Elderly Caucasian female.  Propped up in bed.  On 2 L oxygen by nasal cannula.  Very hard of hearing. Patient's  daughter Ms. Erin Ryan was at bedside. Patient seems confused and disorder this morning.  Not restless or agitated She was in RVR for few hours last night.  Remains rate controlled this morning to 90s.  Assessment/Plan: Acute pulmonary embolism Patient complained of chest pain while in the ED and hence CTPA was obtained.  It showed an acute PE with small thrombus burden subsegmental branch of the left lower lobe with RV strain as well. Currently on heparin drip.  Need to be started on oral anticoagulant prior to discharge. Pending ultrasound duplex of lower extremities. O2 for comfort only at this time.  Wean down as tolerated.  A-fib with RVR No history of A-fib in the past Patient was in RVR in the ED which responded to IV metoprolol.  RVR recurred again later after admission.  She was given IV metoprolol.  Home dose of metoprolol 25 mg twice daily was resumed as well.   I increased the dose of metoprolol to 50 mg twice daily this morning.  Continue to monitor in telemetry.   An echocardiogram. Already on anticoagulation because of PE  Mild bilateral pneumonia CT chest also showed small patchy densities in both mid and both lower lung fields suggesting atelectasis/pneumonia, minimal bilateral pleural effusion R>L. Currently on IV Rocephin Continue to monitor respiratory status. Monitor for temperature, WBC count Recent Labs  Lab 08/10/22 1103 08/10/22 1154 08/11/22 0344  WBC 12.5*  --  16.0*  LATICACIDVEN  --  1.7  --   PROCALCITON  --   --  0.54   UTI Urinalysis showed cloudy  yellow urine with trace hemoglobin, large leukocytes, negative nitrate, few bacteria Currently on IV Rocephin. Continue the same for now.  Urine culture sent.  Acute respiratory failure with hypoxia Secondary to pneumonia, PE, RVR Currently on 2 L oxygen by nasal cannula.  Wean down as tolerated  Acute metabolic encephalopathy Vascular dementia, anxiety Primarily brought from home with confusion.  Likely  secondary to UTI, pneumonia Remains confused this morning. Continue supportive care for dementia.  Continue Aricept, Cymbalta, Neurontin, Remeron nightly  AKI on CKD3a Baseline creatinine less than 1.  Presented with creatinine 1.22 today.  Probably related to UTI as well as Lasix use.  Currently on IV fluid.  Lasix on hold.  Creatinine did not show much improvement overnight. Continue NS at 50 mill per hour. Recent Labs    11/12/21 1132 08/10/22 1103 08/11/22 0344  BUN 28* 51* 45*  CREATININE 1.00 1.22* 1.28*   Chronic diastolic CHF  essential hypertension CHF appears compensated at this time.  Patient is actually requiring IV fluid. 11/20, TTE showed EF 55 to 46%, grade 2 diastolic dysfunction, mildly enlarged RV and mildly elevated PASP. PTA on amlodipine 2.5 mg daily, metoprolol 25 mg twice daily, Lasix 20 mg every other day Keep amlodipine and Lasix on hold while on escalating dose of metoprolol. Continue to monitor blood pressure.  Hypothyroidism Family states that patient had thyroid removal surgery 5 decades ago and has remained on Synthroid since then.  Recently on Synthroid 125 mcg daily.  Not sure when was the last time it was adjusted. 11/20, TSH level significantly suppressed to less than 0.01.  Pending free T4 level.   I would stop Synthroid at this time and monitor. Recent Labs    08/11/22 0344  TSH <0.010*   Hyperlipidemia PTA on aspirin and a statin.  Currently on heparin drip and statin.    COPD As needed bronchodilators  GERD On Prilosec twice daily at home  Impaired mobility PT eval ordered  Goals of care   Code Status: Full Code family's wish for now.   Diet:  Diet Order             Diet regular Room service appropriate? Yes; Fluid consistency: Thin  Diet effective now                  DVT prophylaxis:heparin drip     Antimicrobials: IV ceftriaxone Fluid: NS'@75'$  to continue Consultants: none Family Communication: talked to the  daughter Erin Ryan at bedside this morning  Status is: Inpatient  Continue in-hospital care because: Ongoing multiple comorbidities Level of care: Stepdown   Dispo: The patient is from: Home              Anticipated d/c is to: Pending clinical course              Patient currently is not medically stable to d/c.   Difficult to place patient No     Infusions:   sodium chloride 50 mL/hr at 08/10/22 1835   cefTRIAXone (ROCEPHIN)  IV 1 g (08/11/22 0952)   heparin 900 Units/hr (08/10/22 2326)    Scheduled Meds:  albuterol  2.5 mg Nebulization TID   atorvastatin  40 mg Oral QHS   Chlorhexidine Gluconate Cloth  6 each Topical Daily   donepezil  10 mg Oral QHS   DULoxetine  30 mg Oral Daily   gabapentin  100 mg Oral QHS   metoprolol tartrate  50 mg Oral BID   mirtazapine  15 mg Oral  QHS   pantoprazole  40 mg Oral Daily   senna  1 tablet Oral BID    PRN meds: acetaminophen **OR** acetaminophen, hydrALAZINE, nitroGLYCERIN, mouth rinse, oxyCODONE, polyethylene glycol, traZODone   Antimicrobials: Anti-infectives (From admission, onward)    Start     Dose/Rate Route Frequency Ordered Stop   08/11/22 1000  cefTRIAXone (ROCEPHIN) 1 g in sodium chloride 0.9 % 100 mL IVPB        1 g 200 mL/hr over 30 Minutes Intravenous Every 24 hours 08/10/22 1806     08/10/22 1230  cefTRIAXone (ROCEPHIN) 1 g in sodium chloride 0.9 % 100 mL IVPB        1 g 200 mL/hr over 30 Minutes Intravenous  Once 08/10/22 1223 08/10/22 1330       Objective: Vitals:   08/11/22 0756 08/11/22 0906  BP:    Pulse:    Resp:    Temp: 98.9 F (37.2 C)   SpO2:  93%    Intake/Output Summary (Last 24 hours) at 08/11/2022 1030 Last data filed at 08/10/2022 1835 Gross per 24 hour  Intake 695.34 ml  Output --  Net 695.34 ml   Filed Weights   08/10/22 1045 08/10/22 1711  Weight: 58.1 kg 57.8 kg   Weight change:  Body mass index is 23.31 kg/m.   Physical Exam: General exam: Pleasant, elderly Caucasian  female.  Not in physical distress Skin: No rashes, lesions or ulcers. HEENT: Atraumatic, normocephalic, no obvious bleeding Lungs: Diminished air entry bilaterally in the bases.  Mild cough on deep breathing. CVS: A-fib in 90s, no murmur GI/Abd soft, nontender, nondistended, bowel sound present CNS: Alert, awake, hard of hearing, mildly confused today.  Able to follow commands Psychiatry: Mood appropriate Extremities: No pedal edema, no calf tenderness  Data Review: I have personally reviewed the laboratory data and studies available.  F/u labs ordered Unresulted Labs (From admission, onward)     Start     Ordered   08/12/22 0500  CBC with Differential/Platelet  Daily at 5am,   R     Question:  Specimen collection method  Answer:  Lab=Lab collect   08/11/22 1019   08/12/22 0175  Basic metabolic panel  Daily at 5am,   R     Question:  Specimen collection method  Answer:  Lab=Lab collect   08/11/22 1019   08/11/22 0830  Heparin level (unfractionated)  Once-Timed,   TIMED       Question:  Specimen collection method  Answer:  Lab=Lab collect   08/10/22 2235   08/11/22 0752  T4, free  Add-on,   AD       Question:  Specimen collection method  Answer:  Lab=Lab collect   08/11/22 0751   08/11/22 0500  Procalcitonin  Daily at 5am,   R     Question:  Specimen collection method  Answer:  Lab=Lab collect   08/10/22 1751   08/10/22 1119  Urine Culture  Once,   URGENT       Question:  Indication  Answer:  Dysuria   08/10/22 1119            Signed, Terrilee Croak, MD [facility] 08/11/2022

## 2022-08-11 NOTE — Progress Notes (Signed)
Bilateral lower extremity venous duplex has been completed. Preliminary results can be found in CV Proc through chart review.   08/11/22 10:01 AM Erin Ryan RVT

## 2022-08-11 NOTE — TOC Benefit Eligibility Note (Signed)
Patient Teacher, English as a foreign language completed.    The patient is currently admitted and upon discharge could be taking Eliquis 5 mg.  The current 30 day co-pay is $47.00.   The patient is insured through [place], [place] Patient Advocate Specialist [place] Patient Advocate Team Direct Number: [phone]  Fax: [phone]

## 2022-08-11 NOTE — Evaluation (Signed)
Clinical/Bedside Swallow Evaluation Patient Details  Name: Erin Ryan MRN: [id number] Date of Birth: 1925-03-14  Today's Date: 08/11/2022 Time: SLP Start Time (ACUTE ONLY): 1140 SLP Stop Time (ACUTE ONLY): 1155 SLP Time Calculation (min) (ACUTE ONLY): 15 min  Past Medical History:  Past Medical History:  Diagnosis Date   Anxiety    Arthritis    COVID-19    04/2021   GERD (gastroesophageal reflux disease)    Hearing loss    has hearing aids   High cholesterol    Hypertension    Hypothyroid    Osteoporosis    Scoliosis    Thyroid disease    Past Surgical History:  Past Surgical History:  Procedure Laterality Date   CATARACT EXTRACTION     cysts removed from bilateral breasts     THYROIDECTOMY     HPI:  Patient is a 88 y.o. female with PMH: dementia, HTN, HLD, CKD, COPD, GERD, hearing loss, anxiety, arthritis, compression fracture who lives at home with her daughter. She presented to ED at [facility] on 08/10/22 with confusion and concern for UTI as well as daughter reporting patient to have several weeks of progressively worsening dyspnea on exertion. In ED, patient was afebrile, on RA, BP 130/53. She started to have chest pain and was noted to be in a-fib. CT angio chest showed an acute PE with small thrombus burden in the subsegmental branch of the left lower lobe with RV strain.  It also showed small patchy densities in both mid and both lower lung fields suggesting atelectasis/pneumonia, minimal bilateral pleural effusion R>L. CT head did not reveal any acute findings.    Assessment / Plan / Recommendation  Clinical Impression  Patient did not present with any overt s/s of dysphagia as per this bedside swallow evaluation, however it was limited to sips of liquids as patient politely declined anything else. Per her daughter, she had some mashed potatoes last night and did fine but she is going to try to get her to eat some more today. Patient consumed thin liquids  (iced coffee) via straw with timely swallow initiation observed, no overt s/s aspiration or penetration. No change in vitals or voice. SLP plans to f/u at least one more time to ensure she is tolerating solids as well, however recommend to contiue with current diet consistencies (regular solids, thin liquids). SLP Visit Diagnosis: Dysphagia, unspecified (R13.10)    Aspiration Risk  No limitations;Mild aspiration risk    Diet Recommendation Regular;Thin liquid   Liquid Administration via: Cup;Straw Medication Administration: Whole meds with liquid Supervision: Full supervision/cueing for compensatory strategies;Staff to assist with self feeding Compensations: Slow rate;Small sips/bites Postural Changes: Seated upright at 90 degrees;Remain upright for at least 30 minutes after po intake    Other  Recommendations Oral Care Recommendations: Oral care BID    Recommendations for follow up therapy are one component of a multi-disciplinary discharge planning process, led by the attending physician.  Recommendations may be updated based on patient status, additional functional criteria and insurance authorization.  Follow up Recommendations No SLP follow up      Assistance Recommended at Discharge    Functional Status Assessment Patient has had a recent decline in their functional status and demonstrates the ability to make significant improvements in function in a reasonable and predictable amount of time.  Frequency and Duration min 1 x/week  1 week       Prognosis Prognosis for Safe Diet Advancement: Good      Swallow  Study   General Date of Onset: 08/10/22 HPI: Patient is a 88 y.o. female with PMH: dementia, HTN, HLD, CKD, COPD, GERD, hearing loss, anxiety, arthritis, compression fracture who lives at home with her daughter. She presented to ED at [facility] on 08/10/22 with confusion and concern for UTI as well as daughter reporting patient to have several weeks of  progressively worsening dyspnea on exertion. In ED, patient was afebrile, on RA, BP 130/53. She started to have chest pain and was noted to be in a-fib. CT angio chest showed an acute PE with small thrombus burden in the subsegmental branch of the left lower lobe with RV strain.  It also showed small patchy densities in both mid and both lower lung fields suggesting atelectasis/pneumonia, minimal bilateral pleural effusion R>L. CT head did not reveal any acute findings. Type of Study: Bedside Swallow Evaluation Previous Swallow Assessment: none found Diet Prior to this Study: Regular;Thin liquids Temperature Spikes Noted: No Respiratory Status: Nasal cannula History of Recent Intubation: No Behavior/Cognition: Alert;Cooperative;Pleasant mood Oral Cavity Assessment: Within Functional Limits Oral Care Completed by SLP: No Oral Cavity - Dentition: Adequate natural dentition Vision: Functional for self-feeding Self-Feeding Abilities: Needs assist;Needs set up Patient Positioning: Upright in bed Baseline Vocal Quality: Normal Volitional Cough: Cognitively unable to elicit Volitional Swallow: Able to elicit    Oral/Motor/Sensory Function Overall Oral Motor/Sensory Function: Within functional limits   Ice Chips     Thin Liquid Thin Liquid: Within functional limits Presentation: Straw    Nectar Thick     Honey Thick     Puree Puree: Not tested   Solid     Solid: Not tested      Sonia Baller, MA, CCC-SLP Speech Therapy

## 2022-08-12 DIAGNOSIS — E43 Unspecified severe protein-calorie malnutrition: Secondary | ICD-10-CM | POA: Insufficient documentation

## 2022-08-12 DIAGNOSIS — I480 Paroxysmal atrial fibrillation: Secondary | ICD-10-CM | POA: Diagnosis not present

## 2022-08-12 LAB — CBC WITH DIFFERENTIAL/PLATELET
Abs Immature Granulocytes: 0.14 10*3/uL — ABNORMAL HIGH (ref 0.00–0.07)
Basophils Absolute: 0 10*3/uL (ref 0.0–0.1)
Basophils Relative: 0 %
Eosinophils Absolute: 0.1 10*3/uL (ref 0.0–0.5)
Eosinophils Relative: 1 %
HCT: 34.8 % — ABNORMAL LOW (ref 36.0–46.0)
Hemoglobin: 10.8 g/dL — ABNORMAL LOW (ref 12.0–15.0)
Immature Granulocytes: 1 %
Lymphocytes Relative: 11 %
Lymphs Abs: 1.6 10*3/uL (ref 0.7–4.0)
MCH: 28.4 pg (ref 26.0–34.0)
MCHC: 31 g/dL (ref 30.0–36.0)
MCV: 91.6 fL (ref 80.0–100.0)
Monocytes Absolute: 1.3 10*3/uL — ABNORMAL HIGH (ref 0.1–1.0)
Monocytes Relative: 9 %
Neutro Abs: 11.1 10*3/uL — ABNORMAL HIGH (ref 1.7–7.7)
Neutrophils Relative %: 78 %
Platelets: 227 10*3/uL (ref 150–400)
RBC: 3.8 MIL/uL — ABNORMAL LOW (ref 3.87–5.11)
RDW: 14.7 % (ref 11.5–15.5)
WBC: 14.2 10*3/uL — ABNORMAL HIGH (ref 4.0–10.5)
nRBC: 0 % (ref 0.0–0.2)

## 2022-08-12 LAB — BASIC METABOLIC PANEL
Anion gap: 9 (ref 5–15)
BUN: 48 mg/dL — ABNORMAL HIGH (ref 8–23)
CO2: 20 mmol/L — ABNORMAL LOW (ref 22–32)
Calcium: 7.4 mg/dL — ABNORMAL LOW (ref 8.9–10.3)
Chloride: 106 mmol/L (ref 98–111)
Creatinine, Ser: 1.36 mg/dL — ABNORMAL HIGH (ref 0.44–1.00)
GFR, Estimated: 36 mL/min — ABNORMAL LOW (ref >60)
Glucose, Bld: 120 mg/dL — ABNORMAL HIGH (ref 70–99)
Potassium: 3.8 mmol/L (ref 3.5–5.1)
Sodium: 135 mmol/L (ref 135–145)

## 2022-08-12 LAB — URINE CULTURE: Culture: >=100000 — AB

## 2022-08-12 LAB — PROCALCITONIN: Procalcitonin: 0.41 ng/mL

## 2022-08-12 LAB — HEPARIN LEVEL (UNFRACTIONATED)
Heparin Unfractionated: 0.28 IU/mL — ABNORMAL LOW (ref 0.30–0.70)
Heparin Unfractionated: 0.28 IU/mL — ABNORMAL LOW (ref 0.30–0.70)
Heparin Unfractionated: 0.26 IU/mL — ABNORMAL LOW (ref 0.30–0.70)

## 2022-08-12 MED ORDER — LEVALBUTEROL HCL 0.63 MG/3ML IN NEBU: 0.6300 mg | INHALATION_SOLUTION | Freq: Four times a day (QID) | RESPIRATORY_TRACT | Status: DC | PRN

## 2022-08-12 NOTE — Progress Notes (Signed)
ANTICOAGULATION CONSULT NOTE  Pharmacy Consult for Heparin Indication: pulmonary embolus  Allergies  Allergen Reactions   Ambrosia Trifida (Tall Ragweed) Allergy Skin Test Other (See Comments)    Other reaction(s): Other   Hydrochlorothiazide     hyponatremia   Lyrica [Pregabalin] Other (See Comments)    Severe confusion   Diltiazem Hcl Er Rash   Short Ragweed Pollen Ext     Other reaction(s): Other    Patient Measurements: Height: '5\' 2"'$  (157.5 cm) Weight: 57.8 kg (127 lb 6.8 oz) IBW/kg (Calculated) : 50.1 Heparin Dosing Weight: 57.2 kg  Vital Signs: Temp: 98.5 F (36.9 C) (11/21 0330) Temp Source: Axillary (11/21 0330) BP: 118/59 (11/21 0300) Pulse Rate: 141 (11/21 0300)  Labs: Recent Labs    08/10/22 1103 08/10/22 1330 08/10/22 1506 08/10/22 2147 08/11/22 0344 08/11/22 0931 08/11/22 2123 08/12/22 0324  HGB 11.8*  --   --   --  9.9*  --   --  10.8*  HCT 37.0  --   --   --  31.3*  --   --  34.8*  PLT 225  --   --   --  215  --   --  227  APTT 30  --   --  >200* 159*  --   --   --   LABPROT 13.9  --   --   --   --   --   --   --   INR 1.1  --   --   --   --   --   --   --   HEPARINUNFRC  --   --    < > >1.10* 1.07* >1.10* 0.32 0.28*  CREATININE 1.22*  --   --   --  1.28*  --   --  1.36*  TROPONINIHS 16 13  --   --   --   --   --   --    < > = values in this interval not displayed.     Estimated Creatinine Clearance: 19.6 mL/min (A) (by C-G formula based on SCr of 1.36 mg/dL (H)).   Medications:  Medications Prior to Admission  Medication Sig Dispense Refill Last Dose   amLODipine (NORVASC) 2.5 MG tablet Take 2.5 mg by mouth daily.   Past Week   aspirin 81 MG tablet Take 81 mg by mouth daily.   Past Week   atorvastatin (LIPITOR) 40 MG tablet Take 40 mg by mouth daily.   Past Week   Calcium Citrate-Vitamin D (CALCIUM CITRATE + PO) Take 1 tablet 2 (two) times daily by mouth.   Past Week   denosumab (PROLIA) 60 MG/ML SOSY injection Inject 60 mg into the  skin every 6 (six) months.   Unk   Diclofenac Sodium (VOLTAREN EX) Apply 1 Application topically as needed (pain).   Past Month   donepezil (ARICEPT) 10 MG tablet Take 1 tablet (10 mg total) by mouth at bedtime. 90 tablet 3 Past Week   DULoxetine (CYMBALTA) 30 MG capsule Take 30 mg by mouth daily.   Past Week   furosemide (LASIX) 20 MG tablet Take 1 tablet (20 mg total) by mouth every other day. 90 tablet 2 Past Week   gabapentin (NEURONTIN) 100 MG capsule Take 100 mg by mouth at bedtime.   Past Week   levothyroxine (SYNTHROID) 125 MCG tablet Take 125 mcg by mouth in the morning. DAW1 Synthroid   Past Week   mirtazapine (REMERON) 15 MG tablet Take 15  mg by mouth at bedtime.   Past Week   Multiple Vitamins-Minerals (CENTRUM SILVER PO) Take 1 tablet by mouth daily.   Past Week   omeprazole (PRILOSEC) 40 MG capsule Take 1 capsule (40 mg total) by mouth 2 (two) times daily before a meal. (Patient taking differently: Take 40 mg by mouth daily.) 180 capsule 3 Past Week   metoprolol tartrate (LOPRESSOR) 25 MG tablet TAKE 1 TABLET BY MOUTH TWICE A DAY (Patient taking differently: Take 25 mg by mouth 2 (two) times daily.) 180 tablet 3 08/09/2022 at Night    Scheduled:   albuterol  2.5 mg Nebulization TID   atorvastatin  40 mg Oral QHS   Chlorhexidine Gluconate Cloth  6 each Topical Daily   donepezil  10 mg Oral QHS   DULoxetine  30 mg Oral Daily   gabapentin  100 mg Oral QHS   lactose free nutrition  237 mL Oral TID BM   metoprolol tartrate  50 mg Oral BID   mirtazapine  15 mg Oral QHS   pantoprazole  40 mg Oral Daily   senna  1 tablet Oral BID   Infusions:   amiodarone 30 mg/hr (08/12/22 0119)   ampicillin-sulbactam (UNASYN) IV 3 g (08/12/22 0332)   heparin 600 Units/hr (08/12/22 0000)   PRN: acetaminophen **OR** acetaminophen, hydrALAZINE, LORazepam, nitroGLYCERIN, mouth rinse, oxyCODONE, polyethylene glycol, traZODone  Assessment: 89 yof with a history of HLD, HTN. Found to have new LLL  PE on CTA with RH strain (may be chronic). Pharmacy consulted to dose IV heparin. Not on anticoagulants PTA.  Today, 08/12/2022: Heparin level 0.28- now sub-therapeutic on IV heparin 600 units/hr CBC: Hg low but improved, pltc WNL No bleeding or infusion issues per RN  Goal of Therapy:  Heparin level 0.3-0.7 units/ml Monitor platelets by anticoagulation protocol: Yes   Plan:  Increase heparin infusion to 700 units/hr Check confirmatory heparin level in 8h hr  Daily CBC & heparin level Monitor for s/s of bleeding or worsening thrombosis  Netta Cedars, PharmD, BCPS 08/12/2022, 5:36 AM

## 2022-08-12 NOTE — Progress Notes (Signed)
ANTICOAGULATION CONSULT NOTE  Pharmacy Consult for Heparin Indication: pulmonary embolus  Allergies  Allergen Reactions   Ambrosia Trifida (Tall Ragweed) Allergy Skin Test Other (See Comments)    Other reaction(s): Other   Hydrochlorothiazide     hyponatremia   Lyrica [Pregabalin] Other (See Comments)    Severe confusion   Diltiazem Hcl Er Rash   Short Ragweed Pollen Ext     Other reaction(s): Other    Patient Measurements: Height: '5\' 2"'$  (157.5 cm) Weight: 57.8 kg (127 lb 6.8 oz) IBW/kg (Calculated) : 50.1 Heparin Dosing Weight: 57.2 kg  Vital Signs: Temp: 97.7 F (36.5 C) (11/21 2300) Temp Source: Oral (11/21 2300) BP: 121/74 (11/21 2300) Pulse Rate: 103 (11/21 2300)  Labs: Recent Labs    08/10/22 1103 08/10/22 1330 08/10/22 1506 08/10/22 2147 08/11/22 0344 08/11/22 0931 08/12/22 0324 08/12/22 1343 08/12/22 2236  HGB 11.8*  --   --   --  9.9*  --  10.8*  --   --   HCT 37.0  --   --   --  31.3*  --  34.8*  --   --   PLT 225  --   --   --  215  --  227  --   --   APTT 30  --   --  >200* 159*  --   --   --   --   LABPROT 13.9  --   --   --   --   --   --   --   --   INR 1.1  --   --   --   --   --   --   --   --   HEPARINUNFRC  --   --    < > >1.10* 1.07*   < > 0.28* 0.28* 0.26*  CREATININE 1.22*  --   --   --  1.28*  --  1.36*  --   --   TROPONINIHS 16 13  --   --   --   --   --   --   --    < > = values in this interval not displayed.     Estimated Creatinine Clearance: 19.6 mL/min (A) (by C-G formula based on SCr of 1.36 mg/dL (H)).   Medications:  Medications Prior to Admission  Medication Sig Dispense Refill Last Dose   amLODipine (NORVASC) 2.5 MG tablet Take 2.5 mg by mouth daily.   Past Week   aspirin 81 MG tablet Take 81 mg by mouth daily.   Past Week   atorvastatin (LIPITOR) 40 MG tablet Take 40 mg by mouth daily.   Past Week   Calcium Citrate-Vitamin D (CALCIUM CITRATE + PO) Take 1 tablet 2 (two) times daily by mouth.   Past Week   denosumab  (PROLIA) 60 MG/ML SOSY injection Inject 60 mg into the skin every 6 (six) months.   Unk   Diclofenac Sodium (VOLTAREN EX) Apply 1 Application topically as needed (pain).   Past Month   donepezil (ARICEPT) 10 MG tablet Take 1 tablet (10 mg total) by mouth at bedtime. 90 tablet 3 Past Week   DULoxetine (CYMBALTA) 30 MG capsule Take 30 mg by mouth daily.   Past Week   furosemide (LASIX) 20 MG tablet Take 1 tablet (20 mg total) by mouth every other day. 90 tablet 2 Past Week   gabapentin (NEURONTIN) 100 MG capsule Take 100 mg by mouth at bedtime.   Past Week  levothyroxine (SYNTHROID) 125 MCG tablet Take 125 mcg by mouth in the morning. DAW1 Synthroid   Past Week   mirtazapine (REMERON) 15 MG tablet Take 15 mg by mouth at bedtime.   Past Week   Multiple Vitamins-Minerals (CENTRUM SILVER PO) Take 1 tablet by mouth daily.   Past Week   omeprazole (PRILOSEC) 40 MG capsule Take 1 capsule (40 mg total) by mouth 2 (two) times daily before a meal. (Patient taking differently: Take 40 mg by mouth daily.) 180 capsule 3 Past Week   metoprolol tartrate (LOPRESSOR) 25 MG tablet TAKE 1 TABLET BY MOUTH TWICE A DAY (Patient taking differently: Take 25 mg by mouth 2 (two) times daily.) 180 tablet 3 08/09/2022 at Night    Scheduled:   atorvastatin  40 mg Oral QHS   Chlorhexidine Gluconate Cloth  6 each Topical Daily   donepezil  10 mg Oral QHS   DULoxetine  30 mg Oral Daily   gabapentin  100 mg Oral QHS   lactose free nutrition  237 mL Oral TID BM   metoprolol tartrate  50 mg Oral BID   mirtazapine  15 mg Oral QHS   pantoprazole  40 mg Oral Daily   senna  1 tablet Oral BID   Infusions:   amiodarone 30 mg/hr (08/12/22 2221)   ampicillin-sulbactam (UNASYN) IV Stopped (08/12/22 1544)   heparin 750 Units/hr (08/12/22 2344)   PRN: acetaminophen **OR** acetaminophen, hydrALAZINE, levalbuterol, LORazepam, nitroGLYCERIN, mouth rinse, oxyCODONE, polyethylene glycol, traZODone  Assessment: 89 yof with a history  of HLD, HTN. Found to have new LLL PE on CTA with RH strain (may be chronic). Pharmacy consulted to dose IV heparin. Not on anticoagulants PTA.  Today, 08/12/2022: Heparin level 0.26- remains slightly low./decreased despite increase in heparin rate to 750 units/hr CBC: Hg low but improved, pltc WNL No bleeding or infusion issues per RN  Goal of Therapy:  Heparin level 0.3-0.7 units/ml Monitor platelets by anticoagulation protocol: Yes   Plan:  Give small heparin bolus 1000 units IV x1 Increase heparin infusion to 850 units/hr Recheck confirmatory heparin level in 8 hr  Daily CBC & heparin level Monitor for s/s of bleeding or worsening thrombosis F/u plans for long-term anticoagulation (Eliquis preferred over Xarelto d/t renal impairment)  Netta Cedars, PharmD, BCPS 08/12/2022, 11:59 PM

## 2022-08-12 NOTE — Progress Notes (Signed)
ANTICOAGULATION CONSULT NOTE  Pharmacy Consult for Heparin Indication: pulmonary embolus  Allergies  Allergen Reactions   Ambrosia Trifida (Tall Ragweed) Allergy Skin Test Other (See Comments)    Other reaction(s): Other   Hydrochlorothiazide     hyponatremia   Lyrica [Pregabalin] Other (See Comments)    Severe confusion   Diltiazem Hcl Er Rash   Short Ragweed Pollen Ext     Other reaction(s): Other    Patient Measurements: Height: '5\' 2"'$  (157.5 cm) Weight: 57.8 kg (127 lb 6.8 oz) IBW/kg (Calculated) : 50.1 Heparin Dosing Weight: 57.2 kg  Vital Signs: Temp: 98.4 F (36.9 C) (11/20 2300) Temp Source: Oral (11/20 2300) BP: 114/61 (11/20 2300) Pulse Rate: 139 (11/20 2300)  Labs: Recent Labs    08/10/22 1103 08/10/22 1330 08/10/22 1506 08/10/22 2147 08/11/22 0344 08/11/22 0931 08/11/22 2123  HGB 11.8*  --   --   --  9.9*  --   --   HCT 37.0  --   --   --  31.3*  --   --   PLT 225  --   --   --  215  --   --   APTT 30  --   --  >200* 159*  --   --   LABPROT 13.9  --   --   --   --   --   --   INR 1.1  --   --   --   --   --   --   HEPARINUNFRC  --   --    < > >1.10* 1.07* >1.10* 0.32  CREATININE 1.22*  --   --   --  1.28*  --   --   TROPONINIHS 16 13  --   --   --   --   --    < > = values in this interval not displayed.     Estimated Creatinine Clearance: 20.8 mL/min (A) (by C-G formula based on SCr of 1.28 mg/dL (H)).   Medications:  Medications Prior to Admission  Medication Sig Dispense Refill Last Dose   amLODipine (NORVASC) 2.5 MG tablet Take 2.5 mg by mouth daily.   Past Week   aspirin 81 MG tablet Take 81 mg by mouth daily.   Past Week   atorvastatin (LIPITOR) 40 MG tablet Take 40 mg by mouth daily.   Past Week   Calcium Citrate-Vitamin D (CALCIUM CITRATE + PO) Take 1 tablet 2 (two) times daily by mouth.   Past Week   denosumab (PROLIA) 60 MG/ML SOSY injection Inject 60 mg into the skin every 6 (six) months.   Unk   Diclofenac Sodium (VOLTAREN EX)  Apply 1 Application topically as needed (pain).   Past Month   donepezil (ARICEPT) 10 MG tablet Take 1 tablet (10 mg total) by mouth at bedtime. 90 tablet 3 Past Week   DULoxetine (CYMBALTA) 30 MG capsule Take 30 mg by mouth daily.   Past Week   furosemide (LASIX) 20 MG tablet Take 1 tablet (20 mg total) by mouth every other day. 90 tablet 2 Past Week   gabapentin (NEURONTIN) 100 MG capsule Take 100 mg by mouth at bedtime.   Past Week   levothyroxine (SYNTHROID) 125 MCG tablet Take 125 mcg by mouth in the morning. DAW1 Synthroid   Past Week   mirtazapine (REMERON) 15 MG tablet Take 15 mg by mouth at bedtime.   Past Week   Multiple Vitamins-Minerals (CENTRUM SILVER PO) Take 1 tablet  by mouth daily.   Past Week   omeprazole (PRILOSEC) 40 MG capsule Take 1 capsule (40 mg total) by mouth 2 (two) times daily before a meal. (Patient taking differently: Take 40 mg by mouth daily.) 180 capsule 3 Past Week   metoprolol tartrate (LOPRESSOR) 25 MG tablet TAKE 1 TABLET BY MOUTH TWICE A DAY (Patient taking differently: Take 25 mg by mouth 2 (two) times daily.) 180 tablet 3 08/09/2022 at Night    Scheduled:   albuterol  2.5 mg Nebulization TID   atorvastatin  40 mg Oral QHS   Chlorhexidine Gluconate Cloth  6 each Topical Daily   donepezil  10 mg Oral QHS   DULoxetine  30 mg Oral Daily   gabapentin  100 mg Oral QHS   lactose free nutrition  237 mL Oral TID BM   metoprolol tartrate  50 mg Oral BID   mirtazapine  15 mg Oral QHS   pantoprazole  40 mg Oral Daily   senna  1 tablet Oral BID   Infusions:   amiodarone 60 mg/hr (08/11/22 2215)   Followed by   amiodarone     ampicillin-sulbactam (UNASYN) IV Stopped (08/11/22 1813)   heparin 600 Units/hr (08/11/22 1430)   PRN: acetaminophen **OR** acetaminophen, hydrALAZINE, LORazepam, nitroGLYCERIN, mouth rinse, oxyCODONE, polyethylene glycol, traZODone  Assessment: 89 yof with a history of HLD, HTN. Found to have new LLL PE on CTA with RH strain (may be  chronic). Pharmacy consulted to dose IV heparin. Not on anticoagulants PTA.  Today, 08/12/2022: Heparin level 0.32- now therapeutic on reduced heparin rate No bleeding or infusion issues per RN  Goal of Therapy:  Heparin level 0.3-0.7 units/ml Monitor platelets by anticoagulation protocol: Yes   Plan:  Continue heparin drip at 600 units/hr Check confirmatory heparin level in 8h hr (~0500) Daily CBC & heparin level Monitor for s/s of bleeding or worsening thrombosis  Netta Cedars, PharmD, BCPS 08/12/2022, 12:02 AM

## 2022-08-12 NOTE — Consult Note (Addendum)
Cardiology Consultation   Patient ID: Erin Ryan MRN: [id number]; DOB: 04-15-1925  Admit date: 08/10/2022 Date of Consult: 08/12/2022  PCP:  Harlan Stains, MD   [place] [facility] Cardiologist:  Dr Percival Spanish    Patient Profile:   Erin Ryan is a 88 y.o. female with a hx of dementia, HTN, HLD, CKD, GERD, tachycardia, chronic dyspnea, mild COPD, hypothyroidism, hearing loss, anxiety,  who is being seen 08/12/2022 for the evaluation of A fib RVR at the request of Dr Pietro Cassis.  History of Present Illness:   Erin Ryan follows Dr Percival Spanish outpatient for HTN and tachycardia, c/o chronic dyspnea that has been progressing over the years. She sees pulmonology, was diagnosed with mild COPD in 2014 that later had improved on PFTs. Most recent Echo from 08/11/22 (during current hospitalization) showed LVEF 55-60%, no RWMA, mild LVH, grade II DD, low normal RV, mildly elevated PSAP with RVSP 36.9 mmHg, mild MR, moderate AI. She was seen last in the office 10/14/21, was doing well,  and felt no further workup needed for chronic dyspnea and BP was well controlled.   She presented to ER on 08/10/22 for confusion with urinary symptoms. She had complained chest pain at ED, EKG showed A fib RVR with ventricular  rate up to 160s. She was given IV metoprolol bolus where she spontaneously converted to SR. Admission labs showed elevated Cr 1.22 and GFR 41. BNP 523. Hs trop 16>13. WBC 12500.   UA concerning for UTI. CTH no acute findings. CT chest showed  acute PE with small thrombus burden in subsegmental branch in left lower lobe. There is right ventricular strain which may be chronic. RV LV ratio is 1.4.There are small patchy densities in both mid and both lower lung fields suggesting atelectasis/pneumonia. She was admitted for acute PE, pneumonia, AKI, and  UTI, started on heparin gtt and antibiotic. She had recurrent A fib RVR later where she was re-bolus with metoprolol and given PTA metoprolol  '25mg'$  BID. Echo done on 08/11/22 as mentioned above. She continue to have difficulty with rate control, reportedly allergic to Cardizem, started on amiodarone gtt by hospitalist team 11/20 night.  Cardiology is consulted today for A fib RVR.   She is a poor historian, denied any complaints such as chest pain. Her daughter is with her, states she had bad memory, but more confused over the past few days, much weaker than usual. She was not aware of A fib for the patient in the past. She states patient did not have cardiac complaints at home.      Past Medical History:  Diagnosis Date   Anxiety    Arthritis    COVID-19    04/2021   GERD (gastroesophageal reflux disease)    Hearing loss    has hearing aids   High cholesterol    Hypertension    Hypothyroid    Osteoporosis    Scoliosis    Thyroid disease     Past Surgical History:  Procedure Laterality Date   CATARACT EXTRACTION     cysts removed from bilateral breasts     THYROIDECTOMY       Home Medications:  Prior to Admission medications   Medication Sig Start Date End Date Taking? Authorizing Provider  amLODipine (NORVASC) 2.5 MG tablet Take 2.5 mg by mouth daily.   Yes [provider]  aspirin 81 MG tablet Take 81 mg by mouth daily.   Yes [provider]  atorvastatin (LIPITOR) 40 MG tablet Take  40 mg by mouth daily.   Yes [provider]  Calcium Citrate-Vitamin D (CALCIUM CITRATE + PO) Take 1 tablet 2 (two) times daily by mouth.   Yes [provider]  denosumab (PROLIA) 60 MG/ML SOSY injection Inject 60 mg into the skin every 6 (six) months. 06/24/17  Yes [provider]  Diclofenac Sodium (VOLTAREN EX) Apply 1 Application topically as needed (pain).   Yes [provider]  donepezil (ARICEPT) 10 MG tablet Take 1 tablet (10 mg total) by mouth at bedtime. 05/20/22  Yes Ward Givens, NP  DULoxetine (CYMBALTA) 30 MG capsule Take 30 mg by mouth daily. 09/06/21  Yes [provider]  furosemide (LASIX) 20 MG tablet Take 1 tablet (20 mg total) by mouth every other day. 07/26/21 08/11/22 Yes Lendon Colonel, NP  gabapentin (NEURONTIN) 100 MG capsule Take 100 mg by mouth at bedtime. 05/07/22  Yes [provider]  levothyroxine (SYNTHROID) 125 MCG tablet Take 125 mcg by mouth in the morning. DAW1 Synthroid   Yes [provider]  mirtazapine (REMERON) 15 MG tablet Take 15 mg by mouth at bedtime. 10/08/21  Yes [provider]  Multiple Vitamins-Minerals (CENTRUM SILVER PO) Take 1 tablet by mouth daily.   Yes [provider]  omeprazole (PRILOSEC) 40 MG capsule Take 1 capsule (40 mg total) by mouth 2 (two) times daily before a meal. Patient taking differently: Take 40 mg by mouth daily. 09/11/17  Yes Tanda Rockers, MD  metoprolol tartrate (LOPRESSOR) 25 MG tablet TAKE 1 TABLET BY MOUTH TWICE A DAY Patient taking differently: Take 25 mg by mouth 2 (two) times daily. 12/30/21   Minus Breeding, MD    Inpatient Medications: Scheduled Meds:  atorvastatin  40 mg Oral QHS   Chlorhexidine Gluconate Cloth  6 each Topical Daily   donepezil  10 mg Oral QHS   DULoxetine  30 mg Oral Daily   gabapentin  100 mg Oral QHS   lactose free nutrition  237 mL Oral TID BM   metoprolol tartrate  50 mg Oral BID   mirtazapine  15 mg Oral QHS   pantoprazole  40 mg Oral Daily   senna  1 tablet Oral BID   Continuous Infusions:  amiodarone 30 mg/hr (08/12/22 1200)   ampicillin-sulbactam (UNASYN) IV Stopped (08/12/22 0402)   heparin 700 Units/hr (08/12/22 1200)   PRN Meds: acetaminophen **OR** acetaminophen, hydrALAZINE, levalbuterol, LORazepam, nitroGLYCERIN, mouth rinse, oxyCODONE, polyethylene glycol, traZODone  Allergies:    Allergies  Allergen Reactions   Ambrosia Trifida (Tall Ragweed) Allergy Skin Test Other (See Comments)    Other reaction(s): Other   Hydrochlorothiazide     hyponatremia   Lyrica [Pregabalin] Other (See Comments)     Severe confusion   Diltiazem Hcl Er Rash   Short Ragweed Pollen Ext     Other reaction(s): Other    Social History:   Social History   Socioeconomic History   Marital status: Widowed    Spouse name: Not on file   Number of children: 1   Years of education: Not on file   Highest education level: Not on file  Occupational History   Occupation: Retired    Fish farm manager: RETIRED    Comment: Scientist, research (physical sciences)  Tobacco Use   Smoking status: Never   Smokeless tobacco: Never  Vaping Use   Vaping Use: Never used  Substance and Sexual Activity   Alcohol use: No   Drug use: No   Sexual activity: Not on file  Other Topics Concern   Not on file  Social History Narrative   Lives with daughter.    Has 1 daughter, Erin Ryan, and 2 grandkids   Social Determinants of Health   Financial Resource Strain: Not on file  Food Insecurity: Not on file  Transportation Needs: Not on file  Physical Activity: Not on file  Stress: Not on file  Social Connections: Not on file  Intimate Partner Violence: Not on file    Family History:    Family History  Problem Relation Age of Onset   Heart disease Mother 40       Heart attack   Bladder Cancer Mother    Heart disease Father    Asthma Father    Heart attack Brother        Age undetermined     ROS:  Unable to obtain due to dementia  Physical Exam/Data:   Vitals:   08/12/22 0900 08/12/22 1000 08/12/22 1100 08/12/22 1200  BP: 137/70 (!) 144/91 137/62 135/69  Pulse: (!) 134 83 92 72  Resp: '18 18 18 '$ (!) 21  Temp:    98.3 F (36.8 C)  TempSrc:    Oral  SpO2: 91% (!) 88% 91% 92%  Weight:      Height:        Intake/Output Summary (Last 24 hours) at 08/12/2022 1504 Last data filed at 08/12/2022 1200 Gross per 24 hour  Intake 2067.1 ml  Output 550 ml  Net 1517.1 ml      08/10/2022    5:11 PM 08/10/2022   10:45 AM 05/20/2022    9:22 AM  Last 3 Weights  Weight (lbs) 127 lb 6.8 oz 128 lb 120 lb  Weight (kg) 57.8 kg 58.06 kg 54.432  kg     Body mass index is 23.31 kg/m.  Vitals:  Vitals:   08/12/22 1100 08/12/22 1200  BP: 137/62 135/69  Pulse: 92 72  Resp: 18 (!) 21  Temp:  98.3 F (36.8 C)  SpO2: 91% 92%   General Appearance: In no apparent distress, laying in bed, frail elderly  HEENT: Normocephalic, atraumatic. HOH Neck: Supple, trachea midline, no JVDs Cardiovascular: Irregularly irregular, no murmur  Respiratory: Resting breathing unlabored, lungs sounds clear to auscultation bilaterally, no use of accessory muscles. [place] oxygen, intermittent coughing noted  Gastrointestinal: Bowel sounds positive, abdomen soft Extremities: Able to move all extremities in bed without difficulty, trace edema of BLE Genitourinary: Genital exam not performed Musculoskeletal:Generalized muscle wasting  Skin: Intact, warm, dry. No rashes or petechiae noted in exposed areas.  Neurologic: Alert, oriented to person, cognitive and memory impaired Psychiatric: Normal affect. Mood is appropriate.       EKG:  The EKG was personally reviewed and demonstrates:  EKG from 08/11/22 with A fib RVR 160 bpm   Telemetry:  Telemetry was personally reviewed and demonstrates: Sinus rhythm and A fib RVR intermittently, PACs   Relevant CV Studies:  Echo from 08/11/22:   1. Left ventricular ejection fraction, by estimation, is 55 to 60%. The  left ventricle has normal function. The left ventricle has no regional  wall motion abnormalities. There is mild asymmetric left ventricular  hypertrophy of the basal-septal segment.  Left ventricular diastolic parameters are consistent with Grade II  diastolic dysfunction (pseudonormalization). Elevated left ventricular  end-diastolic pressure.   2. Right ventricular systolic function is low normal. The right  ventricular size is mildly enlarged. There is mildly elevated pulmonary  artery systolic pressure.   3. Right atrial  size was mildly dilated.   4. The mitral valve is grossly normal.  Mild mitral valve regurgitation.  Mild mitral stenosis. Moderate mitral annular calcification.   5. The aortic valve is tricuspid. There is mild thickening of the aortic  valve. Aortic valve regurgitation is moderate. Aortic valve sclerosis is  present, with no evidence of aortic valve stenosis.    Laboratory Data:  High Sensitivity Troponin:   Recent Labs  Lab 08/10/22 1103 08/10/22 1330  TROPONINIHS 16 13     Chemistry Recent Labs  Lab 08/10/22 1103 08/11/22 0344 08/12/22 0324  NA 135 138 135  K 4.0 4.1 3.8  CL 102 107 106  CO2 24 22 20*  GLUCOSE 139* 122* 120*  BUN 51* 45* 48*  CREATININE 1.22* 1.28* 1.36*  CALCIUM 7.9* 7.6* 7.4*  GFRNONAA 41* 39* 36*  ANIONGAP '9 9 9    '$ Recent Labs  Lab 08/10/22 1103  PROT 6.5  ALBUMIN 2.8*  AST 28  ALT 18  ALKPHOS 64  BILITOT 0.4   Lipids No results for input(s): "CHOL", "TRIG", "HDL", "LABVLDL", "LDLCALC", "CHOLHDL" in the last 168 hours.  Hematology Recent Labs  Lab 08/10/22 1103 08/11/22 0344 08/12/22 0324  WBC 12.5* 16.0* 14.2*  RBC 4.15 3.42* 3.80*  HGB 11.8* 9.9* 10.8*  HCT 37.0 31.3* 34.8*  MCV 89.2 91.5 91.6  MCH 28.4 28.9 28.4  MCHC 31.9 31.6 31.0  RDW 14.2 14.6 14.7  PLT 225 215 227   Thyroid  Recent Labs  Lab 08/11/22 0344 08/11/22 0931  TSH <0.010*  --   FREET4  --  2.04*    BNP Recent Labs  Lab 08/10/22 1103  BNP 523.6*    DDimer No results for input(s): "DDIMER" in the last 168 hours.   Radiology/Studies:  VAS [place] LOWER EXTREMITY VENOUS (DVT)  Result Date: 08/11/2022  Lower Venous DVT Study Patient Name:  Erin Ryan  Date of Exam:   08/11/2022 Medical Rec #: [id number]      Accession #:    [id number] Date of Birth: August 24, 1925       Patient Gender: F Patient Age:   82 years Exam Location:  [facility] Procedure:      VAS [place] LOWER EXTREMITY VENOUS (DVT) Referring Phys: Terrilee Croak --------------------------------------------------------------------------------  Indications:  Pulmonary embolism.  Risk Factors: None identified. Comparison Study: No prior studies. Performing Technologist: Oliver Hum RVT  Examination Guidelines: A complete evaluation includes B-mode imaging, spectral Doppler, color Doppler, and power Doppler as needed of all accessible portions of each vessel. Bilateral testing is considered an integral part of a complete examination. Limited examinations for reoccurring indications may be performed as noted. The reflux portion of the exam is performed with the patient in reverse Trendelenburg.  +---------+---------------+---------+-----------+----------+--------------+ RIGHT    CompressibilityPhasicitySpontaneityPropertiesThrombus Aging +---------+---------------+---------+-----------+----------+--------------+ CFV      Full           Yes      Yes                                 +---------+---------------+---------+-----------+----------+--------------+ SFJ      Full                                                        +---------+---------------+---------+-----------+----------+--------------+ FV  Prox  Full                                                        +---------+---------------+---------+-----------+----------+--------------+ FV Mid   Full                                                        +---------+---------------+---------+-----------+----------+--------------+ FV DistalFull                                                        +---------+---------------+---------+-----------+----------+--------------+ PFV      Full                                                        +---------+---------------+---------+-----------+----------+--------------+ POP      Full           Yes      Yes                                 +---------+---------------+---------+-----------+----------+--------------+ PTV      Full                                                         +---------+---------------+---------+-----------+----------+--------------+ PERO     Full                                                        +---------+---------------+---------+-----------+----------+--------------+   +---------+---------------+---------+-----------+----------+--------------+ LEFT     CompressibilityPhasicitySpontaneityPropertiesThrombus Aging +---------+---------------+---------+-----------+----------+--------------+ CFV      Full           Yes      Yes                                 +---------+---------------+---------+-----------+----------+--------------+ SFJ      Full                                                        +---------+---------------+---------+-----------+----------+--------------+ FV Prox  Full                                                        +---------+---------------+---------+-----------+----------+--------------+  FV Mid   Full                                                        +---------+---------------+---------+-----------+----------+--------------+ FV DistalFull           Yes      Yes                                 +---------+---------------+---------+-----------+----------+--------------+ PFV      Full                                                        +---------+---------------+---------+-----------+----------+--------------+ POP      Full           Yes      Yes                                 +---------+---------------+---------+-----------+----------+--------------+ PTV      Full                                                        +---------+---------------+---------+-----------+----------+--------------+ PERO     Full                                                        +---------+---------------+---------+-----------+----------+--------------+     Summary: RIGHT: - There is no evidence of deep vein thrombosis in the lower extremity. However, portions of this  examination were limited- see technologist comments above.  - No cystic structure found in the popliteal fossa.  LEFT: - There is no evidence of deep vein thrombosis in the lower extremity. However, portions of this examination were limited- see technologist comments above.  - No cystic structure found in the popliteal fossa.  *See table(s) above for measurements and observations. Electronically signed by Servando Snare MD on 08/11/2022 at 5:30:34 PM.    Final    ECHOCARDIOGRAM COMPLETE  Result Date: 08/11/2022    ECHOCARDIOGRAM REPORT   Patient Name:   Erin Ryan Date of Exam: 08/11/2022 Medical Rec #:  [id number]     Height:       62.0 in Accession #:    [id number]    Weight:       127.4 lb Date of Birth:  05/22/1925      BSA:          1.578 m Patient Age:    89 years      BP:           133/44 mmHg Patient Gender: F             HR:           89 bpm. Exam Location:  Inpatient Procedure:  2D Echo, 3D Echo, Cardiac Doppler and Color Doppler Indications:    Abnormal ECG 794.31 / R94.31  History:        Patient has prior history of Echocardiogram examinations, most                 recent 03/01/2021. COPD; Risk Factors:Hypertension, Dyslipidemia                 and Former Smoker. Choronic kidney disease. Acute pulmonary                 embolism. History of A-fib with RVR. Mild bilateral pneumonia.  Sonographer:    Darlina Sicilian RDCS Referring Phys: [id number] [place]  1. Left ventricular ejection fraction, by estimation, is 55 to 60%. The left ventricle has normal function. The left ventricle has no regional wall motion abnormalities. There is mild asymmetric left ventricular hypertrophy of the basal-septal segment. Left ventricular diastolic parameters are consistent with Grade II diastolic dysfunction (pseudonormalization). Elevated left ventricular end-diastolic pressure.  2. Right ventricular systolic function is low normal. The right ventricular size is mildly enlarged. There is mildly elevated  pulmonary artery systolic pressure.  3. Right atrial size was mildly dilated.  4. The mitral valve is grossly normal. Mild mitral valve regurgitation. Mild mitral stenosis. Moderate mitral annular calcification.  5. The aortic valve is tricuspid. There is mild thickening of the aortic valve. Aortic valve regurgitation is moderate. Aortic valve sclerosis is present, with no evidence of aortic valve stenosis. FINDINGS  Left Ventricle: Left ventricular ejection fraction, by estimation, is 55 to 60%. The left ventricle has normal function. The left ventricle has no regional wall motion abnormalities. The left ventricular internal cavity size was normal in size. There is  mild asymmetric left ventricular hypertrophy of the basal-septal segment. Left ventricular diastolic parameters are consistent with Grade II diastolic dysfunction (pseudonormalization). Elevated left ventricular end-diastolic pressure. Right Ventricle: The right ventricular size is mildly enlarged. No increase in right ventricular wall thickness. Right ventricular systolic function is low normal. There is mildly elevated pulmonary artery systolic pressure. The tricuspid regurgitant velocity is 2.91 m/s, and with an assumed right atrial pressure of 3 mmHg, the estimated right ventricular systolic pressure is 54.0 mmHg. Left Atrium: Left atrial size was normal in size. Right Atrium: Right atrial size was mildly dilated. Pericardium: Trivial pericardial effusion is present. The pericardial effusion is posterior to the left ventricle. Mitral Valve: The mitral valve is grossly normal. There is mild thickening of the mitral valve leaflet(s). Moderate mitral annular calcification located on the posterior annulus. Mild mitral valve regurgitation. Mild mitral valve stenosis. MV peak gradient, 7.3 mmHg. The mean mitral valve gradient is 4.0 mmHg with average heart rate of 98 bpm. Tricuspid Valve: The tricuspid valve is grossly normal. Tricuspid valve  regurgitation is mild . No evidence of tricuspid stenosis. Aortic Valve: The aortic valve is tricuspid. There is mild thickening of the aortic valve. There is mild aortic valve annular calcification. Aortic valve regurgitation is moderate. Aortic valve sclerosis is present, with no evidence of aortic valve stenosis. Pulmonic Valve: The pulmonic valve was grossly normal. Pulmonic valve regurgitation is not visualized. No evidence of pulmonic stenosis. Aorta: The aortic root is normal in size and structure. IAS/Shunts: The atrial septum is grossly normal.  LEFT VENTRICLE PLAX 2D LVIDd:         3.30 cm   Diastology LVIDs:         1.60 cm   LV e' medial:  5.87 cm/s LV PW:         0.80 cm   LV E/e' medial:  26.7 LV IVS:        0.90 cm   LV e' lateral:   7.07 cm/s LVOT diam:     2.00 cm   LV E/e' lateral: 22.2 LV SV:         40 LV SV Index:   25 LVOT Area:     3.14 cm                           3D Volume EF:                          3D EF:        58 %                          LV EDV:       75 ml                          LV ESV:       32 ml                          LV SV:        43 ml RIGHT VENTRICLE RV Basal diam:  3.20 cm RV Mid diam:    2.40 cm RV S prime:     11.40 cm/s TAPSE (M-mode): 1.3 cm LEFT ATRIUM             Index        RIGHT ATRIUM           Index LA diam:        3.40 cm 2.15 cm/m   RA Area:     11.10 cm LA Vol (A2C):   32.1 ml 20.34 ml/m  RA Volume:   20.50 ml  12.99 ml/m LA Vol (A4C):   44.5 ml 28.19 ml/m LA Biplane Vol: 39.2 ml 24.84 ml/m  AORTIC VALVE LVOT Vmax:   66.17 cm/s LVOT Vmean:  41.962 cm/s LVOT VTI:    0.127 m  AORTA Ao Root diam: 3.10 cm Ao Asc diam:  2.80 cm MITRAL VALVE                TRICUSPID VALVE MV Area (PHT): 3.46 cm     TR Peak grad:   33.9 mmHg MV Peak grad:  7.3 mmHg     TR Vmax:        291.00 cm/s MV Mean grad:  4.0 mmHg MV Vmax:       1.35 m/s     SHUNTS MV Vmean:      98.4 cm/s    Systemic VTI:  0.13 m MV Decel Time: 219 msec     Systemic Diam: 2.00 cm MV E velocity:  157.00 cm/s MV A velocity: 119.00 cm/s MV E/A ratio:  1.32 Skeet Latch MD Electronically signed by Skeet Latch MD Signature Date/Time: 08/11/2022/9:03:26 AM    Final    CT Angio Chest PE W and/or Wo Contrast  Result Date: 08/10/2022 CLINICAL DATA:  High clinical suspicion for PE EXAM: CT ANGIOGRAPHY CHEST WITH CONTRAST TECHNIQUE: Multidetector CT imaging of the chest was performed using the standard protocol during bolus administration of intravenous contrast. Multiplanar CT image reconstructions and  MIPs were obtained to evaluate the vascular anatomy. RADIATION DOSE REDUCTION: This exam was performed according to the departmental dose-optimization program which includes automated exposure control, adjustment of the mA and/or kV according to patient size and/or use of iterative reconstruction technique. CONTRAST:  31m OMNIPAQUE IOHEXOL 350 MG/ML SOLN COMPARISON:  CT done on 03/01/2022, chest radiograph done on 08/10/2022 FINDINGS: Cardiovascular: There is homogeneous enhancement in thoracic aorta. Scattered calcifications are seen in thoracic aorta and its major branches. Coronary artery calcifications are seen. Intraluminal filling defect is seen in a subsegmental pulmonary artery branch in posterior left lower lung field. RV LV ratio is 1.4. Small pocket of air is seen in right subclavian vein, possibly introduced during venipuncture. Mediastinum/Nodes: No significant lymphadenopathy is seen in mediastinum. There is gaseous distention of thoracic esophagus. Lungs/Pleura: Small patchy infiltrates are seen in right middle lobe, left upper lobe and both lower lobes. There is mild thickening of interlobular septi. Minimal bilateral pleural effusions are seen, more so on the right side. Upper Abdomen: There is fatty infiltration in liver. There is 2.6 cm cyst in the left lobe of liver. Small hiatal hernia is seen. Left kidney is smaller than usual. This may be a congenital variation or suggest left  renal artery stenosis. Musculoskeletal: No acute findings are seen. Review of the MIP images confirms the above findings. IMPRESSION: There is acute PE with small thrombus burden in subsegmental branch in left lower lobe. There is right ventricular strain which may be chronic. RV LV ratio is 1.4. Aortic atherosclerosis.  Coronary artery calcifications are seen. There are small patchy densities in both mid and both lower lung fields suggesting atelectasis/pneumonia. Minimal bilateral pleural effusions, more so on the right side. There is mild thickening of interlobular septi suggesting possible minimal interstitial edema. Imaging finding of pulmonary embolism was relayed to patient's provider Dr. SBilly Fischerby telephone call. Fatty liver.  Left hepatic cyst.  Left kidney is smaller than right. Electronically Signed   By: PElmer PickerM.D.   On: 08/10/2022 13:32   CT Head Wo Contrast  Result Date: 08/10/2022 CLINICAL DATA:  Barium EXAM: CT HEAD WITHOUT CONTRAST TECHNIQUE: Contiguous axial images were obtained from the base of the skull through the vertex without intravenous contrast. RADIATION DOSE REDUCTION: This exam was performed according to the departmental dose-optimization program which includes automated exposure control, adjustment of the mA and/or kV according to patient size and/or use of iterative reconstruction technique. COMPARISON:  CT Head 03/01/22. FINDINGS: Brain: No evidence of acute infarction, hemorrhage, hydrocephalus, extra-axial collection or mass lesion/mass effect. Sequela of moderate chronic microvascular ischemic change. Vascular: No hyperdense vessel or unexpected calcification. Skull: Normal. Negative for fracture or focal lesion. Sinuses/Orbits: Bilateral lens replacements. Bilateral staphyloma is. Mild mucosal thickening left maxillary sinus. Other: None. IMPRESSION: No CT finding to explain altered mental status. Electronically Signed   By: HMarin RobertsM.D.   On:  08/10/2022 11:42   DG Chest Portable 1 View  Result Date: 08/10/2022 CLINICAL DATA:  Shortness of breath EXAM: PORTABLE CHEST 1 VIEW COMPARISON:  06/25/2017 FINDINGS: There is poor inspiration. Cardiac size is within normal limits. There are no signs of alveolar pulmonary edema or focal pulmonary consolidation. Left lateral costophrenic angle is indistinct. IMPRESSION: There are no signs of alveolar pulmonary edema or focal pulmonary consolidation. Left lateral CP angle is indistinct which may be due to poor inspiration or suggest minimal effusion. Electronically Signed   By: PElmer PickerM.D.   On: 08/10/2022 11:39  Assessment and Plan:   Paroxysmal A fib RVR, new diagnosis  - mediated by multiple infection and hyperthyroidism 2/2 drug overdose  - continue treatment of infection and hyperthyroidism  -  keep Mag >2 and K >4 -  agree with increasing metoprolol to '50mg'$  BID, allergic to cardizem, agree with short term use of amiodarone as her COPD is mild and much improved since 2014 PFT  - already on anticoagulation for PE, would transition to therapeutic anticoagulation with Eliquis at time of discharge, discussed risk of bleeding with her daughter, will need more supervision and effort to prevent fall, need re-assess need of anticoagulation at regular interval   Chronic diastolic heart failure  - on lasix at home, would hold for now to allow BP room for A fib meds, clinically euvolemic   Acute pulmonary embolism with right heart strain  - incidental finding of PE on CT chest this admission - seems unprovoked  - on heparin gtt  - defer to primary team on deciding if catheter directed thrombolysis needed   HTN - BP becoming elevated, on amlodipine, metoprolol, and lasix at home, would resume amlodipine if persistently elevated   Acute hypoxic respiratory failure Hyperthyroidism with hx of hypothyroidism  Community acquired Pneumonia  Enterobacter faecalis UTI  Acute metabolic  encephalopathy Dementia  AKI on CKD III Anemia  COPD GERD Debility  - per primary team   Risk Assessment/Risk Scores:   CHA2DS2-VASc Score = 5  This indicates a 7.2% annual risk of stroke. The patient's score is based upon: CHF History: 1 HTN History: 1 Diabetes History: 0 Stroke History: 0 Vascular Disease History: 0 Age Score: 2 Gender Score: 1   For questions or updates, please contact [place] Please consult [email or web address] for contact info under    Signed, Margie Billet, NP  08/12/2022 3:04 PM  Personally seen and examined. Agree with above.  88 year old with atrial fibrillation rapid ventricular response in the setting of PE, urinary tract infection, pneumonia.  Acute PE was a small thrombus burden in the subsegmental branch in the left lower lobe.  This would be unusual to cause RV strain however the RV to LV ratio was 1.4.  This is likely secondary to her underlying COPD/age.  She has been on IV heparin as well as antibiotic.  Yesterday her heart rates were in the 160s, challenging to control.  IV amiodarone was utilized.  She is also taking metoprolol as well.  She is allergic to Cardizem.  This medication strategy seems to be working quite well reducing her heart rates into the low 100s.  She is pleasant, laying comfortably.  Her daughter is at bedside.  She lives with her daughter, her only child.  Hyperthyroidism was noted with low TSH and mildly elevated free T4.  This is also likely playing a role in her atrial arrhythmia as well.  We agree with increasing the metoprolol to 50 mg twice a day and to continue with short-term use of amiodarone.  Her daughter is a bit hesitant about anticoagulation at home.  I think it makes sense for her to be on Eliquis low-dose 2.5 mg twice a day when able especially during the next 6 months given her acute PE.  Obviously this can be addressed in future visits and if she wishes to withdraw from the Eliquis given her age  and dementia, I would not be opposed.  Candee Furbish, MD

## 2022-08-12 NOTE — Progress Notes (Signed)
SLP Cancellation Note  Patient Details Name: Erin Ryan MRN: [id number] DOB: 1924/11/06   Cancelled treatment:       Reason Eval/Treat Not Completed: Other (comment) (patient getting ready to get a bath with RN, RN reports pt tolerating diet - will continue efforts)  Kathleen Lime, MS [facility] SLP Acute Rehab Services Office [phone] Pager [phone]  Macario Golds 08/12/2022, 3:59 PM

## 2022-08-12 NOTE — Progress Notes (Signed)
PROGRESS NOTE  Erin Ryan  DOB: 06-29-1925  PCP: Harlan Stains, MD GHW:[id number]  DOA: 08/10/2022  LOS: 2 days  Hospital Day: 3  Brief narrative: Erin Ryan is a 88 y.o. female with PMH significant for dementia, HTN, HLD, CKD, COPD, GERD, hypothyroidism, hearing loss, anxiety, arthritis, compression fracture who lives at home with her daughter. 11/19, patient was brought to the ED at [facility] by her daughter with confusion, concerning for UTI.  Per daughter, patient had some urinary symptoms last week but was improved on its own.  For the last several weeks, patient is also having progressively worsening dyspnea on exertion.  In the ED, patient was afebrile, initially heart rate in 90s, blood pressure 130/53, breathing on room air  While in the ED, she started having chest pain.  She was noted to be in A-fib with RVR with heart rate up to 160 bpm.  She was given a dose of IV metoprolol 5 mg with conversion to normal sinus rhythm with heart rate in 70s Labs showed WBC count 12.5, hemoglobin 9.8, platelet 225, sodium 135, potassium 4, BUN/creatinine 51/1.22, glucose 139, BNP elevated 524, troponin normal, lactic acid level normal Respite virus panel negative for COVID, flu Urinalysis showed cloudy yellow urine with trace hemoglobin, large leukocytes, negative nitrate, few bacteria CT angio chest showed an acute PE with small thrombus burden in the subsegmental branch of the left lower lobe with RV strain.  It also showed small patchy densities in both mid and both lower lung fields suggesting atelectasis/pneumonia, minimal bilateral pleural effusion R>L. CT head with no acute findings. Patient was started on heparin drip.  Also empirically started on IV Rocephin. Admitted to [facility].  See below for details.  Patient's hospitalization has been complicated by persistent RVR likely precipitated by action, hypoxia as well as hyperthyroidism.  Subjective: Patient was seen and  examined this morning.   Lying down in bed.  On 5 L oxygen by nasal cannula.  Alert, awake, oriented to place and person.  Hard of hearing.  Daughter [name] at bedside.   Since yesterday evening, patient is in A-fib with RVR.  Amiodarone drip was started last night with partial control.  Remains in A-fib.  Cardiology consulted   Assessment/Plan: Acute pulmonary embolism CT angio showed acute PE with small thrombus burden subsegmental branch of the left lower lobe with RV strain as well. Currently on heparin drip.  Need to be started on oral anticoagulant prior to discharge. No evidence of DVT on ultrasound duplex of lower extremities.  Acute respiratory failure with hypoxia Secondary to pneumonia, PE, A-fib with RVR  Oxygen requirement up today on 4 to 6 L since last night.  IV fluid has been stopped.  Continue to wean down as tolerated.  A-fib with RVR No history of A-fib in the past. Patient was in RVR in the ED which responded to IV metoprolol.  Oral metoprolol was resumed. RVR recurred again last night.  Could not restart Cardizem because of significant history of allergy.  1 dose of IV digoxin was given.  IV metoprolol was repeated despite which RVR did not improve.  I started the patient on amiodarone drip last night. Currently consulted.  HypERthyroidism History of hypothyroidism Family states that patient had thyroid removal surgery 5 decades ago and has remained on Synthroid since then.  Recently on Synthroid 125 mcg daily.  Not sure when was the last time it was adjusted. 11/20, TSH level significantly suppressed to less than 0.01.  Free T4 elevated to 2.04. I have kept Synthroid and hold.  Continue to monitor TSH and free T4.  Would probably resume at a lower dose prior to discharge. Recent Labs    08/11/22 0344  TSH <0.010*   Mild bilateral pneumonia CT chest on admission showed small patchy densities in both mid and both lower lung fields suggesting atelectasis/pneumonia,  minimal bilateral pleural effusion R>L. Currently on IV Unasyn No fever.  WBC count remains mildly elevated. Recent Labs  Lab 08/10/22 1103 08/10/22 1154 08/11/22 0344 08/12/22 0324  WBC 12.5*  --  16.0* 14.2*  LATICACIDVEN  --  1.7  --   --   PROCALCITON  --   --  0.54 0.41   Enterobacter faecalis UTI Urinalysis showed cloudy yellow urine with trace hemoglobin, large leukocytes, negative nitrate, few bacteria Urine culture grew more than 1000 CFU per mL of Enterobacter faecalis.  Currently on IV Unasyn.  Acute metabolic encephalopathy Vascular dementia, anxiety Primarily brought from home with confusion.  Likely secondary to UTI, pneumonia Remains confused this morning. Continue supportive care for dementia.  Continue Aricept, Cymbalta, Neurontin, Remeron nightly  AKI on CKD3a Baseline creatinine less than 1.  Creatinine running elevated, gradually trending up as below.  I stopped IV fluid yesterday because of the risk of worsening respiratory failure.  Not on diuretics.  Continue to monitor Recent Labs    11/12/21 1132 08/10/22 1103 08/11/22 0344 08/12/22 0324  BUN 28* 51* 45* 48*  CREATININE 1.00 1.22* 1.28* 1.36*   Chronic diastolic CHF  essential hypertension 11/20, TTE showed EF 55 to 15%, grade 2 diastolic dysfunction, mildly enlarged RV and mildly elevated PASP. PTA on amlodipine 2.5 mg daily, metoprolol 25 mg twice daily, Lasix 20 mg every other day Currently on metoprolol.  Amlodipine and Lasix on hold.  Continue to monitor blood pressure Recent Labs    08/11/22 0344  TSH <0.010*   Hyperlipidemia PTA on aspirin and a statin.  Currently on heparin drip and statin.    COPD As needed bronchodilators.  Switched from albuterol to Xopenex  GERD On Prilosec twice daily at home  Impaired mobility PT eval pending.  Goals of care   Code Status: Full Code family's wish for now.   Diet:  Diet Order             Diet regular Room service appropriate? Yes;  Fluid consistency: Thin  Diet effective now                  DVT prophylaxis:heparin drip     Antimicrobials: IV Ancef Fluid: IV fluid is stopped Consultants: none Family Communication: talked to the daughter [name] at bedside this morning  Status is: Inpatient  Continue in-hospital care because: Ongoing multiple comorbidities including RVR on amiodarone drip Level of care: Stepdown   Dispo: The patient is from: Home              Anticipated d/c is to: Pending clinical course              Patient currently is not medically stable to d/c.   Difficult to place patient No     Infusions:   amiodarone 30 mg/hr (08/12/22 1200)   ampicillin-sulbactam (UNASYN) IV Stopped (08/12/22 0402)   heparin 700 Units/hr (08/12/22 1200)    Scheduled Meds:  atorvastatin  40 mg Oral QHS   Chlorhexidine Gluconate Cloth  6 each Topical Daily   donepezil  10 mg Oral QHS   DULoxetine  30  mg Oral Daily   gabapentin  100 mg Oral QHS   lactose free nutrition  237 mL Oral TID BM   metoprolol tartrate  50 mg Oral BID   mirtazapine  15 mg Oral QHS   pantoprazole  40 mg Oral Daily   senna  1 tablet Oral BID    PRN meds: acetaminophen **OR** acetaminophen, hydrALAZINE, levalbuterol, LORazepam, nitroGLYCERIN, mouth rinse, oxyCODONE, polyethylene glycol, traZODone   Antimicrobials: Anti-infectives (From admission, onward)    Start     Dose/Rate Route Frequency Ordered Stop   08/11/22 1600  Ampicillin-Sulbactam (UNASYN) 3 g in sodium chloride 0.9 % 100 mL IVPB        3 g 200 mL/hr over 30 Minutes Intravenous Every 12 hours 08/11/22 1439     08/11/22 1000  cefTRIAXone (ROCEPHIN) 1 g in sodium chloride 0.9 % 100 mL IVPB  Status:  Discontinued        1 g 200 mL/hr over 30 Minutes Intravenous Every 24 hours 08/10/22 1806 08/11/22 1439   08/10/22 1230  cefTRIAXone (ROCEPHIN) 1 g in sodium chloride 0.9 % 100 mL IVPB        1 g 200 mL/hr over 30 Minutes Intravenous  Once 08/10/22 1223 08/10/22 1330        Objective: Vitals:   08/12/22 1100 08/12/22 1200  BP: 137/62 135/69  Pulse: 92 72  Resp: 18 (!) 21  Temp:    SpO2: 91% 92%    Intake/Output Summary (Last 24 hours) at 08/12/2022 1237 Last data filed at 08/12/2022 1200 Gross per 24 hour  Intake 2287.1 ml  Output 750 ml  Net 1537.1 ml   Filed Weights   08/10/22 1045 08/10/22 1711  Weight: 58.1 kg 57.8 kg   Weight change:  Body mass index is 23.31 kg/m.   Physical Exam: General exam: Pleasant, elderly Caucasian female.  Not in physical distress. Skin: No rashes, lesions or ulcers. HEENT: Atraumatic, normocephalic, no obvious bleeding Lungs: Diminished air entry bilaterally in the bases.  Mild cough on deep breathing. CVS: A-fib with RVR, no murmur GI/Abd soft, nontender, nondistended, bowel sound present CNS: Alert, awake, hard of hearing, mildly confused today.  Able to follow commands Psychiatry: Mood appropriate Extremities: No pedal edema, no calf tenderness  Data Review: I have personally reviewed the laboratory data and studies available.  F/u labs ordered Unresulted Labs (From admission, onward)     Start     Ordered   08/13/22 0500  Heparin level (unfractionated)  Daily at 5am,   R     Question:  Specimen collection method  Answer:  Lab=Lab collect   08/12/22 0005   08/12/22 1400  Heparin level (unfractionated)  Once-Timed,   TIMED       Question:  Specimen collection method  Answer:  Lab=Lab collect   08/12/22 0618   08/12/22 0500  CBC with Differential/Platelet  Daily at 5am,   R     Question:  Specimen collection method  Answer:  Lab=Lab collect   08/11/22 1019   08/12/22 1749  Basic metabolic panel  Daily at 5am,   R     Question:  Specimen collection method  Answer:  Lab=Lab collect   08/11/22 1019            Signed, Terrilee Croak, MD [facility] 08/12/2022

## 2022-08-12 NOTE — Progress Notes (Signed)
Bladder scan reading 451. Patient complaining that abdomen feels distended, like she can't pass urine. In and out catheterization performed using sterile technique. 550 cc of clear, yellow urine returned.

## 2022-08-12 NOTE — Progress Notes (Addendum)
ANTICOAGULATION CONSULT NOTE  Pharmacy Consult for Heparin Indication: pulmonary embolus  Allergies  Allergen Reactions   Ambrosia Trifida (Tall Ragweed) Allergy Skin Test Other (See Comments)    Other reaction(s): Other   Hydrochlorothiazide     hyponatremia   Lyrica [Pregabalin] Other (See Comments)    Severe confusion   Diltiazem Hcl Er Rash   Short Ragweed Pollen Ext     Other reaction(s): Other    Patient Measurements: Height: '5\' 2"'$  (157.5 cm) Weight: 57.8 kg (127 lb 6.8 oz) IBW/kg (Calculated) : 50.1 Heparin Dosing Weight: 57.2 kg  Vital Signs: Temp: 98.3 F (36.8 C) (11/21 1200) Temp Source: Oral (11/21 1200) BP: 135/69 (11/21 1200) Pulse Rate: 72 (11/21 1200)  Labs: Recent Labs    08/10/22 1103 08/10/22 1330 08/10/22 1506 08/10/22 2147 08/11/22 0344 08/11/22 0931 08/11/22 2123 08/12/22 0324 08/12/22 1343  HGB 11.8*  --   --   --  9.9*  --   --  10.8*  --   HCT 37.0  --   --   --  31.3*  --   --  34.8*  --   PLT 225  --   --   --  215  --   --  227  --   APTT 30  --   --  >200* 159*  --   --   --   --   LABPROT 13.9  --   --   --   --   --   --   --   --   INR 1.1  --   --   --   --   --   --   --   --   HEPARINUNFRC  --   --    < > >1.10* 1.07*   < > 0.32 0.28* 0.28*  CREATININE 1.22*  --   --   --  1.28*  --   --  1.36*  --   TROPONINIHS 16 13  --   --   --   --   --   --   --    < > = values in this interval not displayed.     Estimated Creatinine Clearance: 19.6 mL/min (A) (by C-G formula based on SCr of 1.36 mg/dL (H)).   Medications:  Medications Prior to Admission  Medication Sig Dispense Refill Last Dose   amLODipine (NORVASC) 2.5 MG tablet Take 2.5 mg by mouth daily.   Past Week   aspirin 81 MG tablet Take 81 mg by mouth daily.   Past Week   atorvastatin (LIPITOR) 40 MG tablet Take 40 mg by mouth daily.   Past Week   Calcium Citrate-Vitamin D (CALCIUM CITRATE + PO) Take 1 tablet 2 (two) times daily by mouth.   Past Week   denosumab  (PROLIA) 60 MG/ML SOSY injection Inject 60 mg into the skin every 6 (six) months.   Unk   Diclofenac Sodium (VOLTAREN EX) Apply 1 Application topically as needed (pain).   Past Month   donepezil (ARICEPT) 10 MG tablet Take 1 tablet (10 mg total) by mouth at bedtime. 90 tablet 3 Past Week   DULoxetine (CYMBALTA) 30 MG capsule Take 30 mg by mouth daily.   Past Week   furosemide (LASIX) 20 MG tablet Take 1 tablet (20 mg total) by mouth every other day. 90 tablet 2 Past Week   gabapentin (NEURONTIN) 100 MG capsule Take 100 mg by mouth at bedtime.   Past Week  levothyroxine (SYNTHROID) 125 MCG tablet Take 125 mcg by mouth in the morning. DAW1 Synthroid   Past Week   mirtazapine (REMERON) 15 MG tablet Take 15 mg by mouth at bedtime.   Past Week   Multiple Vitamins-Minerals (CENTRUM SILVER PO) Take 1 tablet by mouth daily.   Past Week   omeprazole (PRILOSEC) 40 MG capsule Take 1 capsule (40 mg total) by mouth 2 (two) times daily before a meal. (Patient taking differently: Take 40 mg by mouth daily.) 180 capsule 3 Past Week   metoprolol tartrate (LOPRESSOR) 25 MG tablet TAKE 1 TABLET BY MOUTH TWICE A DAY (Patient taking differently: Take 25 mg by mouth 2 (two) times daily.) 180 tablet 3 08/09/2022 at Night    Scheduled:   atorvastatin  40 mg Oral QHS   Chlorhexidine Gluconate Cloth  6 each Topical Daily   donepezil  10 mg Oral QHS   DULoxetine  30 mg Oral Daily   gabapentin  100 mg Oral QHS   lactose free nutrition  237 mL Oral TID BM   metoprolol tartrate  50 mg Oral BID   mirtazapine  15 mg Oral QHS   pantoprazole  40 mg Oral Daily   senna  1 tablet Oral BID   Infusions:   amiodarone 30 mg/hr (08/12/22 1200)   ampicillin-sulbactam (UNASYN) IV Stopped (08/12/22 0402)   heparin 700 Units/hr (08/12/22 1200)   PRN: acetaminophen **OR** acetaminophen, hydrALAZINE, levalbuterol, LORazepam, nitroGLYCERIN, mouth rinse, oxyCODONE, polyethylene glycol, traZODone  Assessment: 89 yof with a history  of HLD, HTN. Found to have new LLL PE on CTA with RH strain (may be chronic). Pharmacy consulted to dose IV heparin. Not on anticoagulants PTA.  Today, 08/12/2022: Heparin level remains slightly low despite increase in heparin rate to 700 units/hr CBC: Hg low but improved, pltc WNL No bleeding or infusion issues per RN  Goal of Therapy:  Heparin level 0.3-0.7 units/ml Monitor platelets by anticoagulation protocol: Yes   Plan:  Increase heparin infusion to 750 units/hr Check confirmatory heparin level in 8 hr  Daily CBC & heparin level Monitor for s/s of bleeding or worsening thrombosis F/u plans for long-term anticoagulation (Eliquis preferred over Xarelto d/t renal impairment)  Reuel Boom, PharmD, BCPS [phone] 08/12/2022, 2:43 PM

## 2022-08-12 NOTE — Progress Notes (Incomplete)
[place] for Heparin Indication: pulmonary embolus  Allergies  Allergen Reactions  . Ambrosia Trifida (Tall Ragweed) Allergy Skin Test Other (See Comments)    Other reaction(s): Other  . Hydrochlorothiazide     hyponatremia  . Lyrica [Pregabalin] Other (See Comments)    Severe confusion  . Diltiazem Hcl Er Rash  . Short Ragweed Pollen Ext     Other reaction(s): Other    Patient Measurements: Height: '5\' 2"'$  (157.5 cm) Weight: 57.8 kg (127 lb 6.8 oz) IBW/kg (Calculated) : 50.1 Heparin Dosing Weight: 57.2 kg  Vital Signs: Temp: 97.7 F (36.5 C) (11/21 2300) Temp Source: Oral (11/21 2300) BP: 121/74 (11/21 2300) Pulse Rate: 103 (11/21 2300)  Labs: Recent Labs    08/10/22 1103 08/10/22 1330 08/10/22 1506 08/10/22 2147 08/11/22 0344 08/11/22 0931 08/12/22 0324 08/12/22 1343 08/12/22 2236  HGB 11.8*  --   --   --  9.9*  --  10.8*  --   --   HCT 37.0  --   --   --  31.3*  --  34.8*  --   --   PLT 225  --   --   --  215  --  227  --   --   APTT 30  --   --  >200* 159*  --   --   --   --   LABPROT 13.9  --   --   --   --   --   --   --   --   INR 1.1  --   --   --   --   --   --   --   --   HEPARINUNFRC  --   --    < > >1.10* 1.07*   < > 0.28* 0.28* 0.26*  CREATININE 1.22*  --   --   --  1.28*  --  1.36*  --   --   TROPONINIHS 16 13  --   --   --   --   --   --   --    < > = values in this interval not displayed.     Estimated Creatinine Clearance: 19.6 mL/min (A) (by C-G formula based on SCr of 1.36 mg/dL (H)).   Medications:  Medications Prior to Admission  Medication Sig Dispense Refill Last Dose  . amLODipine (NORVASC) 2.5 MG tablet Take 2.5 mg by mouth daily.   Past Week  . aspirin 81 MG tablet Take 81 mg by mouth daily.   Past Week  . atorvastatin (LIPITOR) 40 MG tablet Take 40 mg by mouth daily.   Past Week  . Calcium Citrate-Vitamin D (CALCIUM CITRATE + PO) Take 1 tablet 2 (two) times daily by mouth.   Past Week  .  denosumab (PROLIA) 60 MG/ML SOSY injection Inject 60 mg into the skin every 6 (six) months.   Unk  . Diclofenac Sodium (VOLTAREN EX) Apply 1 Application topically as needed (pain).   Past Month  . donepezil (ARICEPT) 10 MG tablet Take 1 tablet (10 mg total) by mouth at bedtime. 90 tablet 3 Past Week  . DULoxetine (CYMBALTA) 30 MG capsule Take 30 mg by mouth daily.   Past Week  . furosemide (LASIX) 20 MG tablet Take 1 tablet (20 mg total) by mouth every other day. 90 tablet 2 Past Week  . gabapentin (NEURONTIN) 100 MG capsule Take 100 mg by mouth at bedtime.   Past Week  .  levothyroxine (SYNTHROID) 125 MCG tablet Take 125 mcg by mouth in the morning. DAW1 Synthroid   Past Week  . mirtazapine (REMERON) 15 MG tablet Take 15 mg by mouth at bedtime.   Past Week  . Multiple Vitamins-Minerals (CENTRUM SILVER PO) Take 1 tablet by mouth daily.   Past Week  . omeprazole (PRILOSEC) 40 MG capsule Take 1 capsule (40 mg total) by mouth 2 (two) times daily before a meal. (Patient taking differently: Take 40 mg by mouth daily.) 180 capsule 3 Past Week  . metoprolol tartrate (LOPRESSOR) 25 MG tablet TAKE 1 TABLET BY MOUTH TWICE A DAY (Patient taking differently: Take 25 mg by mouth 2 (two) times daily.) 180 tablet 3 08/09/2022 at Night    Scheduled:  . atorvastatin  40 mg Oral QHS  . Chlorhexidine Gluconate Cloth  6 each Topical Daily  . donepezil  10 mg Oral QHS  . DULoxetine  30 mg Oral Daily  . gabapentin  100 mg Oral QHS  . lactose free nutrition  237 mL Oral TID BM  . metoprolol tartrate  50 mg Oral BID  . mirtazapine  15 mg Oral QHS  . pantoprazole  40 mg Oral Daily  . senna  1 tablet Oral BID   Infusions:  . amiodarone 30 mg/hr (08/12/22 2221)  . ampicillin-sulbactam (UNASYN) IV Stopped (08/12/22 1544)  . heparin 750 Units/hr (08/12/22 2344)   PRN: acetaminophen **OR** acetaminophen, hydrALAZINE, levalbuterol, LORazepam, nitroGLYCERIN, mouth rinse, oxyCODONE, polyethylene glycol,  traZODone  Assessment: 89 yof with a history of HLD, HTN. Found to have new LLL PE on CTA with RH strain (may be chronic). Pharmacy consulted to dose IV heparin. Not on anticoagulants PTA.  Today, 08/12/2022: Heparin level 0.26- remains slightly low./decreased despite increase in heparin rate to 750 units/hr CBC: Hg low but improved, pltc WNL No bleeding or infusion issues per RN  Goal of Therapy:  Heparin level 0.3-0.7 units/ml Monitor platelets by anticoagulation protocol: Yes   Plan:  Give small heparin bolus 1000 units IV x1 Increase heparin infusion to 850 units/hr Recheck confirmatory heparin level in 8 hr  Daily CBC & heparin level Monitor for s/s of bleeding or worsening thrombosis F/u plans for long-term anticoagulation (Eliquis preferred over Xarelto d/t renal impairment)  Netta Cedars, PharmD, BCPS 08/12/2022, 11:59 PM

## 2022-08-12 NOTE — Progress Notes (Signed)
Bladder scan performed. Only reading 87 ccs of urine in the bladder at this time.

## 2022-08-12 NOTE — Progress Notes (Signed)
PT Cancellation Note  Patient Details Name: Erin Ryan MRN: [id number] DOB: 01/17/1925   Cancelled Treatment:    Reason Eval/Treat Not Completed: Medical issues which prohibited therapy, HR remains elevated , hold  PT per RN for now.[place] Office [phone] Weekend pager-[phone]    Claretha Cooper 08/12/2022, 9:56 AM

## 2022-08-13 ENCOUNTER — Inpatient Hospital Stay (HOSPITAL_COMMUNITY): Payer: Medicare Other

## 2022-08-13 DIAGNOSIS — I2609 Other pulmonary embolism with acute cor pulmonale: Secondary | ICD-10-CM | POA: Diagnosis not present

## 2022-08-13 DIAGNOSIS — I10 Essential (primary) hypertension: Secondary | ICD-10-CM

## 2022-08-13 DIAGNOSIS — N390 Urinary tract infection, site not specified: Secondary | ICD-10-CM | POA: Diagnosis not present

## 2022-08-13 DIAGNOSIS — R531 Weakness: Secondary | ICD-10-CM

## 2022-08-13 DIAGNOSIS — I480 Paroxysmal atrial fibrillation: Secondary | ICD-10-CM

## 2022-08-13 DIAGNOSIS — I2699 Other pulmonary embolism without acute cor pulmonale: Secondary | ICD-10-CM | POA: Diagnosis not present

## 2022-08-13 DIAGNOSIS — B952 Enterococcus as the cause of diseases classified elsewhere: Secondary | ICD-10-CM

## 2022-08-13 DIAGNOSIS — F03B0 Unspecified dementia, moderate, without behavioral disturbance, psychotic disturbance, mood disturbance, and anxiety: Secondary | ICD-10-CM

## 2022-08-13 DIAGNOSIS — N1831 Chronic kidney disease, stage 3a: Secondary | ICD-10-CM

## 2022-08-13 DIAGNOSIS — I4891 Unspecified atrial fibrillation: Secondary | ICD-10-CM | POA: Diagnosis not present

## 2022-08-13 DIAGNOSIS — K219 Gastro-esophageal reflux disease without esophagitis: Secondary | ICD-10-CM

## 2022-08-13 DIAGNOSIS — E039 Hypothyroidism, unspecified: Secondary | ICD-10-CM

## 2022-08-13 LAB — CBC WITH DIFFERENTIAL/PLATELET
Abs Immature Granulocytes: 0.15 10*3/uL — ABNORMAL HIGH (ref 0.00–0.07)
Basophils Absolute: 0.1 10*3/uL (ref 0.0–0.1)
Basophils Relative: 1 %
Eosinophils Absolute: 0.3 10*3/uL (ref 0.0–0.5)
Eosinophils Relative: 3 %
HCT: 35.6 % — ABNORMAL LOW (ref 36.0–46.0)
Hemoglobin: 10.9 g/dL — ABNORMAL LOW (ref 12.0–15.0)
Immature Granulocytes: 1 %
Lymphocytes Relative: 12 %
Lymphs Abs: 1.4 10*3/uL (ref 0.7–4.0)
MCH: 28.6 pg (ref 26.0–34.0)
MCHC: 30.6 g/dL (ref 30.0–36.0)
MCV: 93.4 fL (ref 80.0–100.0)
Monocytes Absolute: 1.2 10*3/uL — ABNORMAL HIGH (ref 0.1–1.0)
Monocytes Relative: 11 %
Neutro Abs: 7.9 10*3/uL — ABNORMAL HIGH (ref 1.7–7.7)
Neutrophils Relative %: 72 %
Platelets: 252 10*3/uL (ref 150–400)
RBC: 3.81 MIL/uL — ABNORMAL LOW (ref 3.87–5.11)
RDW: 14.7 % (ref 11.5–15.5)
WBC: 11 10*3/uL — ABNORMAL HIGH (ref 4.0–10.5)
nRBC: 0 % (ref 0.0–0.2)

## 2022-08-13 LAB — BASIC METABOLIC PANEL
Anion gap: 8 (ref 5–15)
BUN: 46 mg/dL — ABNORMAL HIGH (ref 8–23)
CO2: 22 mmol/L (ref 22–32)
Calcium: 7.4 mg/dL — ABNORMAL LOW (ref 8.9–10.3)
Chloride: 109 mmol/L (ref 98–111)
Creatinine, Ser: 1.15 mg/dL — ABNORMAL HIGH (ref 0.44–1.00)
GFR, Estimated: 44 mL/min — ABNORMAL LOW (ref >60)
Glucose, Bld: 112 mg/dL — ABNORMAL HIGH (ref 70–99)
Potassium: 4.3 mmol/L (ref 3.5–5.1)
Sodium: 139 mmol/L (ref 135–145)

## 2022-08-13 LAB — HEPARIN LEVEL (UNFRACTIONATED)
Heparin Unfractionated: 0.63 IU/mL (ref 0.30–0.70)
Heparin Unfractionated: 0.45 IU/mL (ref 0.30–0.70)

## 2022-08-13 LAB — MAGNESIUM: Magnesium: 1.9 mg/dL (ref 1.7–2.4)

## 2022-08-13 MED ORDER — DICLOFENAC SODIUM 1 % EX GEL: 2.0000 g | Freq: Four times a day (QID) | CUTANEOUS | Status: DC | PRN

## 2022-08-13 MED ORDER — POLYVINYL ALCOHOL 1.4 % OP SOLN
1.0000 [drp] | OPHTHALMIC | Status: DC | PRN
  Filled 2022-08-13: qty 15

## 2022-08-13 MED ORDER — UMECLIDINIUM BROMIDE 62.5 MCG/ACT IN AEPB
1.0000 | INHALATION_SPRAY | Freq: Every day | RESPIRATORY_TRACT | Status: DC
  Administered 2022-08-14 – 2022-08-20 (×7): 1 via RESPIRATORY_TRACT
  Filled 2022-08-13 (×3): qty 7

## 2022-08-13 MED ORDER — AMIODARONE HCL 200 MG PO TABS
200.0000 mg | ORAL_TABLET | Freq: Two times a day (BID) | ORAL | Status: DC
  Administered 2022-08-13 (×2): 200 mg via ORAL
  Filled 2022-08-13 (×2): qty 1

## 2022-08-13 MED ORDER — HEPARIN BOLUS VIA INFUSION
1000.0000 [IU] | Freq: Once | INTRAVENOUS | Status: AC
  Administered 2022-08-13: 1000 [IU] via INTRAVENOUS
  Filled 2022-08-13: qty 1000

## 2022-08-13 MED ORDER — MOMETASONE FURO-FORMOTEROL FUM 200-5 MCG/ACT IN AERO
2.0000 | INHALATION_SPRAY | Freq: Two times a day (BID) | RESPIRATORY_TRACT | Status: DC
  Administered 2022-08-13 – 2022-08-20 (×13): 2 via RESPIRATORY_TRACT
  Filled 2022-08-13 (×3): qty 8.8

## 2022-08-13 MED ORDER — AMIODARONE HCL 200 MG PO TABS: 200.0000 mg | ORAL_TABLET | Freq: Every day | ORAL | Status: DC

## 2022-08-13 MED ORDER — FUROSEMIDE 10 MG/ML IJ SOLN
20.0000 mg | Freq: Two times a day (BID) | INTRAMUSCULAR | Status: AC
  Administered 2022-08-13 – 2022-08-15 (×4): 20 mg via INTRAVENOUS
  Filled 2022-08-13 (×4): qty 2

## 2022-08-13 NOTE — Progress Notes (Signed)
Speech Language Pathology Treatment: Dysphagia  Patient Details Name: Erin Ryan MRN: [id number] DOB: May 15, 1925 Today's Date: 08/13/2022 Time: 2951-8841 SLP Time Calculation (min) (ACUTE ONLY): 50 min  Assessment / Plan / Recommendation Clinical Impression  Patient seen with daughter for follow up re: pt's swallowing ability.  Patient consumed a small muffin and chocolate milk this am per her daughter without difficulties.   Daughter informed SLP that pt saw [facility] SLP approx one year ago for swallowing and was cleared for regular/thin diet.    Arthritis is significant causing patient to currently need assistance with po.  Facilitated SLP session by helping pt hand over hand to eat/drink.  Pt consumed graham cracker, applesauce, water, apple juice, chocolate milk and Boost.  Frequent eructation noted during intake followed by intermittent both immediate and delayed cough.  Consistent throat clearing noted across liquid boluses as well.   Daughter reports pt recently has been clearing her throat at home often and will occasionally report "strangling" - then consuming liquids to clear.  Suspect pt may have some low grade aspiration recently likely from esophageal deficits.  Discussed with daughter the benefits and risks of considering instrumental swallow eval - esophagram and pt potentially aspirating on esophagram.  Reviewed benefits/risks of potential diet limitation including worsening baseline poor intake. - Per discussion with daughter - she would like for pt to continue current diet with dysphagia mitigations/precautions - monitoring her tolerance.  SLP will follow up Friday 08/15/2022 for treatment.    Of note, pt with dark spot posterior hard palate-tora - Which daughter states tiny ulcer noted approx 3 months ago in same spot but states it is much worse now.   Pt denies any discomfort with this area.        HPI HPI: Patient is a 88 y.o. female with PMH: dementia, HTN, HLD, CKD, COPD, GERD,  hearing loss, anxiety, arthritis, compression fracture who lives at home with her daughter. She presented to ED at [facility] on 08/10/22 with confusion and concern for UTI as well as daughter reporting patient to have several weeks of progressively worsening dyspnea on exertion. In ED, patient was afebrile, on RA, BP 130/53. She started to have chest pain and was noted to be in a-fib. CT angio chest showed an acute PE with small thrombus burden in the subsegmental branch of the left lower lobe with RV strain.  It also showed small patchy densities in both mid and both lower lung fields suggesting atelectasis/pneumonia, minimal bilateral pleural effusion R>L. CT head did not reveal any acute findings.  Pt underwent prior evaluation at home with SLP via home health.  SLP follow up indicated.      SLP Plan  Continue with current plan of care      Recommendations for follow up therapy are one component of a multi-disciplinary discharge planning process, led by the attending physician.  Recommendations may be updated based on patient status, additional functional criteria and insurance authorization.    Recommendations  Diet recommendations: Regular;Thin liquid Liquids provided via: Cup;No straw Medication Administration: Whole meds with puree Supervision: Staff to assist with self feeding Compensations: Slow rate;Small sips/bites Postural Changes and/or Swallow Maneuvers: Seated upright 90 degrees;Upright 30-60 min after meal                Oral Care Recommendations: Oral care BID Follow Up Recommendations:  (TBD) SLP Visit Diagnosis: Dysphagia, unspecified (R13.10) Plan: Continue with current plan of care  Kathleen Lime, MS [facility] SLP Acute Rehab Services Office [phone] Pager [phone]   Macario Golds  08/13/2022, 12:50 PM

## 2022-08-13 NOTE — Progress Notes (Addendum)
Rounding Note    [name] Date of Encounter: 08/13/2022  English Cardiologist: Dr Erin Ryan  Subjective   She is sleeping, easily awakened, not good historian due to confusion/dementia, denied any pain or discomfort. Daughter is at bedside, states her HR was up to 170s last night but all better now. She had some issue with nursing staff overnight and she was not happy about.   Inpatient Medications    Scheduled Meds:  amiodarone  200 mg Oral BID   Followed by   Erin Ryan ON 08/20/2022] amiodarone  200 mg Oral Daily   atorvastatin  40 mg Oral QHS   Chlorhexidine Gluconate Cloth  6 each Topical Daily   donepezil  10 mg Oral QHS   DULoxetine  30 mg Oral Daily   gabapentin  100 mg Oral QHS   lactose free nutrition  237 mL Oral TID BM   metoprolol tartrate  50 mg Oral BID   mirtazapine  15 mg Oral QHS   mometasone-formoterol  2 puff Inhalation BID   pantoprazole  40 mg Oral Daily   senna  1 tablet Oral BID   umeclidinium bromide  1 puff Inhalation Daily   Continuous Infusions:  ampicillin-sulbactam (UNASYN) IV Stopped (08/13/22 0331)   heparin 850 Units/hr (08/13/22 0600)   PRN Meds: acetaminophen **OR** acetaminophen, diclofenac Sodium, hydrALAZINE, levalbuterol, LORazepam, nitroGLYCERIN, mouth rinse, oxyCODONE, polyethylene glycol, traZODone   Vital Signs    Vitals:   08/13/22 0400 08/13/22 0500 08/13/22 0600 08/13/22 0800  BP: (!) 104/45 116/71 120/61   Pulse: (!) 111 73 96   Resp: '16 17 16   '$ Temp:    (!) 97.5 F (36.4 C)  TempSrc:    Oral  SpO2: 93% 94% 95%   Weight:      Height:        Intake/Output Summary (Last 24 hours) at 08/13/2022 1051 Last data filed at 08/13/2022 0919 Gross per 24 hour  Intake 892.01 ml  Output 1350 ml  Net -457.99 ml      08/10/2022    5:11 PM 08/10/2022   10:45 AM 05/20/2022    9:22 AM  Last 3 Weights  Weight (lbs) 127 lb 6.8 oz 128 lb 120 lb  Weight (kg) 57.8 kg 58.06 kg 54.432 kg       Telemetry    Converted to sinus rhythm 80-90s with frequent PACs this morning at 10am during encounter,  sinus pause <1.6s, A fib RVR over the past 24 hours  - Personally Reviewed  ECG    EKG from today at 8:21 A fib  with VR of 95 bpm, inferolateral ST elevation resolved (were present on 08/10/22 10:46am EKG) ,  repeat EKG at 1038am ordered/pending   - Personally Reviewed  Physical Exam   GEN: No acute distress.   Neck: No JVD Cardiac: Irregular, no murmurs, rubs, or gallops.  Respiratory: Clear but diminished at base to auscultation bilaterally, on [place] oxygen. GI: Soft, nontender MS: Trace BLE edema; No deformity. Neuro:  Fatigued, oriented to self, cognitive impaired    Psych: Normal affect   Labs    High Sensitivity Troponin:   Recent Labs  Lab 08/10/22 1103 08/10/22 1330  TROPONINIHS 16 13     Chemistry Recent Labs  Lab 08/10/22 1103 08/11/22 0344 08/12/22 0324 08/13/22 0318 08/13/22 0810  NA 135 138 135 139  --   K 4.0 4.1 3.8 4.3  --   CL 102 107 106 109  --  CO2 24 22 20* 22  --   GLUCOSE 139* 122* 120* 112*  --   BUN 51* 45* 48* 46*  --   CREATININE 1.22* 1.28* 1.36* 1.15*  --   CALCIUM 7.9* 7.6* 7.4* 7.4*  --   MG  --   --   --   --  1.9  PROT 6.5  --   --   --   --   ALBUMIN 2.8*  --   --   --   --   AST 28  --   --   --   --   ALT 18  --   --   --   --   ALKPHOS 64  --   --   --   --   BILITOT 0.4  --   --   --   --   GFRNONAA 41* 39* 36* 44*  --   ANIONGAP '9 9 9 8  '$ --     Lipids No results for input(s): "CHOL", "TRIG", "HDL", "LABVLDL", "LDLCALC", "CHOLHDL" in the last 168 hours.  Hematology Recent Labs  Lab 08/11/22 0344 08/12/22 0324 08/13/22 0318  WBC 16.0* 14.2* 11.0*  RBC 3.42* 3.80* 3.81*  HGB 9.9* 10.8* 10.9*  HCT 31.3* 34.8* 35.6*  MCV 91.5 91.6 93.4  MCH 28.9 28.4 28.6  MCHC 31.6 31.0 30.6  RDW 14.6 14.7 14.7  PLT 215 227 252   Thyroid  Recent Labs  Lab 08/11/22 0344 08/11/22 0931  TSH <0.010*  --   FREET4  --   2.04*    BNP Recent Labs  Lab 08/10/22 1103  BNP 523.6*    DDimer No results for input(s): "DDIMER" in the last 168 hours.   Radiology    DG CHEST PORT 1 VIEW  Result Date: 08/13/2022 CLINICAL DATA:  Hypoxia. EXAM: PORTABLE CHEST 1 VIEW COMPARISON:  August 10, 2022. FINDINGS: Stable cardiomediastinal silhouette. Significant increased bilateral lung opacities are noted concerning for worsening edema with mild bilateral pleural effusions. Bony thorax is unremarkable. IMPRESSION: Significant increased bilateral lung opacities are noted concerning for worsening edema with mild bilateral pleural effusions. Electronically Signed   By: Erin Ryan M.D.   On: 08/13/2022 08:48    Cardiac Studies   Echo from 08/11/22:   1. Left ventricular ejection fraction, by estimation, is 55 to 60%. The  left ventricle has normal function. The left ventricle has no regional  wall motion abnormalities. There is mild asymmetric left ventricular  hypertrophy of the basal-septal segment.  Left ventricular diastolic parameters are consistent with Grade II  diastolic dysfunction (pseudonormalization). Elevated left ventricular  end-diastolic pressure.   2. Right ventricular systolic function is low normal. The right  ventricular size is mildly enlarged. There is mildly elevated pulmonary  artery systolic pressure.   3. Right atrial size was mildly dilated.   4. The mitral valve is grossly normal. Mild mitral valve regurgitation.  Mild mitral stenosis. Moderate mitral annular calcification.   5. The aortic valve is tricuspid. There is mild thickening of the aortic  valve. Aortic valve regurgitation is moderate. Aortic valve sclerosis is  present, with no evidence of aortic valve stenosis.   Patient Profile     Erin Ryan is a 88 y.o. female with a hx of dementia, HTN, HLD, CKD, GERD, tachycardia, chronic dyspnea, mild COPD, hypothyroidism, hearing loss, anxiety,  cardiology is following since  08/12/2022 for the evaluation of A fib RVR at the request of Dr Erin Ryan.   Assessment &  Plan    Paroxysmal A fib RVR, new diagnosis  - mediated by multiple infection and hyperthyroidism 2/2 drug overdose  - continue treatment of infection and hyperthyroidism  -  keep Mag >2 and K >4 - repeat EKG at 8am today showed rate controlled A fib with resolved STE of inferolateral leads that were present on admission, Hs trop negative x2, never had chest pain, EKG change possible due to RV strain? -  converted to sinus rhythm with frequent PAC on telemetry this AM during encounter, continue metoprolol to '50mg'$  BID, allergic to cardizem, will  transition IV amiodarone to PO taper '200mg'$  BID x7 days and '200mg'$  daily after today, likely will need for a short course only, COPD has much improved since 2014 PFT - already on anticoagulation for PE, would transition to therapeutic anticoagulation with Eliquis 2.'5mg'$  BID at time of discharge, discussed risk of bleeding with her daughter, will need more supervision and effort to prevent fall, need re-assess need of anticoagulation at regular interval    Chronic diastolic heart failure  - on lasix at home, would hold for now to allow BP room for A fib meds, clinically euvolemic    Acute pulmonary embolism with right heart strain  - incidental finding of PE on CT chest this admission - seems unprovoked  - on heparin gtt  - defer to primary team on deciding if catheter directed thrombolysis needed    HTN - BP controlled, on amlodipine, metoprolol, and lasix at home, would resume amlodipine if elevated    Acute hypoxic respiratory failure Hyperthyroidism with hx of hypothyroidism  Community acquired Pneumonia  Enterobacter faecalis UTI  Acute metabolic encephalopathy Dementia  AKI on CKD III Anemia  COPD GERD Debility  - per primary team   For questions or updates, please contact [place] Please consult [email or web address] for contact info under         Signed, Margie Billet, NP  08/13/2022, 10:51 AM    Personally seen and examined. Agree with above.  Spoke with Dr. Grandville Silos.  There is some decreased breath sounds at bases, some concern for increasing oxygen demand and potential intravascular pulmonary edema.  We will go ahead and give her Lasix IV 20 mg twice daily for 2 days.  Closely monitor her renal function.  Note she does have underlying pneumonia as well.  She has converted to sinus rhythm.  Taper amiodarone as above agree. Transition to Eliquis 2.5 mg twice daily at discharge.  Candee Furbish, MD

## 2022-08-13 NOTE — Progress Notes (Signed)
ANTICOAGULATION CONSULT NOTE  Pharmacy Consult for Heparin Indication: pulmonary embolus  Allergies  Allergen Reactions   Ambrosia Trifida (Tall Ragweed) Allergy Skin Test Other (See Comments)    Other reaction(s): Other   Hydrochlorothiazide     hyponatremia   Lyrica [Pregabalin] Other (See Comments)    Severe confusion   Diltiazem Hcl Er Rash   Short Ragweed Pollen Ext     Other reaction(s): Other    Patient Measurements: Height: '5\' 2"'$  (157.5 cm) Weight: 66.6 kg (146 lb 13.2 oz) IBW/kg (Calculated) : 50.1 Heparin Dosing Weight: 57.2 kg  Vital Signs: Temp: 97.5 F (36.4 C) (11/22 1600) Temp Source: Oral (11/22 1600) BP: 91/68 (11/22 1900) Pulse Rate: 84 (11/22 1900)  Labs: Recent Labs    08/10/22 2147 08/10/22 2147 08/11/22 0344 08/11/22 0931 08/12/22 0324 08/12/22 1343 08/12/22 2236 08/13/22 0318 08/13/22 0923 08/13/22 1810  HGB  --    < > 9.9*  --  10.8*  --   --  10.9*  --   --   HCT  --   --  31.3*  --  34.8*  --   --  35.6*  --   --   PLT  --   --  215  --  227  --   --  252  --   --   APTT >200*  --  159*  --   --   --   --   --   --   --   HEPARINUNFRC >1.10*  --  1.07*   < > 0.28*   < > 0.26*  --  0.63 0.45  CREATININE  --   --  1.28*  --  1.36*  --   --  1.15*  --   --    < > = values in this interval not displayed.     Estimated Creatinine Clearance: 26.2 mL/min (A) (by C-G formula based on SCr of 1.15 mg/dL (H)).   Medications:  Medications Prior to Admission  Medication Sig Dispense Refill Last Dose   amLODipine (NORVASC) 2.5 MG tablet Take 2.5 mg by mouth daily.   Past Week   aspirin 81 MG tablet Take 81 mg by mouth daily.   Past Week   atorvastatin (LIPITOR) 40 MG tablet Take 40 mg by mouth daily.   Past Week   Calcium Citrate-Vitamin D (CALCIUM CITRATE + PO) Take 1 tablet 2 (two) times daily by mouth.   Past Week   denosumab (PROLIA) 60 MG/ML SOSY injection Inject 60 mg into the skin every 6 (six) months.   Unk   Diclofenac Sodium  (VOLTAREN EX) Apply 1 Application topically as needed (pain).   Past Month   donepezil (ARICEPT) 10 MG tablet Take 1 tablet (10 mg total) by mouth at bedtime. 90 tablet 3 Past Week   DULoxetine (CYMBALTA) 30 MG capsule Take 30 mg by mouth daily.   Past Week   furosemide (LASIX) 20 MG tablet Take 1 tablet (20 mg total) by mouth every other day. 90 tablet 2 Past Week   gabapentin (NEURONTIN) 100 MG capsule Take 100 mg by mouth at bedtime.   Past Week   levothyroxine (SYNTHROID) 125 MCG tablet Take 125 mcg by mouth in the morning. DAW1 Synthroid   Past Week   mirtazapine (REMERON) 15 MG tablet Take 15 mg by mouth at bedtime.   Past Week   Multiple Vitamins-Minerals (CENTRUM SILVER PO) Take 1 tablet by mouth daily.   Past Week  omeprazole (PRILOSEC) 40 MG capsule Take 1 capsule (40 mg total) by mouth 2 (two) times daily before a meal. (Patient taking differently: Take 40 mg by mouth daily.) 180 capsule 3 Past Week   metoprolol tartrate (LOPRESSOR) 25 MG tablet TAKE 1 TABLET BY MOUTH TWICE A DAY (Patient taking differently: Take 25 mg by mouth 2 (two) times daily.) 180 tablet 3 08/09/2022 at Night    Scheduled:   amiodarone  200 mg Oral BID   Followed by   Derrill Memo ON 08/20/2022] amiodarone  200 mg Oral Daily   atorvastatin  40 mg Oral QHS   Chlorhexidine Gluconate Cloth  6 each Topical Daily   donepezil  10 mg Oral QHS   DULoxetine  30 mg Oral Daily   furosemide  20 mg Intravenous Q12H   gabapentin  100 mg Oral QHS   lactose free nutrition  237 mL Oral TID BM   metoprolol tartrate  50 mg Oral BID   mirtazapine  15 mg Oral QHS   mometasone-formoterol  2 puff Inhalation BID   pantoprazole  40 mg Oral Daily   senna  1 tablet Oral BID   umeclidinium bromide  1 puff Inhalation Daily   Infusions:   ampicillin-sulbactam (UNASYN) IV Stopped (08/13/22 1706)   heparin 850 Units/hr (08/13/22 1857)   PRN: acetaminophen **OR** acetaminophen, diclofenac Sodium, hydrALAZINE, levalbuterol, LORazepam,  nitroGLYCERIN, mouth rinse, oxyCODONE, polyethylene glycol, polyvinyl alcohol, traZODone  Assessment: 89 yof with a history of HLD, HTN. Found to have new LLL PE on CTA with RH strain (may be chronic). Pharmacy consulted to dose IV heparin. Not on anticoagulants PTA.  Today, 08/13/2022: Heparin level remains therapeutic on heparin infusion at 850 units/hr CBC: Hg stable, pltc WNL No bleeding or infusion issues noted  Goal of Therapy:  Heparin level 0.3-0.7 units/ml Monitor platelets by anticoagulation protocol: Yes   Plan:  Continue heparin infusion at  850 units/hr Daily CBC & heparin level Monitor for s/s of bleeding or worsening thrombosis F/u plans for long-term anticoagulation (Eliquis preferred over Xarelto d/t renal impairment)   Tawnya Crook, PharmD, BCPS Clinical Pharmacist 08/13/2022 7:31 PM

## 2022-08-13 NOTE — Progress Notes (Signed)
PROGRESS NOTE    Erin Ryan  JJK:[id number] DOB: 1925/09/19 DOA: 08/10/2022 PCP: Harlan Stains, MD    Chief Complaint  Patient presents with   Altered Mental Status    Brief Narrative:  Erin Ryan is a 88 y.o. female with PMH significant for dementia, HTN, HLD, CKD, COPD, GERD, hypothyroidism, hearing loss, anxiety, arthritis, compression fracture who lives at home with her daughter. 11/19, patient was brought to the ED at [facility] by her daughter with confusion, concerning for UTI.  Per daughter, patient had some urinary symptoms last week but was improved on its own.  For the last several weeks, patient is also having progressively worsening dyspnea on exertion.   In the ED, patient was afebrile, initially heart rate in 90s, blood pressure 130/53, breathing on room air  While in the ED, she started having chest pain.  She was noted to be in A-fib with RVR with heart rate up to 160 bpm.  She was given a dose of IV metoprolol 5 mg with conversion to normal sinus rhythm with heart rate in 70s Labs showed WBC count 12.5, hemoglobin 9.8, platelet 225, sodium 135, potassium 4, BUN/creatinine 51/1.22, glucose 139, BNP elevated 524, troponin normal, lactic acid level normal Respite virus panel negative for COVID, flu Urinalysis showed cloudy yellow urine with trace hemoglobin, large leukocytes, negative nitrate, few bacteria CT angio chest showed an acute PE with small thrombus burden in the subsegmental branch of the left lower lobe with RV strain.  It also showed small patchy densities in both mid and both lower lung fields suggesting atelectasis/pneumonia, minimal bilateral pleural effusion R>L. CT head with no acute findings. Patient was started on heparin drip.  Also empirically started on IV Rocephin. Admitted to [facility].  See below for details.   Patient's hospitalization has been complicated by persistent RVR likely precipitated by action, hypoxia as well as  hyperthyroidism.   Assessment & Plan:   Principal Problem:   Acute pulmonary embolism (HCC) Active Problems:   Essential hypertension   GERD   Chronic kidney disease (CKD), stage III (moderate) (HCC)   Adult hypothyroidism   Moderate dementia without behavioral disturbance, psychotic disturbance, mood disturbance, or anxiety (HCC)   Protein-calorie malnutrition, severe   Enterococcus UTI  #1 acute pulmonary emboli -CT angiogram chest showed acute PE with small thrombus burden subsegmental branch of the left lower lobe with RV strain as well. -Lower extremity Dopplers done negative for DVT. -2D echo done with EF of 55 to 60%, NWMA, mild asymmetric LVH of the basal septal segment, grade 2 diastolic dysfunction, low normal right ventricular systolic function, right ventricular size mildly enlarged, mildly elevated pulmonary artery systolic pressure, mildly dilated right atrial size.  Mild MVR.  Mild mitral stenosis.  Moderate mitral annular calcification.  Mild thickening of aortic valve.  Moderate AVR. -Currently on heparin drip could likely transition to oral Eliquis in the next 1 to 2 days if improved O2 requirements and clinical improvement.  2.  Acute respiratory failure with hypoxia -Multifactorial secondary to pneumonia, PE, A-fib with RVR, possible volume overload. -Patient with increasing O2 requirements currently on 6 L nasal cannula. -IV fluids discontinued. -Repeat chest x-ray with significant increased bilateral lung opacities noted concerning for worsening edema with mild bilateral pleural effusions. -Patient with decreased breath sounds noted on examination. -Volume overload likely secondary to A-fib with RVR. -Placed on Lasix 20 mg IV every 12 hours x 2 days. -Continue IV antibiotics for pneumonia.  Continue heparin for  PE. -Was on amiodarone drip and being transitioned to oral amiodarone per cardiology. -Supportive care.  3.  A-fib with RVR -Patient with no prior  history of A-fib in the past. -Noted initially on admission in the ED, responded with IV metoprolol and placed on oral metoprolol. -Patient noted to have RVR recur and unable to place on Cardizem due to significant allergy, status post 1 dose IV digoxin and patient subsequently placed on amiodarone drip. -Cardiology consulted and patient being transitioned from amiodarone drip to oral amiodarone today per cardiology. -On IV heparin due to acute PE. -Likely in addition to oral Eliquis if improvement with hypoxia and clinical improvement in the next 1 to 2 days.  4.  Hyperthyroidism/history of hypothyroidism -Per family patient noted to have thyroid removal surgery 5 decades ago noted to be on Synthroid since then and Synthroid recently at 125 mcg's daily. -Unsure of last time Synthroid was adjusted. -TFTs obtained 08/11/2022 with a TSH significantly suppressed to < 0.01, free T4 elevated to 2.04. -Synthroid currently on hold. -Could likely discharge on a decreased dose of Synthroid with close outpatient follow-up with PCP.  5.  Mild bilateral pneumonia -CT chest done with small patchy densities in both mid and lower lung lobes suggestive of atelectasis/pneumonia, minimal bilateral pleural effusion R > L. -Currently afebrile. -Leukocytosis slowly trending down. -Continue IV Unasyn.  6.  Enterococcus faecalis UTI -On IV Unasyn.  7.  Acute metabolic encephalopathy/vascular dementia/anxiety -Presenting complaints with confusion likely multifactorial secondary to UTI, pneumonia in the setting of dementia. -Mental status slowly improving per daughter at bedside from admission. -Continue IV antibiotics, Aricept, Cymbalta, Neurontin, Remeron.  8.  AKI on CKD stage IIIa. -Baseline creatinine < 1. -Creatinine initially noted to be slowly trending up as high as 1.36. -IV fluids discontinued creatinine down to 1.15. -Patient started on IV Lasix due to concerns for volume overload. -Monitor renal  function with diuresis.  9.  Acute on chronic diastolic CHF -Patient with increasing respiratory hypoxia currently on 6 L O2. -Acute CHF exacerbation likely triggered by A-fib with RVR. -Chest x-ray done this morning with significant increased bilateral lung opacities noted concerning for worsening edema with mild bilateral pleural effusion. -2D echo with EF of 55 to 53%, grade 2 diastolic dysfunction, mildly enlarged RV and mildly elevated PASP. -Patient noted to have been on Norvasc, metoprolol, Lasix prior to admission. -A-fib rate improved on amiodarone and patient noted to have converted to sinus rhythm. -BNP noted to be elevated at 523.6 on 08/10/2022. -Placed on Lasix 20 mg IV every 12 hours x 2 days. -Strict I's and O's, daily weights.  10.  Hypertension -Patient noted to have been on amlodipine 2.5 mg daily, metoprolol 25 mg twice daily, Lasix 20 mg q. OD prior to admission. -Amlodipine and Lasix held since admission. -Patient currently on metoprolol. -Placed on IV Lasix due to concerns for volume overload and monitor blood pressure closely.  11.  Hyperlipidemia -Noted to have been on aspirin and statin prior to admission. -Continue statin.  12.  COPD -Patient changed from albuterol to Xopenex nebs due to A-fib with RVR. -Placed on [place], Incruse.  13.  GERD -PPI.  14.  Impaired mobility -PT/OT.-  15.  Severe protein calorie malnutrition -Nutritional supplementation.    DVT prophylaxis: Heparin drip Code Status: Full Family Communication: Updated patient and daughter at bedside. Disposition: TBD  Status is: Inpatient Remains inpatient appropriate because: Severity of illness.  Increased O2 requirements.   Consultants:  Cardiology: Dr. Marlou Porch 08/12/2022  Procedures:  CT angiogram chest 08/10/2022 CT head 08/10/2022 Chest x-ray 08/10/2022, 08/13/2022 2D echo 08/11/2022 Lower extremity Dopplers 08/11/2022  Antimicrobials:  IV Unasyn 08/11/2022>>>> IV  Rocephin 08/10/2022>>> 08/11/2022   Subjective: Laying in bed.  Very hard of hearing per daughter who is at bedside.  Patient feels she is feeling a little bit better.  Denies any palpitations.  Feels shortness of breath is improving.  Denies any abdominal pains.  Per daughter appetite improving this morning.  Patient noted to be on 6 L nasal cannula with sats of 94%.  Noted to have converted back to sinus rhythm.  Patient asking when she can go home.  Objective: Vitals:   08/13/22 0400 08/13/22 0500 08/13/22 0600 08/13/22 0800  BP: (!) 104/45 116/71 120/61   Pulse: (!) 111 73 96   Resp: '16 17 16   '$ Temp:    (!) 97.5 F (36.4 C)  TempSrc:    Oral  SpO2: 93% 94% 95%   Weight:      Height:        Intake/Output Summary (Last 24 hours) at 08/13/2022 1147 Last data filed at 08/13/2022 0919 Gross per 24 hour  Intake 892.01 ml  Output 1350 ml  Net -457.99 ml   Filed Weights   08/10/22 1045 08/10/22 1711  Weight: 58.1 kg 57.8 kg    Examination:  General exam: Appears calm and comfortable  Respiratory system: Decreased breath sounds in the bases.  No wheezing noted.  Poor to fair air movement.   Cardiovascular system: Irregularly irregular no murmurs rubs or gallops.  No significant lower extremity edema.. Gastrointestinal system: Abdomen is nondistended, soft and nontender. No organomegaly or masses felt. Normal bowel sounds heard. Central nervous system: Alert and oriented. No focal neurological deficits. Extremities: Symmetric 5 x 5 power. Skin: No rashes, lesions or ulcers Psychiatry: Judgement and insight appear fair. Mood & affect appropriate.     Data Reviewed: I have personally reviewed following labs and imaging studies  CBC: Recent Labs  Lab 08/10/22 1103 08/11/22 0344 08/12/22 0324 08/13/22 0318  WBC 12.5* 16.0* 14.2* 11.0*  NEUTROABS 9.8*  --  11.1* 7.9*  HGB 11.8* 9.9* 10.8* 10.9*  HCT 37.0 31.3* 34.8* 35.6*  MCV 89.2 91.5 91.6 93.4  PLT 225 215 227 252     Basic Metabolic Panel: Recent Labs  Lab 08/10/22 1103 08/11/22 0344 08/12/22 0324 08/13/22 0318 08/13/22 0810  NA 135 138 135 139  --   K 4.0 4.1 3.8 4.3  --   CL 102 107 106 109  --   CO2 24 22 20* 22  --   GLUCOSE 139* 122* 120* 112*  --   BUN 51* 45* 48* 46*  --   CREATININE 1.22* 1.28* 1.36* 1.15*  --   CALCIUM 7.9* 7.6* 7.4* 7.4*  --   MG  --   --   --   --  1.9    GFR: Estimated Creatinine Clearance: 23.1 mL/min (A) (by C-G formula based on SCr of 1.15 mg/dL (H)).  Liver Function Tests: Recent Labs  Lab 08/10/22 1103  AST 28  ALT 18  ALKPHOS 64  BILITOT 0.4  PROT 6.5  ALBUMIN 2.8*    CBG: No results for input(s): "GLUCAP" in the last 168 hours.   Recent Results (from the past 240 hour(s))  Resp Panel by RT-PCR (Flu A&B, Covid)     Status: None   Collection Time: 08/10/22 11:53 AM   Specimen: Nasal Swab  Result Value Ref  Range Status   SARS Coronavirus 2 by RT PCR NEGATIVE NEGATIVE Final    Comment: (NOTE) SARS-CoV-2 target nucleic acids are NOT DETECTED.  The SARS-CoV-2 RNA is generally detectable in upper respiratory specimens during the acute phase of infection. The lowest concentration of SARS-CoV-2 viral copies this assay can detect is 138 copies/mL. A negative result does not preclude SARS-Cov-2 infection and should not be used as the sole basis for treatment or other patient management decisions. A negative result may occur with  improper specimen collection/handling, submission of specimen other than nasopharyngeal swab, presence of viral mutation(s) within the areas targeted by this assay, and inadequate number of viral copies(<138 copies/mL). A negative result must be combined with clinical observations, patient history, and epidemiological information. The expected result is Negative.  Fact Sheet for Patients:  EntrepreneurPulse.com.au  Fact Sheet for Healthcare Providers:   IncredibleEmployment.be  This test is no t yet approved or cleared by the [place] FDA and  has been authorized for detection and/or diagnosis of SARS-CoV-2 by FDA under an Emergency Use Authorization (EUA). This EUA will remain  in effect (meaning this test can be used) for the duration of the COVID-19 declaration under Section 564(b)(1) of the Act, 21 U.S.C.section 360bbb-3(b)(1), unless the authorization is terminated  or revoked sooner.       Influenza A by PCR NEGATIVE NEGATIVE Final   Influenza B by PCR NEGATIVE NEGATIVE Final    Comment: (NOTE) The Xpert Xpress SARS-CoV-2/FLU/RSV plus assay is intended as an aid in the diagnosis of influenza from Nasopharyngeal swab specimens and should not be used as a sole basis for treatment. Nasal washings and aspirates are unacceptable for Xpert Xpress SARS-CoV-2/FLU/RSV testing.  Fact Sheet for Patients: EntrepreneurPulse.com.au  Fact Sheet for Healthcare Providers: IncredibleEmployment.be  This test is not yet approved or cleared by the [place] FDA and has been authorized for detection and/or diagnosis of SARS-CoV-2 by FDA under an Emergency Use Authorization (EUA). This EUA will remain in effect (meaning this test can be used) for the duration of the COVID-19 declaration under Section 564(b)(1) of the Act, 21 U.S.C. section 360bbb-3(b)(1), unless the authorization is terminated or revoked.  Performed at [facility], [address] [place], [place] [address]   Urine Culture     Status: Abnormal   Collection Time: 08/10/22 11:54 AM   Specimen: Urine, Clean Catch  Result Value Ref Range Status   Specimen Description   Final    URINE, CLEAN CATCH Performed at [facility], [address] [place], [place] [address]    Special Requests   Final    NONE Performed at [facility], [address] [place], [place] [address]     Culture >=100,000 COLONIES/mL ENTEROCOCCUS FAECALIS (A)  Final   Report Status 08/12/2022 FINAL  Final   Organism ID, Bacteria ENTEROCOCCUS FAECALIS (A)  Final      Susceptibility   Enterococcus faecalis - MIC*    AMPICILLIN <=2 SENSITIVE Sensitive     NITROFURANTOIN <=16 SENSITIVE Sensitive     VANCOMYCIN 1 SENSITIVE Sensitive     * >=100,000 COLONIES/mL ENTEROCOCCUS FAECALIS  MRSA Next Gen by PCR, Nasal     Status: None   Collection Time: 08/10/22  5:52 PM   Specimen: Nasal Mucosa; Nasal Swab  Result Value Ref Range Status   MRSA by PCR Next Gen NOT DETECTED NOT DETECTED Final    Comment: (NOTE) The GeneXpert MRSA Assay (FDA approved  for NASAL specimens only), is one component of a comprehensive MRSA colonization surveillance program. It is not intended to diagnose MRSA infection nor to guide or monitor treatment for MRSA infections. Test performance is not FDA approved in patients less than 31 years old. Performed at [facility], [place] [address] [place], [place] [address]          Radiology Studies: DG CHEST PORT 1 VIEW  Result Date: 08/13/2022 CLINICAL DATA:  Hypoxia. EXAM: PORTABLE CHEST 1 VIEW COMPARISON:  August 10, 2022. FINDINGS: Stable cardiomediastinal silhouette. Significant increased bilateral lung opacities are noted concerning for worsening edema with mild bilateral pleural effusions. Bony thorax is unremarkable. IMPRESSION: Significant increased bilateral lung opacities are noted concerning for worsening edema with mild bilateral pleural effusions. Electronically Signed   By: Marijo Conception M.D.   On: 08/13/2022 08:48        Scheduled Meds:  amiodarone  200 mg Oral BID   Followed by   Derrill Memo ON 08/20/2022] amiodarone  200 mg Oral Daily   atorvastatin  40 mg Oral QHS   Chlorhexidine Gluconate Cloth  6 each Topical Daily   donepezil  10 mg Oral QHS   DULoxetine  30 mg Oral Daily   furosemide  20 mg Intravenous Q12H   gabapentin  100  mg Oral QHS   lactose free nutrition  237 mL Oral TID BM   metoprolol tartrate  50 mg Oral BID   mirtazapine  15 mg Oral QHS   mometasone-formoterol  2 puff Inhalation BID   pantoprazole  40 mg Oral Daily   senna  1 tablet Oral BID   umeclidinium bromide  1 puff Inhalation Daily   Continuous Infusions:  ampicillin-sulbactam (UNASYN) IV Stopped (08/13/22 0331)   heparin 850 Units/hr (08/13/22 0600)     LOS: 3 days    Time spent: 40 minutes    Irine Seal, MD [facility]   To contact the attending provider between 7A-7P or the covering provider during after hours 7P-7A, please log into the web site [email or web address] and access using universal [place] password for that web site. If you do not have the password, please call the hospital operator.  08/13/2022, 11:47 AM

## 2022-08-13 NOTE — Progress Notes (Signed)
ANTICOAGULATION CONSULT NOTE  Pharmacy Consult for Heparin Indication: pulmonary embolus  Allergies  Allergen Reactions   Ambrosia Trifida (Tall Ragweed) Allergy Skin Test Other (See Comments)    Other reaction(s): Other   Hydrochlorothiazide     hyponatremia   Lyrica [Pregabalin] Other (See Comments)    Severe confusion   Diltiazem Hcl Er Rash   Short Ragweed Pollen Ext     Other reaction(s): Other    Patient Measurements: Height: '5\' 2"'$  (157.5 cm) Weight: 57.8 kg (127 lb 6.8 oz) IBW/kg (Calculated) : 50.1 Heparin Dosing Weight: 57.2 kg  Vital Signs: Temp: 97.5 F (36.4 C) (11/22 0800) Temp Source: Oral (11/22 0800) BP: 120/61 (11/22 0600) Pulse Rate: 96 (11/22 0600)  Labs: Recent Labs    08/10/22 1103 08/10/22 1330 08/10/22 1506 08/10/22 2147 08/11/22 0344 08/11/22 0931 08/12/22 0324 08/12/22 1343 08/12/22 2236 08/13/22 0318 08/13/22 0923  HGB 11.8*  --   --   --  9.9*  --  10.8*  --   --  10.9*  --   HCT 37.0  --   --   --  31.3*  --  34.8*  --   --  35.6*  --   PLT 225  --   --   --  215  --  227  --   --  252  --   APTT 30  --   --  >200* 159*  --   --   --   --   --   --   LABPROT 13.9  --   --   --   --   --   --   --   --   --   --   INR 1.1  --   --   --   --   --   --   --   --   --   --   HEPARINUNFRC  --   --    < > >1.10* 1.07*   < > 0.28* 0.28* 0.26*  --  0.63  CREATININE 1.22*  --   --   --  1.28*  --  1.36*  --   --  1.15*  --   TROPONINIHS 16 13  --   --   --   --   --   --   --   --   --    < > = values in this interval not displayed.     Estimated Creatinine Clearance: 23.1 mL/min (A) (by C-G formula based on SCr of 1.15 mg/dL (H)).   Medications:  Medications Prior to Admission  Medication Sig Dispense Refill Last Dose   amLODipine (NORVASC) 2.5 MG tablet Take 2.5 mg by mouth daily.   Past Week   aspirin 81 MG tablet Take 81 mg by mouth daily.   Past Week   atorvastatin (LIPITOR) 40 MG tablet Take 40 mg by mouth daily.   Past Week    Calcium Citrate-Vitamin D (CALCIUM CITRATE + PO) Take 1 tablet 2 (two) times daily by mouth.   Past Week   denosumab (PROLIA) 60 MG/ML SOSY injection Inject 60 mg into the skin every 6 (six) months.   Unk   Diclofenac Sodium (VOLTAREN EX) Apply 1 Application topically as needed (pain).   Past Month   donepezil (ARICEPT) 10 MG tablet Take 1 tablet (10 mg total) by mouth at bedtime. 90 tablet 3 Past Week   DULoxetine (CYMBALTA) 30 MG capsule Take 30 mg  by mouth daily.   Past Week   furosemide (LASIX) 20 MG tablet Take 1 tablet (20 mg total) by mouth every other day. 90 tablet 2 Past Week   gabapentin (NEURONTIN) 100 MG capsule Take 100 mg by mouth at bedtime.   Past Week   levothyroxine (SYNTHROID) 125 MCG tablet Take 125 mcg by mouth in the morning. DAW1 Synthroid   Past Week   mirtazapine (REMERON) 15 MG tablet Take 15 mg by mouth at bedtime.   Past Week   Multiple Vitamins-Minerals (CENTRUM SILVER PO) Take 1 tablet by mouth daily.   Past Week   omeprazole (PRILOSEC) 40 MG capsule Take 1 capsule (40 mg total) by mouth 2 (two) times daily before a meal. (Patient taking differently: Take 40 mg by mouth daily.) 180 capsule 3 Past Week   metoprolol tartrate (LOPRESSOR) 25 MG tablet TAKE 1 TABLET BY MOUTH TWICE A DAY (Patient taking differently: Take 25 mg by mouth 2 (two) times daily.) 180 tablet 3 08/09/2022 at Night    Scheduled:   atorvastatin  40 mg Oral QHS   Chlorhexidine Gluconate Cloth  6 each Topical Daily   donepezil  10 mg Oral QHS   DULoxetine  30 mg Oral Daily   gabapentin  100 mg Oral QHS   lactose free nutrition  237 mL Oral TID BM   metoprolol tartrate  50 mg Oral BID   mirtazapine  15 mg Oral QHS   mometasone-formoterol  2 puff Inhalation BID   pantoprazole  40 mg Oral Daily   senna  1 tablet Oral BID   umeclidinium bromide  1 puff Inhalation Daily   Infusions:   amiodarone 30 mg/hr (08/13/22 0600)   ampicillin-sulbactam (UNASYN) IV Stopped (08/13/22 0331)   heparin 850  Units/hr (08/13/22 0600)   PRN: acetaminophen **OR** acetaminophen, diclofenac Sodium, hydrALAZINE, levalbuterol, LORazepam, nitroGLYCERIN, mouth rinse, oxyCODONE, polyethylene glycol, traZODone  Assessment: 89 yof with a history of HLD, HTN. Found to have new LLL PE on CTA with RH strain (may be chronic). Pharmacy consulted to dose IV heparin. Not on anticoagulants PTA.  Today, 08/13/2022: Heparin level now therapeutic after 1000 unit bolus and increase in heparin rate from 750 to 850 units/hr CBC: Hg stable, pltc WNL No bleeding or infusion issues per RN  Goal of Therapy:  Heparin level 0.3-0.7 units/ml Monitor platelets by anticoagulation protocol: Yes   Plan:  Continue heparin infusion at current rate of 850 units/hr Recheck confirmatory heparin level in 8 hr  Daily CBC & heparin level Monitor for s/s of bleeding or worsening thrombosis F/u plans for long-term anticoagulation (Eliquis preferred over Xarelto d/t renal impairment)   Adrian Saran, PharmD, BCPS Secure Chat if ?s or phone # provided via room assignments 08/13/2022 10:44 AM

## 2022-08-13 NOTE — Progress Notes (Signed)
PT Cancellation Note  Patient Details Name: Erin Ryan MRN: [id number] DOB: [birth year]   Cancelled Treatment:    Reason Eval/Treat Not Completed: Patient at procedure or test/unavailable, Attempted x to evaluate patient x 2 today and was busy with other staff. Will check back another time.  [place] Office [phone] Weekend pager-[phone]    Claretha Cooper 08/13/2022, 4:07 PM

## 2022-08-14 DIAGNOSIS — I4891 Unspecified atrial fibrillation: Secondary | ICD-10-CM | POA: Diagnosis not present

## 2022-08-14 DIAGNOSIS — I2699 Other pulmonary embolism without acute cor pulmonale: Secondary | ICD-10-CM | POA: Diagnosis not present

## 2022-08-14 DIAGNOSIS — N390 Urinary tract infection, site not specified: Secondary | ICD-10-CM | POA: Diagnosis not present

## 2022-08-14 DIAGNOSIS — R531 Weakness: Secondary | ICD-10-CM | POA: Diagnosis not present

## 2022-08-14 LAB — CBC WITH DIFFERENTIAL/PLATELET
Abs Immature Granulocytes: 0.12 10*3/uL — ABNORMAL HIGH (ref 0.00–0.07)
Basophils Absolute: 0.1 10*3/uL (ref 0.0–0.1)
Basophils Relative: 1 %
Eosinophils Absolute: 0.2 10*3/uL (ref 0.0–0.5)
Eosinophils Relative: 3 %
HCT: 34.2 % — ABNORMAL LOW (ref 36.0–46.0)
Hemoglobin: 10.7 g/dL — ABNORMAL LOW (ref 12.0–15.0)
Immature Granulocytes: 2 %
Lymphocytes Relative: 14 %
Lymphs Abs: 1.1 10*3/uL (ref 0.7–4.0)
MCH: 28.4 pg (ref 26.0–34.0)
MCHC: 31.3 g/dL (ref 30.0–36.0)
MCV: 90.7 fL (ref 80.0–100.0)
Monocytes Absolute: 0.9 10*3/uL (ref 0.1–1.0)
Monocytes Relative: 12 %
Neutro Abs: 5.3 10*3/uL (ref 1.7–7.7)
Neutrophils Relative %: 68 %
Platelets: 269 10*3/uL (ref 150–400)
RBC: 3.77 MIL/uL — ABNORMAL LOW (ref 3.87–5.11)
RDW: 14.7 % (ref 11.5–15.5)
WBC: 7.8 10*3/uL (ref 4.0–10.5)
nRBC: 0 % (ref 0.0–0.2)

## 2022-08-14 LAB — BASIC METABOLIC PANEL
Anion gap: 8 (ref 5–15)
BUN: 42 mg/dL — ABNORMAL HIGH (ref 8–23)
CO2: 24 mmol/L (ref 22–32)
Calcium: 7.7 mg/dL — ABNORMAL LOW (ref 8.9–10.3)
Chloride: 105 mmol/L (ref 98–111)
Creatinine, Ser: 1.12 mg/dL — ABNORMAL HIGH (ref 0.44–1.00)
GFR, Estimated: 45 mL/min — ABNORMAL LOW (ref >60)
Glucose, Bld: 109 mg/dL — ABNORMAL HIGH (ref 70–99)
Potassium: 4.2 mmol/L (ref 3.5–5.1)
Sodium: 137 mmol/L (ref 135–145)

## 2022-08-14 LAB — HEPARIN LEVEL (UNFRACTIONATED): Heparin Unfractionated: 0.46 IU/mL (ref 0.30–0.70)

## 2022-08-14 MED ORDER — METOPROLOL TARTRATE 50 MG PO TABS
75.0000 mg | ORAL_TABLET | Freq: Two times a day (BID) | ORAL | Status: DC
  Administered 2022-08-14 – 2022-08-20 (×13): 75 mg via ORAL
  Filled 2022-08-14: qty 1
  Filled 2022-08-14: qty 3
  Filled 2022-08-14: qty 1
  Filled 2022-08-14: qty 3
  Filled 2022-08-14 (×3): qty 1
  Filled 2022-08-14: qty 3
  Filled 2022-08-14 (×5): qty 1

## 2022-08-14 MED ORDER — AMIODARONE HCL IN DEXTROSE 360-4.14 MG/200ML-% IV SOLN
60.0000 mg/h | INTRAVENOUS | Status: DC
  Filled 2022-08-14: qty 200

## 2022-08-14 MED ORDER — AMIODARONE HCL 200 MG PO TABS
200.0000 mg | ORAL_TABLET | Freq: Two times a day (BID) | ORAL | Status: DC
  Administered 2022-08-14 – 2022-08-20 (×13): 200 mg via ORAL
  Filled 2022-08-14 (×13): qty 1

## 2022-08-14 MED ORDER — AMIODARONE LOAD VIA INFUSION
150.0000 mg | Freq: Once | INTRAVENOUS | Status: DC
  Filled 2022-08-14: qty 83.34

## 2022-08-14 MED ORDER — SODIUM CHLORIDE 0.9 % IV SOLN: INTRAVENOUS | Status: DC | PRN

## 2022-08-14 MED ORDER — AMIODARONE HCL IN DEXTROSE 360-4.14 MG/200ML-% IV SOLN
30.0000 mg/h | INTRAVENOUS | Status: DC
  Filled 2022-08-14: qty 200

## 2022-08-14 MED ORDER — AMOXICILLIN-POT CLAVULANATE 500-125 MG PO TABS
500.0000 mg | ORAL_TABLET | Freq: Two times a day (BID) | ORAL | Status: AC
  Administered 2022-08-14 – 2022-08-18 (×8): 1 via ORAL
  Filled 2022-08-14 (×8): qty 1

## 2022-08-14 NOTE — Progress Notes (Signed)
Patient has been in sitting in the patient chair  from 11am from until present. Patient has been repositioned and requested to stay in chair. Will continue to monitor.

## 2022-08-14 NOTE — Progress Notes (Signed)
Nurse reached out to provider to see if antibiotics can bridged to po. Patient has limited IV acces and currently has heparin infusing. See MAR for new orders. Will continue to monitor.

## 2022-08-14 NOTE — Progress Notes (Signed)
ANTICOAGULATION CONSULT NOTE  Pharmacy Consult for Heparin Indication: pulmonary embolus  Allergies  Allergen Reactions   Ambrosia Trifida (Tall Ragweed) Allergy Skin Test Other (See Comments)    Other reaction(s): Other   Hydrochlorothiazide     hyponatremia   Lyrica [Pregabalin] Other (See Comments)    Severe confusion   Diltiazem Hcl Er Rash   Short Ragweed Pollen Ext     Other reaction(s): Other    Patient Measurements: Height: '5\' 2"'$  (157.5 cm) Weight: 58.4 kg (128 lb 12 oz) IBW/kg (Calculated) : 50.1 Heparin Dosing Weight: 57.2 kg  Vital Signs: Temp: 97.3 F (36.3 C) (11/23 0800) Temp Source: Oral (11/23 0800) BP: 133/43 (11/23 0900) Pulse Rate: 74 (11/23 0900)  Labs: Recent Labs    08/12/22 0324 08/12/22 1343 08/13/22 0318 08/13/22 0923 08/13/22 1810 08/14/22 0319  HGB 10.8*  --  10.9*  --   --  10.7*  HCT 34.8*  --  35.6*  --   --  34.2*  PLT 227  --  252  --   --  269  HEPARINUNFRC 0.28*   < >  --  0.63 0.45 0.46  CREATININE 1.36*  --  1.15*  --   --  1.12*   < > = values in this interval not displayed.     Estimated Creatinine Clearance: 23.8 mL/min (A) (by C-G formula based on SCr of 1.12 mg/dL (H)).   Medications:  Medications Prior to Admission  Medication Sig Dispense Refill Last Dose   amLODipine (NORVASC) 2.5 MG tablet Take 2.5 mg by mouth daily.   Past Week   aspirin 81 MG tablet Take 81 mg by mouth daily.   Past Week   atorvastatin (LIPITOR) 40 MG tablet Take 40 mg by mouth daily.   Past Week   Calcium Citrate-Vitamin D (CALCIUM CITRATE + PO) Take 1 tablet 2 (two) times daily by mouth.   Past Week   denosumab (PROLIA) 60 MG/ML SOSY injection Inject 60 mg into the skin every 6 (six) months.   Unk   Diclofenac Sodium (VOLTAREN EX) Apply 1 Application topically as needed (pain).   Past Month   donepezil (ARICEPT) 10 MG tablet Take 1 tablet (10 mg total) by mouth at bedtime. 90 tablet 3 Past Week   DULoxetine (CYMBALTA) 30 MG capsule Take 30  mg by mouth daily.   Past Week   furosemide (LASIX) 20 MG tablet Take 1 tablet (20 mg total) by mouth every other day. 90 tablet 2 Past Week   gabapentin (NEURONTIN) 100 MG capsule Take 100 mg by mouth at bedtime.   Past Week   levothyroxine (SYNTHROID) 125 MCG tablet Take 125 mcg by mouth in the morning. DAW1 Synthroid   Past Week   mirtazapine (REMERON) 15 MG tablet Take 15 mg by mouth at bedtime.   Past Week   Multiple Vitamins-Minerals (CENTRUM SILVER PO) Take 1 tablet by mouth daily.   Past Week   omeprazole (PRILOSEC) 40 MG capsule Take 1 capsule (40 mg total) by mouth 2 (two) times daily before a meal. (Patient taking differently: Take 40 mg by mouth daily.) 180 capsule 3 Past Week   metoprolol tartrate (LOPRESSOR) 25 MG tablet TAKE 1 TABLET BY MOUTH TWICE A DAY (Patient taking differently: Take 25 mg by mouth 2 (two) times daily.) 180 tablet 3 08/09/2022 at Night    Scheduled:   amiodarone  200 mg Oral BID   atorvastatin  40 mg Oral QHS   Chlorhexidine Gluconate Cloth  6 each Topical Daily   donepezil  10 mg Oral QHS   DULoxetine  30 mg Oral Daily   furosemide  20 mg Intravenous Q12H   gabapentin  100 mg Oral QHS   lactose free nutrition  237 mL Oral TID BM   metoprolol tartrate  75 mg Oral BID   mirtazapine  15 mg Oral QHS   mometasone-formoterol  2 puff Inhalation BID   pantoprazole  40 mg Oral Daily   senna  1 tablet Oral BID   umeclidinium bromide  1 puff Inhalation Daily   Infusions:   sodium chloride 10 mL/hr at 08/14/22 0600   ampicillin-sulbactam (UNASYN) IV Stopped (08/14/22 0403)   heparin 850 Units/hr (08/14/22 0600)   PRN: sodium chloride, acetaminophen **OR** acetaminophen, diclofenac Sodium, hydrALAZINE, levalbuterol, LORazepam, nitroGLYCERIN, mouth rinse, oxyCODONE, polyethylene glycol, polyvinyl alcohol, traZODone  Assessment: 89 yof with a history of HLD, HTN. Found to have new LLL PE on CTA with RH strain (may be chronic). Pharmacy consulted to dose IV  heparin. Not on anticoagulants PTA.  Today, 08/14/2022: Heparin level remains therapeutic on heparin infusion at 850 units/hr CBC: Hg stable, pltc WNL No bleeding or infusion issues noted  Goal of Therapy:  Heparin level 0.3-0.7 units/ml Monitor platelets by anticoagulation protocol: Yes   Plan:  Continue heparin infusion at  850 units/hr Daily CBC & heparin level Monitor for s/s of bleeding or worsening thrombosis F/u plans for long-term anticoagulation (Eliquis preferred over Xarelto d/t renal impairment)   Kara Mead, PharmD, BCPS Clinical Pharmacist 08/14/2022 9:54 AM

## 2022-08-14 NOTE — Progress Notes (Addendum)
PROGRESS NOTE    Erin Ryan  IFO:[id number] DOB: December 10, 1924 DOA: 08/10/2022 PCP: Harlan Stains, MD    Chief Complaint  Patient presents with   Altered Mental Status    Brief Narrative:  Erin Ryan is a 88 y.o. female with PMH significant for dementia, HTN, HLD, CKD, COPD, GERD, hypothyroidism, hearing loss, anxiety, arthritis, compression fracture who lives at home with her daughter. 11/19, patient was brought to the ED at [facility] by her daughter with confusion, concerning for UTI.  Per daughter, patient had some urinary symptoms last week but was improved on its own.  For the last several weeks, patient is also having progressively worsening dyspnea on exertion.   In the ED, patient was afebrile, initially heart rate in 90s, blood pressure 130/53, breathing on room air  While in the ED, she started having chest pain.  She was noted to be in A-fib with RVR with heart rate up to 160 bpm.  She was given a dose of IV metoprolol 5 mg with conversion to normal sinus rhythm with heart rate in 70s Labs showed WBC count 12.5, hemoglobin 9.8, platelet 225, sodium 135, potassium 4, BUN/creatinine 51/1.22, glucose 139, BNP elevated 524, troponin normal, lactic acid level normal Respite virus panel negative for COVID, flu Urinalysis showed cloudy yellow urine with trace hemoglobin, large leukocytes, negative nitrate, few bacteria CT angio chest showed an acute PE with small thrombus burden in the subsegmental branch of the left lower lobe with RV strain.  It also showed small patchy densities in both mid and both lower lung fields suggesting atelectasis/pneumonia, minimal bilateral pleural effusion R>L. CT head with no acute findings. Patient was started on heparin drip.  Also empirically started on IV Rocephin. Admitted to [facility].  See below for details.   Patient's hospitalization has been complicated by persistent RVR likely precipitated by action, hypoxia as well as  hyperthyroidism.   Assessment & Plan:   Principal Problem:   Acute pulmonary embolism (HCC) Active Problems:   Essential hypertension   GERD   Chronic kidney disease (CKD), stage III (moderate) (HCC)   Adult hypothyroidism   Moderate dementia without behavioral disturbance, psychotic disturbance, mood disturbance, or anxiety (HCC)   Protein-calorie malnutrition, severe   Enterococcus UTI   Atrial fibrillation with RVR (HCC)   Generalized weakness   Urinary tract infection without hematuria  #1 acute pulmonary emboli -CT angiogram chest showed acute PE with small thrombus burden subsegmental branch of the left lower lobe with RV strain as well. -Lower extremity Dopplers done negative for DVT. -2D echo done with EF of 55 to 60%, NWMA, mild asymmetric LVH of the basal septal segment, grade 2 diastolic dysfunction, low normal right ventricular systolic function, right ventricular size mildly enlarged, mildly elevated pulmonary artery systolic pressure, mildly dilated right atrial size.  Mild MVR.  Mild mitral stenosis.  Moderate mitral annular calcification.  Mild thickening of aortic valve.  Moderate AVR. -Currently on heparin drip could likely transition to oral Eliquis in the next 24 hours if continued improvement with O2 requirements and clinical improvement.  2.  Acute respiratory failure with hypoxia -Multifactorial secondary to pneumonia, PE, A-fib with RVR, possible volume overload. -Patient with increasing O2 requirements on 6 L nasal cannula on 08/13/2022. -IV fluids discontinued. -Repeat chest x-ray (08/13/2022) with significant increased bilateral lung opacities noted concerning for worsening edema with mild bilateral pleural effusions. -Patient with decreased breath sounds noted on examination. -Volume overload likely secondary to A-fib with RVR. -Placed  on Lasix 20 mg IV every 12 hours x 2 days with urine output of 3.025 L over the past 24 hours.. -Continue IV antibiotics  for pneumonia.  Continue heparin for PE. -Was on amiodarone drip and transitioned to oral amiodarone per cardiology. -Supportive care.  3.  A-fib with RVR -Patient with no prior history of A-fib in the past. -Noted initially on admission in the ED, responded with IV metoprolol and placed on oral metoprolol. -Patient noted to have RVR recur and unable to place on Cardizem due to significant allergy, status post 1 dose IV digoxin and patient subsequently placed on amiodarone drip. -Cardiology consulted and patient transitioned from amiodarone drip to oral amiodarone on 08/13/2022, per cardiology. -Patient noted this morning to transiently go into A-fib with RVR while transferring to commode and went back into normal sinus rhythm while cardiology was assessing patient. -On IV heparin due to acute PE. -Likely transition to oral Eliquis if improvement with hypoxia and clinical improvement in the next 24 hours.  4.  Hyperthyroidism/history of hypothyroidism -Per family patient noted to have thyroid removal surgery 5 decades ago noted to be on Synthroid since then and Synthroid recently at 125 mcg's daily. -Unsure of last time Synthroid was adjusted. -TFTs obtained 08/11/2022 with a TSH significantly suppressed to < 0.01, free T4 elevated to 2.04. -Synthroid currently on hold. -Could likely discharge on a decreased dose of Synthroid with close outpatient follow-up with PCP.  5.  Mild bilateral pneumonia -CT chest done with small patchy densities in both mid and lower lung lobes suggestive of atelectasis/pneumonia, minimal bilateral pleural effusion R > L. -Currently afebrile. -Leukocytosis slowly trending down. -Continue IV Unasyn.  6.  Enterococcus faecalis UTI -Continue IV Unasyn.    7.  Acute metabolic encephalopathy/vascular dementia/anxiety -Presenting complaints with confusion likely multifactorial secondary to UTI, pneumonia in the setting of dementia. -Mental status improving daily.   Daughter who is at bedside. -Continue IV antibiotics, Aricept, Cymbalta, Neurontin, Remeron.  8.  AKI on CKD stage IIIa. -Baseline creatinine < 1. -Creatinine initially noted to be slowly trending up as high as 1.36. -IV fluids discontinued creatinine down to 1.15. -Patient started on IV Lasix due to concerns for volume overload, renal function continues to trend down with creatinine currently at 1.12. -Monitor renal function with diuresis.  9.  Acute on chronic diastolic CHF -Patient with increasing respiratory hypoxia and noted to be on 6 L nasal cannula oxygen 08/13/2022. -Acute CHF exacerbation likely triggered by A-fib with RVR. -Chest x-ray done, 08/13/2022 with significant increased bilateral lung opacities noted concerning for worsening edema with mild bilateral pleural effusion. -2D echo with EF of 55 to 48%, grade 2 diastolic dysfunction, mildly enlarged RV and mildly elevated PASP. -Patient noted to have been on Norvasc, metoprolol, Lasix prior to admission. -A-fib rate improved on amiodarone and patient noted to have converted to sinus rhythm yesterday, transiently converted back to A-fib with RVR early on this morning while being transferred to commode per cardiology.. -BNP noted to be elevated at 523.6 on 08/10/2022. -Placed on Lasix 20 mg IV every 12 hours with urine output of 3 L recorded in the past 24 hours and improved O2 requirements currently down to 1 to 2 L nasal cannula.   -Continue Lasix 20 mg IV every 12 hours for the next 24 hours.  -Strict I's and O's, daily weights. -Cardiology following.  10.  Hypertension -Patient noted to have been on amlodipine 2.5 mg daily, metoprolol 25 mg twice daily, Lasix 20  mg qod prior to admission. -Amlodipine and Lasix held since admission. -Patient currently on metoprolol and dose uptitrated this morning per cardiology. -Continue IV Lasix 20 mg every 12 hours for another day due to concerns for volume overload.   11.   Hyperlipidemia -Noted to have been on aspirin and statin prior to admission. -Continue statin.  12.  COPD -Patient changed from albuterol to Xopenex nebs due to A-fib with RVR. -Continue Incruse, Dulera.    13.  GERD -Continue PPI.  14.  Impaired mobility -Continue PT/OT.-  15.  Severe protein calorie malnutrition -Continue nutritional supplementation.  16.  Oropharyngeal ulcer on hard palate -Per daughter was not there 3 months ago. -Outpatient follow-up with oral surgeon/dentist.    DVT prophylaxis: Heparin drip Code Status: Full Family Communication: Updated patient and daughter at bedside. Disposition: TBD  Status is: Inpatient Remains inpatient appropriate because: Severity of illness.  Increased O2 requirements.   Consultants:  Cardiology: Dr. Marlou Porch 08/12/2022  Procedures:  CT angiogram chest 08/10/2022 CT head 08/10/2022 Chest x-ray 08/10/2022, 08/13/2022 2D echo 08/11/2022 Lower extremity Dopplers 08/11/2022  Antimicrobials:  IV Unasyn 08/11/2022>>>> IV Rocephin 08/10/2022>>> 08/11/2022   Subjective: Sitting up in bed.  Overall feeling better.  Currently on 1 to 2 L nasal cannula with sats of 95%.  Denies any chest pain.  Denies any significant shortness of breath.  Daughter at bedside.  Patient noted to have gone to bedside commode and guarding to rapid A-fib and when she was placed back in bed converted back to normal sinus rhythm.    Objective: Vitals:   08/14/22 0831 08/14/22 0832 08/14/22 0900 08/14/22 1007  BP:   (!) 133/43 (!) 131/45  Pulse:   74 92  Resp:   19   Temp:      TempSrc:      SpO2: 96% 96% 96%   Weight:      Height:        Intake/Output Summary (Last 24 hours) at 08/14/2022 1046 Last data filed at 08/14/2022 0800 Gross per 24 hour  Intake 705.48 ml  Output 2256 ml  Net -1550.52 ml    Filed Weights   08/10/22 1711 08/13/22 0747 08/14/22 0500  Weight: 57.8 kg 66.6 kg 58.4 kg    Examination:  General exam: NAD.   Pharyngeal ulcer/lesion also noted on hard palate Respiratory system: Some decreased breath sounds in the bases, improved air movement, no wheezing noted, no significant crackles.  Fair air movement.   Cardiovascular system: Regular rate rhythm no murmurs rubs or gallops.  No JVD.  No lower extremity edema.  Gastrointestinal system: Abdomen is soft, nontender, nondistended, positive bowel sounds.  No rebound.  No guarding. Central nervous system: Alert and oriented. No focal neurological deficits. Extremities: Symmetric 5 x 5 power. Skin: No rashes, lesions or ulcers Psychiatry: Judgement and insight appear fair.  Hard of hearing.  Mood & affect appropriate.     Data Reviewed: I have personally reviewed following labs and imaging studies  CBC: Recent Labs  Lab 08/10/22 1103 08/11/22 0344 08/12/22 0324 08/13/22 0318 08/14/22 0319  WBC 12.5* 16.0* 14.2* 11.0* 7.8  NEUTROABS 9.8*  --  11.1* 7.9* 5.3  HGB 11.8* 9.9* 10.8* 10.9* 10.7*  HCT 37.0 31.3* 34.8* 35.6* 34.2*  MCV 89.2 91.5 91.6 93.4 90.7  PLT 225 215 227 252 269     Basic Metabolic Panel: Recent Labs  Lab 08/10/22 1103 08/11/22 0344 08/12/22 0324 08/13/22 0318 08/13/22 0810 08/14/22 0319  NA 135 138 135  139  --  137  K 4.0 4.1 3.8 4.3  --  4.2  CL 102 107 106 109  --  105  CO2 24 22 20* 22  --  24  GLUCOSE 139* 122* 120* 112*  --  109*  BUN 51* 45* 48* 46*  --  42*  CREATININE 1.22* 1.28* 1.36* 1.15*  --  1.12*  CALCIUM 7.9* 7.6* 7.4* 7.4*  --  7.7*  MG  --   --   --   --  1.9  --      GFR: Estimated Creatinine Clearance: 23.8 mL/min (A) (by C-G formula based on SCr of 1.12 mg/dL (H)).  Liver Function Tests: Recent Labs  Lab 08/10/22 1103  AST 28  ALT 18  ALKPHOS 64  BILITOT 0.4  PROT 6.5  ALBUMIN 2.8*     CBG: No results for input(s): "GLUCAP" in the last 168 hours.   Recent Results (from the past 240 hour(s))  Resp Panel by RT-PCR (Flu A&B, Covid)     Status: None   Collection Time:  08/10/22 11:53 AM   Specimen: Nasal Swab  Result Value Ref Range Status   SARS Coronavirus 2 by RT PCR NEGATIVE NEGATIVE Final    Comment: (NOTE) SARS-CoV-2 target nucleic acids are NOT DETECTED.  The SARS-CoV-2 RNA is generally detectable in upper respiratory specimens during the acute phase of infection. The lowest concentration of SARS-CoV-2 viral copies this assay can detect is 138 copies/mL. A negative result does not preclude SARS-Cov-2 infection and should not be used as the sole basis for treatment or other patient management decisions. A negative result may occur with  improper specimen collection/handling, submission of specimen other than nasopharyngeal swab, presence of viral mutation(s) within the areas targeted by this assay, and inadequate number of viral copies(<138 copies/mL). A negative result must be combined with clinical observations, patient history, and epidemiological information. The expected result is Negative.  Fact Sheet for Patients:  EntrepreneurPulse.com.au  Fact Sheet for Healthcare Providers:  IncredibleEmployment.be  This test is no t yet approved or cleared by the [place] FDA and  has been authorized for detection and/or diagnosis of SARS-CoV-2 by FDA under an Emergency Use Authorization (EUA). This EUA will remain  in effect (meaning this test can be used) for the duration of the COVID-19 declaration under Section 564(b)(1) of the Act, 21 U.S.C.section 360bbb-3(b)(1), unless the authorization is terminated  or revoked sooner.       Influenza A by PCR NEGATIVE NEGATIVE Final   Influenza B by PCR NEGATIVE NEGATIVE Final    Comment: (NOTE) The Xpert Xpress SARS-CoV-2/FLU/RSV plus assay is intended as an aid in the diagnosis of influenza from Nasopharyngeal swab specimens and should not be used as a sole basis for treatment. Nasal washings and aspirates are unacceptable for Xpert Xpress  SARS-CoV-2/FLU/RSV testing.  Fact Sheet for Patients: EntrepreneurPulse.com.au  Fact Sheet for Healthcare Providers: IncredibleEmployment.be  This test is not yet approved or cleared by the [place] FDA and has been authorized for detection and/or diagnosis of SARS-CoV-2 by FDA under an Emergency Use Authorization (EUA). This EUA will remain in effect (meaning this test can be used) for the duration of the COVID-19 declaration under Section 564(b)(1) of the Act, 21 U.S.C. section 360bbb-3(b)(1), unless the authorization is terminated or revoked.  Performed at [facility], [address] [place], [place] [address]   Urine Culture     Status: Abnormal   Collection Time: 08/10/22 11:54  AM   Specimen: Urine, Clean Catch  Result Value Ref Range Status   Specimen Description   Final    URINE, CLEAN CATCH Performed at [facility], [address] [place], [place] [address]    Special Requests   Final    NONE Performed at [facility], [address] [place], [place] [address]    Culture >=100,000 COLONIES/mL ENTEROCOCCUS FAECALIS (A)  Final   Report Status 08/12/2022 FINAL  Final   Organism ID, Bacteria ENTEROCOCCUS FAECALIS (A)  Final      Susceptibility   Enterococcus faecalis - MIC*    AMPICILLIN <=2 SENSITIVE Sensitive     NITROFURANTOIN <=16 SENSITIVE Sensitive     VANCOMYCIN 1 SENSITIVE Sensitive     * >=100,000 COLONIES/mL ENTEROCOCCUS FAECALIS  MRSA Next Gen by PCR, Nasal     Status: None   Collection Time: 08/10/22  5:52 PM   Specimen: Nasal Mucosa; Nasal Swab  Result Value Ref Range Status   MRSA by PCR Next Gen NOT DETECTED NOT DETECTED Final    Comment: (NOTE) The GeneXpert MRSA Assay (FDA approved for NASAL specimens only), is one component of a comprehensive MRSA colonization surveillance program. It is not intended to diagnose MRSA infection nor to guide or monitor treatment for MRSA  infections. Test performance is not FDA approved in patients less than 57 years old. Performed at [facility], [place] [address] [place], [place] [address]          Radiology Studies: DG CHEST PORT 1 VIEW  Result Date: 08/13/2022 CLINICAL DATA:  Hypoxia. EXAM: PORTABLE CHEST 1 VIEW COMPARISON:  August 10, 2022. FINDINGS: Stable cardiomediastinal silhouette. Significant increased bilateral lung opacities are noted concerning for worsening edema with mild bilateral pleural effusions. Bony thorax is unremarkable. IMPRESSION: Significant increased bilateral lung opacities are noted concerning for worsening edema with mild bilateral pleural effusions. Electronically Signed   By: Marijo Conception M.D.   On: 08/13/2022 08:48        Scheduled Meds:  amiodarone  200 mg Oral BID   atorvastatin  40 mg Oral QHS   Chlorhexidine Gluconate Cloth  6 each Topical Daily   donepezil  10 mg Oral QHS   DULoxetine  30 mg Oral Daily   furosemide  20 mg Intravenous Q12H   gabapentin  100 mg Oral QHS   lactose free nutrition  237 mL Oral TID BM   metoprolol tartrate  75 mg Oral BID   mirtazapine  15 mg Oral QHS   mometasone-formoterol  2 puff Inhalation BID   pantoprazole  40 mg Oral Daily   senna  1 tablet Oral BID   umeclidinium bromide  1 puff Inhalation Daily   Continuous Infusions:  sodium chloride 10 mL/hr at 08/14/22 0600   ampicillin-sulbactam (UNASYN) IV Stopped (08/14/22 0403)   heparin 850 Units/hr (08/14/22 0600)     LOS: 4 days    Time spent: 40 minutes    Irine Seal, MD [facility]   To contact the attending provider between 7A-7P or the covering provider during after hours 7P-7A, please log into the web site [email or web address] and access using universal [place] password for that web site. If you do not have the password, please call the hospital operator.  08/14/2022, 10:46 AM

## 2022-08-14 NOTE — Progress Notes (Signed)
   Came back to assess, she is back in normal rhythm prior to administering the IV amiodarone again.  We will go ahead and place her back on the p.o. amiodarone 200 mg twice a day for the next 7 days and then 200 mg thereafter.  I will increase her metoprolol to 75 mg twice a day.  To recap, she went into atrial fibrillation with RVR after getting up to the commode and then promptly went back to sinus rhythm thereafter.  Discussed with nursing team and daughter.  Candee Furbish, MD

## 2022-08-14 NOTE — Progress Notes (Addendum)
Rounding Note    [name] Date of Encounter: 08/14/2022  [place] Cardiologist: None   Subjective   Saw her this morning as she was getting up to the commode with assistance.  On telemetry she was back in atrial fibrillation with rapid ventricular response.  Previously in sinus rhythm much of the day yesterday.  Daughter asking about oral pharyngeal ulcer on palate.  Patient states he thinks she is getting better.  Inpatient Medications    Scheduled Meds:  amiodarone  200 mg Oral BID   Followed by   Derrill Memo ON 08/20/2022] amiodarone  200 mg Oral Daily   atorvastatin  40 mg Oral QHS   Chlorhexidine Gluconate Cloth  6 each Topical Daily   donepezil  10 mg Oral QHS   DULoxetine  30 mg Oral Daily   furosemide  20 mg Intravenous Q12H   gabapentin  100 mg Oral QHS   lactose free nutrition  237 mL Oral TID BM   metoprolol tartrate  50 mg Oral BID   mirtazapine  15 mg Oral QHS   mometasone-formoterol  2 puff Inhalation BID   pantoprazole  40 mg Oral Daily   senna  1 tablet Oral BID   umeclidinium bromide  1 puff Inhalation Daily   Continuous Infusions:  sodium chloride 10 mL/hr at 08/14/22 0600   ampicillin-sulbactam (UNASYN) IV Stopped (08/14/22 0403)   heparin 850 Units/hr (08/14/22 0600)   PRN Meds: sodium chloride, acetaminophen **OR** acetaminophen, diclofenac Sodium, hydrALAZINE, levalbuterol, LORazepam, nitroGLYCERIN, mouth rinse, oxyCODONE, polyethylene glycol, polyvinyl alcohol, traZODone   Vital Signs    Vitals:   08/14/22 0200 08/14/22 0300 08/14/22 0400 08/14/22 0500  BP: (!) 113/34 110/65 (!) 129/34 (!) 111/52  Pulse: 68 (!) 35 69 66  Resp: (!) '21 20 19 17  '$ Temp:   98.1 F (36.7 C)   TempSrc:   Axillary   SpO2: 91% 92% 90% 96%  Weight:    58.4 kg  Height:        Intake/Output Summary (Last 24 hours) at 08/14/2022 0738 Last data filed at 08/14/2022 0600 Gross per 24 hour  Intake 856.06 ml  Output 2776 ml  Net  -1919.94 ml      08/14/2022    5:00 AM 08/13/2022    7:47 AM 08/10/2022    5:11 PM  Last 3 Weights  Weight (lbs) 128 lb 12 oz 146 lb 13.2 oz 127 lb 6.8 oz  Weight (kg) 58.4 kg 66.6 kg 57.8 kg      Telemetry    Sinus rhythm with occasional PACs, short burst of atrial tachycardia, previously atrial fibrillation, converted on 08/13/2022.  As I was in the room with her, she was back in atrial fibrillation with heart rates in the 120s to 130s.- Personally Reviewed  ECG    EKG yesterday morning atrial fibrillation well rate controlled- Personally Reviewed  Physical Exam   GEN: No acute distress.  Elderly Neck: No JVD Cardiac: Irregularly irregular tachycardic, no murmurs, rubs, or gallops.  Respiratory: Clear to auscultation bilaterally. GI: Soft, nontender, non-distended  MS: No edema; No deformity. Neuro:  Nonfocal  Psych: Confusion  Labs    High Sensitivity Troponin:   Recent Labs  Lab 08/10/22 1103 08/10/22 1330  TROPONINIHS 16 13     Chemistry Recent Labs  Lab 08/10/22 1103 08/11/22 0344 08/12/22 0324 08/13/22 0318 08/13/22 0810 08/14/22 0319  NA 135   < > 135 139  --  137  K 4.0   < >  3.8 4.3  --  4.2  CL 102   < > 106 109  --  105  CO2 24   < > 20* 22  --  24  GLUCOSE 139*   < > 120* 112*  --  109*  BUN 51*   < > 48* 46*  --  42*  CREATININE 1.22*   < > 1.36* 1.15*  --  1.12*  CALCIUM 7.9*   < > 7.4* 7.4*  --  7.7*  MG  --   --   --   --  1.9  --   PROT 6.5  --   --   --   --   --   ALBUMIN 2.8*  --   --   --   --   --   AST 28  --   --   --   --   --   ALT 18  --   --   --   --   --   ALKPHOS 64  --   --   --   --   --   BILITOT 0.4  --   --   --   --   --   GFRNONAA 41*   < > 36* 44*  --  45*  ANIONGAP 9   < > 9 8  --  8   < > = values in this interval not displayed.    Lipids No results for input(s): "CHOL", "TRIG", "HDL", "LABVLDL", "LDLCALC", "CHOLHDL" in the last 168 hours.  Hematology Recent Labs  Lab 08/12/22 0324 08/13/22 0318  08/14/22 0319  WBC 14.2* 11.0* 7.8  RBC 3.80* 3.81* 3.77*  HGB 10.8* 10.9* 10.7*  HCT 34.8* 35.6* 34.2*  MCV 91.6 93.4 90.7  MCH 28.4 28.6 28.4  MCHC 31.0 30.6 31.3  RDW 14.7 14.7 14.7  PLT 227 252 269   Thyroid  Recent Labs  Lab 08/11/22 0344 08/11/22 0931  TSH <0.010*  --   FREET4  --  2.04*    BNP Recent Labs  Lab 08/10/22 1103  BNP 523.6*    DDimer No results for input(s): "DDIMER" in the last 168 hours.   Radiology    DG CHEST PORT 1 VIEW  Result Date: 08/13/2022 CLINICAL DATA:  Hypoxia. EXAM: PORTABLE CHEST 1 VIEW COMPARISON:  August 10, 2022. FINDINGS: Stable cardiomediastinal silhouette. Significant increased bilateral lung opacities are noted concerning for worsening edema with mild bilateral pleural effusions. Bony thorax is unremarkable. IMPRESSION: Significant increased bilateral lung opacities are noted concerning for worsening edema with mild bilateral pleural effusions. Electronically Signed   By: Marijo Conception M.D.   On: 08/13/2022 08:48    Cardiac Studies   Echo from 08/11/22:   1. Left ventricular ejection fraction, by estimation, is 55 to 60%. The  left ventricle has normal function. The left ventricle has no regional  wall motion abnormalities. There is mild asymmetric left ventricular  hypertrophy of the basal-septal segment.  Left ventricular diastolic parameters are consistent with Grade II  diastolic dysfunction (pseudonormalization). Elevated left ventricular  end-diastolic pressure.   2. Right ventricular systolic function is low normal. The right  ventricular size is mildly enlarged. There is mildly elevated pulmonary  artery systolic pressure.   3. Right atrial size was mildly dilated.   4. The mitral valve is grossly normal. Mild mitral valve regurgitation.  Mild mitral stenosis. Moderate mitral annular calcification.   5. The aortic valve is tricuspid. There is mild  thickening of the aortic  valve. Aortic valve regurgitation is  moderate. Aortic valve sclerosis is  present, with no evidence of aortic valve stenosis.     Patient Profile     88 y.o. female  with a hx of dementia, HTN, HLD, CKD, GERD, tachycardia, chronic dyspnea, mild COPD, hypothyroidism, hearing loss, anxiety,  cardiology is following since 08/12/2022 for the evaluation of A fib RVR   Assessment & Plan    Paroxysmal atrial fibrillation newly diagnosed during this admission - Continuing treatment of infection as well as hyperthyroidism - Underlying COPD. - Now on p.o. amiodarone after converting to normal sinus rhythm with frequent PACs on 08/13/2022. -However while watching her on the commode just now she popped back into atrial fibrillation.  I will go ahead and switch her back to the IV amiodarone over the next day with another bolus of 150 to help suppress.  Hopefully tomorrow we can convert back to orals. - Keep mag greater than 2 and potassium greater than 4.  Chronic diastolic heart failure - Partially exacerbated by underlying atrial fibrillation episode. - IV Lasix low-dose 20 mg twice a day again today.  Good overall diuresis.  Watch for improved oxygenation.  Creatinine stable with diuresis.  Acute pulmonary embolism - This happens to be subsegmental and would not likely cause RV strain.  Her RV strain is likely secondary to underlying age/lung condition. - Transition to Eliquis 2.5 mg twice a day when able.  Dose adjusted for age, weight less than 60 kg.   Acute hypoxic respiratory failure Hyperthyroidism with hx of hypothyroidism  -TSH was less than 0.01, free T4 was mildly elevated at 2.04.  Per primary team.  Community acquired Pneumonia  Enterobacter faecalis UTI  Acute metabolic encephalopathy Dementia  AKI on CKD III Anemia  COPD GERD Debility  - per primary team    For questions or updates, please contact [place] Please consult [email or web address] for contact info under        Signed, Candee Furbish, MD   08/14/2022, 7:38 AM

## 2022-08-14 NOTE — Progress Notes (Signed)
Pt has had two runny stools this morning. Nurse has noted that the boost ordered for patient is lactulose free. Patient and family continues to request  chocolate milk for patient. Will continue to monitor.

## 2022-08-14 NOTE — Evaluation (Signed)
Physical Therapy Evaluation Patient Details Name: Erin Ryan MRN: [id number] DOB: 1925/07/08 Today's Date: 08/14/2022  History of Present Illness  Pt admitted from home with AMS and weakness and diagnosed with acute PE, afib with RVR, bil PNA, UTI, AKI on CKD3a, and acute metabolic encephalopathy.  Pt with hx of dementia, htn, CKD and COPD  Clinical Impression  Pt admitted as above and presenting with functional mobility limitations 2* generalized weakness, poor endurance and balance deficits.  Pt very cooperative and should progress to dc home with assist of family.  Pt would benefit from follow up HHPT to further address deficits and maximize IND and safety at home.     Recommendations for follow up therapy are one component of a multi-disciplinary discharge planning process, led by the attending physician.  Recommendations may be updated based on patient status, additional functional criteria and insurance authorization.  Follow Up Recommendations Home health PT      Assistance Recommended at Discharge Intermittent Supervision/Assistance  Patient can return home with the following  A little help with walking and/or transfers;A little help with bathing/dressing/bathroom;Assistance with cooking/housework;Assist for transportation;Help with stairs or ramp for entrance    Equipment Recommendations None recommended by PT  Recommendations for Other Services       Functional Status Assessment Patient has had a recent decline in their functional status and demonstrates the ability to make significant improvements in function in a reasonable and predictable amount of time.     Precautions / Restrictions Precautions Precautions: Fall Precaution Comments: monitor O2 sats Restrictions Weight Bearing Restrictions: No      Mobility  Bed Mobility               General bed mobility comments: pt up in chair with nursing and requests back to same    Transfers Overall transfer  level: Needs assistance Equipment used: Rolling walker (2 wheels) Transfers: Sit to/from Stand Sit to Stand: Min assist           General transfer comment: Steady assist with cues for use of UEs    Ambulation/Gait Ambulation/Gait assistance: Min assist Gait Distance (Feet): 56 Feet Assistive device: Rolling walker (2 wheels) Gait Pattern/deviations: Step-to pattern, Step-through pattern, Decreased step length - right, Decreased step length - left, Shuffle, Trunk flexed       General Gait Details: cues for posture and position from RW; distance ltd by fatigue  Stairs            Wheelchair Mobility    Modified Rankin (Stroke Patients Only)       Balance Overall balance assessment: Needs assistance Sitting-balance support: No upper extremity supported, Feet supported Sitting balance-Leahy Scale: Fair     Standing balance support: Bilateral upper extremity supported Standing balance-Leahy Scale: Poor                               Pertinent Vitals/Pain Pain Assessment Pain Assessment: No/denies pain    Home Living Family/patient expects to be discharged to:: Private residence Living Arrangements: Children Available Help at Discharge: Family;Available 24 hours/day Type of Home: House Home Access: Stairs to enter   [employer] of Steps: 1   Home Layout: One level Home Equipment: Conservation officer, nature (2 wheels);Cane - single point      Prior Function Prior Level of Function : Independent/Modified Independent             Mobility Comments: occional use of cane; pt refused to  use RW       Hand Dominance        Extremity/Trunk Assessment   Upper Extremity Assessment Upper Extremity Assessment: Generalized weakness    Lower Extremity Assessment Lower Extremity Assessment: Generalized weakness    Cervical / Trunk Assessment Cervical / Trunk Assessment: Kyphotic  Communication   Communication: HOH  Cognition  Arousal/Alertness: Awake/alert Behavior During Therapy: WFL for tasks assessed/performed Overall Cognitive Status: History of cognitive impairments - at baseline                                          General Comments      Exercises     Assessment/Plan    PT Assessment Patient needs continued PT services  PT Problem List Decreased strength;Decreased range of motion;Decreased activity tolerance;Decreased balance;Decreased mobility;Decreased knowledge of use of DME       PT Treatment Interventions DME instruction;Gait training;Stair training;Functional mobility training;Therapeutic activities;Therapeutic exercise;Balance training;Patient/family education    PT Goals (Current goals can be found in the Care Plan section)  Acute Rehab PT Goals Patient Stated Goal: HOME PT Goal Formulation: With patient Time For Goal Achievement: 08/28/22 Potential to Achieve Goals: Good    Frequency Min 3X/week     Co-evaluation               AM-PAC PT "6 Clicks" Mobility  Outcome Measure Help needed turning from your back to your side while in a flat bed without using bedrails?: A Little Help needed moving from lying on your back to sitting on the side of a flat bed without using bedrails?: A Little Help needed moving to and from a bed to a chair (including a wheelchair)?: A Little Help needed standing up from a chair using your arms (e.g., wheelchair or bedside chair)?: A Little Help needed to walk in hospital room?: A Little Help needed climbing 3-5 steps with a railing? : A Lot 6 Click Score: 17    End of Session Equipment Utilized During Treatment: Gait belt;Oxygen Activity Tolerance: Patient tolerated treatment well;Patient limited by fatigue Patient left: in chair;with call bell/phone within reach;with chair alarm set;with family/visitor present Nurse Communication: Mobility status PT Visit Diagnosis: Difficulty in walking, not elsewhere classified  (R26.2);Unsteadiness on feet (R26.81);Muscle weakness (generalized) (M62.81)    Time: 2094-7096 PT Time Calculation (min) (ACUTE ONLY): 21 min   Charges:   PT Evaluation $PT Eval Low Complexity: 1 Low          [place] Pager [phone] Office [phone]   Sybel Standish 08/14/2022, 3:33 PM

## 2022-08-15 DIAGNOSIS — R531 Weakness: Secondary | ICD-10-CM | POA: Diagnosis not present

## 2022-08-15 DIAGNOSIS — I4891 Unspecified atrial fibrillation: Secondary | ICD-10-CM | POA: Diagnosis not present

## 2022-08-15 DIAGNOSIS — I2699 Other pulmonary embolism without acute cor pulmonale: Secondary | ICD-10-CM | POA: Diagnosis not present

## 2022-08-15 DIAGNOSIS — N390 Urinary tract infection, site not specified: Secondary | ICD-10-CM | POA: Diagnosis not present

## 2022-08-15 LAB — BASIC METABOLIC PANEL
Anion gap: 10 (ref 5–15)
BUN: 46 mg/dL — ABNORMAL HIGH (ref 8–23)
CO2: 25 mmol/L (ref 22–32)
Calcium: 8.4 mg/dL — ABNORMAL LOW (ref 8.9–10.3)
Chloride: 104 mmol/L (ref 98–111)
Creatinine, Ser: 1.29 mg/dL — ABNORMAL HIGH (ref 0.44–1.00)
GFR, Estimated: 38 mL/min — ABNORMAL LOW (ref >60)
Glucose, Bld: 102 mg/dL — ABNORMAL HIGH (ref 70–99)
Potassium: 4.2 mmol/L (ref 3.5–5.1)
Sodium: 139 mmol/L (ref 135–145)

## 2022-08-15 LAB — CBC
HCT: 35 % — ABNORMAL LOW (ref 36.0–46.0)
Hemoglobin: 11 g/dL — ABNORMAL LOW (ref 12.0–15.0)
MCH: 28.2 pg (ref 26.0–34.0)
MCHC: 31.4 g/dL (ref 30.0–36.0)
MCV: 89.7 fL (ref 80.0–100.0)
Platelets: 301 10*3/uL (ref 150–400)
RBC: 3.9 MIL/uL (ref 3.87–5.11)
RDW: 15 % (ref 11.5–15.5)
WBC: 8.4 10*3/uL (ref 4.0–10.5)
nRBC: 0 % (ref 0.0–0.2)

## 2022-08-15 LAB — HEPARIN LEVEL (UNFRACTIONATED): Heparin Unfractionated: 0.68 IU/mL (ref 0.30–0.70)

## 2022-08-15 LAB — MAGNESIUM: Magnesium: 2 mg/dL (ref 1.7–2.4)

## 2022-08-15 MED ORDER — PHENOL 1.4 % MT LIQD
1.0000 | OROMUCOSAL | Status: DC | PRN
  Administered 2022-08-19: 1 via OROMUCOSAL
  Filled 2022-08-15: qty 177

## 2022-08-15 MED ORDER — APIXABAN 2.5 MG PO TABS
2.5000 mg | ORAL_TABLET | Freq: Two times a day (BID) | ORAL | Status: DC
  Administered 2022-08-15 – 2022-08-20 (×11): 2.5 mg via ORAL
  Filled 2022-08-15 (×11): qty 1

## 2022-08-15 NOTE — Progress Notes (Signed)
Rounding Note    [name] Date of Encounter: 08/15/2022  [facility] Cardiologist: None   Subjective   Overall doing well.  Pleasant.  Had bowel movement which decreased her agitation overnight.  No further evidence of atrial flutter.  Daughter at bedside.  Inpatient Medications    Scheduled Meds:  amiodarone  200 mg Oral BID   amoxicillin-clavulanate  500 mg Oral Q12H   apixaban  2.5 mg Oral BID   atorvastatin  40 mg Oral QHS   Chlorhexidine Gluconate Cloth  6 each Topical Daily   donepezil  10 mg Oral QHS   DULoxetine  30 mg Oral Daily   gabapentin  100 mg Oral QHS   lactose free nutrition  237 mL Oral TID BM   metoprolol tartrate  75 mg Oral BID   mirtazapine  15 mg Oral QHS   mometasone-formoterol  2 puff Inhalation BID   pantoprazole  40 mg Oral Daily   umeclidinium bromide  1 puff Inhalation Daily   Continuous Infusions:  sodium chloride Stopped (08/14/22 0601)   PRN Meds: sodium chloride, acetaminophen **OR** acetaminophen, diclofenac Sodium, hydrALAZINE, levalbuterol, LORazepam, nitroGLYCERIN, mouth rinse, oxyCODONE, polyethylene glycol, polyvinyl alcohol, traZODone   Vital Signs    Vitals:   08/15/22 0700 08/15/22 0800 08/15/22 0804 08/15/22 0805  BP:  (!) 166/52    Pulse:  68    Resp:  20    Temp: (!) 97.2 F (36.2 C)     TempSrc: Axillary     SpO2:  96% 98% 98%  Weight:      Height:        Intake/Output Summary (Last 24 hours) at 08/15/2022 0915 Last data filed at 08/15/2022 0800 Gross per 24 hour  Intake 577.58 ml  Output 2530 ml  Net -1952.42 ml      08/14/2022    5:00 AM 08/13/2022    7:47 AM 08/10/2022    5:11 PM  Last 3 Weights  Weight (lbs) 128 lb 12 oz 146 lb 13.2 oz 127 lb 6.8 oz  Weight (kg) 58.4 kg 66.6 kg 57.8 kg      Telemetry    Sinus rhythm- Personally Reviewed  ECG    No new- Personally Reviewed  Physical Exam   GEN: No acute distress.  Elderly Neck: No JVD Cardiac: RRR, no  murmurs, rubs, or gallops.  Respiratory: Clear to auscultation bilaterally. GI: Soft, nontender, non-distended  MS: No edema; No deformity. Neuro:  Nonfocal  Psych: Confused but pleasant  Labs    High Sensitivity Troponin:   Recent Labs  Lab 08/10/22 1103 08/10/22 1330  TROPONINIHS 16 13     Chemistry Recent Labs  Lab 08/10/22 1103 08/11/22 0344 08/13/22 0318 08/13/22 0810 08/14/22 0319 08/15/22 0307  NA 135   < > 139  --  137 139  K 4.0   < > 4.3  --  4.2 4.2  CL 102   < > 109  --  105 104  CO2 24   < > 22  --  24 25  GLUCOSE 139*   < > 112*  --  109* 102*  BUN 51*   < > 46*  --  42* 46*  CREATININE 1.22*   < > 1.15*  --  1.12* 1.29*  CALCIUM 7.9*   < > 7.4*  --  7.7* 8.4*  MG  --   --   --  1.9  --  2.0  PROT 6.5  --   --   --   --   --  ALBUMIN 2.8*  --   --   --   --   --   AST 28  --   --   --   --   --   ALT 18  --   --   --   --   --   ALKPHOS 64  --   --   --   --   --   BILITOT 0.4  --   --   --   --   --   GFRNONAA 41*   < > 44*  --  45* 38*  ANIONGAP 9   < > 8  --  8 10   < > = values in this interval not displayed.    Lipids No results for input(s): "CHOL", "TRIG", "HDL", "LABVLDL", "LDLCALC", "CHOLHDL" in the last 168 hours.  Hematology Recent Labs  Lab 08/13/22 0318 08/14/22 0319 08/15/22 0307  WBC 11.0* 7.8 8.4  RBC 3.81* 3.77* 3.90  HGB 10.9* 10.7* 11.0*  HCT 35.6* 34.2* 35.0*  MCV 93.4 90.7 89.7  MCH 28.6 28.4 28.2  MCHC 30.6 31.3 31.4  RDW 14.7 14.7 15.0  PLT 252 269 301   Thyroid  Recent Labs  Lab 08/11/22 0344 08/11/22 0931  TSH <0.010*  --   FREET4  --  2.04*    BNP Recent Labs  Lab 08/10/22 1103  BNP 523.6*    DDimer No results for input(s): "DDIMER" in the last 168 hours.   Radiology    No results found.  Cardiac Studies   Echocardiogram 08/11/2022 1. Left ventricular ejection fraction, by estimation, is 55 to 60%. The  left ventricle has normal function. The left ventricle has no regional  wall motion  abnormalities. There is mild asymmetric left ventricular  hypertrophy of the basal-septal segment.  Left ventricular diastolic parameters are consistent with Grade II  diastolic dysfunction (pseudonormalization). Elevated left ventricular  end-diastolic pressure.   2. Right ventricular systolic function is low normal. The right  ventricular size is mildly enlarged. There is mildly elevated pulmonary  artery systolic pressure.   3. Right atrial size was mildly dilated.   4. The mitral valve is grossly normal. Mild mitral valve regurgitation.  Mild mitral stenosis. Moderate mitral annular calcification.   5. The aortic valve is tricuspid. There is mild thickening of the aortic  valve. Aortic valve regurgitation is moderate. Aortic valve sclerosis is  present, with no evidence of aortic valve stenosis.   Patient Profile     88 y.o. female with a hx of dementia, HTN, HLD, CKD, GERD, tachycardia, chronic dyspnea, mild COPD, hypothyroidism, hearing loss, anxiety,  who is being seen for the evaluation of A fib RVR   Assessment & Plan    Newly Diagnosed Paroxysmal Atrial Fibrillation  - New this admission. Suspect this was triggered by infection (PNA), underlying COPD, and hyperthyroidism  - Patient was treated with IV amiodarone and converted back to NSR with frequent PACs on 08/13/2022. She was transitioned to PO amiodarone. Had a brief reoccurrence of afib yesterday, but promptly went back into NSR   - Patient is on [facility] for treatment of PE. She should be discharged on low-dose eliquis (2.5 mg BID) for at least the next 6 months given acute PE. After 6 months, it would be reasonable to stop eliquis given patient's age and dementia. - Maintain K>4, Mag>2 - Continue PO amiodarone 200 mg BID for 7 days, followed by PO amiodarone 200 mg daily  - Continue metoprolol  75 mg BID     Chronic diastolic heart failure  - Echo this admission showed EF 55-60%, no regional wall motion abnormalities, grade II  diastolic dysfunction  - CHF was exacerbated by atrial fibrillation  - Patient has been diuresing on IV lasix 20 mg BID. Output 2.6 L urine yesterday. Creatinine increased from 1.12>>1.29  - Hold further diuresis here.  On discharge okay to go back to home meds 20 mg furosemide every other day  Acute Pulmonary Embolism - CT showed an acute PE with small thrombus burden in subsegmental branch in left lower lobe. Also noted RV strain  - As PE is subsegmental and small, it is unlikely that this would cause RV strain.  Community acquired Pneumonia  Enterobacter faecalis UTI  Acute metabolic encephalopathy Dementia  AKI on CKD III Anemia  COPD GERD Debility  - per primary team   We will go ahead and sign off.    For questions or updates, please contact [place] Please consult [email or web address] for contact info under       Candee Furbish, MD

## 2022-08-15 NOTE — Progress Notes (Signed)
[facility] for Heparin >> changing to apixaban 11/24 Indication: pulmonary embolus  Allergies  Allergen Reactions   Ambrosia Trifida (Tall Ragweed) Allergy Skin Test Other (See Comments)    Other reaction(s): Other   Hydrochlorothiazide     hyponatremia   Lyrica [Pregabalin] Other (See Comments)    Severe confusion   Diltiazem Hcl Er Rash   Short Ragweed Pollen Ext     Other reaction(s): Other    Patient Measurements: Height: '5\' 2"'$  (157.5 cm) Weight: 58.4 kg (128 lb 12 oz) IBW/kg (Calculated) : 50.1 Heparin Dosing Weight: 57.2 kg  Vital Signs: Temp: 97.2 F (36.2 C) (11/24 0700) Temp Source: Axillary (11/24 0700) BP: 153/126 (11/24 0600) Pulse Rate: 65 (11/24 0600)  Labs: Recent Labs    08/13/22 0318 08/13/22 0923 08/13/22 1810 08/14/22 0319 08/15/22 0307  HGB 10.9*  --   --  10.7* 11.0*  HCT 35.6*  --   --  34.2* 35.0*  PLT 252  --   --  269 301  HEPARINUNFRC  --    < > 0.45 0.46 0.68  CREATININE 1.15*  --   --  1.12* 1.29*   < > = values in this interval not displayed.     Estimated Creatinine Clearance: 20.6 mL/min (A) (by C-G formula based on SCr of 1.29 mg/dL (H)).   Medications:  Medications Prior to Admission  Medication Sig Dispense Refill Last Dose   amLODipine (NORVASC) 2.5 MG tablet Take 2.5 mg by mouth daily.   Past Week   aspirin 81 MG tablet Take 81 mg by mouth daily.   Past Week   atorvastatin (LIPITOR) 40 MG tablet Take 40 mg by mouth daily.   Past Week   Calcium Citrate-Vitamin D (CALCIUM CITRATE + PO) Take 1 tablet 2 (two) times daily by mouth.   Past Week   denosumab (PROLIA) 60 MG/ML SOSY injection Inject 60 mg into the skin every 6 (six) months.   Unk   Diclofenac Sodium (VOLTAREN EX) Apply 1 Application topically as needed (pain).   Past Month   donepezil (ARICEPT) 10 MG tablet Take 1 tablet (10 mg total) by mouth at bedtime. 90 tablet 3 Past Week   DULoxetine (CYMBALTA) 30 MG capsule Take 30 mg by  mouth daily.   Past Week   furosemide (LASIX) 20 MG tablet Take 1 tablet (20 mg total) by mouth every other day. 90 tablet 2 Past Week   gabapentin (NEURONTIN) 100 MG capsule Take 100 mg by mouth at bedtime.   Past Week   levothyroxine (SYNTHROID) 125 MCG tablet Take 125 mcg by mouth in the morning. DAW1 Synthroid   Past Week   mirtazapine (REMERON) 15 MG tablet Take 15 mg by mouth at bedtime.   Past Week   Multiple Vitamins-Minerals (CENTRUM SILVER PO) Take 1 tablet by mouth daily.   Past Week   omeprazole (PRILOSEC) 40 MG capsule Take 1 capsule (40 mg total) by mouth 2 (two) times daily before a meal. (Patient taking differently: Take 40 mg by mouth daily.) 180 capsule 3 Past Week   metoprolol tartrate (LOPRESSOR) 25 MG tablet TAKE 1 TABLET BY MOUTH TWICE A DAY (Patient taking differently: Take 25 mg by mouth 2 (two) times daily.) 180 tablet 3 08/09/2022 at Night    Scheduled:   amiodarone  200 mg Oral BID   amoxicillin-clavulanate  500 mg Oral Q12H   atorvastatin  40 mg Oral QHS   Chlorhexidine Gluconate Cloth  6 each  Topical Daily   donepezil  10 mg Oral QHS   DULoxetine  30 mg Oral Daily   gabapentin  100 mg Oral QHS   lactose free nutrition  237 mL Oral TID BM   metoprolol tartrate  75 mg Oral BID   mirtazapine  15 mg Oral QHS   mometasone-formoterol  2 puff Inhalation BID   pantoprazole  40 mg Oral Daily   umeclidinium bromide  1 puff Inhalation Daily   Infusions:   sodium chloride 10 mL/hr at 08/14/22 0600   heparin 850 Units/hr (08/14/22 0600)   PRN: sodium chloride, acetaminophen **OR** acetaminophen, diclofenac Sodium, hydrALAZINE, levalbuterol, LORazepam, nitroGLYCERIN, mouth rinse, oxyCODONE, polyethylene glycol, polyvinyl alcohol, traZODone  Assessment: 89 yof with a history of HLD, HTN. Found to have new LLL PE on CTA with RH strain (may be chronic). Pharmacy consulted to dose IV heparin. Not on anticoagulants PTA.  Today, 08/15/2022: Heparin level still therapeutic,  plan change heparin to apixaban this AM CBC: Hg stable, pltc WNL No bleeding or infusion issues noted  Goal of Therapy:  Heparin level 0.3-0.7 units/ml Monitor platelets by anticoagulation protocol: Yes   Plan:  Discontinue IV Heparin Daily CBC Monitor for s/s of bleeding or worsening thrombosis To start apixaban this AM. Per cards recommendations and confirmed with Dr. Marlou Porch as well, will start apixaban 2.'5mg'$  BID given patient's advanced age and renal function rather than typical apixaban PE dosing Will educate prior to discharge   Kara Mead, PharmD, BCPS Clinical Pharmacist 08/15/2022 8:33 AM

## 2022-08-15 NOTE — Evaluation (Signed)
Occupational Therapy Evaluation Patient Details Name: Erin Ryan MRN: [id number] DOB: 30-Aug-1925 Today's Date: 08/15/2022   History of Present Illness Pt admitted from home with AMS and weakness and diagnosed with acute PE, afib with RVR, bil PNA, UTI, AKI on CKD3a, and acute metabolic encephalopathy.  Pt with hx of dementia, htn, CKD and COPD   Clinical Impression   Erin Ryan is a 88 year old woman admitted to hospital with above medical history. Overall she is min guard to ambulate with walker in the room and min assist for ADLs. She exhibits functional upper body strength but does have arthritic changes in hands that limit coordination. Patient able to tolerate in room ambulation on RA with o2 sat staying in the 90s. Patient's daughter appears to be overwhelmed and tired from being a prolonged caregiver. Recommend case management provided resources for PCA to assist at home. Recommend [facility] OT services at discharge. OT will follow acutely while in hospital.      Recommendations for follow up therapy are one component of a multi-disciplinary discharge planning process, led by the attending physician.  Recommendations may be updated based on patient status, additional functional criteria and insurance authorization.   Follow Up Recommendations  Home health OT     Assistance Recommended at Discharge Intermittent Supervision/Assistance  Patient can return home with the following A little help with walking and/or transfers;A little help with bathing/dressing/bathroom;Assistance with cooking/housework;Direct supervision/assist for financial management;Direct supervision/assist for medications management;Help with stairs or ramp for entrance    Functional Status Assessment  Patient has had a recent decline in their functional status and demonstrates the ability to make significant improvements in function in a reasonable and predictable amount of time.  Equipment Recommendations  None  recommended by OT    Recommendations for Other Services       Precautions / Restrictions Precautions Precautions: Fall Precaution Comments: monitor O2 sats Restrictions Weight Bearing Restrictions: No      Mobility Bed Mobility Overal bed mobility: Needs Assistance Bed Mobility: Supine to Sit     Supine to sit: Min guard, HOB elevated     General bed mobility comments: Min guard for supine to sit. Bottom of bed was inflated.    Transfers Overall transfer level: Needs assistance Equipment used: Rolling walker (2 wheels) Transfers: Sit to/from Stand Sit to Stand: Min guard           General transfer comment: Min guard to stand and walk around the bed with walker. No overt loss of balance. Needed assistance to manage lines. o2 sat WLF on RA      Balance Overall balance assessment: Mild deficits observed, not formally tested                                         ADL either performed or assessed with clinical judgement   ADL Overall ADL's : Needs assistance/impaired Eating/Feeding: Set up;Sitting   Grooming: Sitting;Set up   Upper Body Bathing: Set up;Sitting   Lower Body Bathing: Minimal assistance;Sit to/from stand   Upper Body Dressing : Minimal assistance;Sitting Upper Body Dressing Details (indicate cue type and reason): for fasteners Lower Body Dressing: Minimal assistance;Sit to/from stand   Toilet Transfer: Financial planner (2 wheels)   Toileting- Water quality scientist and Hygiene: Minimal assistance;Sit to/from stand       Functional mobility during ADLs: Min guard;Rolling walker (2 wheels)  Vision   Vision Assessment?: No apparent visual deficits     Perception     Praxis      Pertinent Vitals/Pain Pain Assessment Pain Assessment: No/denies pain     Hand Dominance Right   Extremity/Trunk Assessment Upper Extremity Assessment Upper Extremity Assessment: Overall WFL for tasks  assessed (arthritic changes in hands)   Lower Extremity Assessment Lower Extremity Assessment: Defer to PT evaluation   Cervical / Trunk Assessment Cervical / Trunk Assessment: Kyphotic   Communication Communication Communication: HOH   Cognition Arousal/Alertness: Awake/alert Behavior During Therapy: WFL for tasks assessed/performed Overall Cognitive Status: History of cognitive impairments - at baseline                                 General Comments: Pleasant. Able to follow commands. HOH makes assessing cognition difficult     General Comments       Exercises     Shoulder Instructions      Home Living Family/patient expects to be discharged to:: Private residence Living Arrangements: Children Available Help at Discharge: Family;Available 24 hours/day Type of Home: House Home Access: Stairs to enter [employer] of Steps: 1   Home Layout: One level               Home Equipment: Conservation officer, nature (2 wheels);Cane - single point          Prior Functioning/Environment Prior Level of Function : Independent/Modified Independent             Mobility Comments: occional use of cane; pt refused to use RW ADLs Comments: performs sink baths, intermittent assistance        OT Problem List: Decreased strength;Decreased range of motion;Decreased activity tolerance;Impaired balance (sitting and/or standing);Decreased knowledge of use of DME or AE;Impaired UE functional use      OT Treatment/Interventions: Self-care/ADL training;Therapeutic exercise;DME and/or AE instruction;Therapeutic activities;Balance training;Patient/family education    OT Goals(Current goals can be found in the care plan section) Acute Rehab OT Goals OT Goal Formulation: With family Time For Goal Achievement: 08/29/22 Potential to Achieve Goals: Good  OT Frequency: Min 2X/week    Co-evaluation              AM-PAC OT "6 Clicks" Daily Activity     Outcome  Measure Help from another person eating meals?: A Little Help from another person taking care of personal grooming?: A Little Help from another person toileting, which includes using toliet, bedpan, or urinal?: A Little Help from another person bathing (including washing, rinsing, drying)?: A Little Help from another person to put on and taking off regular upper body clothing?: A Little Help from another person to put on and taking off regular lower body clothing?: A Little 6 Click Score: 18   End of Session Equipment Utilized During Treatment: Rolling walker (2 wheels);Oxygen Nurse Communication: Mobility status  Activity Tolerance: Patient tolerated treatment well Patient left: in chair;with call bell/phone within reach;with family/visitor present  OT Visit Diagnosis: Muscle weakness (generalized) (M62.81)                Time: 3762-8315 OT Time Calculation (min): 20 min Charges:  OT General Charges $OT Visit: 1 Visit OT Evaluation $OT Eval Low Complexity: 1 Low  Gustavo Lah, OTR/L [place]  Office [phone]   Lenward Chancellor 08/15/2022, 12:26 PM

## 2022-08-15 NOTE — Progress Notes (Signed)
Speech Language Pathology Treatment: Dysphagia  Patient Details Name: Erin Ryan MRN: [id number] DOB: 02/12/1925 Today's Date: 08/15/2022 Time: 1205-1220 SLP Time Calculation (min) (ACUTE ONLY): 15 min  Assessment / Plan / Recommendation Clinical Impression  Pt seen for skilled SLP to address pt's swallow function.  Lunch tray arrived just prior to SLP session, thus functional meal observation took place. After assisting with set up - SLP observed pt consuming her meal including fruit, macaroni and cheese, roll, tea and Boost.  Pt able to feed herself well today, significantly improved compared to 2 days prior.  She reports her breathing and her swallowing is near baseline. Pt's niece in room and church visitors x2 arrived during session - speaking to pt's daughter.    Pt continues with ? Abrasion on torah of hard palate which is the same in appearance compared to Wednesday.  SLP palpated this area with pt denying discomfort.  Follow up as OP advised.   Pt continuing with throat clearing and cough after liquid consumption- as well as eructation noted. Question if pt may have laryngeal penetration before the swallow -suspect primary clinical indication of dysphagia are from known GERD.  Advised pt to consider consuming boost with her meal if decreases cough - or consider thin water due to pH neutrality of water.    Spoke to RN and advised to continue the general precautions including having pt take her medications with applesauce - whole.  Of note, pt is not aware of her occasional coughing/throat clearing during intake - thus suspect chronic issue of which she is has normalized. Her voice is strong today with much improved strength - thus anticipate much improved tolerance of intake.  As daughter was engaged with visitors at this time; will follow up next week *if pt present* to assure education completed. Thanks    HPI HPI: Patient is a 88 y.o. female with PMH: dementia, HTN, HLD, CKD, COPD, GERD,  hearing loss, anxiety, arthritis, compression fracture who lives at home with her daughter. She [facility] with confusion and concern for UTI as well as daughter reporting patient to have several weeks of progressively worsening dyspnea on exertion. In ED, patient was afebrile, on RA, BP 130/53. She started to have chest pain and was noted to be in a-fib. CT angio chest showed an acute PE with small thrombus burden in the subsegmental branch of the left lower lobe with RV strain.  It also showed small patchy densities in both mid and both lower lung fields suggesting atelectasis/pneumonia, minimal bilateral pleural effusion R>L. CT head did not reveal any acute findings.  Pt underwent prior evaluation at home with SLP via home health.      SLP Plan  Continue with current plan of care      Recommendations for follow up therapy are one component of a multi-disciplinary discharge planning process, led by the attending physician.  Recommendations may be updated based on patient status, additional functional criteria and insurance authorization.    Recommendations  Diet recommendations: Regular;Thin liquid Liquids provided via: Cup;No straw Medication Administration: Whole meds with puree Supervision: Staff to assist with self feeding Compensations: Slow rate;Small sips/bites Postural Changes and/or Swallow Maneuvers: Seated upright 90 degrees;Upright 30-60 min after meal                Oral Care Recommendations: Oral care BID Follow Up Recommendations: No SLP follow up (TBD) SLP Visit Diagnosis: Dysphagia, unspecified (R13.10) Plan: Continue with current  plan of care         Erin Lime, MS [place] Office [phone] Pager [phone]   Erin Ryan  08/15/2022, 12:31 PM

## 2022-08-15 NOTE — Progress Notes (Signed)
PROGRESS NOTE    Erin Ryan  WIO:[id number] DOB: 1925-09-09 DOA: 08/10/2022 PCP: Harlan Stains, MD    Chief Complaint  Patient presents with   Altered Mental Status    Brief Narrative:  Erin Ryan is a 88 y.o. female with PMH significant for dementia, HTN, HLD, CKD, COPD, GERD, hypothyroidism, hearing loss, anxiety, arthritis, compression fracture who lives at home with her daughter. 11/19, patient was brought to the ED at [facility] by her daughter with confusion, concerning for UTI.  Per daughter, patient had some urinary symptoms last week but was improved on its own.  For the last several weeks, patient is also having progressively worsening dyspnea on exertion.   In the ED, patient was afebrile, initially heart rate in 90s, blood pressure 130/53, breathing on room air  While in the ED, she started having chest pain.  She was noted to be in A-fib with RVR with heart rate up to 160 bpm.  She was given a dose of IV metoprolol 5 mg with conversion to normal sinus rhythm with heart rate in 70s Labs showed WBC count 12.5, hemoglobin 9.8, platelet 225, sodium 135, potassium 4, BUN/creatinine 51/1.22, glucose 139, BNP elevated 524, troponin normal, lactic acid level normal Respite virus panel negative for COVID, flu Urinalysis showed cloudy yellow urine with trace hemoglobin, large leukocytes, negative nitrate, few bacteria CT angio chest showed an acute PE with small thrombus burden in the subsegmental branch of the left lower lobe with RV strain.  It also showed small patchy densities in both mid and both lower lung fields suggesting atelectasis/pneumonia, minimal bilateral pleural effusion R>L. CT head with no acute findings. Patient was started on heparin drip.  Also empirically started on IV Rocephin. Admitted to [facility].  See below for details.   Patient's hospitalization has been complicated by persistent RVR likely precipitated by action, hypoxia as well as  hyperthyroidism.   Assessment & Plan:   Principal Problem:   Acute pulmonary embolism (HCC) Active Problems:   Essential hypertension   GERD   Chronic kidney disease (CKD), stage III (moderate) (HCC)   Adult hypothyroidism   Moderate dementia without behavioral disturbance, psychotic disturbance, mood disturbance, or anxiety (HCC)   Protein-calorie malnutrition, severe   Enterococcus UTI   Atrial fibrillation with RVR (HCC)   Generalized weakness   Urinary tract infection without hematuria  #1 acute pulmonary emboli -CT angiogram chest showed acute PE with small thrombus burden subsegmental branch of the left lower lobe with RV strain as well. -Lower extremity Dopplers done negative for DVT. -2D echo done with EF of 55 to 60%, NWMA, mild asymmetric LVH of the basal septal segment, grade 2 diastolic dysfunction, low normal right ventricular systolic function, right ventricular size mildly enlarged, mildly elevated pulmonary artery systolic pressure, mildly dilated right atrial size.  Mild MVR.  Mild mitral stenosis.  Moderate mitral annular calcification.  Mild thickening of aortic valve.  Moderate AVR. -Currently on heparin drip and will transition to Eliquis today as patient with clinical improvement and decreased O2 requirements.    2.  Acute respiratory failure with hypoxia -Multifactorial secondary to pneumonia, PE, A-fib with RVR, possible volume overload. -Patient with increasing O2 requirements on 6 L nasal cannula on 08/13/2022. -IV fluids discontinued. -Repeat chest x-ray (08/13/2022) with significant increased bilateral lung opacities noted concerning for worsening edema with mild bilateral pleural effusions. -Patient with decreased breath sounds noted on examination. -Volume overload likely secondary to A-fib with RVR. -Placed on Lasix 20  mg IV every 12 hours x 2 days with urine output of 2.560 L over the past 24 hours.. -Was on IV antibiotics and transition to oral  antibiotics to complete course of antibiotic treatment for pneumonia.   -On IV heparin transition to Eliquis.  -Was on amiodarone drip and transitioned to oral amiodarone per cardiology. -Supportive care.  3.  A-fib with RVR -Patient with no prior history of A-fib in the past. -Noted initially on admission in the ED, responded with IV metoprolol and placed on oral metoprolol. -Patient noted to have RVR recur and unable to place on Cardizem due to significant allergy, status post 1 dose IV digoxin and patient subsequently placed on amiodarone drip. -Cardiology consulted and patient transitioned from amiodarone drip to oral amiodarone on 08/13/2022, per cardiology. -Patient noted the morning of 08/14/2022, to transiently go into A-fib with RVR while transferring to commode and went back into normal sinus rhythm while cardiology was assessing patient. -On IV heparin will transition to Eliquis today as patient also noted to have PE.  -Cardiology recommending low-dose Eliquis 2.5 mg twice daily due to patient's advanced age, renal function rather than the typical apixaban PE dosing. -Pharmacy to dose Eliquis.  4.  Hyperthyroidism/history of hypothyroidism -Per family patient noted to have thyroid removal surgery 5 decades ago noted to be on Synthroid since then and Synthroid recently at 125 mcg's daily. -Unsure of last time Synthroid was adjusted. -TFTs obtained 08/11/2022 with a TSH significantly suppressed to < 0.01, free T4 elevated to 2.04. -Synthroid currently on hold. -Could likely discharge on a decreased dose of Synthroid with close outpatient follow-up with PCP.  5.  Mild bilateral pneumonia -CT chest done with small patchy densities in both mid and lower lung lobes suggestive of atelectasis/pneumonia, minimal bilateral pleural effusion R > L. -Currently afebrile. -Leukocytosis trended down.   -Was on IV Unasyn and has been transitioned to Augmentin to complete course of antibiotic  treatment.    6.  Enterococcus faecalis UTI -Was on IV Unasyn and has been transition to Augmentin to complete course of antibiotic treatment.     7.  Acute metabolic encephalopathy/vascular dementia/anxiety -Presenting complaints with confusion likely multifactorial secondary to UTI, pneumonia in the setting of dementia. -Mental status improving daily.  Daughter who is at bedside. -Continue antibiotics, Aricept, Cymbalta, Neurontin, Remeron.  8.  AKI on CKD stage IIIa. -Baseline creatinine < 1. -Creatinine initially noted to be slowly trending up as high as 1.36. -IV fluids discontinued creatinine down to 1.15. -Patient started on IV Lasix due to concerns for volume overload, renal function trended down to 1.12, slight bump in creatinine to 1.29 today.   -Further diuresis on hold.   9.  Acute on chronic diastolic CHF -Patient with increasing respiratory hypoxia and noted to be on 6 L nasal cannula oxygen 08/13/2022. -Acute CHF exacerbation likely triggered by A-fib with RVR. -Chest x-ray done, 08/13/2022 with significant increased bilateral lung opacities noted concerning for worsening edema with mild bilateral pleural effusion. -2D echo with EF of 55 to 23%, grade 2 diastolic dysfunction, mildly enlarged RV and mildly elevated PASP. -Patient noted to have been on Norvasc, metoprolol, Lasix prior to admission. -A-fib rate improved on amiodarone and patient noted to have converted to sinus rhythm yesterday, transiently converted back to A-fib with RVR early morning of 08/14/2022,while being transferred to commode per cardiology. -Patient subsequently went back into normal sinus rhythm. -BNP noted to be elevated at 523.6 on 08/10/2022. -Placed on Lasix 20 mg IV  every 12 hours with urine output of 2.560 L recorded in the past 24 hours and improved O2 requirements currently down to 2 L nasal cannula.   -Patient being followed by cardiology who recommended holding any further diuresis at this  time and on discharge able to go back to home regimen of 20 mg of furosemide every other day. -Strict I's and O's, daily weights. -Cardiology following.  10.  Hypertension -Patient noted to have been on amlodipine 2.5 mg daily, metoprolol 25 mg twice daily, Lasix 20 mg qod prior to admission. -Amlodipine and Lasix held since admission. -Patient currently on metoprolol and dose uptitrated per cardiology. -Status post IV Lasix every 12 hours for volume overload with urine output of 2.560 L over the past 24 hours.   -BP currently stable.   11.  Hyperlipidemia -Noted to have been on aspirin and statin prior to admission. -Continue statin.  12.  COPD -Patient changed from albuterol to Xopenex nebs due to A-fib with RVR. -Continue Incruse, Dulera.    13.  GERD -PPI.  14.  Impaired mobility -Continue PT/OT.-  15.  Severe protein calorie malnutrition -Nutritional supplementation.    16.  Oropharyngeal ulcer on hard palate -Per daughter was not there 3 months ago. -Outpatient follow-up with oral surgeon/dentist.    DVT prophylaxis: Heparin drip>>> Eliquis Code Status: Full Family Communication: Updated patient and daughter at bedside. Disposition: Transfer to telemetry.  Likely home in 2 to 3 days if continued clinical improvement.  Status is: Inpatient Remains inpatient appropriate because: Severity of illness.  Increased O2 requirements.   Consultants:  Cardiology: Dr. Marlou Porch 08/12/2022  Procedures:  CT angiogram chest 08/10/2022 CT head 08/10/2022 Chest x-ray 08/10/2022, 08/13/2022 2D echo 08/11/2022 Lower extremity Dopplers 08/11/2022  Antimicrobials:  IV Unasyn 08/11/2022>>>> 08/14/2022 IV Rocephin 08/10/2022>>> 08/11/2022 Augmentin 08/14/2022>>>>   Subjective: Patient laying in bed.  Feels shortness of breath is improved.  States she ate most of her breakfast this morning.  Denies any chest pain.  No abdominal pain.  Currently on 2 L nasal cannula with sats of  96 to 98%.  Daughter at bedside.  In sinus rhythm.    Objective: Vitals:   08/15/22 0804 08/15/22 0805 08/15/22 1237 08/15/22 1455  BP:    (!) 133/49  Pulse:    68  Resp:    17  Temp:   97.6 F (36.4 C) (!) 97.5 F (36.4 C)  TempSrc:   Axillary Axillary  SpO2: 98% 98%  92%  Weight:      Height:        Intake/Output Summary (Last 24 hours) at 08/15/2022 1543 Last data filed at 08/15/2022 1300 Gross per 24 hour  Intake 580.58 ml  Output 1900 ml  Net -1319.42 ml   Filed Weights   08/10/22 1711 08/13/22 0747 08/14/22 0500  Weight: 57.8 kg 66.6 kg 58.4 kg    Examination:  General exam: NAD.  Pharyngeal ulcer/lesion also noted on hard palate Respiratory system: Decreased breath sounds in the bases.  Improved air movement.  No wheezing.  No significant crackles.  Fair air movement.  Speaking in full sentences.   Cardiovascular system: RRR no murmurs rubs or gallops.  No JVD.  No lower extremity edema.  Gastrointestinal system: Abdomen is soft, nontender, nondistended, positive bowel sounds.  No rebound.  No guarding.  Central nervous system: Alert and oriented. No focal neurological deficits. Extremities: Symmetric 5 x 5 power. Skin: No rashes, lesions or ulcers Psychiatry: Judgement and insight appear fair.  Hard  of hearing.  Mood & affect appropriate.     Data Reviewed: I have personally reviewed following labs and imaging studies  CBC: Recent Labs  Lab 08/10/22 1103 08/11/22 0344 08/12/22 0324 08/13/22 0318 08/14/22 0319 08/15/22 0307  WBC 12.5* 16.0* 14.2* 11.0* 7.8 8.4  NEUTROABS 9.8*  --  11.1* 7.9* 5.3  --   HGB 11.8* 9.9* 10.8* 10.9* 10.7* 11.0*  HCT 37.0 31.3* 34.8* 35.6* 34.2* 35.0*  MCV 89.2 91.5 91.6 93.4 90.7 89.7  PLT 225 215 227 252 269 809    Basic Metabolic Panel: Recent Labs  Lab 08/11/22 0344 08/12/22 0324 08/13/22 0318 08/13/22 0810 08/14/22 0319 08/15/22 0307  NA 138 135 139  --  137 139  K 4.1 3.8 4.3  --  4.2 4.2  CL 107 106 109   --  105 104  CO2 22 20* 22  --  24 25  GLUCOSE 122* 120* 112*  --  109* 102*  BUN 45* 48* 46*  --  42* 46*  CREATININE 1.28* 1.36* 1.15*  --  1.12* 1.29*  CALCIUM 7.6* 7.4* 7.4*  --  7.7* 8.4*  MG  --   --   --  1.9  --  2.0    GFR: Estimated Creatinine Clearance: 20.6 mL/min (A) (by C-G formula based on SCr of 1.29 mg/dL (H)).  Liver Function Tests: Recent Labs  Lab 08/10/22 1103  AST 28  ALT 18  ALKPHOS 64  BILITOT 0.4  PROT 6.5  ALBUMIN 2.8*    CBG: No results for input(s): "GLUCAP" in the last 168 hours.   Recent Results (from the past 240 hour(s))  Resp Panel by RT-PCR (Flu A&B, Covid)     Status: None   Collection Time: 08/10/22 11:53 AM   Specimen: Nasal Swab  Result Value Ref Range Status   SARS Coronavirus 2 by RT PCR NEGATIVE NEGATIVE Final    Comment: (NOTE) SARS-CoV-2 target nucleic acids are NOT DETECTED.  The SARS-CoV-2 RNA is generally detectable in upper respiratory specimens during the acute phase of infection. The lowest concentration of SARS-CoV-2 viral copies this assay can detect is 138 copies/mL. A negative result does not preclude SARS-Cov-2 infection and should not be used as the sole basis for treatment or other patient management decisions. A negative result may occur with  improper specimen collection/handling, submission of specimen other than nasopharyngeal swab, presence of viral mutation(s) within the areas targeted by this assay, and inadequate number of viral copies(<138 copies/mL). A negative result must be combined with clinical observations, patient history, and epidemiological information. The expected result is Negative.  Fact Sheet for Patients:  EntrepreneurPulse.com.au  Fact Sheet for Healthcare Providers:  IncredibleEmployment.be  This test is no t yet approved or cleared by the [place] FDA and  has been authorized for detection and/or diagnosis of SARS-CoV-2 by FDA under an  Emergency Use Authorization (EUA). This EUA will remain  in effect (meaning this test can be used) for the duration of the COVID-19 declaration under Section 564(b)(1) of the Act, 21 U.S.C.section 360bbb-3(b)(1), unless the authorization is terminated  or revoked sooner.       Influenza A by PCR NEGATIVE NEGATIVE Final   Influenza B by PCR NEGATIVE NEGATIVE Final    Comment: (NOTE) The Xpert Xpress SARS-CoV-2/FLU/RSV plus assay is intended as an aid in the diagnosis of influenza from Nasopharyngeal swab specimens and should not be used as a sole basis for treatment. Nasal washings and aspirates are unacceptable for Xpert  Xpress SARS-CoV-2/FLU/RSV testing.  Fact Sheet for Patients: EntrepreneurPulse.com.au  Fact Sheet for Healthcare Providers: IncredibleEmployment.be  This test is not yet approved or cleared by the [place] FDA and has been authorized for detection and/or diagnosis of SARS-CoV-2 by FDA under an Emergency Use Authorization (EUA). This EUA will remain in effect (meaning this test can be used) for the duration of the COVID-19 declaration under Section 564(b)(1) of the Act, 21 U.S.C. section 360bbb-3(b)(1), unless the authorization is terminated or revoked.  Performed at [facility], [address], [place], [place] [address]   Urine Culture     Status: Abnormal   Collection Time: 08/10/22 11:54 AM   Specimen: Urine, Clean Catch  Result Value Ref Range Status   Specimen Description   Final    URINE, CLEAN CATCH Performed at [facility], [address] [place], [place] [address]    Special Requests   Final    NONE Performed at [facility], [address] [place], [place] [address]    Culture >=100,000 COLONIES/mL ENTEROCOCCUS FAECALIS (A)  Final   Report Status 08/12/2022 FINAL  Final   Organism ID, Bacteria ENTEROCOCCUS FAECALIS (A)  Final      Susceptibility   Enterococcus  faecalis - MIC*    AMPICILLIN <=2 SENSITIVE Sensitive     NITROFURANTOIN <=16 SENSITIVE Sensitive     VANCOMYCIN 1 SENSITIVE Sensitive     * >=100,000 COLONIES/mL ENTEROCOCCUS FAECALIS  MRSA Next Gen by PCR, Nasal     Status: None   Collection Time: 08/10/22  5:52 PM   Specimen: Nasal Mucosa; Nasal Swab  Result Value Ref Range Status   MRSA by PCR Next Gen NOT DETECTED NOT DETECTED Final    Comment: (NOTE) The GeneXpert MRSA Assay (FDA approved for NASAL specimens only), is one component of a comprehensive MRSA colonization surveillance program. It is not intended to diagnose MRSA infection nor to guide or monitor treatment for MRSA infections. Test performance is not FDA approved in patients less than 64 years old. Performed at [facility], [place] [address], [place],  [address]          Radiology Studies: No results found.      Scheduled Meds:  amiodarone  200 mg Oral BID   amoxicillin-clavulanate  500 mg Oral Q12H   apixaban  2.5 mg Oral BID   atorvastatin  40 mg Oral QHS   Chlorhexidine Gluconate Cloth  6 each Topical Daily   donepezil  10 mg Oral QHS   DULoxetine  30 mg Oral Daily   gabapentin  100 mg Oral QHS   lactose free nutrition  237 mL Oral TID BM   metoprolol tartrate  75 mg Oral BID   mirtazapine  15 mg Oral QHS   mometasone-formoterol  2 puff Inhalation BID   pantoprazole  40 mg Oral Daily   umeclidinium bromide  1 puff Inhalation Daily   Continuous Infusions:  sodium chloride Stopped (08/14/22 0601)     LOS: 5 days    Time spent: 40 minutes    Irine Seal, MD [facility]   To contact the attending provider between 7A-7P or the covering provider during after hours 7P-7A, please log into the web site [email or web address] and access using universal [place] password for that web site. If you do not have the password, please call the hospital operator.  08/15/2022, 3:43 PM

## 2022-08-16 DIAGNOSIS — R531 Weakness: Secondary | ICD-10-CM | POA: Diagnosis not present

## 2022-08-16 DIAGNOSIS — I2699 Other pulmonary embolism without acute cor pulmonale: Secondary | ICD-10-CM | POA: Diagnosis not present

## 2022-08-16 DIAGNOSIS — N390 Urinary tract infection, site not specified: Secondary | ICD-10-CM | POA: Diagnosis not present

## 2022-08-16 DIAGNOSIS — I4891 Unspecified atrial fibrillation: Secondary | ICD-10-CM | POA: Diagnosis not present

## 2022-08-16 LAB — BASIC METABOLIC PANEL
Anion gap: 10 (ref 5–15)
BUN: 46 mg/dL — ABNORMAL HIGH (ref 8–23)
CO2: 25 mmol/L (ref 22–32)
Calcium: 9 mg/dL (ref 8.9–10.3)
Chloride: 103 mmol/L (ref 98–111)
Creatinine, Ser: 1.21 mg/dL — ABNORMAL HIGH (ref 0.44–1.00)
GFR, Estimated: 41 mL/min — ABNORMAL LOW (ref >60)
Glucose, Bld: 94 mg/dL (ref 70–99)
Potassium: 5 mmol/L (ref 3.5–5.1)
Sodium: 138 mmol/L (ref 135–145)

## 2022-08-16 LAB — CBC
HCT: 36.7 % (ref 36.0–46.0)
Hemoglobin: 11.4 g/dL — ABNORMAL LOW (ref 12.0–15.0)
MCH: 28.1 pg (ref 26.0–34.0)
MCHC: 31.1 g/dL (ref 30.0–36.0)
MCV: 90.6 fL (ref 80.0–100.0)
Platelets: 320 10*3/uL (ref 150–400)
RBC: 4.05 MIL/uL (ref 3.87–5.11)
RDW: 15 % (ref 11.5–15.5)
WBC: 8.6 10*3/uL (ref 4.0–10.5)
nRBC: 0 % (ref 0.0–0.2)

## 2022-08-16 LAB — MAGNESIUM: Magnesium: 2.2 mg/dL (ref 1.7–2.4)

## 2022-08-16 LAB — HEPARIN LEVEL (UNFRACTIONATED): Heparin Unfractionated: >1.1 IU/mL — ABNORMAL HIGH (ref 0.30–0.70)

## 2022-08-16 MED ORDER — SACCHAROMYCES BOULARDII 250 MG PO CAPS
250.0000 mg | ORAL_CAPSULE | Freq: Two times a day (BID) | ORAL | Status: DC
  Administered 2022-08-16 – 2022-08-20 (×9): 250 mg via ORAL
  Filled 2022-08-16 (×9): qty 1

## 2022-08-16 NOTE — Progress Notes (Signed)
TOC will follow up with patient with home health needs for DC.

## 2022-08-16 NOTE — Progress Notes (Signed)
CSW has sent the patient to [facility] [place] also [facility] no response please follow up with [facility]. Also provider has asked about SNF placement even LTC support as the daughter is not sure how she will care for her. This CSW also spoke to the daughter about private care which the daughter said that could be an option.

## 2022-08-16 NOTE — Discharge Instructions (Signed)

## 2022-08-16 NOTE — Social Work (Signed)
CSW spoke to the daughter. The daughter would like to use [facility] and try to do some private pay. Patients daughter is aware that [facility] is only a few days a week unless she private pays [facility] will continue to follow.

## 2022-08-16 NOTE — Progress Notes (Signed)
PROGRESS NOTE    Erin Ryan  EXH:[id number] DOB: 1924-10-25 DOA: 08/10/2022 PCP: Harlan Stains, MD    Chief Complaint  Patient presents with   Altered Mental Status    Brief Narrative:  Erin Ryan is a 88 y.o. female with PMH significant for dementia, HTN, HLD, CKD, COPD, GERD, hypothyroidism, hearing loss, anxiety, arthritis, compression fracture who lives at home with her daughter. 11/19, patient was brought to the ED at [facility] by her daughter with confusion, concerning for UTI.  Per daughter, patient had some urinary symptoms last week but was improved on its own.  For the last several weeks, patient is also having progressively worsening dyspnea on exertion.   In the ED, patient was afebrile, initially heart rate in 90s, blood pressure 130/53, breathing on room air  While in the ED, she started having chest pain.  She was noted to be in A-fib with RVR with heart rate up to 160 bpm.  She was given a dose of IV metoprolol 5 mg with conversion to normal sinus rhythm with heart rate in 70s Labs showed WBC count 12.5, hemoglobin 9.8, platelet 225, sodium 135, potassium 4, BUN/creatinine 51/1.22, glucose 139, BNP elevated 524, troponin normal, lactic acid level normal Respite virus panel negative for COVID, flu Urinalysis showed cloudy yellow urine with trace hemoglobin, large leukocytes, negative nitrate, few bacteria CT angio chest showed an acute PE with small thrombus burden in the subsegmental branch of the left lower lobe with RV strain.  It also showed small patchy densities in both mid and both lower lung fields suggesting atelectasis/pneumonia, minimal bilateral pleural effusion R>L. CT head with no acute findings. Patient was started on heparin drip.  Also empirically started on IV Rocephin. Admitted to [facility].  See below for details.   Patient's hospitalization has been complicated by persistent RVR likely precipitated by action, hypoxia as well as  hyperthyroidism.   Assessment & Plan:   Principal Problem:   Acute pulmonary embolism (HCC) Active Problems:   Essential hypertension   GERD   Chronic kidney disease (CKD), stage III (moderate) (HCC)   Adult hypothyroidism   Moderate dementia without behavioral disturbance, psychotic disturbance, mood disturbance, or anxiety (HCC)   Protein-calorie malnutrition, severe   Enterococcus UTI   Atrial fibrillation with RVR (HCC)   Generalized weakness   Urinary tract infection without hematuria  #1 acute pulmonary emboli -CT angiogram chest showed acute PE with small thrombus burden subsegmental branch of the left lower lobe with RV strain as well. -Lower extremity Dopplers done negative for DVT. -2D echo done with EF of 55 to 60%, NWMA, mild asymmetric LVH of the basal septal segment, grade 2 diastolic dysfunction, low normal right ventricular systolic function, right ventricular size mildly enlarged, mildly elevated pulmonary artery systolic pressure, mildly dilated right atrial size.  Mild MVR.  Mild mitral stenosis.  Moderate mitral annular calcification.  Mild thickening of aortic valve.  Moderate AVR. -Was on heparin drip and transition to Eliquis as patient has improved clinically with decreased O2 requirements.   -Outpatient follow-up.   2.  Acute respiratory failure with hypoxia -Multifactorial secondary to pneumonia, PE, A-fib with RVR, possible volume overload. -Patient with increasing O2 requirements on 6 L nasal cannula on 08/13/2022. -IV fluids discontinued. -Repeat chest x-ray (08/13/2022) with significant increased bilateral lung opacities noted concerning for worsening edema with mild bilateral pleural effusions. -Patient with decreased breath sounds noted on examination which have improved.. -Volume overload likely secondary to A-fib with RVR. -  Hypoxia improved currently sats of 97% on room air. -Placed on Lasix 20 mg IV every 12 hours x 2 days with good urine output.   -IV Lasix have been discontinued. -Was on IV antibiotics and transitioned to oral antibiotics to complete course of antibiotic treatment for pneumonia.   -On IV heparin transitioned to Eliquis.  -Was on amiodarone drip and transitioned to oral amiodarone per cardiology. -Supportive care.  3.  A-fib with RVR -Patient with no prior history of A-fib in the past. -Noted initially on admission in the ED, responded with IV metoprolol and placed on oral metoprolol. -Patient noted to have RVR recur and unable to place on Cardizem due to significant allergy, status post 1 dose IV digoxin and patient subsequently placed on amiodarone drip. -Cardiology consulted and patient transitioned from amiodarone drip to oral amiodarone on 08/13/2022, per cardiology. -Patient noted the morning of 08/14/2022, to transiently go into A-fib with RVR while transferring to commode and went back into normal sinus rhythm while cardiology was assessing patient. -On IV heparin will transition to Eliquis today as patient also noted to have PE.  -Cardiology recommending low-dose Eliquis 2.5 mg twice daily due to patient's advanced age, renal function rather than the typical apixaban PE dosing. -Patient transition to Eliquis per pharmacy.   4.  Hyperthyroidism/history of hypothyroidism -Per family patient noted to have thyroid removal surgery 5 decades ago noted to be on Synthroid since then and Synthroid recently at 125 mcg's daily. -Unsure of last time Synthroid was adjusted. -TFTs obtained 08/11/2022 with a TSH significantly suppressed to < 0.01, free T4 elevated to 2.04. -Synthroid currently on hold. -Could likely discharge on a decreased dose of Synthroid with close outpatient follow-up with PCP.  5.  Mild bilateral pneumonia -CT chest done with small patchy densities in both mid and lower lung lobes suggestive of atelectasis/pneumonia, minimal bilateral pleural effusion R > L. -Currently afebrile. -Leukocytosis  trended down.   -Was on IV Unasyn and has been transitioned to Augmentin to complete course of antibiotic treatment.    6.  Enterococcus faecalis UTI -Was on IV Unasyn and has been transitioned to Augmentin to complete course of antibiotic treatment.     7.  Acute metabolic encephalopathy/vascular dementia/anxiety -Presenting complaints with confusion likely multifactorial secondary to UTI, pneumonia in the setting of dementia. -Mental status improving daily.  Daughter who is at bedside. -Continue antibiotics, Aricept, Cymbalta, Neurontin, Remeron.  8.  AKI on CKD stage IIIa. -Baseline creatinine < 1. -Creatinine initially noted to be slowly trending up as high as 1.36. -IV fluids discontinued creatinine down to 1.15. -Patient started on IV Lasix due to concerns for volume overload, renal function trended down to 1.12, and seems to be stabilizing at 1.21.    9.  Acute on chronic diastolic CHF -Patient with increasing respiratory hypoxia and noted to be on 6 L nasal cannula oxygen 08/13/2022. -Acute CHF exacerbation likely triggered by A-fib with RVR. -Chest x-ray done, 08/13/2022 with significant increased bilateral lung opacities noted concerning for worsening edema with mild bilateral pleural effusion. -2D echo with EF of 55 to 88%, grade 2 diastolic dysfunction, mildly enlarged RV and mildly elevated PASP. -Patient noted to have been on Norvasc, metoprolol, Lasix prior to admission. -A-fib rate improved on amiodarone and patient noted to have converted to sinus rhythm yesterday, transiently converted back to A-fib with RVR early morning of 08/14/2022,while being transferred to commode per cardiology. -Patient subsequently went back into normal sinus rhythm. -BNP noted to be elevated  at 523.6 on 08/10/2022. -Placed on Lasix 20 mg IV every 12 hours with good urine output.  -Currently on room air. -Patient being followed by cardiology who recommended holding any further diuresis at this  time and on discharge able to go back to home regimen of 20 mg of furosemide every other day. -Strict I's and O's, daily weights. -Cardiology following.  10.  Hypertension -Patient noted to have been on amlodipine 2.5 mg daily, metoprolol 25 mg twice daily, Lasix 20 mg qod prior to admission. -Amlodipine and Lasix held since admission. -Patient currently on metoprolol and dose uptitrated per cardiology. -Status post IV Lasix every 12 hours for volume overload with good urine output.   -IV Lasix discontinued.   -BP stable.    11.  Hyperlipidemia -Noted to have been on aspirin and statin prior to admission. -Continue statin.  12.  COPD -Patient changed from albuterol to Xopenex nebs due to A-fib with RVR. -Continue Incruse, Dulera.    13.  GERD -Continue PPI.  14.  Impaired mobility -Continue PT/OT.-  15.  Severe protein calorie malnutrition -Nutritional supplementation.    16.  Oropharyngeal ulcer on hard palate -Per daughter was not there 3 months ago. -Outpatient follow-up with oral surgeon/dentist.  17.  Urinary retention -Patient noted earlier on the hospitalization to have urinary retention, Foley catheter placed. -Discontinue Foley catheter, voiding trial.    DVT prophylaxis: Heparin drip>>> Eliquis Code Status: Full Family Communication: Updated patient and daughter at bedside. Disposition: Likely home in the next 1 to 2 days.   Status is: Inpatient Remains inpatient appropriate because: Severity of illness.  Increased O2 requirements.   Consultants:  Cardiology: Dr. Marlou Porch 08/12/2022  Procedures:  CT angiogram chest 08/10/2022 CT head 08/10/2022 Chest x-ray 08/10/2022, 08/13/2022 2D echo 08/11/2022 Lower extremity Dopplers 08/11/2022  Antimicrobials:  IV Unasyn 08/11/2022>>>> 08/14/2022 IV Rocephin 08/10/2022>>> 08/11/2022 Augmentin 08/14/2022>>>> 08/18/2022   Subjective: Laying in bed.  Overall states she feels better.  States shortness of  breath is improved.  Denies any chest pain.  Currently on room air with sats of 97%.  Daughter at bedside.    Objective: Vitals:   08/16/22 0500 08/16/22 0945 08/16/22 1314 08/16/22 1735  BP:  (!) 119/57 (!) 131/54   Pulse:  64 73   Resp:  18    Temp:  97.6 F (36.4 C) 97.9 F (36.6 C)   TempSrc:  Axillary Oral   SpO2:  96% 97% 97%  Weight: 58 kg     Height:        Intake/Output Summary (Last 24 hours) at 08/16/2022 1806 Last data filed at 08/16/2022 1314 Gross per 24 hour  Intake 595 ml  Output 1175 ml  Net -580 ml    Filed Weights   08/13/22 0747 08/14/22 0500 08/16/22 0500  Weight: 66.6 kg 58.4 kg 58 kg    Examination:  General exam: NAD.  Pharyngeal ulcer/lesion also noted on hard palate Respiratory system: Decreased breath sounds in the bases.  No wheezing, no significant crackles.  Fair air movement.  Speaking in full sentences.   Cardiovascular system: Regular rate rhythm no murmurs rubs or gallops.  No JVD.  No lower extremity edema.  Gastrointestinal system: Abdomen is soft, nontender, nondistended, positive bowel sounds.  No rebound.  No guarding.  Central nervous system: Alert and oriented. No focal neurological deficits. Extremities: Symmetric 5 x 5 power. Skin: No rashes, lesions or ulcers Psychiatry: Judgement and insight appear fair.  Hard of hearing.  Mood & affect  appropriate.     Data Reviewed: I have personally reviewed following labs and imaging studies  CBC: Recent Labs  Lab 08/10/22 1103 08/11/22 0344 08/12/22 0324 08/13/22 0318 08/14/22 0319 08/15/22 0307 08/16/22 0551  WBC 12.5*   < > 14.2* 11.0* 7.8 8.4 8.6  NEUTROABS 9.8*  --  11.1* 7.9* 5.3  --   --   HGB 11.8*   < > 10.8* 10.9* 10.7* 11.0* 11.4*  HCT 37.0   < > 34.8* 35.6* 34.2* 35.0* 36.7  MCV 89.2   < > 91.6 93.4 90.7 89.7 90.6  PLT 225   < > 227 252 269 301 320   < > = values in this interval not displayed.     Basic Metabolic Panel: Recent Labs  Lab 08/12/22 0324  08/13/22 0318 08/13/22 0810 08/14/22 0319 08/15/22 0307 08/16/22 0551  NA 135 139  --  137 139 138  K 3.8 4.3  --  4.2 4.2 5.0  CL 106 109  --  105 104 103  CO2 20* 22  --  '24 25 25  '$ GLUCOSE 120* 112*  --  109* 102* 94  BUN 48* 46*  --  42* 46* 46*  CREATININE 1.36* 1.15*  --  1.12* 1.29* 1.21*  CALCIUM 7.4* 7.4*  --  7.7* 8.4* 9.0  MG  --   --  1.9  --  2.0 2.2     GFR: Estimated Creatinine Clearance: 22 mL/min (A) (by C-G formula based on SCr of 1.21 mg/dL (H)).  Liver Function Tests: Recent Labs  Lab 08/10/22 1103  AST 28  ALT 18  ALKPHOS 64  BILITOT 0.4  PROT 6.5  ALBUMIN 2.8*     CBG: No results for input(s): "GLUCAP" in the last 168 hours.   Recent Results (from the past 240 hour(s))  Resp Panel by RT-PCR (Flu A&B, Covid)     Status: None   Collection Time: 08/10/22 11:53 AM   Specimen: Nasal Swab  Result Value Ref Range Status   SARS Coronavirus 2 by RT PCR NEGATIVE NEGATIVE Final    Comment: (NOTE) SARS-CoV-2 target nucleic acids are NOT DETECTED.  The SARS-CoV-2 RNA is generally detectable in upper respiratory specimens during the acute phase of infection. The lowest concentration of SARS-CoV-2 viral copies this assay can detect is 138 copies/mL. A negative result does not preclude SARS-Cov-2 infection and should not be used as the sole basis for treatment or other patient management decisions. A negative result may occur with  improper specimen collection/handling, submission of specimen other than nasopharyngeal swab, presence of viral mutation(s) within the areas targeted by this assay, and inadequate number of viral copies(<138 copies/mL). A negative result must be combined with clinical observations, patient history, and epidemiological information. The expected result is Negative.  Fact Sheet for Patients:  EntrepreneurPulse.com.au  Fact Sheet for Healthcare Providers:  IncredibleEmployment.be  This  test is no t yet approved or cleared by the [place] FDA and  has been authorized for detection and/or diagnosis of SARS-CoV-2 by FDA under an Emergency Use Authorization (EUA). This EUA will remain  in effect (meaning this test can be used) for the duration of the COVID-19 declaration under Section 564(b)(1) of the Act, 21 U.S.C.section 360bbb-3(b)(1), unless the authorization is terminated  or revoked sooner.       Influenza A by PCR NEGATIVE NEGATIVE Final   Influenza B by PCR NEGATIVE NEGATIVE Final    Comment: (NOTE) The Xpert Xpress SARS-CoV-2/FLU/RSV plus assay is intended  as an aid in the diagnosis of influenza from Nasopharyngeal swab specimens and should not be used as a sole basis for treatment. Nasal washings and aspirates are unacceptable for Xpert Xpress SARS-CoV-2/FLU/RSV testing.  Fact Sheet for Patients: EntrepreneurPulse.com.au  Fact Sheet for Healthcare Providers: IncredibleEmployment.be  This test is not yet approved or cleared by the [place] FDA and has been authorized for detection and/or diagnosis of SARS-CoV-2 by FDA under an Emergency Use Authorization (EUA). This EUA will remain in effect (meaning this test can be used) for the duration of the COVID-19 declaration under Section 564(b)(1) of the Act, 21 U.S.C. section 360bbb-3(b)(1), unless the authorization is terminated or revoked.  Performed at [facility], [address], [place], [place] [address]   Urine Culture     Status: Abnormal   Collection Time: 08/10/22 11:54 AM   Specimen: Urine, Clean Catch  Result Value Ref Range Status   Specimen Description   Final    URINE, CLEAN CATCH Performed at [facility], [address] [place], [place] [address]    Special Requests   Final    NONE Performed at [facility], [address] [place], [place] [address]    Culture >=100,000 COLONIES/mL ENTEROCOCCUS FAECALIS  (A)  Final   Report Status 08/12/2022 FINAL  Final   Organism ID, Bacteria ENTEROCOCCUS FAECALIS (A)  Final      Susceptibility   Enterococcus faecalis - MIC*    AMPICILLIN <=2 SENSITIVE Sensitive     NITROFURANTOIN <=16 SENSITIVE Sensitive     VANCOMYCIN 1 SENSITIVE Sensitive     * >=100,000 COLONIES/mL ENTEROCOCCUS FAECALIS  MRSA Next Gen by PCR, Nasal     Status: None   Collection Time: 08/10/22  5:52 PM   Specimen: Nasal Mucosa; Nasal Swab  Result Value Ref Range Status   MRSA by PCR Next Gen NOT DETECTED NOT DETECTED Final    Comment: (NOTE) The GeneXpert MRSA Assay (FDA approved for NASAL specimens only), is one component of a comprehensive MRSA colonization surveillance program. It is not intended to diagnose MRSA infection nor to guide or monitor treatment for MRSA infections. Test performance is not FDA approved in patients less than 35 years old. Performed at [facility], [place] [address], [place], [place] [address]          Radiology Studies: No results found.      Scheduled Meds:  amiodarone  200 mg Oral BID   amoxicillin-clavulanate  500 mg Oral Q12H   apixaban  2.5 mg Oral BID   atorvastatin  40 mg Oral QHS   Chlorhexidine Gluconate Cloth  6 each Topical Daily   donepezil  10 mg Oral QHS   DULoxetine  30 mg Oral Daily   gabapentin  100 mg Oral QHS   lactose free nutrition  237 mL Oral TID BM   metoprolol tartrate  75 mg Oral BID   mirtazapine  15 mg Oral QHS   mometasone-formoterol  2 puff Inhalation BID   pantoprazole  40 mg Oral Daily   saccharomyces boulardii  250 mg Oral BID   umeclidinium bromide  1 puff Inhalation Daily   Continuous Infusions:  sodium chloride Stopped (08/14/22 0601)     LOS: 6 days    Time spent: 40 minutes    Irine Seal, MD [facility]   To contact the attending provider between 7A-7P or the covering provider during after hours 7P-7A, please log into  the web site [email or web address] and  access using universal [place] password for that web site. If you do not have the password, please call the hospital operator.  08/16/2022, 6:06 PM

## 2022-08-16 NOTE — Progress Notes (Signed)
Physical Therapy Treatment Patient Details Name: Erin Ryan MRN: [id number] DOB: 1924-11-14 Today's Date: 08/16/2022   History of Present Illness Pt admitted from home with AMS and weakness and diagnosed with acute PE, afib with RVR, bil PNA, UTI, AKI on CKD3a, and acute metabolic encephalopathy.  Pt with hx of dementia, htn, CKD and COPD    PT Comments    Pt was in bed, and dtr present. Work with pt just needed cues and time to help herself with minimal assistance. Dtr was concerned she would have to pull on her and lift her , but I explained and demonstrated that pt could move with minimal help and cues for safety. Pt is still very weak and fatigued very quickly with a short walk in the hallway and will need assistance initially 24/7 in the home.  Will continue to follow while in acute care and also will have mobility team work with pt as well.     Recommendations for follow up therapy are one component of a multi-disciplinary discharge planning process, led by the attending physician.  Recommendations may be updated based on patient status, additional functional criteria and insurance authorization.  Follow Up Recommendations  Home health PT     Assistance Recommended at Discharge Intermittent Supervision/Assistance  Patient can return home with the following A little help with walking and/or transfers;A little help with bathing/dressing/bathroom;Assistance with cooking/housework;Assist for transportation;Help with stairs or ramp for entrance   Equipment Recommendations  None recommended by PT    Recommendations for Other Services       Precautions / Restrictions Precautions Precautions: Fall Precaution Comments: monitor O2 sats Restrictions Weight Bearing Restrictions: No     Mobility  Bed Mobility Overal bed mobility: Needs Assistance Bed Mobility: Supine to Sit     Supine to sit: Min guard, HOB elevated     General bed mobility comments: Min guard for supine to  sit. Bottom of bed was inflated.    Transfers Overall transfer level: Needs assistance Equipment used: Rolling walker (2 wheels) Transfers: Sit to/from Stand Sit to Stand: Min guard           General transfer comment: Min guard to stand and walk around the bed with walker. No overt loss of balance. Needed assistance to manage lines. o2 sat WLF on RA    Ambulation/Gait Ambulation/Gait assistance: Min guard Gait Distance (Feet): 80 Feet Assistive device: Rolling walker (2 wheels) Gait Pattern/deviations: Step-to pattern       General Gait Details: towards the end of the walk pt becam very fatigued and had to bend forward supported in her forearms on the walker.   Stairs             Wheelchair Mobility    Modified Rankin (Stroke Patients Only)       Balance Overall balance assessment: Mild deficits observed, not formally tested Sitting-balance support: No upper extremity supported, Feet supported Sitting balance-Leahy Scale: Good     Standing balance support: Bilateral upper extremity supported, During functional activity Standing balance-Leahy Scale: Fair                              Cognition Arousal/Alertness: Awake/alert Behavior During Therapy: WFL for tasks assessed/performed Overall Cognitive Status: History of cognitive impairments - at baseline  General Comments: Pleasant. Able to follow commands. HOH makes assessing cognition difficult        Exercises      General Comments        Pertinent Vitals/Pain Pain Assessment Pain Assessment: No/denies pain    Home Living                          Prior Function            PT Goals (current goals can now be found in the care plan section) Acute Rehab PT Goals Patient Stated Goal: HOME PT Goal Formulation: With patient Time For Goal Achievement: 08/28/22 Potential to Achieve Goals: Good Progress towards PT goals:  Progressing toward goals    Frequency    Min 3X/week      PT Plan Current plan remains appropriate    Co-evaluation              AM-PAC PT "6 Clicks" Mobility   Outcome Measure  Help needed turning from your back to your side while in a flat bed without using bedrails?: A Little Help needed moving from lying on your back to sitting on the side of a flat bed without using bedrails?: A Little Help needed moving to and from a bed to a chair (including a wheelchair)?: A Little Help needed standing up from a chair using your arms (e.g., wheelchair or bedside chair)?: A Little Help needed to walk in hospital room?: A Little Help needed climbing 3-5 steps with a railing? : A Little 6 Click Score: 18    End of Session Equipment Utilized During Treatment: Gait belt Activity Tolerance: Patient tolerated treatment well;Patient limited by fatigue Patient left: in chair;with call bell/phone within reach;with chair alarm set;with family/visitor present Nurse Communication: Mobility status PT Visit Diagnosis: Difficulty in walking, not elsewhere classified (R26.2);Unsteadiness on feet (R26.81);Muscle weakness (generalized) (M62.81)     Time: 0258-5277 PT Time Calculation (min) (ACUTE ONLY): 45 min  Charges:  $Gait Training: 23-37 mins                     Gatha Mayer, PT, MPT Acute Rehabilitation Services Office: [phone] If a weekend: [facility] w/e pager [phone] 08/16/2022    Clide Dales 08/16/2022, 5:40 PM

## 2022-08-16 NOTE — Progress Notes (Signed)
Erin Ryan has accepted this Pt, please follow up with the daughter to let her know. Also please see CSW previous notes about home care. TOC will continue to follow.

## 2022-08-17 DIAGNOSIS — R531 Weakness: Secondary | ICD-10-CM | POA: Diagnosis not present

## 2022-08-17 DIAGNOSIS — I4891 Unspecified atrial fibrillation: Secondary | ICD-10-CM | POA: Diagnosis not present

## 2022-08-17 DIAGNOSIS — I2699 Other pulmonary embolism without acute cor pulmonale: Secondary | ICD-10-CM | POA: Diagnosis not present

## 2022-08-17 DIAGNOSIS — N390 Urinary tract infection, site not specified: Secondary | ICD-10-CM | POA: Diagnosis not present

## 2022-08-17 LAB — BASIC METABOLIC PANEL
Anion gap: 10 (ref 5–15)
BUN: 47 mg/dL — ABNORMAL HIGH (ref 8–23)
CO2: 25 mmol/L (ref 22–32)
Calcium: 9 mg/dL (ref 8.9–10.3)
Chloride: 104 mmol/L (ref 98–111)
Creatinine, Ser: 1.22 mg/dL — ABNORMAL HIGH (ref 0.44–1.00)
GFR, Estimated: 41 mL/min — ABNORMAL LOW (ref >60)
Glucose, Bld: 98 mg/dL (ref 70–99)
Potassium: 5 mmol/L (ref 3.5–5.1)
Sodium: 139 mmol/L (ref 135–145)

## 2022-08-17 NOTE — Progress Notes (Signed)
Mobility Specialist - Progress Note   08/17/22 1409  Mobility  Activity Ambulated with assistance in hallway  Level of Assistance Minimal assist, patient does 75% or more  Assistive Device Four wheel walker  Distance Ambulated (ft) 50 ft  Activity Response Tolerated well  Mobility Referral Yes  $Mobility charge 1 Mobility   Pt received in chair and agreed to ambulation. Pt had no c/o pain nor discomfort during session. Pt took one seated rest break on rollator seat. Pt returned to chair with all needs met and family in room.  Family is requesting two sessions a day.  Roderick Pee Mobility Specialist

## 2022-08-17 NOTE — Progress Notes (Signed)
PROGRESS NOTE    Erin Ryan  DGU:[id number] DOB: 1924-10-29 DOA: 08/10/2022 PCP: Harlan Stains, MD    Chief Complaint  Patient presents with   Altered Mental Status    Brief Narrative:  Erin Ryan is a 88 y.o. female with PMH significant for dementia, HTN, HLD, CKD, COPD, GERD, hypothyroidism, hearing loss, anxiety, arthritis, compression fracture who lives at home with her daughter. 11/19, patient was brought to the ED at [facility] by her daughter with confusion, concerning for UTI.  Per daughter, patient had some urinary symptoms last week but was improved on its own.  For the last several weeks, patient is also having progressively worsening dyspnea on exertion.   In the ED, patient was afebrile, initially heart rate in 90s, blood pressure 130/53, breathing on room air  While in the ED, she started having chest pain.  She was noted to be in A-fib with RVR with heart rate up to 160 bpm.  She was given a dose of IV metoprolol 5 mg with conversion to normal sinus rhythm with heart rate in 70s Labs showed WBC count 12.5, hemoglobin 9.8, platelet 225, sodium 135, potassium 4, BUN/creatinine 51/1.22, glucose 139, BNP elevated 524, troponin normal, lactic acid level normal Respite virus panel negative for COVID, flu Urinalysis showed cloudy yellow urine with trace hemoglobin, large leukocytes, negative nitrate, few bacteria CT angio chest showed an acute PE with small thrombus burden in the subsegmental branch of the left lower lobe with RV strain.  It also showed small patchy densities in both mid and both lower lung fields suggesting atelectasis/pneumonia, minimal bilateral pleural effusion R>L. CT head with no acute findings. Patient was started on heparin drip.  Also empirically started on IV Rocephin. Admitted to [facility].  See below for details.   Patient's hospitalization has been complicated by persistent RVR likely precipitated by action, hypoxia as well as  hyperthyroidism.   Assessment & Plan:   Principal Problem:   Acute pulmonary embolism (HCC) Active Problems:   Essential hypertension   GERD   Chronic kidney disease (CKD), stage III (moderate) (HCC)   Adult hypothyroidism   Moderate dementia without behavioral disturbance, psychotic disturbance, mood disturbance, or anxiety (HCC)   Protein-calorie malnutrition, severe   Enterococcus UTI   Atrial fibrillation with RVR (HCC)   Generalized weakness   Urinary tract infection without hematuria  #1 acute pulmonary emboli -CT angiogram chest showed acute PE with small thrombus burden subsegmental branch of the left lower lobe with RV strain as well. -Lower extremity Dopplers done negative for DVT. -2D echo done with EF of 55 to 60%, NWMA, mild asymmetric LVH of the basal septal segment, grade 2 diastolic dysfunction, low normal right ventricular systolic function, right ventricular size mildly enlarged, mildly elevated pulmonary artery systolic pressure, mildly dilated right atrial size.  Mild MVR.  Mild mitral stenosis.  Moderate mitral annular calcification.  Mild thickening of aortic valve.  Moderate AVR. -Was on heparin drip and transition to Eliquis as patient has improved clinically with decreased O2 requirements.   -Outpatient follow-up.   2.  Acute respiratory failure with hypoxia -Multifactorial secondary to pneumonia, PE, A-fib with RVR, possible volume overload. -Patient with increasing O2 requirements on 6 L nasal cannula on 08/13/2022. -IV fluids discontinued. -Repeat chest x-ray (08/13/2022) with significant increased bilateral lung opacities noted concerning for worsening edema with mild bilateral pleural effusions. -Patient with decreased breath sounds noted on examination which have improved.. -Volume overload likely secondary to A-fib with RVR. -  Hypoxia improved currently sats of 95% on room air. -Placed on Lasix 20 mg IV every 12 hours x 2 days with good urine output.   -IV Lasix have been discontinued. -Was on IV antibiotics and transitioned to oral antibiotics to complete course of antibiotic treatment for pneumonia.   -On IV heparin transitioned to Eliquis.  -Was on amiodarone drip and transitioned to oral amiodarone per cardiology. -Supportive care.  3.  A-fib with RVR -Patient with no prior history of A-fib in the past. -Noted initially on admission in the ED, responded with IV metoprolol and placed on oral metoprolol. -Patient noted to have RVR recur and unable to place on Cardizem due to significant allergy, status post 1 dose IV digoxin and patient subsequently placed on amiodarone drip. -Cardiology consulted and patient transitioned from amiodarone drip to oral amiodarone on 08/13/2022, per cardiology. -Patient noted the morning of 08/14/2022, to transiently go into A-fib with RVR while transferring to commode and went back into normal sinus rhythm while cardiology was assessing patient. -On IV heparin will transition to Eliquis today as patient also noted to have PE.  -Cardiology recommending low-dose Eliquis 2.5 mg twice daily due to patient's advanced age, renal function rather than the typical apixaban PE dosing. -Patient transitioned to Eliquis per pharmacy.   4.  Hyperthyroidism/history of hypothyroidism -Per family patient noted to have thyroid removal surgery 5 decades ago noted to be on Synthroid since then and Synthroid recently at 125 mcg's daily. -Unsure of last time Synthroid was adjusted. -TFTs obtained 08/11/2022 with a TSH significantly suppressed to < 0.01, free T4 elevated to 2.04. -Synthroid currently on hold. -Could likely discharge on a decreased dose of Synthroid with close outpatient follow-up with PCP. -May consider resuming Synthroid at a decreased dose of 100 mcg in the next 1 to 2 days.  5.  Mild bilateral pneumonia -CT chest done with small patchy densities in both mid and lower lung lobes suggestive of  atelectasis/pneumonia, minimal bilateral pleural effusion R > L. -Currently afebrile. -Leukocytosis trended down.   -Was on IV Unasyn and has been transitioned to Augmentin to complete course of antibiotic treatment.    6.  Enterococcus faecalis UTI -Was on IV Unasyn and has been transitioned to Augmentin to complete course of antibiotic treatment.     7.  Acute metabolic encephalopathy/vascular dementia/anxiety -Presenting complaints with confusion likely multifactorial secondary to UTI, pneumonia in the setting of dementia. -Mental status improving daily.  Daughter who is at bedside. -Continue antibiotics, Aricept, Cymbalta, Neurontin, Remeron.  8.  AKI on CKD stage IIIa. -Baseline creatinine < 1. -Creatinine initially noted to be slowly trending up as high as 1.36. -IV fluids discontinued creatinine down to 1.15. -Patient started on IV Lasix due to concerns for volume overload, renal function trended down to 1.12, and seems to be stabilizing at 1.22.    9.  Acute on chronic diastolic CHF -Patient with increasing respiratory hypoxia and noted to be on 6 L nasal cannula oxygen 08/13/2022. -Acute CHF exacerbation likely triggered by A-fib with RVR. -Chest x-ray done, 08/13/2022 with significant increased bilateral lung opacities noted concerning for worsening edema with mild bilateral pleural effusion. -2D echo with EF of 55 to 26%, grade 2 diastolic dysfunction, mildly enlarged RV and mildly elevated PASP. -Patient noted to have been on Norvasc, metoprolol, Lasix prior to admission. -A-fib rate improved on amiodarone and patient noted to have converted to sinus rhythm yesterday, transiently converted back to A-fib with RVR early morning of 08/14/2022,while being  transferred to commode per cardiology. -Patient subsequently went back into normal sinus rhythm. -BNP noted to be elevated at 523.6 on 08/10/2022. -Placed on Lasix 20 mg IV every 12 hours with good urine output.  -Currently on  room air with sats of 95%. -Patient being followed by cardiology who recommended holding any further diuresis at this time and on discharge able to go back to home regimen of 20 mg of furosemide every other day. -Strict I's and O's, daily weights. -Cardiology following.  10.  Hypertension -Patient noted to have been on amlodipine 2.5 mg daily, metoprolol 25 mg twice daily, Lasix 20 mg qod prior to admission. -Amlodipine and Lasix held since admission. -Patient currently on metoprolol and dose uptitrated per cardiology. -Status post IV Lasix every 12 hours for volume overload with good urine output.   -IV Lasix discontinued.   -BP stable.   -Resume home regimen Lasix on discharge per cardiology recommendations.  11.  Hyperlipidemia -Noted to have been on aspirin and statin prior to admission. -Continue statin.  12.  COPD -Patient changed from albuterol to Xopenex nebs due to A-fib with RVR. -Continue Incruse, Dulera.    13.  GERD -PPI.  14.  Impaired mobility -Continue PT/OT.-  15.  Severe protein calorie malnutrition -Continue nutritional supplementation.    16.  Oropharyngeal ulcer on hard palate -Per daughter was not there 3 months ago. -Outpatient follow-up with oral surgeon/dentist.  17.  Urinary retention -Patient noted earlier on the hospitalization to have urinary retention, Foley catheter placed. -Foley catheter discontinued 08/16/2022, patient noted to have a urine output of 725 cc over the past 24 hours, however per daughter patient with no significant urine output this morning.  Check a bladder scan.  If continued urinary retention will place Foley catheter back in and will need outpatient follow-up with urology.     DVT prophylaxis: Heparin drip>>> Eliquis Code Status: Full Family Communication: Updated patient and daughter and granddaughter at at bedside. Disposition: CIR versus SNF.  Per social work family interested in [place] at this time.   Status is:  Inpatient Remains inpatient appropriate because: Severity of illness.  Increased O2 requirements.   Consultants:  Cardiology: Dr. Marlou Porch 08/12/2022  Procedures:  CT angiogram chest 08/10/2022 CT head 08/10/2022 Chest x-ray 08/10/2022, 08/13/2022 2D echo 08/11/2022 Lower extremity Dopplers 08/11/2022  Antimicrobials:  IV Unasyn 08/11/2022>>>> 08/14/2022 IV Rocephin 08/10/2022>>> 08/11/2022 Augmentin 08/14/2022>>>> 08/18/2022   Subjective: Sitting up in chair.  Overall feels better.  Shortness of breath improved.  No chest pain.  On room air.  Daughter at bedside stating patient has not had any significant urine output today.   Objective: Vitals:   08/16/22 1952 08/16/22 2105 08/17/22 0431 08/17/22 0530  BP:  (!) 144/89  (!) 127/42  Pulse:  68  (!) 57  Resp:  16  16  Temp:  98.4 F (36.9 C)  98.4 F (36.9 C)  TempSrc:  Oral  Oral  SpO2: 94% 91%  95%  Weight:   58.6 kg   Height:        Intake/Output Summary (Last 24 hours) at 08/17/2022 1308 Last data filed at 08/17/2022 0432 Gross per 24 hour  Intake 300 ml  Output 725 ml  Net -425 ml    Filed Weights   08/14/22 0500 08/16/22 0500 08/17/22 0431  Weight: 58.4 kg 58 kg 58.6 kg    Examination:  General exam: NAD.  Pharyngeal ulcer/lesion also noted on hard palate Respiratory system: Some decreased breath sounds in the  bases otherwise clear.  No wheezing.  No crackles.  Fair air movement.  Speaking in full sentences.  Cardiovascular system: RRR no murmurs rubs or gallops.  No JVD.  No lower extremity edema.  Gastrointestinal system: Abdomen is soft, nontender, nondistended, positive bowel sounds.  No rebound.  No guarding. Central nervous system: Alert and oriented. No focal neurological deficits. Extremities: Symmetric 5 x 5 power. Skin: No rashes, lesions or ulcers Psychiatry: Judgement and insight appear fair.  Hard of hearing.  Mood & affect appropriate.     Data Reviewed: I have personally reviewed  following labs and imaging studies  CBC: Recent Labs  Lab 08/12/22 0324 08/13/22 0318 08/14/22 0319 08/15/22 0307 08/16/22 0551  WBC 14.2* 11.0* 7.8 8.4 8.6  NEUTROABS 11.1* 7.9* 5.3  --   --   HGB 10.8* 10.9* 10.7* 11.0* 11.4*  HCT 34.8* 35.6* 34.2* 35.0* 36.7  MCV 91.6 93.4 90.7 89.7 90.6  PLT 227 252 269 301 320     Basic Metabolic Panel: Recent Labs  Lab 08/13/22 0318 08/13/22 0810 08/14/22 0319 08/15/22 0307 08/16/22 0551 08/17/22 0524  NA 139  --  137 139 138 139  K 4.3  --  4.2 4.2 5.0 5.0  CL 109  --  105 104 103 104  CO2 22  --  '24 25 25 25  '$ GLUCOSE 112*  --  109* 102* 94 98  BUN 46*  --  42* 46* 46* 47*  CREATININE 1.15*  --  1.12* 1.29* 1.21* 1.22*  CALCIUM 7.4*  --  7.7* 8.4* 9.0 9.0  MG  --  1.9  --  2.0 2.2  --      GFR: Estimated Creatinine Clearance: 21.8 mL/min (A) (by C-G formula based on SCr of 1.22 mg/dL (H)).  Liver Function Tests: No results for input(s): "AST", "ALT", "ALKPHOS", "BILITOT", "PROT", "ALBUMIN" in the last 168 hours.   CBG: No results for input(s): "GLUCAP" in the last 168 hours.   Recent Results (from the past 240 hour(s))  Resp Panel by RT-PCR (Flu A&B, Covid)     Status: None   Collection Time: 08/10/22 11:53 AM   Specimen: Nasal Swab  Result Value Ref Range Status   SARS Coronavirus 2 by RT PCR NEGATIVE NEGATIVE Final    Comment: (NOTE) SARS-CoV-2 target nucleic acids are NOT DETECTED.  The SARS-CoV-2 RNA is generally detectable in upper respiratory specimens during the acute phase of infection. The lowest concentration of SARS-CoV-2 viral copies this assay can detect is 138 copies/mL. A negative result does not preclude SARS-Cov-2 infection and should not be used as the sole basis for treatment or other patient management decisions. A negative result may occur with  improper specimen collection/handling, submission of specimen other than nasopharyngeal swab, presence of viral mutation(s) within the areas  targeted by this assay, and inadequate number of viral copies(<138 copies/mL). A negative result must be combined with clinical observations, patient history, and epidemiological information. The expected result is Negative.  Fact Sheet for Patients:  EntrepreneurPulse.com.au  Fact Sheet for Healthcare Providers:  IncredibleEmployment.be  This test is no t yet approved or cleared by the [place] FDA and  has been authorized for detection and/or diagnosis of SARS-CoV-2 by FDA under an Emergency Use Authorization (EUA). This EUA will remain  in effect (meaning this test can be used) for the duration of the COVID-19 declaration under Section 564(b)(1) of the Act, 21 U.S.C.section 360bbb-3(b)(1), unless the authorization is terminated  or revoked sooner.  Influenza A by PCR NEGATIVE NEGATIVE Final   Influenza B by PCR NEGATIVE NEGATIVE Final    Comment: (NOTE) The Xpert Xpress SARS-CoV-2/FLU/RSV plus assay is intended as an aid in the diagnosis of influenza from Nasopharyngeal swab specimens and should not be used as a sole basis for treatment. Nasal washings and aspirates are unacceptable for Xpert Xpress SARS-CoV-2/FLU/RSV testing.  Fact Sheet for Patients: EntrepreneurPulse.com.au  Fact Sheet for Healthcare Providers: IncredibleEmployment.be  This test is not yet approved or cleared by the [place] FDA and has been authorized for detection and/or diagnosis of SARS-CoV-2 by FDA under an Emergency Use Authorization (EUA). This EUA will remain in effect (meaning this test can be used) for the duration of the COVID-19 declaration under Section 564(b)(1) of the Act, 21 U.S.C. section 360bbb-3(b)(1), unless the authorization is terminated or revoked.  Performed at [facility], [address], [place], [place] [address]   Urine Culture     Status: Abnormal   Collection Time:  08/10/22 11:54 AM   Specimen: Urine, Clean Catch  Result Value Ref Range Status   Specimen Description   Final    URINE, CLEAN CATCH Performed at [facility], [address] [place], [place] [address]    Special Requests   Final    NONE Performed at [facility], [address] [place], [place] [address]    Culture >=100,000 COLONIES/mL ENTEROCOCCUS FAECALIS (A)  Final   Report Status 08/12/2022 FINAL  Final   Organism ID, Bacteria ENTEROCOCCUS FAECALIS (A)  Final      Susceptibility   Enterococcus faecalis - MIC*    AMPICILLIN <=2 SENSITIVE Sensitive     NITROFURANTOIN <=16 SENSITIVE Sensitive     VANCOMYCIN 1 SENSITIVE Sensitive     * >=100,000 COLONIES/mL ENTEROCOCCUS FAECALIS  MRSA Next Gen by PCR, Nasal     Status: None   Collection Time: 08/10/22  5:52 PM   Specimen: Nasal Mucosa; Nasal Swab  Result Value Ref Range Status   MRSA by PCR Next Gen NOT DETECTED NOT DETECTED Final    Comment: (NOTE) The GeneXpert MRSA Assay (FDA approved for NASAL specimens only), is one component of a comprehensive MRSA colonization surveillance program. It is not intended to diagnose MRSA infection nor to guide or monitor treatment for MRSA infections. Test performance is not FDA approved in patients less than 71 years old. Performed at [facility], [place] [address], [place], [place] [address]          Radiology Studies: No results found.      Scheduled Meds:  amiodarone  200 mg Oral BID   amoxicillin-clavulanate  500 mg Oral Q12H   apixaban  2.5 mg Oral BID   atorvastatin  40 mg Oral QHS   Chlorhexidine Gluconate Cloth  6 each Topical Daily   donepezil  10 mg Oral QHS   DULoxetine  30 mg Oral Daily   gabapentin  100 mg Oral QHS   lactose free nutrition  237 mL Oral TID BM   metoprolol tartrate  75 mg Oral BID   mirtazapine  15 mg Oral QHS   mometasone-formoterol  2 puff Inhalation BID   pantoprazole  40 mg Oral Daily    saccharomyces boulardii  250 mg Oral BID   umeclidinium bromide  1 puff Inhalation Daily   Continuous Infusions:  sodium chloride Stopped (08/14/22 0601)     LOS: 7 days    Time spent:  40 minutes    Irine Seal, MD [facility]   To contact the attending provider between 7A-7P or the covering provider during after hours 7P-7A, please log into the web site [email or web address] and access using universal [place] password for that web site. If you do not have the password, please call the hospital operator.  08/17/2022, 1:08 PM

## 2022-08-17 NOTE — Progress Notes (Signed)
Bladder scan patient and 0 ml noted on scan

## 2022-08-17 NOTE — TOC Progression Note (Signed)
Transition of Care [facility]) - Progression Note    Patient Details  Name: Erin Ryan MRN: [id number] Date of Birth: 26-Jan-1925  Transition of Care [facility]) CM/SW Contact  Henrietta Dine, RN Phone Number: 08/17/2022, 1:29 PM  Clinical Narrative:    TOC contacted because pt's dtr would like to discuss rehab; spoke with pt's dtr, Erin Ryan [phone]) and family in room; her dtr is concerned about the pt going home with HHPT and says she can not lift and pull on the pt; she also says she has no help at home because her family work full-time; Ms Hassell Done says she would like the pt to be evaluated for IP at [facility] and if denied, she would like for pt to go to SNF; this CM explained to family that pt will have to be evaluated by CIR; also SNF process explained; they verbalized understanding; Dr Grandville Silos notified of dtr's request; awaiting eval; TOC will con't to follow.   Expected Discharge Plan: [place] Barriers to Discharge: Continued Medical Work up  Expected Discharge Plan and Services Expected Discharge Plan: [employer] In-house Referral: NA Discharge Planning Services: CM Consult   Living arrangements for the past 2 months: Single Family Home                 DME Arranged: N/A DME Agency: NA                   Social Determinants of Health (SDOH) Interventions    Readmission Risk Interventions     No data to display

## 2022-08-18 DIAGNOSIS — R531 Weakness: Secondary | ICD-10-CM | POA: Diagnosis not present

## 2022-08-18 DIAGNOSIS — I2699 Other pulmonary embolism without acute cor pulmonale: Secondary | ICD-10-CM | POA: Diagnosis not present

## 2022-08-18 DIAGNOSIS — N390 Urinary tract infection, site not specified: Secondary | ICD-10-CM | POA: Diagnosis not present

## 2022-08-18 DIAGNOSIS — I4891 Unspecified atrial fibrillation: Secondary | ICD-10-CM | POA: Diagnosis not present

## 2022-08-18 LAB — BASIC METABOLIC PANEL
Anion gap: 9 (ref 5–15)
BUN: 42 mg/dL — ABNORMAL HIGH (ref 8–23)
CO2: 24 mmol/L (ref 22–32)
Calcium: 9.4 mg/dL (ref 8.9–10.3)
Chloride: 105 mmol/L (ref 98–111)
Creatinine, Ser: 1.25 mg/dL — ABNORMAL HIGH (ref 0.44–1.00)
GFR, Estimated: 40 mL/min — ABNORMAL LOW (ref >60)
Glucose, Bld: 112 mg/dL — ABNORMAL HIGH (ref 70–99)
Potassium: 5 mmol/L (ref 3.5–5.1)
Sodium: 138 mmol/L (ref 135–145)

## 2022-08-18 MED ORDER — LEVOTHYROXINE SODIUM 100 MCG PO TABS
100.0000 ug | ORAL_TABLET | Freq: Every day | ORAL | Status: DC
  Administered 2022-08-19 – 2022-08-20 (×2): 100 ug via ORAL
  Filled 2022-08-18 (×2): qty 1

## 2022-08-18 MED ORDER — BOOST PLUS PO LIQD
237.0000 mL | Freq: Two times a day (BID) | ORAL | Status: DC
  Administered 2022-08-19 – 2022-08-20 (×3): 237 mL via ORAL
  Filled 2022-08-18 (×5): qty 237

## 2022-08-18 MED ORDER — ADULT MULTIVITAMIN W/MINERALS CH: 1.0000 | ORAL_TABLET | Freq: Every day | ORAL | Status: DC

## 2022-08-18 MED ORDER — ENSURE ENLIVE PO LIQD
237.0000 mL | ORAL | Status: DC
  Administered 2022-08-18: 237 mL via ORAL

## 2022-08-18 MED ORDER — AMIODARONE HCL 200 MG PO TABS: 200.0000 mg | ORAL_TABLET | Freq: Every day | ORAL | Status: DC

## 2022-08-18 MED ORDER — RENA-VITE PO TABS
1.0000 | ORAL_TABLET | Freq: Every day | ORAL | Status: DC
  Administered 2022-08-19: 1 via ORAL
  Filled 2022-08-18 (×2): qty 1

## 2022-08-18 NOTE — Progress Notes (Addendum)
Inpatient Rehabilitation Admissions Coordinator   Rehab consult received. I have left a voicemail for daughter to contact me to discuss Patient's rehab options.  Danne Baxter, RN, MSN Rehab Admissions Coordinator [phone] 08/18/2022 9:56 AM  I spoke with daughter by phone. I recommend SNF level rehab at this time for patient not at a level that daughter can manage at home at this time. She is not a candidate for CIR Level rehab. I recommend SNF and daughter is in agreement. We will sign off.  Danne Baxter, RN, MSN Rehab Admissions Coordinator [phone] 08/18/2022 10:23 AM

## 2022-08-18 NOTE — Progress Notes (Signed)
Mobility Specialist - Progress Note   08/18/22 1457  Mobility  Activity Ambulated with assistance in hallway  Level of Assistance Contact guard assist, steadying assist  Assistive Device Front wheel walker  Distance Ambulated (ft) 50 ft  Range of Motion/Exercises Active  Activity Response Tolerated well  Mobility Referral Yes  $Mobility charge 1 Mobility   Pt was found on recliner chair and agreeable to ambulate. Was min-A from sit to stand and was transferred to [facility] before session. C/o knee pain stating that she has not gotten her shots during ambulation. At EOS returned to recliner chair and family in room and RN notified of session.  Ferd Hibbs Mobility Specialist

## 2022-08-18 NOTE — Progress Notes (Signed)
Mobility Specialist - Progress Note  Pre-mobility: 62 bpm HR, 98% SpO2 During mobility: 69 bpm HR, 91% SpO2   08/18/22 1042  Mobility  Activity Ambulated with assistance in hallway  Level of Assistance Minimal assist, patient does 75% or more  Assistive Device Front wheel walker  Distance Ambulated (ft) 75 ft  Range of Motion/Exercises Active  Activity Response Tolerated well  Mobility Referral Yes  $Mobility charge 1 Mobility   Pt was found on recliner chair and agreeable to ambulate. Before beginning ambulation stated feeling very cold and from sit to stand was min-A. During ambulation stated feeling good but towards the end was c/o knee pain. Pt sat on chair in hallway and was rolled back to room due to her saying she felt like she was peeing on herself. NT aided in rolling the pt back to room and was left in room with NT.  Ferd Hibbs Mobility Specialist

## 2022-08-18 NOTE — TOC Progression Note (Addendum)
Transition of Care [facility]) - Progression Note    Patient Details  Name: Erin Ryan MRN: [id number] Date of Birth: April 30, 1925  Transition of Care [facility]) CM/SW Contact  Mackenna Kamer, Juliann Pulse, RN Phone Number: 08/18/2022, 1:08 PM  Clinical Narrative:  Faxed out await bed offers per conversation with dtr [name]-prefers [facility]-explained to send info to all facilities then she can determine which one place she wants.   Faxed fl2 to [employer], & manually faxed to [employer]. Await bed offers.   Expected Discharge Plan: [place] Barriers to Discharge: Continued Medical Work up  Expected Discharge Plan and Services Expected Discharge Plan: [place] In-house Referral: NA Discharge Planning Services: CM Consult   Living arrangements for the past 2 months: Single Family Home                 DME Arranged: N/A DME Agency: NA                   Social Determinants of Health (SDOH) Interventions    Readmission Risk Interventions     No data to display

## 2022-08-18 NOTE — Progress Notes (Signed)
PROGRESS NOTE    Erin Ryan  JGG:[id number] DOB: 26-Jul-1925 DOA: 08/10/2022 PCP: Harlan Stains, MD    Chief Complaint  Patient presents with   Altered Mental Status    Brief Narrative:  Erin Ryan is a 88 y.o. female with PMH significant for dementia, HTN, HLD, CKD, COPD, GERD, hypothyroidism, hearing loss, anxiety, arthritis, compression fracture who lives at home with her daughter. 11/19, patient was brought to the ED at [facility] by her daughter with confusion, concerning for UTI.  Per daughter, patient had some urinary symptoms last week but was improved on its own.  For the last several weeks, patient is also having progressively worsening dyspnea on exertion.   In the ED, patient was afebrile, initially heart rate in 90s, blood pressure 130/53, breathing on room air  While in the ED, she started having chest pain.  She was noted to be in A-fib with RVR with heart rate up to 160 bpm.  She was given a dose of IV metoprolol 5 mg with conversion to normal sinus rhythm with heart rate in 70s Labs showed WBC count 12.5, hemoglobin 9.8, platelet 225, sodium 135, potassium 4, BUN/creatinine 51/1.22, glucose 139, BNP elevated 524, troponin normal, lactic acid level normal Respite virus panel negative for COVID, flu Urinalysis showed cloudy yellow urine with trace hemoglobin, large leukocytes, negative nitrate, few bacteria CT angio chest showed an acute PE with small thrombus burden in the subsegmental branch of the left lower lobe with RV strain.  It also showed small patchy densities in both mid and both lower lung fields suggesting atelectasis/pneumonia, minimal bilateral pleural effusion R>L. CT head with no acute findings. Patient was started on heparin drip.  Also empirically started on IV Rocephin. Admitted to [facility].  See below for details.   Patient's hospitalization has been complicated by persistent RVR likely precipitated by action, hypoxia as well as  hyperthyroidism.   Assessment & Plan:   Principal Problem:   Acute pulmonary embolism (HCC) Active Problems:   Essential hypertension   GERD   Chronic kidney disease (CKD), stage III (moderate) (HCC)   Adult hypothyroidism   Moderate dementia without behavioral disturbance, psychotic disturbance, mood disturbance, or anxiety (HCC)   Protein-calorie malnutrition, severe   Enterococcus UTI   Atrial fibrillation with RVR (HCC)   Generalized weakness   Urinary tract infection without hematuria  #1 acute pulmonary emboli -CT angiogram chest showed acute PE with small thrombus burden subsegmental branch of the left lower lobe with RV strain as well. -Lower extremity Dopplers done negative for DVT. -2D echo done with EF of 55 to 60%, NWMA, mild asymmetric LVH of the basal septal segment, grade 2 diastolic dysfunction, low normal right ventricular systolic function, right ventricular size mildly enlarged, mildly elevated pulmonary artery systolic pressure, mildly dilated right atrial size.  Mild MVR.  Mild mitral stenosis.  Moderate mitral annular calcification.  Mild thickening of aortic valve.  Moderate AVR. -Was on heparin drip and transitioned to Eliquis as patient has improved clinically with decreased O2 requirements.   -Outpatient follow-up.   2.  Acute respiratory failure with hypoxia -Multifactorial secondary to pneumonia, PE, A-fib with RVR, possible volume overload. -Patient with increasing O2 requirements on 6 L nasal cannula on 08/13/2022. -IV fluids discontinued. -Repeat chest x-ray (08/13/2022) with significant increased bilateral lung opacities noted concerning for worsening edema with mild bilateral pleural effusions. -Patient with decreased breath sounds noted on examination which have improved.. -Volume overload likely secondary to A-fib with RVR. -  Hypoxia improved currently sats of 93-99% on room air. -Status post Lasix 20 mg IV every 12 hours x 2 days with good urine  output.  -IV Lasix have been discontinued. -Was on IV antibiotics and transitioned to oral antibiotics to complete course of antibiotic treatment for pneumonia.   -Was on IV heparin and has been transitioned to Eliquis.  -Was on amiodarone drip and transitioned to oral amiodarone per cardiology. -Supportive care.  3.  A-fib with RVR -Patient with no prior history of A-fib in the past. -Noted initially on admission in the ED, responded with IV metoprolol and placed on oral metoprolol. -Patient noted to have RVR recur and unable to place on Cardizem due to significant allergy, status post 1 dose IV digoxin and patient subsequently placed on amiodarone drip. -Cardiology consulted and patient transitioned from amiodarone drip to oral amiodarone on 08/13/2022, per cardiology. -Cardiology recommended amiodarone 200 mg twice daily x 1 week and then 200 mg daily thereafter. -Patient noted the morning of 08/14/2022, to transiently go into A-fib with RVR while transferring to commode and went back into normal sinus rhythm while cardiology was assessing patient. -Was on IV heparin and transition to Eliquis as patient also noted to have PE.  -Cardiology recommending low-dose Eliquis 2.5 mg twice daily due to patient's advanced age, renal function rather than the typical apixaban PE dosing. -Patient transitioned to Eliquis per pharmacy.   4.  Hyperthyroidism/history of hypothyroidism -Per family patient noted to have thyroid removal surgery 5 decades ago noted to be on Synthroid since then and Synthroid recently at 125 mcg's daily. -Unsure of last time Synthroid was adjusted. -TFTs obtained 08/11/2022 with a TSH significantly suppressed to < 0.01, free T4 elevated to 2.04. -Synthroid currently on hold. -Could likely discharge on a decreased dose of Synthroid with close outpatient follow-up with PCP. -Resume Synthroid tomorrow at 100 mcg daily.   -Outpatient follow-up with PCP.   5.  Mild bilateral  pneumonia -CT chest done with small patchy densities in both mid and lower lung lobes suggestive of atelectasis/pneumonia, minimal bilateral pleural effusion R > L. -Currently afebrile. -Leukocytosis trended down.   -Was on IV Unasyn and transitioned to Augmentin to complete course of antibiotic treatment.    6.  Enterococcus faecalis UTI -Was on IV Unasyn and transitioned to Augmentin to complete course of antibiotic treatment.     7.  Acute metabolic encephalopathy/vascular dementia/anxiety -Presenting complaints with confusion likely multifactorial secondary to UTI, pneumonia in the setting of dementia. -Mental status improving daily.  Daughter who is at bedside. -Continue antibiotics, Aricept, Cymbalta, Neurontin, Remeron.  8.  AKI on CKD stage IIIa. -Baseline creatinine < 1. -Creatinine initially noted to be slowly trending up as high as 1.36. -IV fluids discontinued creatinine down to 1.15 -Patient started on IV Lasix due to concerns for volume overload, renal function trended down to 1.12, and seems to be stabilizing at 1.25.    9.  Acute on chronic diastolic CHF -Patient with increasing respiratory hypoxia and noted to be on 6 L nasal cannula oxygen 08/13/2022. -Acute CHF exacerbation likely triggered by A-fib with RVR. -Chest x-ray done, 08/13/2022 with significant increased bilateral lung opacities noted concerning for worsening edema with mild bilateral pleural effusion. -2D echo with EF of 55 to 03%, grade 2 diastolic dysfunction, mildly enlarged RV and mildly elevated PASP. -Patient noted to have been on Norvasc, metoprolol, Lasix prior to admission. -A-fib rate improved on amiodarone and patient noted to have converted to sinus rhythm, transiently  converted back to A-fib with RVR early morning of 08/14/2022,while being transferred to commode per cardiology. -Patient subsequently went back into normal sinus rhythm. -BNP noted to be elevated at 523.6 on 08/10/2022. -Status  post Lasix 20 mg IV every 12 hours with good urine output.  -Currently on room air with sats of 99%. -Patient being followed by cardiology who recommended holding any further diuresis at this time and on discharge able to go back to home regimen of 20 mg of furosemide every other day. -Strict I's and O's, daily weights. -Cardiology following.  10.  Hypertension -Patient noted to have been on amlodipine 2.5 mg daily, metoprolol 25 mg twice daily, Lasix 20 mg qod prior to admission. -Amlodipine and Lasix held since admission. -Patient currently on metoprolol and dose uptitrated per cardiology. -Status post IV Lasix every 12 hours for volume overload with good urine output.   -IV Lasix discontinued.   -BP stable.   -Resume home regimen Lasix on discharge per cardiology recommendations.  11.  Hyperlipidemia -Noted to have been on aspirin and statin prior to admission. -Continue statin.  12.  COPD -Patient changed from albuterol to Xopenex nebs due to A-fib with RVR. -Continue Incruse, Dulera.    13.  GERD -Continue PPI.  14.  Impaired mobility -Continue PT/OT.- -Likely needs SNF.  15.  Severe protein calorie malnutrition -Nutritional supplementation.    16.  Oropharyngeal ulcer on hard palate -Per daughter was not there 3 months ago. -Outpatient follow-up with oral surgeon/dentist.  17.  Urinary retention -Patient noted earlier on the hospitalization to have urinary retention, Foley catheter placed. -Foley catheter discontinued 08/16/2022, patient noted to good urine output per RN.  Bladder scan which was checked showed 0 cc.     DVT prophylaxis: Heparin drip>>> Eliquis Code Status: Full Family Communication: Updated patient and daughter at bedside. Disposition: SNF.  Patient deemed not a candidate for CIR for CIR.  Status is: Inpatient Remains inpatient appropriate because: Severity of illness.  Increased O2 requirements.   Consultants:  Cardiology: Dr. Marlou Porch  08/12/2022  Procedures:  CT angiogram chest 08/10/2022 CT head 08/10/2022 Chest x-ray 08/10/2022, 08/13/2022 2D echo 08/11/2022 Lower extremity Dopplers 08/11/2022  Antimicrobials:  IV Unasyn 08/11/2022>>>> 08/14/2022 IV Rocephin 08/10/2022>>> 08/11/2022 Augmentin 08/14/2022>>>> 08/18/2022   Subjective: Sitting up at the side of the bed working with OT.  Noted to have gone to the bathroom and back and feeling somewhat fatigued.  No chest pain.  No shortness of breath.   Objective: Vitals:   08/17/22 2236 08/18/22 0414 08/18/22 0440 08/18/22 0916  BP: (!) 145/54  (!) 133/57   Pulse: 78  (!) 57   Resp:   14   Temp:   97.8 F (36.6 C)   TempSrc:   Oral   SpO2:   92% 93%  Weight:  58 kg    Height:        Intake/Output Summary (Last 24 hours) at 08/18/2022 1303 Last data filed at 08/18/2022 0940 Gross per 24 hour  Intake 250 ml  Output --  Net 250 ml    Filed Weights   08/16/22 0500 08/17/22 0431 08/18/22 0414  Weight: 58 kg 58.6 kg 58 kg    Examination:  General exam: NAD.  Pharyngeal ulcer/lesion also noted on hard palate Respiratory system: Decreased breath sounds in the bases otherwise clear.  No wheezing, no crackles.  Fair air movement.  Speaking in full sentences.  Cardiovascular system: Regular rate rhythm no murmurs rubs or gallops.  No JVD.  No lower extremity edema. Gastrointestinal system: Abdomen is soft, nontender, nondistended, positive bowel sounds.  No rebound.  No guarding.  Central nervous system: Alert and oriented. No focal neurological deficits. Extremities: Symmetric 5 x 5 power. Skin: No rashes, lesions or ulcers Psychiatry: Judgement and insight appear fair.  Hard of hearing.  Mood & affect appropriate.     Data Reviewed: I have personally reviewed following labs and imaging studies  CBC: Recent Labs  Lab 08/12/22 0324 08/13/22 0318 08/14/22 0319 08/15/22 0307 08/16/22 0551  WBC 14.2* 11.0* 7.8 8.4 8.6  NEUTROABS 11.1* 7.9* 5.3   --   --   HGB 10.8* 10.9* 10.7* 11.0* 11.4*  HCT 34.8* 35.6* 34.2* 35.0* 36.7  MCV 91.6 93.4 90.7 89.7 90.6  PLT 227 252 269 301 320     Basic Metabolic Panel: Recent Labs  Lab 08/13/22 0810 08/14/22 0319 08/15/22 0307 08/16/22 0551 08/17/22 0524 08/18/22 0418  NA  --  137 139 138 139 138  K  --  4.2 4.2 5.0 5.0 5.0  CL  --  105 104 103 104 105  CO2  --  '24 25 25 25 24  '$ GLUCOSE  --  109* 102* 94 98 112*  BUN  --  42* 46* 46* 47* 42*  CREATININE  --  1.12* 1.29* 1.21* 1.22* 1.25*  CALCIUM  --  7.7* 8.4* 9.0 9.0 9.4  MG 1.9  --  2.0 2.2  --   --      GFR: Estimated Creatinine Clearance: 21.3 mL/min (A) (by C-G formula based on SCr of 1.25 mg/dL (H)).  Liver Function Tests: No results for input(s): "AST", "ALT", "ALKPHOS", "BILITOT", "PROT", "ALBUMIN" in the last 168 hours.   CBG: No results for input(s): "GLUCAP" in the last 168 hours.   Recent Results (from the past 240 hour(s))  Resp Panel by RT-PCR (Flu A&B, Covid)     Status: None   Collection Time: 08/10/22 11:53 AM   Specimen: Nasal Swab  Result Value Ref Range Status   SARS Coronavirus 2 by RT PCR NEGATIVE NEGATIVE Final    Comment: (NOTE) SARS-CoV-2 target nucleic acids are NOT DETECTED.  The SARS-CoV-2 RNA is generally detectable in upper respiratory specimens during the acute phase of infection. The lowest concentration of SARS-CoV-2 viral copies this assay can detect is 138 copies/mL. A negative result does not preclude SARS-Cov-2 infection and should not be used as the sole basis for treatment or other patient management decisions. A negative result may occur with  improper specimen collection/handling, submission of specimen other than nasopharyngeal swab, presence of viral mutation(s) within the areas targeted by this assay, and inadequate number of viral copies(<138 copies/mL). A negative result must be combined with clinical observations, patient history, and epidemiological information. The  expected result is Negative.  Fact Sheet for Patients:  EntrepreneurPulse.com.au  Fact Sheet for Healthcare Providers:  IncredibleEmployment.be  This test is no t yet approved or cleared by the [place] FDA and  has been authorized for detection and/or diagnosis of SARS-CoV-2 by FDA under an Emergency Use Authorization (EUA). This EUA will remain  in effect (meaning this test can be used) for the duration of the COVID-19 declaration under Section 564(b)(1) of the Act, 21 U.S.C.section 360bbb-3(b)(1), unless the authorization is terminated  or revoked sooner.       Influenza A by PCR NEGATIVE NEGATIVE Final   Influenza B by PCR NEGATIVE NEGATIVE Final    Comment: (NOTE) The Xpert Xpress SARS-CoV-2/FLU/RSV plus assay  is intended as an aid in the diagnosis of influenza from Nasopharyngeal swab specimens and should not be used as a sole basis for treatment. Nasal washings and aspirates are unacceptable for Xpert Xpress SARS-CoV-2/FLU/RSV testing.  Fact Sheet for Patients: EntrepreneurPulse.com.au  Fact Sheet for Healthcare Providers: IncredibleEmployment.be  This test is not yet approved or cleared by the [place] FDA and has been authorized for detection and/or diagnosis of SARS-CoV-2 by FDA under an Emergency Use Authorization (EUA). This EUA will remain in effect (meaning this test can be used) for the duration of the COVID-19 declaration under Section 564(b)(1) of the Act, 21 U.S.C. section 360bbb-3(b)(1), unless the authorization is terminated or revoked.  Performed at [facility], [address] [place], [place] [address]   Urine Culture     Status: Abnormal   Collection Time: 08/10/22 11:54 AM   Specimen: Urine, Clean Catch  Result Value Ref Range Status   Specimen Description   Final    URINE, CLEAN CATCH Performed at [facility], [address] [place], [place] [address]    Special Requests   Final    NONE Performed at [facility], [address] [place], [place] [address]    Culture >=100,000 COLONIES/mL ENTEROCOCCUS FAECALIS (A)  Final   Report Status 08/12/2022 FINAL  Final   Organism ID, Bacteria ENTEROCOCCUS FAECALIS (A)  Final      Susceptibility   Enterococcus faecalis - MIC*    AMPICILLIN <=2 SENSITIVE Sensitive     NITROFURANTOIN <=16 SENSITIVE Sensitive     VANCOMYCIN 1 SENSITIVE Sensitive     * >=100,000 COLONIES/mL ENTEROCOCCUS FAECALIS  MRSA Next Gen by PCR, Nasal     Status: None   Collection Time: 08/10/22  5:52 PM   Specimen: Nasal Mucosa; Nasal Swab  Result Value Ref Range Status   MRSA by PCR Next Gen NOT DETECTED NOT DETECTED Final    Comment: (NOTE) The GeneXpert MRSA Assay (FDA approved for NASAL specimens only), is one component of a comprehensive MRSA colonization surveillance program. It is not intended to diagnose MRSA infection nor to guide or monitor treatment for MRSA infections. Test performance is not FDA approved in patients less than 60 years old. Performed at [facility], [place] [address], [place], [place] [address]          Radiology Studies: No results found.      Scheduled Meds:  amiodarone  200 mg Oral BID   [START ON 08/21/2022] amiodarone  200 mg Oral Daily   apixaban  2.5 mg Oral BID   atorvastatin  40 mg Oral QHS   donepezil  10 mg Oral QHS   DULoxetine  30 mg Oral Daily   feeding supplement  237 mL Oral Q24H   gabapentin  100 mg Oral QHS   lactose free nutrition  237 mL Oral BID BM   [START ON 08/19/2022] levothyroxine  100 mcg Oral QAC breakfast   metoprolol tartrate  75 mg Oral BID   mirtazapine  15 mg Oral QHS   mometasone-formoterol  2 puff Inhalation BID   pantoprazole  40 mg Oral Daily   saccharomyces boulardii  250 mg Oral BID   umeclidinium bromide  1 puff Inhalation Daily   Continuous Infusions:  sodium chloride Stopped  (08/14/22 0601)     LOS: 8 days    Time spent: 35 minutes    Irine Seal, MD [facility]   To  contact the attending provider between 7A-7P or the covering provider during after hours 7P-7A, please log into the web site [email or web address] and access using universal [place] password for that web site. If you do not have the password, please call the hospital operator.  08/18/2022, 1:03 PM

## 2022-08-18 NOTE — Progress Notes (Signed)
[place] for apixaban Indication: pulmonary embolus  Allergies  Allergen Reactions   Ambrosia Trifida (Tall Ragweed) Allergy Skin Test Other (See Comments)    Other reaction(s): Other   Hydrochlorothiazide     hyponatremia   Lyrica [Pregabalin] Other (See Comments)    Severe confusion   Diltiazem Hcl Er Rash   Short Ragweed Pollen Ext     Other reaction(s): Other    Patient Measurements: Height: '5\' 2"'$  (157.5 cm) Weight: 58 kg (127 lb 13.9 oz) IBW/kg (Calculated) : 50.1  Vital Signs: Temp: 97.8 F (36.6 C) (11/27 0440) Temp Source: Oral (11/27 0440) BP: 133/57 (11/27 0440) Pulse Rate: 57 (11/27 0440)  Labs: Recent Labs    08/16/22 0551 08/17/22 0524 08/18/22 0418  HGB 11.4*  --   --   HCT 36.7  --   --   PLT 320  --   --   HEPARINUNFRC >1.10*  --   --   CREATININE 1.21* 1.22* 1.25*     Estimated Creatinine Clearance: 21.3 mL/min (A) (by C-G formula based on SCr of 1.25 mg/dL (H)).  Assessment: 89 yoF with a history of HLD, HTN. Not on anticoagulants PTA. Pt with newly diagnosed afib as well as small acute PE. Pt initially anticoagulated with heparin drip. Cardiology consulted, anticoagulation was transitioned to apixaban on 11/24.   Today, 08/18/2022: CBC (last labs 11/25): Hgb slightly low but stable; Plt WNL SCr is stable ~1.2. CrCl ~24 mL/min   Plan:  Continue apixaban 2.5 mg PO BID (dose recommended by cardiology and confirmed with Dr. Marlou Porch given patient's advanced age and renal function rather than typical apixaban PE dosing) Education/manufacturer coupon provided to patient's daughter on 11/25.  Pharmacy to sign off.   Lenis Noon, PharmD, BCPS Clinical Pharmacist 08/18/2022 7:43 AM

## 2022-08-18 NOTE — Plan of Care (Signed)
  Problem: Education: Goal: Knowledge of General Education information will improve Description: Including pain rating scale, medication(s)/side effects and non-pharmacologic comfort measures Outcome: Completed/Met   Problem: Activity: Goal: Risk for activity intolerance will decrease Outcome: Progressing   

## 2022-08-18 NOTE — Progress Notes (Signed)
Nutrition Follow-up  DOCUMENTATION CODES:   Severe malnutrition in context of chronic illness  INTERVENTION:  - Continue Regular diet - Boost Plus po BID, each supplement provides 360 kcal and 14 grams of protein. - Ensure Enlive po once daily, provides 350 kcal and 20 grams of protein. - Encourage intake of meals and supplements. Family to aid in ordering meals and feeding patient to encourage intake.  - Daily Rena-vit to support micronutrient needs. - Monitor weight trends.   NUTRITION DIAGNOSIS:   Severe Malnutrition related to chronic illness (CKD, COPD, dementia) as evidenced by severe fat depletion, severe muscle depletion. *ongoing  GOAL:   Patient will meet greater than or equal to 90% of their needs *not being met, supplements ordered  MONITOR:   PO intake, Supplement acceptance, Weight trends  REASON FOR ASSESSMENT:   Consult Assessment of nutrition requirement/status  ASSESSMENT:   88 y.o. female with PMH significant for dementia, HTN, HLD, CKD, COPD, GERD, hypothyroidism, hearing loss, anxiety, arthritis, compression fracture who lives at home with her daughter who presented with confusion, concerning for UTI.  Met with patient and daughter at bedside this morning. Patient states she had to use the bathroom, informed RN. Daughter supplied all information, patient noted to have dementia.  Daughter states her mom does not like the food and as a result has not been eating well. Per RN documentation of intake patient consuming 0-75% of meals, average of 26%. Daughter states food is hit or miss and she tries to order patient what she likes but often food will come very tough. She also notes that although patient has been drinking the Boost supplements three times she doesn't drink all of them. Patient drinks regular Boost at home and not Boost Plus so she doesn't like it as much.  Encouraged daughter to order patient foods she likes and discussed that her mom is on a  regular diet so even food from outside the hospital or in cafeteria could be offered. As patient doesn't love the available Boost but has been drinking, offered Ensure as another option to support nutrition and daughter agreeable. Will change orders for patient to receive both Boost and Ensure.  Thankfully, weight appears stable during admission. Will continue to monitor intake. Daughter also had concerns about once patient discharge and not being able to care for her herself at home. Informed TOC of patients concerns.   Medications  reviewed and include: Synthroid, Remeron  Labs reviewed:  Creatinine 1.25   Diet Order:   Diet Order             Diet regular Room service appropriate? Yes; Fluid consistency: Thin  Diet effective now                   EDUCATION NEEDS:  No education needs have been identified at this time  Skin:  Skin Assessment: Reviewed RN Assessment  Last BM:  11/26  Height:  Ht Readings from Last 1 Encounters:  08/10/22 5' 2" (1.575 m)   Weight:  Wt Readings from Last 1 Encounters:  08/18/22 58 kg    BMI:  Body mass index is 23.39 kg/m.  Estimated Nutritional Needs:  Kcal:  7494-4967 kcals Protein:  80-90 grams Fluid:  >/= 1.75L    Samson Frederic RD, LDN For contact information, refer to [facility].

## 2022-08-18 NOTE — Progress Notes (Addendum)
Occupational Therapy Treatment Patient Details Name: Erin Ryan MRN: [id number] DOB: [birth year] Today's Date: 08/18/2022   History of present illness Pt admitted from home with AMS and weakness and diagnosed with acute PE, afib with RVR, bil PNA, UTI, AKI on CKD3a, and acute metabolic encephalopathy.  Pt with hx of dementia, htn, CKD and COPD   OT comments  Patient was noted to have improved ability to complete transfers with patient min guard to transfer from recliner and standard commode in room with increased time to transition into standing with RW. Patient ws able to complete hygiene in standing with min guard with no LOB noted. Patient completed bed mobility with min verbal cues for proper sequencing of task with increased time. Patient would continue to benefit from skilled OT services at this time while admitted and after d/c to address noted deficits in order to improve overall safety and independence in ADLs.     Recommendations for follow up therapy are one component of a multi-disciplinary discharge planning process, led by the attending physician.  Recommendations may be updated based on patient status, additional functional criteria and insurance authorization.    Follow Up Recommendations  Home health OT (v.s. SNF pending level of assistance at home)     Assistance Recommended at Discharge Intermittent Supervision/Assistance  Patient can return home with the following  A little help with walking and/or transfers;A little help with bathing/dressing/bathroom;Assistance with cooking/housework;Direct supervision/assist for financial management;Direct supervision/assist for medications management;Help with stairs or ramp for entrance   Equipment Recommendations  None recommended by OT    Recommendations for Other Services      Precautions / Restrictions Precautions Precautions: Fall Precaution Comments: monitor O2 sats Restrictions Weight Bearing Restrictions: No        Mobility Bed Mobility Overal bed mobility: Needs Assistance Bed Mobility: Supine to Sit, Sit to Supine     Supine to sit: Min guard, HOB elevated Sit to supine: Min guard   General bed mobility comments: cues for proper sequencing    Transfers Overall transfer level: Needs assistance Equipment used: Rolling walker (2 wheels) Transfers: Sit to/from Stand Sit to Stand: Min guard           General transfer comment: with increased time O2 was [facility] during session on 2L     Balance Overall balance assessment: Mild deficits observed, not formally tested Sitting-balance support: No upper extremity supported, Feet supported Sitting balance-Leahy Scale: Good     Standing balance support: During functional activity Standing balance-Leahy Scale: Fair           ADL either performed or assessed with clinical judgement   ADL Overall ADL's : Needs assistance/impaired           Toilet Transfer: Min guard;Regular Toilet;Rolling walker (2 wheels) Toilet Transfer Details (indicate cue type and reason): patient needed increased time to transition from sit to stand during session but able to complete transfer with cues and RW. patients daughter was present in room and educated on using cues for proper technique. patients daughter was notably agitated with d/c planning process at this time. MD in room at end of session made aware. Toileting- Water quality scientist and Hygiene: Min guard;Sit to/from stand Toileting - Clothing Manipulation Details (indicate cue type and reason): with increased time and cues for sequencing of task.       General ADL Comments: patient's daughter expressed concerns over bed mobility at home.patient's daughter was educated that therapy session could focus on development of cues to complete bed  mobility with less caregiver burden. patients daughter listed other issues that would prevent her from being able to care for patient in next level of care.  patient  completed bed mobility with min guard and verbal cues for sequencing of task. patients daughter was unfortunately not in room at this time as she had taken a phone call in the hallway. hospitalist in room was educated on assist level at this time.      Cognition Arousal/Alertness: Awake/alert Behavior During Therapy: WFL for tasks assessed/performed Overall Cognitive Status: History of cognitive impairments - at baseline               General Comments: patient is [facility] but able to follow commands.              General Comments noted to have some flexion in BLE during standing tasks with occasional no UE support. patient was able to complete task with no LOB    Pertinent Vitals/ Pain       Pain Assessment Pain Assessment: No/denies pain         Frequency  Min 2X/week        Progress Toward Goals  OT Goals(current goals can now be found in the care plan section)  Progress towards OT goals: Progressing toward goals     Plan Discharge plan remains appropriate       AM-PAC OT "6 Clicks" Daily Activity     Outcome Measure   Help from another person eating meals?: A Little Help from another person taking care of personal grooming?: A Little Help from another person toileting, which includes using toliet, bedpan, or urinal?: A Little Help from another person bathing (including washing, rinsing, drying)?: A Little Help from another person to put on and taking off regular upper body clothing?: A Little Help from another person to put on and taking off regular lower body clothing?: A Little 6 Click Score: 18    End of Session Equipment Utilized During Treatment: Rolling walker (2 wheels);Oxygen  OT Visit Diagnosis: Muscle weakness (generalized) (M62.81)   Activity Tolerance Patient tolerated treatment well   Patient Left in chair;with call bell/phone within reach;with family/visitor present;with nursing/sitter in room   Nurse Communication Mobility status         Time: 1214-1229 OT Time Calculation (min): 15 min  Charges: OT General Charges $OT Visit: 1 Visit OT Treatments $Self Care/Home Management : 8-22 mins  Rennie Plowman, MS Acute Rehabilitation Department Office# [phone]   Willa Rough 08/18/2022, 12:44 PM

## 2022-08-18 NOTE — NC FL2 (Signed)
[place] LEVEL OF CARE SCREENING TOOL     IDENTIFICATION  [name] Birthdate: Jun 21, 1925 Sex: female Admission Date (Current Location): 08/10/2022  [facility] and [place] Number:  Herbalist and Address:  [facility],  [place], [place]      Provider Number: [id number]  Attending Physician Name and Address:  Eugenie Filler, MD  Relative Name and Phone Number:   Darl Householder ([phone])    Current Level of Care: Hospital Recommended Level of Care: [place] Prior Approval Number:    Date Approved/Denied:   PASRR Number:  [id number])  Discharge Plan: SNF    Current Diagnoses: Patient Active Problem List   Diagnosis Date Noted   Enterococcus UTI 08/13/2022   Atrial fibrillation with RVR ([place]) 08/13/2022   Generalized weakness 08/13/2022   Urinary tract infection without hematuria 08/13/2022   Protein-calorie malnutrition, severe 08/12/2022   Acute pulmonary embolism ([place]) 08/10/2022   Moderate dementia without behavioral disturbance, psychotic disturbance, mood disturbance, or anxiety ([place]) 11/05/2021   Tachycardia 10/13/2021   Upper airway cough syndrome 07/10/2017   Dyspnea on exertion 05/14/2015   Anxiety state 06/26/2014   Chronic kidney disease (CKD), stage III (moderate) (HCC) 06/26/2014   HLD (hyperlipidemia) 06/26/2014   Acid reflux 06/26/2014   Benign hypertensive kidney disease 06/26/2014   OP (osteoporosis) 06/26/2014   Peripheral neuralgia 06/26/2014   Adult hypothyroidism 06/26/2014   Arthralgia of hip or thigh 02/09/2014   GERD 12/06/2007   HYPERLIPIDEMIA 11/15/2007   Essential hypertension 11/15/2007    Orientation RESPIRATION BLADDER Height & Weight     Self, Situation, Place  Normal External catheter Weight: 58 kg Height:  '5\' 2"'$  (157.5 cm)  BEHAVIORAL SYMPTOMS/MOOD NEUROLOGICAL BOWEL NUTRITION STATUS      Continent Diet (Regular)  AMBULATORY  STATUS COMMUNICATION OF NEEDS Skin   Limited Assist Verbally Normal                       Personal Care Assistance Level of Assistance  Bathing, Feeding, Dressing Bathing Assistance: Limited assistance   Dressing Assistance: Limited assistance     Functional Limitations Info  Sight, Hearing, Speech Sight Info: Impaired (eyeglasses) Hearing Info: Impaired Speech Info: Adequate    SPECIAL CARE FACTORS FREQUENCY  PT (By licensed PT), OT (By licensed OT)     PT Frequency:  (5x week) OT Frequency:  (5x week)            Contractures Contractures Info: Not present    Additional Factors Info  Code Status, Allergies Code Status Info:  (Full) Allergies Info:  (Ambrosia Trifida (Tall Ragweed) Allergy Skin Test, Hydrochlorothiazide, Lyrica (Pregabalin), Diltiazem Hcl Er, Short Ragweed Pollen Ext)           Current Medications (08/18/2022):  This is the current hospital active medication list Current Facility-Administered Medications  Medication Dose Route Frequency Provider Last Rate Last Admin   0.9 %  sodium chloride infusion   Intravenous PRN Eugenie Filler, MD   Stopped at 08/14/22 0601   acetaminophen (TYLENOL) tablet 650 mg  650 mg Oral Q6H PRN Dahal, Marlowe Aschoff, MD       Or   acetaminophen (TYLENOL) suppository 650 mg  650 mg Rectal Q6H PRN Dahal, Marlowe Aschoff, MD       amiodarone (PACERONE) tablet 200 mg  200 mg Oral BID Eugenie Filler, MD   200 mg at 08/18/22 0932   [START ON  08/21/2022] amiodarone (PACERONE) tablet 200 mg  200 mg Oral Daily Eugenie Filler, MD       apixaban Arne Cleveland) tablet 2.5 mg  2.5 mg Oral BID Adrian Saran, RPH   2.5 mg at 08/18/22 0932   atorvastatin (LIPITOR) tablet 40 mg  40 mg Oral QHS Dahal, Marlowe Aschoff, MD   40 mg at 08/17/22 2235   diclofenac Sodium (VOLTAREN) 1 % topical gel 2 g  2 g Topical QID PRN Eugenie Filler, MD       donepezil (ARICEPT) tablet 10 mg  10 mg Oral QHS Dahal, Marlowe Aschoff, MD   10 mg at 08/17/22 2235   DULoxetine  (CYMBALTA) DR capsule 30 mg  30 mg Oral Daily Dahal, Marlowe Aschoff, MD   30 mg at 08/18/22 0932   feeding supplement (ENSURE ENLIVE / ENSURE PLUS) liquid 237 mL  237 mL Oral Q24H Eugenie Filler, MD       gabapentin (NEURONTIN) capsule 100 mg  100 mg Oral QHS Dahal, Marlowe Aschoff, MD   100 mg at 08/17/22 2235   hydrALAZINE (APRESOLINE) injection 10 mg  10 mg Intravenous Q6H PRN Dahal, Marlowe Aschoff, MD       lactose free nutrition (BOOST PLUS) liquid 237 mL  237 mL Oral BID BM Eugenie Filler, MD       levalbuterol Penne Lash) nebulizer solution 0.63 mg  0.63 mg Nebulization Q6H PRN Terrilee Croak, MD       [START ON 08/19/2022] levothyroxine (SYNTHROID) tablet 100 mcg  100 mcg Oral QAC breakfast Eugenie Filler, MD       LORazepam (ATIVAN) injection 0.5 mg  0.5 mg Intravenous Q4H PRN Dahal, Marlowe Aschoff, MD   0.5 mg at 08/15/22 0003   metoprolol tartrate (LOPRESSOR) tablet 75 mg  75 mg Oral BID Jerline Pain, MD   75 mg at 08/18/22 0931   mirtazapine (REMERON) tablet 15 mg  15 mg Oral QHS Dahal, Marlowe Aschoff, MD   15 mg at 08/17/22 2235   mometasone-formoterol (DULERA) 200-5 MCG/ACT inhaler 2 puff  2 puff Inhalation BID Eugenie Filler, MD   2 puff at 08/18/22 0916   nitroGLYCERIN (NITROSTAT) SL tablet 0.4 mg  0.4 mg Sublingual Q5 min PRN Gareth Morgan, MD   0.4 mg at 08/10/22 1612   Oral care mouth rinse  15 mL Mouth Rinse PRN Reubin Milan, MD       oxyCODONE (Oxy IR/ROXICODONE) immediate release tablet 5 mg  5 mg Oral Q4H PRN Dahal, Marlowe Aschoff, MD   5 mg at 08/12/22 2354   pantoprazole (PROTONIX) EC tablet 40 mg  40 mg Oral Daily Dahal, Marlowe Aschoff, MD   40 mg at 08/18/22 0932   phenol (CHLORASEPTIC) mouth spray 1 spray  1 spray Mouth/Throat PRN Eugenie Filler, MD       polyethylene glycol (MIRALAX / GLYCOLAX) packet 17 g  17 g Oral Daily PRN Dahal, Binaya, MD       polyvinyl alcohol (LIQUIFILM TEARS) 1.4 % ophthalmic solution 1 drop  1 drop Both Eyes PRN Eugenie Filler, MD       saccharomyces boulardii  (FLORASTOR) capsule 250 mg  250 mg Oral BID Eugenie Filler, MD   250 mg at 08/18/22 0932   traZODone (DESYREL) tablet 50 mg  50 mg Oral QHS PRN Terrilee Croak, MD   50 mg at 08/17/22 2236   umeclidinium bromide (INCRUSE ELLIPTA) 62.5 MCG/ACT 1 puff  1 puff Inhalation Daily Eugenie Filler, MD   1 puff  at 08/18/22 0916     Discharge Medications: Please see discharge summary for a list of discharge medications.  Relevant Imaging Results:  Relevant Lab Results:   Additional Information  [id number])  Korbyn Chopin, Juliann Pulse, RN

## 2022-08-19 DIAGNOSIS — I4891 Unspecified atrial fibrillation: Secondary | ICD-10-CM | POA: Diagnosis not present

## 2022-08-19 DIAGNOSIS — R531 Weakness: Secondary | ICD-10-CM | POA: Diagnosis not present

## 2022-08-19 DIAGNOSIS — N390 Urinary tract infection, site not specified: Secondary | ICD-10-CM | POA: Diagnosis not present

## 2022-08-19 DIAGNOSIS — I2699 Other pulmonary embolism without acute cor pulmonale: Secondary | ICD-10-CM | POA: Diagnosis not present

## 2022-08-19 DIAGNOSIS — E43 Unspecified severe protein-calorie malnutrition: Secondary | ICD-10-CM

## 2022-08-19 LAB — BASIC METABOLIC PANEL
Anion gap: 10 (ref 5–15)
BUN: 39 mg/dL — ABNORMAL HIGH (ref 8–23)
CO2: 24 mmol/L (ref 22–32)
Calcium: 9.1 mg/dL (ref 8.9–10.3)
Chloride: 106 mmol/L (ref 98–111)
Creatinine, Ser: 1.3 mg/dL — ABNORMAL HIGH (ref 0.44–1.00)
GFR, Estimated: 38 mL/min — ABNORMAL LOW (ref >60)
Glucose, Bld: 101 mg/dL — ABNORMAL HIGH (ref 70–99)
Potassium: 5 mmol/L (ref 3.5–5.1)
Sodium: 140 mmol/L (ref 135–145)

## 2022-08-19 MED ORDER — LEVALBUTEROL HCL 0.63 MG/3ML IN NEBU: 0.6300 mg | INHALATION_SOLUTION | Freq: Four times a day (QID) | RESPIRATORY_TRACT | 12 refills | Status: DC | PRN

## 2022-08-19 MED ORDER — ENSURE ENLIVE PO LIQD: 237.0000 mL | ORAL | 12 refills | Status: DC

## 2022-08-19 MED ORDER — UMECLIDINIUM BROMIDE 62.5 MCG/ACT IN AEPB: 1.0000 | INHALATION_SPRAY | Freq: Every day | RESPIRATORY_TRACT | Status: DC

## 2022-08-19 MED ORDER — METOPROLOL TARTRATE 75 MG PO TABS: 75.0000 mg | ORAL_TABLET | Freq: Two times a day (BID) | ORAL | 1 refills | Status: DC

## 2022-08-19 MED ORDER — MOMETASONE FURO-FORMOTEROL FUM 200-5 MCG/ACT IN AERO: 2.0000 | INHALATION_SPRAY | Freq: Two times a day (BID) | RESPIRATORY_TRACT | Status: DC

## 2022-08-19 MED ORDER — APIXABAN 2.5 MG PO TABS: 2.5000 mg | ORAL_TABLET | Freq: Two times a day (BID) | ORAL | 1 refills | Status: DC

## 2022-08-19 MED ORDER — AMIODARONE HCL 200 MG PO TABS: ORAL_TABLET | ORAL | 1 refills | Status: DC

## 2022-08-19 MED ORDER — ACETAMINOPHEN 325 MG PO TABS: 650.0000 mg | ORAL_TABLET | Freq: Four times a day (QID) | ORAL | Status: DC | PRN

## 2022-08-19 MED ORDER — POLYVINYL ALCOHOL 1.4 % OP SOLN: 1.0000 [drp] | OPHTHALMIC | 0 refills | Status: DC | PRN

## 2022-08-19 MED ORDER — BOOST PLUS PO LIQD: 237.0000 mL | Freq: Two times a day (BID) | ORAL | 0 refills | Status: DC

## 2022-08-19 MED ORDER — LEVOTHYROXINE SODIUM 100 MCG PO TABS: 100.0000 ug | ORAL_TABLET | Freq: Every morning | ORAL | 1 refills | Status: DC

## 2022-08-19 NOTE — Plan of Care (Signed)
  Problem: Activity: Goal: Risk for activity intolerance will decrease Outcome: Progressing   

## 2022-08-19 NOTE — Care Management Important Message (Signed)
Important Message  Patient Details IM Letter placed in Patient's room. Name: Erin Ryan MRN: [id number] Date of Birth: [birth year]   Medicare Important Message Given:  Yes     Kerin Salen 08/19/2022, 12:45 PM

## 2022-08-19 NOTE — Progress Notes (Signed)
RN received report on patient from off-going RN. Will resume patient's care until 08/19/22 at 0700. RN agrees with patient's most recent assessment. Will continue to monitor any changes for the rest of the shift.

## 2022-08-19 NOTE — Progress Notes (Signed)
Mobility Specialist - Progress Note   08/19/22 0950  Mobility  Activity Ambulated with assistance in hallway  Level of Assistance Minimal assist, patient does 75% or more  Assistive Device Front wheel walker  Distance Ambulated (ft) 60 ft  Range of Motion/Exercises Active  Activity Response Tolerated well  Mobility Referral Yes  $Mobility charge 1 Mobility   Pt was found on recliner chair and agreeable to ambulate. Was min-A from sit to stand and contact guard for ambulation in hallway. Once entering the room stated she felt like she had to pee and that her knees where giving out on her. Pt sat on BSC and NT was notified. At EOS was left with NT in room.  Ferd Hibbs Mobility Specialist

## 2022-08-19 NOTE — TOC Progression Note (Signed)
Transition of Care [facility]) - Progression Note    Patient Details  Name: Erin Ryan MRN: [id number] Date of Birth: 1924/11/17  Transition of [facility]) CM/SW Contact  Joaquin Courts, RN Phone Number: 08/19/2022, 2:16 PM  Clinical Narrative:    CM received call from [place] [facility] to follow up on discharge summary.  At this time discharge summary is not in.  Facility reports that it is now to late in the day for them to receive the summary as they will not be able to get the patients medications delivered from their pharmacy.  Facility reports that they will not be able to accept patient for admission today and will need to delay admission until tomorrow 08/20/22.  Facility reports they will need the DC summary tomorrow between 55 am-12pm.  MD and bedside RN made aware via chat message.   Expected Discharge Plan: [place] Barriers to Discharge: No Barriers Identified  Expected Discharge Plan and Services Expected Discharge Plan: [place] In-house Referral: NA Discharge Planning Services: CM Consult   Living arrangements for the past 2 months: Single Family Home                 DME Arranged: N/A DME Agency: NA                   Social Determinants of Health (SDOH) Interventions    Readmission Risk Interventions     No data to display

## 2022-08-19 NOTE — TOC Progression Note (Addendum)
Transition of Care [facility]) - Progression Note    Patient Details  Name: Zeenat Jeanbaptiste MRN: [id number] Date of Birth: 1925/07/10  Transition of Care [facility]) CM/SW Contact  Biagio Snelson, Juliann Pulse, RN Phone Number: 08/19/2022, 11:14 AM  Clinical Narrative:  [facility] accepted & chosen by dtr-must have d/c summary by 1p. PTAR prior 7p  -11:52a-[name](dtr) changed her mind about facility-chose [facility] no bed available today, not sure about tomorrow-will assess in am for bed availability. MD updated.  -12p-spoke to [name](dtr) confirmed chose [facility] aware-need d/c summary by 1:30p,must arrive prior 7p by PTAR.MD updated.    Expected Discharge Plan: [place] Barriers to Discharge: No Barriers Identified  Expected Discharge Plan and Services Expected Discharge Plan: [place] In-house Referral: NA Discharge Planning Services: CM Consult   Living arrangements for the past 2 months: Single Family Home                 DME Arranged: N/A DME Agency: NA                   Social Determinants of Health (SDOH) Interventions    Readmission Risk Interventions     No data to display

## 2022-08-19 NOTE — Progress Notes (Signed)
Physical Therapy Treatment Patient Details Name: Erin Ryan MRN: [id number] DOB: 1925-01-07 Today's Date: 08/19/2022   History of Present Illness Pt admitted from home with AMS and weakness and diagnosed with acute PE, afib with RVR, bil PNA, UTI, AKI on CKD3a, and acute metabolic encephalopathy.  Pt with hx of dementia, htn, CKD and COPD    PT Comments    Pt received OOB in recliner and agreeable to therapy, on RA with minor episode of SOB but otherwise no complaints. Pt required min guard for transfers and ambulation in hallway with RW 48f, distance limited by fatigue. At end of ambulation task attempted to have pt complete BLE exercises but pt reporting too much fatigue. Encouraged pt to complete ankle pumps outside of therapy session, pt verbalized understanding. Discharge destination remains appropriate. We will continue to follow acutely.    Recommendations for follow up therapy are one component of a multi-disciplinary discharge planning process, led by the attending physician.  Recommendations may be updated based on patient status, additional functional criteria and insurance authorization.  Follow Up Recommendations  Home health PT     Assistance Recommended at Discharge Intermittent Supervision/Assistance  Patient can return home with the following A little help with walking and/or transfers;A little help with bathing/dressing/bathroom;Assistance with cooking/housework;Assist for transportation;Help with stairs or ramp for entrance   Equipment Recommendations  None recommended by PT    Recommendations for Other Services       Precautions / Restrictions Precautions Precautions: Fall Precaution Comments: monitor O2 sats Restrictions Weight Bearing Restrictions: No     Mobility  Bed Mobility               General bed mobility comments: Pt in recliner at entry and exit    Transfers Overall transfer level: Needs assistance Equipment used: Rolling walker (2  wheels) Transfers: Sit to/from Stand Sit to Stand: Min guard           General transfer comment: For safety only    Ambulation/Gait Ambulation/Gait assistance: Min guard Gait Distance (Feet): 64 Feet Assistive device: Rolling walker (2 wheels) Gait Pattern/deviations: Step-to pattern, Trunk flexed Gait velocity: decreased     General Gait Details: Pt ambulated with RW and min guard, no physical assist provided or overt LOB noted.   Stairs             Wheelchair Mobility    Modified Rankin (Stroke Patients Only)       Balance Overall balance assessment: Mild deficits observed, not formally tested Sitting-balance support: No upper extremity supported, Feet supported Sitting balance-Leahy Scale: Good     Standing balance support: During functional activity Standing balance-Leahy Scale: Fair                              Cognition Arousal/Alertness: Awake/alert Behavior During Therapy: WFL for tasks assessed/performed Overall Cognitive Status: History of cognitive impairments - at baseline                                 General Comments[facility].        Exercises      General Comments General comments (skin integrity, edema, etc.): Daughter present      Pertinent Vitals/Pain Pain Assessment Pain Assessment: No/denies pain Breathing: normal Negative Vocalization: occasional moan/groan, low speech, negative/disapproving quality Facial Expression: smiling or inexpressive Body Language:  tense, distressed pacing, fidgeting Consolability: no need to console PAINAD Score: 2    Home Living                          Prior Function            PT Goals (current goals can now be found in the care plan section) Acute Rehab PT Goals Patient Stated Goal: HOME PT Goal Formulation: With patient Time For Goal Achievement: 08/28/22 Potential to Achieve Goals: Good Progress towards PT  goals: Progressing toward goals    Frequency    Min 3X/week      PT Plan Current plan remains appropriate    Co-evaluation              AM-PAC PT "6 Clicks" Mobility   Outcome Measure  Help needed turning from your back to your side while in a flat bed without using bedrails?: A Little Help needed moving from lying on your back to sitting on the side of a flat bed without using bedrails?: A Little Help needed moving to and from a bed to a chair (including a wheelchair)?: A Little Help needed standing up from a chair using your arms (e.g., wheelchair or bedside chair)?: A Little Help needed to walk in hospital room?: A Little Help needed climbing 3-5 steps with a railing? : A Little 6 Click Score: 18    End of Session Equipment Utilized During Treatment: Gait belt Activity Tolerance: Patient tolerated treatment well;Patient limited by fatigue Patient left: in chair;with call bell/phone within reach;with chair alarm set;with family/visitor present Nurse Communication: Mobility status PT Visit Diagnosis: Difficulty in walking, not elsewhere classified (R26.2);Unsteadiness on feet (R26.81);Muscle weakness (generalized) (M62.81)     Time: 1884-1660 PT Time Calculation (min) (ACUTE ONLY): 19 min  Charges:  $Gait Training: 8-22 mins                     Coolidge Breeze, PT, DPT [facility] Rehabilitation Department Office: [phone] Weekend pager: [phone]   Coolidge Breeze 08/19/2022, 11:36 AM

## 2022-08-19 NOTE — Discharge Summary (Signed)
Physician Discharge Summary  Erin Ryan:[id number] DOB: 09-24-1924 DOA: 08/10/2022  PCP: Erin Stains, MD  Admit date: 08/10/2022 Discharge date: 08/19/2022  Time spent: 60 minutes  Recommendations for Outpatient Follow-up:  Follow-up with Dr. Candee Ryan, cardiology in 2 weeks for follow-up on A-fib, CHF. Follow-up with MD at SNF.  Patient will need a basic metabolic profile, magnesium level checked in 1 week.  Patient will need repeat thyroid function studies done in approximately 4 weeks.   Discharge Diagnoses:  Principal Problem:   Acute pulmonary embolism ([place]) Active Problems:   Essential hypertension   GERD   Chronic kidney disease (CKD), stage III (moderate) (HCC)   Adult hypothyroidism   Moderate dementia without behavioral disturbance, psychotic disturbance, mood disturbance, or anxiety (HCC)   Protein-calorie malnutrition, severe   Enterococcus UTI   Atrial fibrillation with RVR (HCC)   Generalized weakness   Urinary tract infection without hematuria   Discharge Condition: Stable and improved.  Diet recommendation: Regular  Filed Weights   08/17/22 0431 08/18/22 0414 08/19/22 0500  Weight: 58.6 kg 58 kg 57.2 kg    History of present illness:  HPI per Dr. Simonne Maffucci Ryan is a 88 y.o. female with PMH significant for dementia, HTN, HLD, CKD, COPD, GERD, hypothyroidism, hearing loss, anxiety, arthritis, compression fracture.   This morning, patient was brought to the ED at [facility] by her daughter with confusion, concerning for UTI.  Per daughter, patient had some urinary symptoms last week but was improved on its own.  This morning, patient was weak, acting confused.  No fever.   In the ED, patient was afebrile, initially heart rate in 90s, blood pressure 130/53, breathing on room air  While in the ED, she started having chest pain.  She was noted to be in A-fib with RVR with heart rate up to 160 bpm.  She was given a dose of IV metoprolol  5 mg with conversion to normal sinus rhythm with heart rate in 70s Labs showed WBC count 12.5, hemoglobin 9.8, platelet 225, sodium 135, potassium 4, BUN/creatinine 51/1.22, glucose 139, BNP elevated 524, troponin normal, lactic acid level normal Respite virus panel negative for COVID, flu Urinalysis showed cloudy yellow urine with trace hemoglobin, large leukocytes, negative nitrate, few bacteria CT angio chest showed an acute PE with small thrombus burden in the subsegmental branch of the left lower lobe with RV strain.  It also showed small patchy densities in both mid and both lower lung fields suggesting atelectasis/pneumonia, minimal bilateral pleural effusion R>L. CT head with no acute findings. Patient was started on heparin drip.  Also empirically started on IV Rocephin. Accepted to [facility] service for inpatient admission and management.   At the time of my evaluation, patient was lying down in bed.  On low-flow oxygen for comfort.  Extremely hard of hearing.  Not in any distress.  She was able to give some details of the history. Shortly before my eval, patient is back in A-fib with RVR again up to 140s without any symptoms.  No family at bedside. Called and discussed with patient's daughter Erin Ryan on the phone   Hospital Course:  #1 acute pulmonary emboli -CT angiogram chest showed acute PE with small thrombus burden subsegmental branch of the left lower lobe with RV strain as well. -Lower extremity Dopplers done negative for DVT. -2D echo done with EF of 55 to 60%, NWMA, mild asymmetric LVH of the basal septal segment, grade 2 diastolic dysfunction, low normal right  ventricular systolic function, right ventricular size mildly enlarged, mildly elevated pulmonary artery systolic pressure, mildly dilated right atrial size.  Mild MVR.  Mild mitral stenosis.  Moderate mitral annular calcification.  Mild thickening of aortic valve.  Moderate AVR. -Was on heparin drip and transitioned to Eliquis as  patient had improved clinically with decreased O2 requirements.   -Outpatient follow-up.    2.  Acute respiratory failure with hypoxia -Multifactorial secondary to pneumonia, PE, A-fib with RVR, possible volume overload. -Patient with increasing O2 requirements on 6 L nasal cannula on 08/13/2022. -IV fluids discontinued. -Repeat chest x-ray (08/13/2022) with significant increased bilateral lung opacities noted concerning for worsening edema with mild bilateral pleural effusions. -Patient with decreased breath sounds noted on examination which have improved.. -Volume overload likely secondary to A-fib with RVR. -Hypoxia improved currently sats of 95-99% on room air by day of discharge.. -Status post Lasix 20 mg IV every 12 hours x 2 days with good urine output.  -IV Lasix have been discontinued. -Was on IV antibiotics and transitioned to oral antibiotics and completed a full course of antibiotic treatment for pneumonia.   -Was on IV heparin and transitioned to Eliquis.  -Was on amiodarone drip and transitioned to oral amiodarone per cardiology. -Patient improved clinically and was back to baseline by day of discharge.  Outpatient follow-up.   3.  A-fib with RVR -Patient with no prior history of A-fib in the past. -Noted initially on admission in the ED, responded with IV metoprolol and placed on oral metoprolol. -Patient noted to have RVR recur and unable to place on Cardizem due to significant allergy, status post 1 dose IV digoxin and patient subsequently placed on amiodarone drip. -Cardiology consulted and patient transitioned from amiodarone drip to oral amiodarone on 08/13/2022, per cardiology. -Cardiology recommended amiodarone 200 mg twice daily x 1 week and then 200 mg daily thereafter. -Patient noted the morning of 08/14/2022, to transiently go into A-fib with RVR while transferring to commode and went back into normal sinus rhythm while cardiology was assessing patient. -Was on IV  heparin and transitioned to Eliquis as patient also noted to have PE.  -Cardiology recommended low-dose Eliquis 2.5 mg twice daily due to patient's advanced age, renal function rather than the typical apixaban PE dosing. -Patient transitioned to Eliquis per pharmacy.  -Outpatient follow-up with cardiology.   4.  Hyperthyroidism/history of hypothyroidism -Per family patient noted to have thyroid removal surgery 5 decades ago noted to be on Synthroid since then and Synthroid recently at 125 mcg's daily. -Unsure of last time Synthroid was adjusted. -TFTs obtained 08/11/2022 with a TSH significantly suppressed to < 0.01, free T4 elevated to 2.04. -Synthroid held for most of the hospitalization and subsequently resumed at a decreased dose of 100 mcg daily on day of discharge.  -Patient will need outpatient follow-up with repeat TFTs done in 4 weeks.   5.  Mild bilateral pneumonia -CT chest done with small patchy densities in both mid and lower lung lobes suggestive of atelectasis/pneumonia, minimal bilateral pleural effusion R > L. -Patient remained afebrile.  Leukocytosis trended down and had resolved by day of discharge.   -Patient initially was on IV Unasyn, subsequently transition to Augmentin and completed a full course of antibiotic treatment.   -No further antibiotics needed on discharge.   6.  Enterococcus faecalis UTI -Was on IV Unasyn and transitioned to Augmentin and completed a course of antibiotic treatment during the hospitalization.   -No further antibiotics needed on discharge.  7.  Acute metabolic encephalopathy/vascular dementia/anxiety -Presenting complaints with confusion likely multifactorial secondary to UTI, pneumonia in the setting of dementia. -Mental status improved daily during the hospitalization and was back to baseline by day of discharge.   -Patient completed course of antibiotics, maintained on home regimen Aricept, Cymbalta, Neurontin and Remeron.   -Outpatient  follow-up.    8.  AKI on CKD stage IIIa. -Baseline creatinine < 1. -Creatinine initially noted to be slowly trending up as high as 1.36. -IV fluids discontinued creatinine down to 1.15 -Patient started on IV Lasix due to concerns for volume overload, renal function trended down to 1.12, and seems to be stabilizing at 1.30 by day of discharge. -Outpatient follow-up.   9.  Acute on chronic diastolic CHF -Patient with increasing respiratory hypoxia and noted to be on 6 L nasal cannula oxygen 08/13/2022. -Acute CHF exacerbation likely triggered by A-fib with RVR. -Chest x-ray done, 08/13/2022 with significant increased bilateral lung opacities noted concerning for worsening edema with mild bilateral pleural effusion. -2D echo with EF of 55 to 27%, grade 2 diastolic dysfunction, mildly enlarged RV and mildly elevated PASP. -Patient noted to have been on Norvasc, metoprolol, Lasix prior to admission. -A-fib rate improved on amiodarone and patient noted to have converted to sinus rhythm, transiently converted back to A-fib with RVR early morning of 08/14/2022,while being transferred to commode per cardiology. -Patient subsequently went back into normal sinus rhythm. -BNP noted to be elevated at 523.6 on 08/10/2022. -Status post Lasix 20 mg IV every 12 hours with good urine output.  -Currently on room air with sats of 99%. -Patient being followed by cardiology who recommended holding any further diuresis at this time and on discharge able to go back to home regimen of 20 mg of furosemide every other day. -Outpatient follow-up with cardiology.   10.  Hypertension -Patient noted to have been on amlodipine 2.5 mg daily, metoprolol 25 mg twice daily, Lasix 20 mg qod prior to admission. -Amlodipine and Lasix held since admission. -Patient maintained on metoprolol and dose uptitrated per cardiology. -Status post IV Lasix every 12 hours for volume overload with good urine output.   -IV Lasix  discontinued.   -BP stable.   -Resume home regimen Lasix on discharge per cardiology recommendations.   11.  Hyperlipidemia -Noted to have been on aspirin and statin prior to admission. -Patient maintained on statin.  Aspirin discontinued as patient started on Eliquis.   -Outpatient follow-up.   12.  COPD -Patient changed from albuterol to Xopenex nebs due to A-fib with RVR. -Patient also placed on Dulera, Incruse.   -Outpatient follow-up.     13.  GERD -Patient maintained on PPI.   14.  Impaired mobility -Continue PT/OT.- -Skilled nursing facility recommended.     15.  Severe protein calorie malnutrition -Patient placed on nutritional supplementation.     16.  Oropharyngeal ulcer on hard palate -Per daughter was not there 3 months ago. -Outpatient follow-up with oral surgeon/dentist.   17.  Urinary retention -Patient noted earlier on the hospitalization to have urinary retention, Foley catheter placed. -Foley catheter discontinued 08/16/2022, patient noted to good urine output per RN.  Bladder scan which was checked showed 0 cc.  -Outpatient follow-up.        Procedures: CT angiogram chest 08/10/2022 CT head 08/10/2022 Chest x-ray 08/10/2022, 08/13/2022 2D echo 08/11/2022 Lower extremity Dopplers 08/11/2022  Consultations: Cardiology: Dr. Marlou Porch 08/12/2022   Discharge Exam: Vitals:   08/19/22 0849 08/19/22 1335  BP:  Marland Kitchen)  147/47  Pulse:  (!) 57  Resp:  18  Temp:  (!) 97.5 F (36.4 C)  SpO2: 95% 99%    General: NAD Cardiovascular: Regular rate rhythm no murmurs rubs or gallops.  No JVD.  No lower extremity edema. Respiratory: Some decreased breath sounds in the bases otherwise clear.  No wheezing, no crackles, no rhonchi.  Fair air movement.  Speaking in full sentences.  Discharge Instructions   Discharge Instructions     Diet general   Complete by: As directed    Increase activity slowly   Complete by: As directed       Allergies as of  08/19/2022       Reactions   Ambrosia Trifida (tall Ragweed) Allergy Skin Test Other (See Comments)   Other reaction(s): Other   Hydrochlorothiazide    hyponatremia   Lyrica [pregabalin] Other (See Comments)   Severe confusion   Diltiazem Hcl Er Rash   Short Ragweed Pollen Ext    Other reaction(s): Other        Medication List     STOP taking these medications    amLODipine 2.5 MG tablet Commonly known as: NORVASC   aspirin 81 MG tablet       TAKE these medications    acetaminophen 325 MG tablet Commonly known as: TYLENOL Take 2 tablets (650 mg total) by mouth every 6 (six) hours as needed for mild pain (or Fever >/= 101).   amiodarone 200 MG tablet Commonly known as: PACERONE Take 1 tablet (200 mg total) by mouth 2 (two) times daily for 2 days, THEN 1 tablet (200 mg total) daily. Start taking on: August 19, 2022   apixaban 2.5 MG Tabs tablet Commonly known as: ELIQUIS Take 1 tablet (2.5 mg total) by mouth 2 (two) times daily.   atorvastatin 40 MG tablet Commonly known as: LIPITOR Take 40 mg by mouth daily.   CALCIUM CITRATE + PO Take 1 tablet 2 (two) times daily by mouth.   CENTRUM SILVER PO Take 1 tablet by mouth daily.   donepezil 10 MG tablet Commonly known as: ARICEPT Take 1 tablet (10 mg total) by mouth at bedtime.   DULoxetine 30 MG capsule Commonly known as: CYMBALTA Take 30 mg by mouth daily.   furosemide 20 MG tablet Commonly known as: LASIX Take 1 tablet (20 mg total) by mouth every other day.   gabapentin 100 MG capsule Commonly known as: NEURONTIN Take 100 mg by mouth at bedtime.   lactose free nutrition Liqd Take 237 mLs by mouth 2 (two) times daily between meals.   feeding supplement Liqd Take 237 mLs by mouth daily.   levalbuterol 0.63 MG/3ML nebulizer solution Commonly known as: XOPENEX Take 3 mLs (0.63 mg total) by nebulization every 6 (six) hours as needed for wheezing or shortness of breath.   levothyroxine 100 MCG  tablet Commonly known as: SYNTHROID Take 1 tablet (100 mcg total) by mouth in the morning. DAW1 Synthroid What changed:  medication strength how much to take   Metoprolol Tartrate 75 MG Tabs Take 75 mg by mouth 2 (two) times daily. What changed:  medication strength how much to take   mirtazapine 15 MG tablet Commonly known as: REMERON Take 15 mg by mouth at bedtime.   mometasone-formoterol 200-5 MCG/ACT Aero Commonly known as: DULERA Inhale 2 puffs into the lungs 2 (two) times daily.   omeprazole 40 MG capsule Commonly known as: PRILOSEC Take 1 capsule (40 mg total) by mouth 2 (two) times daily  before a meal. What changed: when to take this   polyvinyl alcohol 1.4 % ophthalmic solution Commonly known as: LIQUIFILM TEARS Place 1 drop into both eyes as needed for dry eyes.   Prolia 60 MG/ML Sosy injection Generic drug: denosumab Inject 60 mg into the skin every 6 (six) months.   umeclidinium bromide 62.5 MCG/ACT Aepb Commonly known as: INCRUSE ELLIPTA Inhale 1 puff into the lungs daily. Start taking on: August 20, 2022   VOLTAREN EX Apply 1 Application topically as needed (pain).       Allergies  Allergen Reactions   Ambrosia Trifida (Tall Ragweed) Allergy Skin Test Other (See Comments)    Other reaction(s): Other   Hydrochlorothiazide     hyponatremia   Lyrica [Pregabalin] Other (See Comments)    Severe confusion   Diltiazem Hcl Er Rash   Short Ragweed Pollen Ext     Other reaction(s): Other    Follow-up Information     MD at SNF Follow up.          Jerline Pain, MD. Schedule an appointment as soon as possible for a visit in 2 week(s).   Specialty: Cardiology Contact information: [address] [place] [address] [phone]                  The results of significant diagnostics from this hospitalization (including imaging, microbiology, ancillary and laboratory) are listed below for reference.    Significant  Diagnostic Studies: DG CHEST PORT 1 VIEW  Result Date: 08/13/2022 CLINICAL DATA:  Hypoxia. EXAM: PORTABLE CHEST 1 VIEW COMPARISON:  August 10, 2022. FINDINGS: Stable cardiomediastinal silhouette. Significant increased bilateral lung opacities are noted concerning for worsening edema with mild bilateral pleural effusions. Bony thorax is unremarkable. IMPRESSION: Significant increased bilateral lung opacities are noted concerning for worsening edema with mild bilateral pleural effusions. Electronically Signed   By: Marijo Conception M.D.   On: 08/13/2022 08:48   VAS [place] LOWER EXTREMITY VENOUS (DVT)  Result Date: 08/11/2022  Lower Venous DVT Study Patient Name:  Erin Ryan  Date of Exam:   08/11/2022 Medical Rec #: [id number]      Accession #:    [id number] Date of Birth: [birth year]       Patient Gender: F Patient Age:   84 years Exam Location:  [facility] Procedure:      VAS [place] LOWER EXTREMITY VENOUS (DVT) Referring Phys: Terrilee Croak --------------------------------------------------------------------------------  Indications: Pulmonary embolism.  Risk Factors: None identified. Comparison Study: No prior studies. Performing Technologist: Oliver Hum RVT  Examination Guidelines: A complete evaluation includes B-mode imaging, spectral Doppler, color Doppler, and power Doppler as needed of all accessible portions of each vessel. Bilateral testing is considered an integral part of a complete examination. Limited examinations for reoccurring indications may be performed as noted. The reflux portion of the exam is performed with the patient in reverse Trendelenburg.  +---------+---------------+---------+-----------+----------+--------------+ RIGHT    CompressibilityPhasicitySpontaneityPropertiesThrombus Aging +---------+---------------+---------+-----------+----------+--------------+ CFV      Full           Yes      Yes                                  +---------+---------------+---------+-----------+----------+--------------+ SFJ      Full                                                        +---------+---------------+---------+-----------+----------+--------------+  FV Prox  Full                                                        +---------+---------------+---------+-----------+----------+--------------+ FV Mid   Full                                                        +---------+---------------+---------+-----------+----------+--------------+ FV DistalFull                                                        +---------+---------------+---------+-----------+----------+--------------+ PFV      Full                                                        +---------+---------------+---------+-----------+----------+--------------+ POP      Full           Yes      Yes                                 +---------+---------------+---------+-----------+----------+--------------+ PTV      Full                                                        +---------+---------------+---------+-----------+----------+--------------+ PERO     Full                                                        +---------+---------------+---------+-----------+----------+--------------+   +---------+---------------+---------+-----------+----------+--------------+ LEFT     CompressibilityPhasicitySpontaneityPropertiesThrombus Aging +---------+---------------+---------+-----------+----------+--------------+ CFV      Full           Yes      Yes                                 +---------+---------------+---------+-----------+----------+--------------+ SFJ      Full                                                        +---------+---------------+---------+-----------+----------+--------------+ FV Prox  Full                                                         +---------+---------------+---------+-----------+----------+--------------+  FV Mid   Full                                                        +---------+---------------+---------+-----------+----------+--------------+ FV DistalFull           Yes      Yes                                 +---------+---------------+---------+-----------+----------+--------------+ PFV      Full                                                        +---------+---------------+---------+-----------+----------+--------------+ POP      Full           Yes      Yes                                 +---------+---------------+---------+-----------+----------+--------------+ PTV      Full                                                        +---------+---------------+---------+-----------+----------+--------------+ PERO     Full                                                        +---------+---------------+---------+-----------+----------+--------------+     Summary: RIGHT: - There is no evidence of deep vein thrombosis in the lower extremity. However, portions of this examination were limited- see technologist comments above.  - No cystic structure found in the popliteal fossa.  LEFT: - There is no evidence of deep vein thrombosis in the lower extremity. However, portions of this examination were limited- see technologist comments above.  - No cystic structure found in the popliteal fossa.  *See table(s) above for measurements and observations. Electronically signed by Servando Snare MD on 08/11/2022 at 5:30:34 PM.    Final    ECHOCARDIOGRAM COMPLETE  Result Date: 08/11/2022    ECHOCARDIOGRAM REPORT   Patient Name:   KENNEDE Ryan Date of Exam: 08/11/2022 Medical Rec #:  [id number]     Height:       62.0 in Accession #:    [id number]    Weight:       127.4 lb Date of Birth:  1924/09/30      BSA:          1.578 m Patient Age:    89 years      BP:           133/44 mmHg Patient Gender: F              HR:           89 bpm. Exam Location:  Inpatient Procedure:  2D Echo, 3D Echo, Cardiac Doppler and Color Doppler Indications:    Abnormal ECG 794.31 / R94.31  History:        Patient has prior history of Echocardiogram examinations, most                 recent 03/01/2021. COPD; Risk Factors:Hypertension, Dyslipidemia                 and Former Smoker. Choronic kidney disease. Acute pulmonary                 embolism. History of A-fib with RVR. Mild bilateral pneumonia.  Sonographer:    Darlina Sicilian RDCS Referring Phys: [id number] [place]  1. Left ventricular ejection fraction, by estimation, is 55 to 60%. The left ventricle has normal function. The left ventricle has no regional wall motion abnormalities. There is mild asymmetric left ventricular hypertrophy of the basal-septal segment. Left ventricular diastolic parameters are consistent with Grade II diastolic dysfunction (pseudonormalization). Elevated left ventricular end-diastolic pressure.  2. Right ventricular systolic function is low normal. The right ventricular size is mildly enlarged. There is mildly elevated pulmonary artery systolic pressure.  3. Right atrial size was mildly dilated.  4. The mitral valve is grossly normal. Mild mitral valve regurgitation. Mild mitral stenosis. Moderate mitral annular calcification.  5. The aortic valve is tricuspid. There is mild thickening of the aortic valve. Aortic valve regurgitation is moderate. Aortic valve sclerosis is present, with no evidence of aortic valve stenosis. FINDINGS  Left Ventricle: Left ventricular ejection fraction, by estimation, is 55 to 60%. The left ventricle has normal function. The left ventricle has no regional wall motion abnormalities. The left ventricular internal cavity size was normal in size. There is  mild asymmetric left ventricular hypertrophy of the basal-septal segment. Left ventricular diastolic parameters are consistent with Grade II diastolic dysfunction  (pseudonormalization). Elevated left ventricular end-diastolic pressure. Right Ventricle: The right ventricular size is mildly enlarged. No increase in right ventricular wall thickness. Right ventricular systolic function is low normal. There is mildly elevated pulmonary artery systolic pressure. The tricuspid regurgitant velocity is 2.91 m/s, and with an assumed right atrial pressure of 3 mmHg, the estimated right ventricular systolic pressure is 15.1 mmHg. Left Atrium: Left atrial size was normal in size. Right Atrium: Right atrial size was mildly dilated. Pericardium: Trivial pericardial effusion is present. The pericardial effusion is posterior to the left ventricle. Mitral Valve: The mitral valve is grossly normal. There is mild thickening of the mitral valve leaflet(s). Moderate mitral annular calcification located on the posterior annulus. Mild mitral valve regurgitation. Mild mitral valve stenosis. MV peak gradient, 7.3 mmHg. The mean mitral valve gradient is 4.0 mmHg with average heart rate of 98 bpm. Tricuspid Valve: The tricuspid valve is grossly normal. Tricuspid valve regurgitation is mild . No evidence of tricuspid stenosis. Aortic Valve: The aortic valve is tricuspid. There is mild thickening of the aortic valve. There is mild aortic valve annular calcification. Aortic valve regurgitation is moderate. Aortic valve sclerosis is present, with no evidence of aortic valve stenosis. Pulmonic Valve: The pulmonic valve was grossly normal. Pulmonic valve regurgitation is not visualized. No evidence of pulmonic stenosis. Aorta: The aortic root is normal in size and structure. IAS/Shunts: The atrial septum is grossly normal.  LEFT VENTRICLE PLAX 2D LVIDd:         3.30 cm   Diastology LVIDs:         1.60 cm   LV e' medial:  5.87 cm/s LV PW:         0.80 cm   LV E/e' medial:  26.7 LV IVS:        0.90 cm   LV e' lateral:   7.07 cm/s LVOT diam:     2.00 cm   LV E/e' lateral: 22.2 LV SV:         40 LV SV Index:    25 LVOT Area:     3.14 cm                           3D Volume EF:                          3D EF:        58 %                          LV EDV:       75 ml                          LV ESV:       32 ml                          LV SV:        43 ml RIGHT VENTRICLE RV Basal diam:  3.20 cm RV Mid diam:    2.40 cm RV S prime:     11.40 cm/s TAPSE (M-mode): 1.3 cm LEFT ATRIUM             Index        RIGHT ATRIUM           Index LA diam:        3.40 cm 2.15 cm/m   RA Area:     11.10 cm LA Vol (A2C):   32.1 ml 20.34 ml/m  RA Volume:   20.50 ml  12.99 ml/m LA Vol (A4C):   44.5 ml 28.19 ml/m LA Biplane Vol: 39.2 ml 24.84 ml/m  AORTIC VALVE LVOT Vmax:   66.17 cm/s LVOT Vmean:  41.962 cm/s LVOT VTI:    0.127 m  AORTA Ao Root diam: 3.10 cm Ao Asc diam:  2.80 cm MITRAL VALVE                TRICUSPID VALVE MV Area (PHT): 3.46 cm     TR Peak grad:   33.9 mmHg MV Peak grad:  7.3 mmHg     TR Vmax:        291.00 cm/s MV Mean grad:  4.0 mmHg MV Vmax:       1.35 m/s     SHUNTS MV Vmean:      98.4 cm/s    Systemic VTI:  0.13 m MV Decel Time: 219 msec     Systemic Diam: 2.00 cm MV E velocity: 157.00 cm/s MV A velocity: 119.00 cm/s MV E/A ratio:  1.32 Skeet Latch MD Electronically signed by Skeet Latch MD Signature Date/Time: 08/11/2022/9:03:26 AM    Final    CT Angio Chest PE W and/or Wo Contrast  Result Date: 08/10/2022 CLINICAL DATA:  High clinical suspicion for PE EXAM: CT ANGIOGRAPHY CHEST WITH CONTRAST TECHNIQUE: Multidetector CT imaging of the chest was performed using the standard protocol during bolus administration of intravenous contrast. Multiplanar CT image reconstructions and  MIPs were obtained to evaluate the vascular anatomy. RADIATION DOSE REDUCTION: This exam was performed according to the departmental dose-optimization program which includes automated exposure control, adjustment of the mA and/or kV according to patient size and/or use of iterative reconstruction technique. CONTRAST:  2m OMNIPAQUE  IOHEXOL 350 MG/ML SOLN COMPARISON:  CT done on 03/01/2022, chest radiograph done on 08/10/2022 FINDINGS: Cardiovascular: There is homogeneous enhancement in thoracic aorta. Scattered calcifications are seen in thoracic aorta and its major branches. Coronary artery calcifications are seen. Intraluminal filling defect is seen in a subsegmental pulmonary artery branch in posterior left lower lung field. RV LV ratio is 1.4. Small pocket of air is seen in right subclavian vein, possibly introduced during venipuncture. Mediastinum/Nodes: No significant lymphadenopathy is seen in mediastinum. There is gaseous distention of thoracic esophagus. Lungs/Pleura: Small patchy infiltrates are seen in right middle lobe, left upper lobe and both lower lobes. There is mild thickening of interlobular septi. Minimal bilateral pleural effusions are seen, more so on the right side. Upper Abdomen: There is fatty infiltration in liver. There is 2.6 cm cyst in the left lobe of liver. Small hiatal hernia is seen. Left kidney is smaller than usual. This may be a congenital variation or suggest left renal artery stenosis. Musculoskeletal: No acute findings are seen. Review of the MIP images confirms the above findings. IMPRESSION: There is acute PE with small thrombus burden in subsegmental branch in left lower lobe. There is right ventricular strain which may be chronic. RV LV ratio is 1.4. Aortic atherosclerosis.  Coronary artery calcifications are seen. There are small patchy densities in both mid and both lower lung fields suggesting atelectasis/pneumonia. Minimal bilateral pleural effusions, more so on the right side. There is mild thickening of interlobular septi suggesting possible minimal interstitial edema. Imaging finding of pulmonary embolism was relayed to patient's provider Dr. SBilly Fischerby telephone call. Fatty liver.  Left hepatic cyst.  Left kidney is smaller than right. Electronically Signed   By: PElmer PickerM.D.    On: 08/10/2022 13:32   CT Head Wo Contrast  Result Date: 08/10/2022 CLINICAL DATA:  Barium EXAM: CT HEAD WITHOUT CONTRAST TECHNIQUE: Contiguous axial images were obtained from the base of the skull through the vertex without intravenous contrast. RADIATION DOSE REDUCTION: This exam was performed according to the departmental dose-optimization program which includes automated exposure control, adjustment of the mA and/or kV according to patient size and/or use of iterative reconstruction technique. COMPARISON:  CT Head 03/01/22. FINDINGS: Brain: No evidence of acute infarction, hemorrhage, hydrocephalus, extra-axial collection or mass lesion/mass effect. Sequela of moderate chronic microvascular ischemic change. Vascular: No hyperdense vessel or unexpected calcification. Skull: Normal. Negative for fracture or focal lesion. Sinuses/Orbits: Bilateral lens replacements. Bilateral staphyloma is. Mild mucosal thickening left maxillary sinus. Other: None. IMPRESSION: No CT finding to explain altered mental status. Electronically Signed   By: HMarin RobertsM.D.   On: 08/10/2022 11:42   DG Chest Portable 1 View  Result Date: 08/10/2022 CLINICAL DATA:  Shortness of breath EXAM: PORTABLE CHEST 1 VIEW COMPARISON:  06/25/2017 FINDINGS: There is poor inspiration. Cardiac size is within normal limits. There are no signs of alveolar pulmonary edema or focal pulmonary consolidation. Left lateral costophrenic angle is indistinct. IMPRESSION: There are no signs of alveolar pulmonary edema or focal pulmonary consolidation. Left lateral CP angle is indistinct which may be due to poor inspiration or suggest minimal effusion. Electronically Signed   By: PElmer PickerM.D.   On: 08/10/2022 11:39  Microbiology: Recent Results (from the past 240 hour(s))  Resp Panel by RT-PCR (Flu A&B, Covid)     Status: None   Collection Time: 08/10/22 11:53 AM   Specimen: Nasal Swab  Result Value Ref Range Status   SARS  Coronavirus 2 by RT PCR NEGATIVE NEGATIVE Final    Comment: (NOTE) SARS-CoV-2 target nucleic acids are NOT DETECTED.  The SARS-CoV-2 RNA is generally detectable in upper respiratory specimens during the acute phase of infection. The lowest concentration of SARS-CoV-2 viral copies this assay can detect is 138 copies/mL. A negative result does not preclude SARS-Cov-2 infection and should not be used as the sole basis for treatment or other patient management decisions. A negative result may occur with  improper specimen collection/handling, submission of specimen other than nasopharyngeal swab, presence of viral mutation(s) within the areas targeted by this assay, and inadequate number of viral copies(<138 copies/mL). A negative result must be combined with clinical observations, patient history, and epidemiological information. The expected result is Negative.  Fact Sheet for Patients:  EntrepreneurPulse.com.au  Fact Sheet for Healthcare Providers:  IncredibleEmployment.be  This test is no t yet approved or cleared by the [place] FDA and  has been authorized for detection and/or diagnosis of SARS-CoV-2 by FDA under an Emergency Use Authorization (EUA). This EUA will remain  in effect (meaning this test can be used) for the duration of the COVID-19 declaration under Section 564(b)(1) of the Act, 21 U.S.C.section 360bbb-3(b)(1), unless the authorization is terminated  or revoked sooner.       Influenza A by PCR NEGATIVE NEGATIVE Final   Influenza B by PCR NEGATIVE NEGATIVE Final    Comment: (NOTE) The Xpert Xpress SARS-CoV-2/FLU/RSV plus assay is intended as an aid in the diagnosis of influenza from Nasopharyngeal swab specimens and should not be used as a sole basis for treatment. Nasal washings and aspirates are unacceptable for Xpert Xpress SARS-CoV-2/FLU/RSV testing.  Fact Sheet for  Patients: EntrepreneurPulse.com.au  Fact Sheet for Healthcare Providers: IncredibleEmployment.be  This test is not yet approved or cleared by the [place] FDA and has been authorized for detection and/or diagnosis of SARS-CoV-2 by FDA under an Emergency Use Authorization (EUA). This EUA will remain in effect (meaning this test can be used) for the duration of the COVID-19 declaration under Section 564(b)(1) of the Act, 21 U.S.C. section 360bbb-3(b)(1), unless the authorization is terminated or revoked.  Performed at [facility], [address] [place], [place] [address]   Urine Culture     Status: Abnormal   Collection Time: 08/10/22 11:54 AM   Specimen: Urine, Clean Catch  Result Value Ref Range Status   Specimen Description   Final    URINE, CLEAN CATCH Performed at [facility], [address] [place], [place] [address]    Special Requests   Final    NONE Performed at [facility], [address] [place], [place] [address]    Culture >=100,000 COLONIES/mL ENTEROCOCCUS FAECALIS (A)  Final   Report Status 08/12/2022 FINAL  Final   Organism ID, Bacteria ENTEROCOCCUS FAECALIS (A)  Final      Susceptibility   Enterococcus faecalis - MIC*    AMPICILLIN <=2 SENSITIVE Sensitive     NITROFURANTOIN <=16 SENSITIVE Sensitive     VANCOMYCIN 1 SENSITIVE Sensitive     * >=100,000 COLONIES/mL ENTEROCOCCUS FAECALIS  MRSA Next Gen by PCR, Nasal     Status: None   Collection Time: 08/10/22  5:52 PM   Specimen: Nasal Mucosa; Nasal Swab  Result Value Ref Range Status   MRSA by PCR Next Gen NOT DETECTED NOT DETECTED Final    Comment: (NOTE) The GeneXpert MRSA Assay (FDA approved for NASAL specimens only), is one component of a comprehensive MRSA colonization surveillance program. It is not intended to diagnose MRSA infection nor to guide or monitor treatment for MRSA infections. Test performance is not FDA  approved in patients less than 55 years old. Performed at [facility], [address], [place], [place] [address]      Labs: Basic Metabolic Panel: Recent Labs  Lab 08/13/22 0810 08/14/22 0319 08/15/22 0307 08/16/22 0551 08/17/22 0524 08/18/22 0418 08/19/22 0449  NA  --    < > 139 138 139 138 140  K  --    < > 4.2 5.0 5.0 5.0 5.0  CL  --    < > 104 103 104 105 106  CO2  --    < > '25 25 25 24 24  '$ GLUCOSE  --    < > 102* 94 98 112* 101*  BUN  --    < > 46* 46* 47* 42* 39*  CREATININE  --    < > 1.29* 1.21* 1.22* 1.25* 1.30*  CALCIUM  --    < > 8.4* 9.0 9.0 9.4 9.1  MG 1.9  --  2.0 2.2  --   --   --    < > = values in this interval not displayed.   Liver Function Tests: No results for input(s): "AST", "ALT", "ALKPHOS", "BILITOT", "PROT", "ALBUMIN" in the last 168 hours. No results for input(s): "LIPASE", "AMYLASE" in the last 168 hours. No results for input(s): "AMMONIA" in the last 168 hours. CBC: Recent Labs  Lab 08/13/22 0318 08/14/22 0319 08/15/22 0307 08/16/22 0551  WBC 11.0* 7.8 8.4 8.6  NEUTROABS 7.9* 5.3  --   --   HGB 10.9* 10.7* 11.0* 11.4*  HCT 35.6* 34.2* 35.0* 36.7  MCV 93.4 90.7 89.7 90.6  PLT 252 269 301 320   Cardiac Enzymes: No results for input(s): "CKTOTAL", "CKMB", "CKMBINDEX", "TROPONINI" in the last 168 hours. BNP: BNP (last 3 results) Recent Labs    08/10/22 1103  BNP 523.6*    ProBNP (last 3 results) No results for input(s): "PROBNP" in the last 8760 hours.  CBG: No results for input(s): "GLUCAP" in the last 168 hours.     Signed:  Irine Seal MD.  [facility] 08/19/2022, 2:48 PM

## 2022-08-19 NOTE — Progress Notes (Addendum)
Speech Language Pathology Treatment: Dysphagia  Patient Details Name: Erin Ryan MRN: [id number] DOB: 03-27-1925 Today's Date: 08/19/2022 Time: 3818-4037 SLP Time Calculation (min) (ACUTE ONLY): 17 min  Assessment / Plan / Recommendation Clinical Impression  Pt seen at bedside for follow up after BSE completed 08/11/22. RN reports pt does well with regular diet, and is beginning to choose items she likes, to facilitate sufficient PO intake. She also indicated pt's intake is highly variable throughout the day. Pt is currently afebrile, and lungs are diminished, otherwise clear. Pt was observed during lunch with regular items, puree, and thin liquid.  She did not exhibit coughing, but did clear her throat x1. Poor intake this meal. SLP will sign off at this time. Please reconsult if needs arise.    HPI HPI: Patient is a 88 y.o. female with PMH: dementia, HTN, HLD, CKD, COPD, GERD, hearing loss, anxiety, arthritis, compression fracture who lives at home with her daughter. She presented to ED at [facility] on [facility]/10[facility]22 with confusion and concern for UTI as well as daughter reporting patient to have several weeks of progressively worsening dyspnea on exertion. In ED, patient was afebrile, on RA, BP 130/53. She started to have chest pain and was noted to be in a-fib. CT angio chest showed an acute PE with small thrombus burden in the subsegmental branch of the left lower lobe with RV strain.  It also showed small patchy densities in both mid and both lower lung fields suggesting atelectasis/pneumonia, minimal bilateral pleural effusion R>L. CT head did not reveal any acute findings.  Pt underwent prior evaluation at home with SLP via home health.      SLP Plan  Discharge SLP treatment due to goals met      Recommendations for follow up therapy are one component of a multi-disciplinary discharge planning process, led by the attending physician.  Recommendations may be updated based on patient  status, additional functional criteria and insurance authorization.    Recommendations  Diet recommendations: Regular;Thin liquid Liquids provided via: Straw Medication Administration: Whole meds with puree Supervision: Staff to assist with self feeding Compensations: Slow rate;Small sips/bites Postural Changes and/or Swallow Maneuvers: Seated upright 90 degrees;Upright 30-60 min after meal                Oral Care Recommendations: Oral care BID SLP Visit Diagnosis: Dysphagia, unspecified (R13.10) Plan: Discharge SLP treatment due to (comment)          Emilyanne Mcgough B. Quentin Ore, [facility], [place] Speech Language Pathologist Office: [phone]  Shonna Chock 08/19/2022, 11:56 AM

## 2022-08-20 DIAGNOSIS — Z9981 Dependence on supplemental oxygen: Secondary | ICD-10-CM | POA: Diagnosis not present

## 2022-08-20 DIAGNOSIS — I2699 Other pulmonary embolism without acute cor pulmonale: Secondary | ICD-10-CM | POA: Diagnosis not present

## 2022-08-20 DIAGNOSIS — Z86711 Personal history of pulmonary embolism: Secondary | ICD-10-CM | POA: Diagnosis not present

## 2022-08-20 DIAGNOSIS — F03A4 Unspecified dementia, mild, with anxiety: Secondary | ICD-10-CM | POA: Diagnosis not present

## 2022-08-20 DIAGNOSIS — Z7901 Long term (current) use of anticoagulants: Secondary | ICD-10-CM | POA: Diagnosis not present

## 2022-08-20 DIAGNOSIS — K219 Gastro-esophageal reflux disease without esophagitis: Secondary | ICD-10-CM | POA: Diagnosis not present

## 2022-08-20 DIAGNOSIS — M2550 Pain in unspecified joint: Secondary | ICD-10-CM | POA: Diagnosis not present

## 2022-08-20 DIAGNOSIS — N189 Chronic kidney disease, unspecified: Secondary | ICD-10-CM | POA: Diagnosis not present

## 2022-08-20 DIAGNOSIS — Z66 Do not resuscitate: Secondary | ICD-10-CM | POA: Diagnosis not present

## 2022-08-20 DIAGNOSIS — I4891 Unspecified atrial fibrillation: Secondary | ICD-10-CM | POA: Diagnosis not present

## 2022-08-20 DIAGNOSIS — I10 Essential (primary) hypertension: Secondary | ICD-10-CM | POA: Diagnosis not present

## 2022-08-20 DIAGNOSIS — Z960 Presence of urogenital implants: Secondary | ICD-10-CM | POA: Diagnosis not present

## 2022-08-20 DIAGNOSIS — Z7409 Other reduced mobility: Secondary | ICD-10-CM | POA: Diagnosis not present

## 2022-08-20 DIAGNOSIS — I509 Heart failure, unspecified: Secondary | ICD-10-CM | POA: Diagnosis not present

## 2022-08-20 DIAGNOSIS — F01B4 Vascular dementia, moderate, with anxiety: Secondary | ICD-10-CM | POA: Diagnosis not present

## 2022-08-20 DIAGNOSIS — F419 Anxiety disorder, unspecified: Secondary | ICD-10-CM | POA: Diagnosis not present

## 2022-08-20 DIAGNOSIS — N390 Urinary tract infection, site not specified: Secondary | ICD-10-CM | POA: Diagnosis not present

## 2022-08-20 DIAGNOSIS — Z6823 Body mass index (BMI) 23.0-23.9, adult: Secondary | ICD-10-CM | POA: Diagnosis not present

## 2022-08-20 DIAGNOSIS — G9341 Metabolic encephalopathy: Secondary | ICD-10-CM | POA: Diagnosis not present

## 2022-08-20 DIAGNOSIS — I340 Nonrheumatic mitral (valve) insufficiency: Secondary | ICD-10-CM | POA: Diagnosis not present

## 2022-08-20 DIAGNOSIS — I5040 Unspecified combined systolic (congestive) and diastolic (congestive) heart failure: Secondary | ICD-10-CM | POA: Diagnosis not present

## 2022-08-20 DIAGNOSIS — I5033 Acute on chronic diastolic (congestive) heart failure: Secondary | ICD-10-CM | POA: Diagnosis not present

## 2022-08-20 DIAGNOSIS — I130 Hypertensive heart and chronic kidney disease with heart failure and stage 1 through stage 4 chronic kidney disease, or unspecified chronic kidney disease: Secondary | ICD-10-CM | POA: Diagnosis not present

## 2022-08-20 DIAGNOSIS — E785 Hyperlipidemia, unspecified: Secondary | ICD-10-CM | POA: Diagnosis not present

## 2022-08-20 DIAGNOSIS — Z7982 Long term (current) use of aspirin: Secondary | ICD-10-CM | POA: Diagnosis not present

## 2022-08-20 DIAGNOSIS — N1831 Chronic kidney disease, stage 3a: Secondary | ICD-10-CM | POA: Diagnosis not present

## 2022-08-20 DIAGNOSIS — J441 Chronic obstructive pulmonary disease with (acute) exacerbation: Secondary | ICD-10-CM | POA: Diagnosis not present

## 2022-08-20 DIAGNOSIS — N179 Acute kidney failure, unspecified: Secondary | ICD-10-CM | POA: Diagnosis not present

## 2022-08-20 DIAGNOSIS — R339 Retention of urine, unspecified: Secondary | ICD-10-CM | POA: Diagnosis not present

## 2022-08-20 DIAGNOSIS — E43 Unspecified severe protein-calorie malnutrition: Secondary | ICD-10-CM | POA: Diagnosis not present

## 2022-08-20 DIAGNOSIS — J9601 Acute respiratory failure with hypoxia: Secondary | ICD-10-CM | POA: Diagnosis not present

## 2022-08-20 DIAGNOSIS — E890 Postprocedural hypothyroidism: Secondary | ICD-10-CM | POA: Diagnosis not present

## 2022-08-20 DIAGNOSIS — B952 Enterococcus as the cause of diseases classified elsewhere: Secondary | ICD-10-CM | POA: Diagnosis not present

## 2022-08-20 DIAGNOSIS — J189 Pneumonia, unspecified organism: Secondary | ICD-10-CM | POA: Diagnosis not present

## 2022-08-20 DIAGNOSIS — J449 Chronic obstructive pulmonary disease, unspecified: Secondary | ICD-10-CM | POA: Diagnosis not present

## 2022-08-20 DIAGNOSIS — K121 Other forms of stomatitis: Secondary | ICD-10-CM | POA: Diagnosis not present

## 2022-08-20 DIAGNOSIS — R4181 Age-related cognitive decline: Secondary | ICD-10-CM | POA: Diagnosis not present

## 2022-08-20 DIAGNOSIS — Z7401 Bed confinement status: Secondary | ICD-10-CM | POA: Diagnosis not present

## 2022-08-20 NOTE — Progress Notes (Signed)
Called [facility] facility and reports given to [employer].pt is stable and no known changes noted since am assessment.

## 2022-08-20 NOTE — TOC Transition Note (Addendum)
Transition of Care [facility]) - CM/SW Discharge Note   Patient Details  Name: Erin Ryan MRN: [id number] Date of Birth: Oct 24, 1924  [facility] CM/SW Contact:  Dessa Phi, RN Phone Number: 08/20/2022, 12:27 PM   Clinical Narrative:  d/c today [place] once rep Simona Huh has received d/c [facility]. Awaiting to acceptance prior [facility]. -1p Going to [facility] [phone]. [facility] called. ETA 3-4p. Must arrive prior 7p to facility. No further CM needs.    Final next level of care: Skilled Nursing Facility Barriers to Discharge: No Barriers Identified   Patient Goals and CMS Choice Patient states their goals for this hospitalization and ongoing recovery are:: return home with home health services CMS Medicare.gov Compare Post Acute Care list provided to:: Patient Represenative (must comment) Darl Householder (daughter))    Discharge Placement                       Discharge Plan and Services In-house Referral: NA Discharge Planning Services: CM Consult            DME Arranged: N/A DME Agency: NA                  Social Determinants of Health ([place]) Interventions     Readmission Risk Interventions     No data to display

## 2022-08-20 NOTE — Progress Notes (Signed)
Occupational Therapy Treatment Patient Details Name: Erin Ryan MRN: [id number] DOB: [birth year] Today's Date: 08/20/2022   History of present illness Pt admitted from home with AMS and weakness and diagnosed with acute PE, afib with RVR, bil PNA, UTI, AKI on CKD3a, and acute metabolic encephalopathy.  Pt with hx of dementia, htn, CKD and COPD   OT comments  Patient was agreeable to therapy participation. She required min assist to stand using a RW, min assist for toileting at bathroom level, and min guard assist for grooming in standing at the sink. She required the use of a RW for support during standing tasks, even intermittent unsteadiness. She also reported having chronic knee pain that worsened in prolonged standing, and she was noted to state, "my knee is giving out."; as such, she needed to take a seated rest breaks at one period during the session. She will continue to benefit from further OT services to maximize her independence with ADLs.    Recommendations for follow up therapy are one component of a multi-disciplinary discharge planning process, led by the attending physician.  Recommendations may be updated based on patient status, additional functional criteria and insurance authorization.    Follow Up Recommendations  Home health OT vs. Short-term SNF rehab, pending availability of family support/supervision    Assistance Recommended at Discharge Intermittent Supervision/Assistance  Patient can return home with the following  A little help with walking and/or transfers;A little help with bathing/dressing/bathroom;Assistance with cooking/housework;Direct supervision/assist for financial management;Direct supervision/assist for medications management;Help with stairs or ramp for entrance   Equipment Recommendations  None recommended by OT       Precautions / Restrictions Precautions Precautions: Fall Restrictions Weight Bearing Restrictions: No       Mobility Bed  Mobility Overal bed mobility: Needs Assistance Bed Mobility: Supine to Sit     Supine to sit: Min guard, HOB elevated          Transfers Overall transfer level: Needs assistance Equipment used: Rolling walker (2 wheels) Transfers: Sit to/from Stand Sit to Stand: Min assist                     ADL either performed or assessed with clinical judgement   ADL Overall ADL's : Needs assistance/impaired Eating/Feeding: Set up;Sitting   Grooming: Standing;Min guard Grooming Details (indicate cue type and reason): She performed hand washing & teeth brushing standing at the sink, requiring min verbal cues to stand closer to the sink for added support while performing tasks.                 Toilet Transfer: Minimal assistance;Rolling walker (2 wheels);Ambulation Toilet Transfer Details (indicate cue type and reason): Required cues for walker placement, to use grab bar as needed to assist with stand from toilet, and to bend knees prior to stand attempt. Toileting- Clothing Manipulation and Hygiene: Minimal assistance Toileting - Clothing Manipulation Details (indicate cue type and reason): She performed anterior hygiene in sitting with SBA, however required assist for thoroughness with posterior hygiene in standing.              Cognition Arousal/Alertness: Awake/alert Behavior During Therapy: WFL for tasks assessed/performed Overall Cognitive Status: History of cognitive impairments - at baseline        General Comments: patient is [facility] but able to follow commands.                   Pertinent Vitals/ Pain       Pain Assessment  Pain Assessment: Faces Pain Score: 4  Pain Location: Chronic knee pain that worsens in prolonged standing Pain Intervention(s): Limited activity within patient's tolerance         Frequency  Min 2X/week        Progress Toward Goals  OT Goals(current goals can now be found in the care plan section)  Progress towards OT  goals: Progressing toward goals  Acute Rehab OT Goals OT Goal Formulation: With patient/family Time For Goal Achievement: 08/29/22 Potential to Achieve Goals: Good  Plan Discharge plan remains appropriate    AM-PAC OT "6 Clicks" Daily Activity     Outcome Measure   Help from another person eating meals?: None Help from another person taking care of personal grooming?: A Little Help from another person toileting, which includes using toliet, bedpan, or urinal?: A Little Help from another person bathing (including washing, rinsing, drying)?: A Little Help from another person to put on and taking off regular upper body clothing?: A Little Help from another person to put on and taking off regular lower body clothing?: A Little 6 Click Score: 19    End of Session Equipment Utilized During Treatment: Rolling walker (2 wheels)  OT Visit Diagnosis: Muscle weakness (generalized) (M62.81);Unsteadiness on feet (R26.81)   Activity Tolerance Patient tolerated treatment well   Patient Left in chair;with call bell/phone within reach;with family/visitor present   Nurse Communication Mobility status        Time: 2952-8413 OT Time Calculation (min): 27 min  Charges: OT General Charges $OT Visit: 1 Visit OT Treatments $Self Care/Home Management : 8-22 mins $Therapeutic Activity: 8-22 mins    Leota Sauers, OTR/L 08/20/2022, 10:54 AM

## 2022-08-20 NOTE — Progress Notes (Signed)
Mobility Specialist - Progress Note   08/20/22 1238  Mobility  Activity Ambulated with assistance in hallway  Level of Assistance Moderate assist, patient does 50-74%  Assistive Device Front wheel walker  Distance Ambulated (ft) 50 ft  Range of Motion/Exercises Active  Activity Response Tolerated well  Mobility Referral Yes  $Mobility charge 1 Mobility   Pt was found in bed and agreeable to ambulate. Was mod-A going from lying to sitting EOB and stand by for sitting to standing and ambulation. At EOS returned to [facility] with call bell in reach and daughter in room. RN notified of session.   Ferd Hibbs Mobility Specialist

## 2022-08-20 NOTE — Plan of Care (Signed)

## 2022-08-20 NOTE — Discharge Summary (Signed)
Physician Discharge Summary  Erin Ryan CHY:[id number] DOB: Mar 16, 1925 DOA: 08/10/2022  PCP: Erin Stains, MD  Admit date: 08/10/2022 Discharge date: 08/20/2022  Time spent: 60 minutes  Recommendations for Outpatient Follow-up:  Follow-up with Dr. Candee Ryan, cardiology in 2 weeks for follow-up on A-fib, CHF. Follow-up with MD at SNF.  Patient will need a basic metabolic profile, magnesium level checked in 1 week.  Patient will need repeat thyroid function studies done in approximately 4 weeks.   Discharge Diagnoses:  Principal Problem:   Acute pulmonary embolism ([place]) Active Problems:   Essential hypertension   GERD   Chronic kidney disease (CKD), stage III (moderate) (HCC)   Adult hypothyroidism   Moderate dementia without behavioral disturbance, psychotic disturbance, mood disturbance, or anxiety (HCC)   Protein-calorie malnutrition, severe   Enterococcus UTI   Atrial fibrillation with RVR (HCC)   Generalized weakness   Urinary tract infection without hematuria   Discharge Condition: Stable and improved.  Diet recommendation: Regular  Filed Weights   08/18/22 0414 08/19/22 0500 08/20/22 0500  Weight: 58 kg 57.2 kg 57.3 kg    History of present illness:  HPI per Dr. Simonne Maffucci Erin Ryan is a 88 y.o. female with PMH significant for dementia, HTN, HLD, CKD, COPD, GERD, hypothyroidism, hearing loss, anxiety, arthritis, compression fracture.   This morning, patient was brought to the ED at [facility] by her daughter with confusion, concerning for UTI.  Per daughter, patient had some urinary symptoms last week but was improved on its own.  This morning, patient was weak, acting confused.  No fever.   In the ED, patient was afebrile, initially heart rate in 90s, blood pressure 130/53, breathing on room air  While in the ED, she started having chest pain.  She was noted to be in A-fib with RVR with heart rate up to 160 bpm.  She was given a dose of IV metoprolol  5 mg with conversion to normal sinus rhythm with heart rate in 70s Labs showed WBC count 12.5, hemoglobin 9.8, platelet 225, sodium 135, potassium 4, BUN/creatinine 51/1.22, glucose 139, BNP elevated 524, troponin normal, lactic acid level normal Respite virus panel negative for COVID, flu Urinalysis showed cloudy yellow urine with trace hemoglobin, large leukocytes, negative nitrate, few bacteria CT angio chest showed an acute PE with small thrombus burden in the subsegmental branch of the left lower lobe with RV strain.  It also showed small patchy densities in both mid and both lower lung fields suggesting atelectasis/pneumonia, minimal bilateral pleural effusion R>L. CT head with no acute findings. Patient was started on heparin drip.  Also empirically started on IV Rocephin. Accepted to [facility] service for inpatient admission and management.   At the time of my evaluation, patient was lying down in bed.  On low-flow oxygen for comfort.  Extremely hard of hearing.  Not in any distress.  She was able to give some details of the history. Shortly before my eval, patient is back in A-fib with RVR again up to 140s without any symptoms.  No family at bedside. Called and discussed with patient's daughter Erin Ryan on the phone   Hospital Course:  #1 acute pulmonary emboli -CT angiogram chest showed acute PE with small thrombus burden subsegmental branch of the left lower lobe with RV strain as well. -Lower extremity Dopplers done negative for DVT. -2D echo done with EF of 55 to 60%, NWMA, mild asymmetric LVH of the basal septal segment, grade 2 diastolic dysfunction, low normal right  ventricular systolic function, right ventricular size mildly enlarged, mildly elevated pulmonary artery systolic pressure, mildly dilated right atrial size.  Mild MVR.  Mild mitral stenosis.  Moderate mitral annular calcification.  Mild thickening of aortic valve.  Moderate AVR. -Was on heparin drip and transitioned to Eliquis as  patient had improved clinically with decreased O2 requirements.   -Outpatient follow-up.    2.  Acute respiratory failure with hypoxia -Multifactorial secondary to pneumonia, PE, A-fib with RVR, possible volume overload. -Patient with increasing O2 requirements on 6 L nasal cannula on 08/13/2022. -IV fluids discontinued. -Repeat chest x-ray (08/13/2022) with significant increased bilateral lung opacities noted concerning for worsening edema with mild bilateral pleural effusions. -Patient with decreased breath sounds noted on examination which have improved.. -Volume overload likely secondary to A-fib with RVR. -Hypoxia improved currently sats of 95-99% on room air by day of discharge.. -Status post Lasix 20 mg IV every 12 hours x 2 days with good urine output.  -IV Lasix have been discontinued. -Was on IV antibiotics and transitioned to oral antibiotics and completed a full course of antibiotic treatment for pneumonia.   -Was on IV heparin and transitioned to Eliquis.  -Was on amiodarone drip and transitioned to oral amiodarone per cardiology. -Patient improved clinically and was back to baseline by day of discharge.  Outpatient follow-up. -On room air at time of d/c   3.  A-fib with RVR -Patient with no prior history of A-fib in the past. -Noted initially on admission in the ED, responded with IV metoprolol and placed on oral metoprolol. -Patient noted to have RVR recur and unable to place on Cardizem due to significant allergy, status post 1 dose IV digoxin and patient subsequently placed on amiodarone drip. -Cardiology consulted and patient transitioned from amiodarone drip to oral amiodarone on 08/13/2022, per cardiology. -Cardiology recommended amiodarone 200 mg twice daily x 1 week and then 200 mg daily thereafter. -Patient noted the morning of 08/14/2022, to transiently go into A-fib with RVR while transferring to commode and went back into normal sinus rhythm while cardiology was  assessing patient. -Was on IV heparin and transitioned to Eliquis as patient also noted to have PE.  -Cardiology recommended low-dose Eliquis 2.5 mg twice daily due to patient's advanced age, renal function rather than the typical apixaban PE dosing. -Patient transitioned to Eliquis per pharmacy.  -Outpatient follow-up with cardiology.   4.  Hyperthyroidism/history of hypothyroidism -Per family patient noted to have thyroid removal surgery 5 decades ago noted to be on Synthroid since then and Synthroid recently at 125 mcg's daily. -Unsure of last time Synthroid was adjusted. -TFTs obtained 08/11/2022 with a TSH significantly suppressed to < 0.01, free T4 elevated to 2.04. -Synthroid held for most of the hospitalization and subsequently resumed at a decreased dose of 100 mcg daily on day of discharge.  -Patient will need outpatient follow-up with repeat TFTs done in 4 weeks.   5.  Mild bilateral pneumonia -CT chest done with small patchy densities in both mid and lower lung lobes suggestive of atelectasis/pneumonia, minimal bilateral pleural effusion R > L. -Patient remained afebrile.  Leukocytosis trended down and had resolved by day of discharge.   -Patient initially was on IV Unasyn, subsequently transition to Augmentin and completed a full course of antibiotic treatment.   -No further antibiotics needed on discharge.   6.  Enterococcus faecalis UTI -Was on IV Unasyn and transitioned to Augmentin and completed a course of antibiotic treatment during the hospitalization.   -No further  antibiotics needed on discharge.    7.  Acute metabolic encephalopathy/vascular dementia/anxiety -Presenting complaints with confusion likely multifactorial secondary to UTI, pneumonia in the setting of dementia. -Mental status improved daily during the hospitalization and was back to baseline by day of discharge.   -Patient completed course of antibiotics, maintained on home regimen Aricept, Cymbalta,  Neurontin and Remeron.   -Outpatient follow-up.    8.  AKI on CKD stage IIIa. -Baseline creatinine < 1. -Creatinine initially noted to be slowly trending up as high as 1.36. -IV fluids discontinued creatinine down to 1.15 -Patient started on IV Lasix due to concerns for volume overload, renal function trended down to 1.12, and seems to be stabilizing at 1.30 by day of discharge. -Outpatient follow-up.   9.  Acute on chronic diastolic CHF -Patient with increasing respiratory hypoxia and noted to be on 6 L nasal cannula oxygen 08/13/2022. -Acute CHF exacerbation likely triggered by A-fib with RVR. -Chest x-ray done, 08/13/2022 with significant increased bilateral lung opacities noted concerning for worsening edema with mild bilateral pleural effusion. -2D echo with EF of 55 to 61%, grade 2 diastolic dysfunction, mildly enlarged RV and mildly elevated PASP. -Patient noted to have been on Norvasc, metoprolol, Lasix prior to admission. -A-fib rate improved on amiodarone and patient noted to have converted to sinus rhythm, transiently converted back to A-fib with RVR early morning of 08/14/2022,while being transferred to commode per cardiology. -Patient subsequently went back into normal sinus rhythm. -BNP noted to be elevated at 523.6 on 08/10/2022. -Status post Lasix 20 mg IV every 12 hours with good urine output.  -Currently on room air -Patient being followed by cardiology who recommended holding any further diuresis at this time and on discharge able to go back to home regimen of 20 mg of furosemide every other day. -Outpatient follow-up with cardiology.   10.  Hypertension -Patient noted to have been on amlodipine 2.5 mg daily, metoprolol 25 mg twice daily, Lasix 20 mg qod prior to admission. -Amlodipine and Lasix held since admission. -Patient maintained on metoprolol and dose uptitrated per cardiology. -Status post IV Lasix every 12 hours for volume overload with good urine output.    -IV Lasix discontinued.   -BP stable.   -Resume home regimen Lasix on discharge per cardiology recommendations.   11.  Hyperlipidemia -Noted to have been on aspirin and statin prior to admission. -Patient maintained on statin.  Aspirin discontinued as patient started on Eliquis.   -Outpatient follow-up.   12.  COPD -Patient changed from albuterol to Xopenex nebs due to A-fib with RVR. -Patient also placed on Dulera, Incruse.   -Outpatient follow-up.     13.  GERD -Patient maintained on PPI.   14.  Impaired mobility -Continue PT/OT -Skilled nursing facility recommended.     15.  Severe protein calorie malnutrition -Patient placed on nutritional supplementation.     16.  Oropharyngeal ulcer on hard palate -Per daughter was not there 3 months ago. -Outpatient follow-up with oral surgeon/dentist.   17.  Urinary retention -Patient noted earlier on the hospitalization to have urinary retention, Foley catheter placed. -Foley catheter discontinued 08/16/2022, patient noted to good urine output per RN.  Bladder scan which was checked showed 0 cc.  -Outpatient follow-up.        Procedures: CT angiogram chest 08/10/2022 CT head 08/10/2022 Chest x-ray 08/10/2022, 08/13/2022 2D echo 08/11/2022 Lower extremity Dopplers 08/11/2022  Consultations: Cardiology: Dr. Marlou Porch 08/12/2022   Discharge Exam: Vitals:   08/20/22 9509 08/20/22 3267  BP: (!) 151/48   Pulse: (!) 52   Resp: 20   Temp: 98 F (36.7 C)   SpO2: 96% 98%    General: NAD Cardiovascular: Regular rate rhythm no murmurs rubs or gallops.  No JVD.  No lower extremity edema. Respiratory: Some decreased breath sounds in the bases otherwise clear.  No wheezing, no crackles, no rhonchi.  Fair air movement.  Speaking in full sentences.  Discharge Instructions   Discharge Instructions     Diet general   Complete by: As directed    Increase activity slowly   Complete by: As directed       Allergies as of  08/20/2022       Reactions   Ambrosia Trifida (tall Ragweed) Allergy Skin Test Other (See Comments)   Other reaction(s): Other   Hydrochlorothiazide    hyponatremia   Lyrica [pregabalin] Other (See Comments)   Severe confusion   Diltiazem Hcl Er Rash   Short Ragweed Pollen Ext    Other reaction(s): Other        Medication List     STOP taking these medications    amLODipine 2.5 MG tablet Commonly known as: NORVASC   aspirin 81 MG tablet       TAKE these medications    acetaminophen 325 MG tablet Commonly known as: TYLENOL Take 2 tablets (650 mg total) by mouth every 6 (six) hours as needed for mild pain (or Fever >/= 101).   amiodarone 200 MG tablet Commonly known as: PACERONE Take 1 tablet (200 mg total) by mouth 2 (two) times daily for 2 days, THEN 1 tablet (200 mg total) daily. Start taking on: August 19, 2022   apixaban 2.5 MG Tabs tablet Commonly known as: ELIQUIS Take 1 tablet (2.5 mg total) by mouth 2 (two) times daily.   atorvastatin 40 MG tablet Commonly known as: LIPITOR Take 40 mg by mouth daily.   CALCIUM CITRATE + PO Take 1 tablet 2 (two) times daily by mouth.   CENTRUM SILVER PO Take 1 tablet by mouth daily.   donepezil 10 MG tablet Commonly known as: ARICEPT Take 1 tablet (10 mg total) by mouth at bedtime.   DULoxetine 30 MG capsule Commonly known as: CYMBALTA Take 30 mg by mouth daily.   furosemide 20 MG tablet Commonly known as: LASIX Take 1 tablet (20 mg total) by mouth every other day.   gabapentin 100 MG capsule Commonly known as: NEURONTIN Take 100 mg by mouth at bedtime.   lactose free nutrition Liqd Take 237 mLs by mouth 2 (two) times daily between meals.   feeding supplement Liqd Take 237 mLs by mouth daily.   levalbuterol 0.63 MG/3ML nebulizer solution Commonly known as: XOPENEX Take 3 mLs (0.63 mg total) by nebulization every 6 (six) hours as needed for wheezing or shortness of breath.   levothyroxine 100 MCG  tablet Commonly known as: SYNTHROID Take 1 tablet (100 mcg total) by mouth in the morning. DAW1 Synthroid What changed:  medication strength how much to take   Metoprolol Tartrate 75 MG Tabs Take 75 mg by mouth 2 (two) times daily. What changed:  medication strength how much to take   mirtazapine 15 MG tablet Commonly known as: REMERON Take 15 mg by mouth at bedtime.   mometasone-formoterol 200-5 MCG/ACT Aero Commonly known as: DULERA Inhale 2 puffs into the lungs 2 (two) times daily.   omeprazole 40 MG capsule Commonly known as: PRILOSEC Take 1 capsule (40 mg total) by mouth 2 (two)  times daily before a meal. What changed: when to take this   polyvinyl alcohol 1.4 % ophthalmic solution Commonly known as: LIQUIFILM TEARS Place 1 drop into both eyes as needed for dry eyes.   Prolia 60 MG/ML Sosy injection Generic drug: denosumab Inject 60 mg into the skin every 6 (six) months.   umeclidinium bromide 62.5 MCG/ACT Aepb Commonly known as: INCRUSE ELLIPTA Inhale 1 puff into the lungs daily.   VOLTAREN EX Apply 1 Application topically as needed (pain).       Allergies  Allergen Reactions   Ambrosia Trifida (Tall Ragweed) Allergy Skin Test Other (See Comments)    Other reaction(s): Other   Hydrochlorothiazide     hyponatremia   Lyrica [Pregabalin] Other (See Comments)    Severe confusion   Diltiazem Hcl Er Rash   Short Ragweed Pollen Ext     Other reaction(s): Other    Follow-up Information     MD at SNF Follow up.          Jerline Pain, MD. Schedule an appointment as soon as possible for a visit in 2 week(s).   Specialty: Cardiology Contact information: [address] [place] [address] [phone]                  The results of significant diagnostics from this hospitalization (including imaging, microbiology, ancillary and laboratory) are listed below for reference.    Significant Diagnostic Studies: DG CHEST PORT  1 VIEW  Result Date: 08/13/2022 CLINICAL DATA:  Hypoxia. EXAM: PORTABLE CHEST 1 VIEW COMPARISON:  August 10, 2022. FINDINGS: Stable cardiomediastinal silhouette. Significant increased bilateral lung opacities are noted concerning for worsening edema with mild bilateral pleural effusions. Bony thorax is unremarkable. IMPRESSION: Significant increased bilateral lung opacities are noted concerning for worsening edema with mild bilateral pleural effusions. Electronically Signed   By: Marijo Conception M.D.   On: 08/13/2022 08:48   VAS [place] LOWER EXTREMITY VENOUS (DVT)  Result Date: 08/11/2022  Lower Venous DVT Study Patient Name:  SHAYNE DEERMAN  Date of Exam:   08/11/2022 Medical Rec #: [id number]      Accession #:    [id number] Date of Birth: November 28, 1924       Patient Gender: F Patient Age:   39 years Exam Location:  [facility] Procedure:      VAS [place] LOWER EXTREMITY VENOUS (DVT) Referring Phys: Terrilee Croak --------------------------------------------------------------------------------  Indications: Pulmonary embolism.  Risk Factors: None identified. Comparison Study: No prior studies. Performing Technologist: Oliver Hum RVT  Examination Guidelines: A complete evaluation includes B-mode imaging, spectral Doppler, color Doppler, and power Doppler as needed of all accessible portions of each vessel. Bilateral testing is considered an integral part of a complete examination. Limited examinations for reoccurring indications may be performed as noted. The reflux portion of the exam is performed with the patient in reverse Trendelenburg.  +---------+---------------+---------+-----------+----------+--------------+ RIGHT    CompressibilityPhasicitySpontaneityPropertiesThrombus Aging +---------+---------------+---------+-----------+----------+--------------+ CFV      Full           Yes      Yes                                  +---------+---------------+---------+-----------+----------+--------------+ SFJ      Full                                                        +---------+---------------+---------+-----------+----------+--------------+  FV Prox  Full                                                        +---------+---------------+---------+-----------+----------+--------------+ FV Mid   Full                                                        +---------+---------------+---------+-----------+----------+--------------+ FV DistalFull                                                        +---------+---------------+---------+-----------+----------+--------------+ PFV      Full                                                        +---------+---------------+---------+-----------+----------+--------------+ POP      Full           Yes      Yes                                 +---------+---------------+---------+-----------+----------+--------------+ PTV      Full                                                        +---------+---------------+---------+-----------+----------+--------------+ PERO     Full                                                        +---------+---------------+---------+-----------+----------+--------------+   +---------+---------------+---------+-----------+----------+--------------+ LEFT     CompressibilityPhasicitySpontaneityPropertiesThrombus Aging +---------+---------------+---------+-----------+----------+--------------+ CFV      Full           Yes      Yes                                 +---------+---------------+---------+-----------+----------+--------------+ SFJ      Full                                                        +---------+---------------+---------+-----------+----------+--------------+ FV Prox  Full                                                         +---------+---------------+---------+-----------+----------+--------------+  FV Mid   Full                                                        +---------+---------------+---------+-----------+----------+--------------+ FV DistalFull           Yes      Yes                                 +---------+---------------+---------+-----------+----------+--------------+ PFV      Full                                                        +---------+---------------+---------+-----------+----------+--------------+ POP      Full           Yes      Yes                                 +---------+---------------+---------+-----------+----------+--------------+ PTV      Full                                                        +---------+---------------+---------+-----------+----------+--------------+ PERO     Full                                                        +---------+---------------+---------+-----------+----------+--------------+     Summary: RIGHT: - There is no evidence of deep vein thrombosis in the lower extremity. However, portions of this examination were limited- see technologist comments above.  - No cystic structure found in the popliteal fossa.  LEFT: - There is no evidence of deep vein thrombosis in the lower extremity. However, portions of this examination were limited- see technologist comments above.  - No cystic structure found in the popliteal fossa.  *See table(s) above for measurements and observations. Electronically signed by Servando Snare MD on 08/11/2022 at 5:30:34 PM.    Final    ECHOCARDIOGRAM COMPLETE  Result Date: 08/11/2022    ECHOCARDIOGRAM REPORT   Patient Name:   BETANIA DIZON Date of Exam: 08/11/2022 Medical Rec #:  [id number]     Height:       62.0 in Accession #:    [id number]    Weight:       127.4 lb Date of Birth:  1925-09-22      BSA:          1.578 m Patient Age:    88 years      BP:           133/44 mmHg Patient Gender: F              HR:           89 bpm. Exam Location:  Inpatient Procedure:  2D Echo, 3D Echo, Cardiac Doppler and Color Doppler Indications:    Abnormal ECG 794.31 / R94.31  History:        Patient has prior history of Echocardiogram examinations, most                 recent 03/01/2021. COPD; Risk Factors:Hypertension, Dyslipidemia                 and Former Smoker. Choronic kidney disease. Acute pulmonary                 embolism. History of A-fib with RVR. Mild bilateral pneumonia.  Sonographer:    Darlina Sicilian RDCS Referring Phys: [id number] [place]  1. Left ventricular ejection fraction, by estimation, is 55 to 60%. The left ventricle has normal function. The left ventricle has no regional wall motion abnormalities. There is mild asymmetric left ventricular hypertrophy of the basal-septal segment. Left ventricular diastolic parameters are consistent with Grade II diastolic dysfunction (pseudonormalization). Elevated left ventricular end-diastolic pressure.  2. Right ventricular systolic function is low normal. The right ventricular size is mildly enlarged. There is mildly elevated pulmonary artery systolic pressure.  3. Right atrial size was mildly dilated.  4. The mitral valve is grossly normal. Mild mitral valve regurgitation. Mild mitral stenosis. Moderate mitral annular calcification.  5. The aortic valve is tricuspid. There is mild thickening of the aortic valve. Aortic valve regurgitation is moderate. Aortic valve sclerosis is present, with no evidence of aortic valve stenosis. FINDINGS  Left Ventricle: Left ventricular ejection fraction, by estimation, is 55 to 60%. The left ventricle has normal function. The left ventricle has no regional wall motion abnormalities. The left ventricular internal cavity size was normal in size. There is  mild asymmetric left ventricular hypertrophy of the basal-septal segment. Left ventricular diastolic parameters are consistent with Grade II diastolic dysfunction  (pseudonormalization). Elevated left ventricular end-diastolic pressure. Right Ventricle: The right ventricular size is mildly enlarged. No increase in right ventricular wall thickness. Right ventricular systolic function is low normal. There is mildly elevated pulmonary artery systolic pressure. The tricuspid regurgitant velocity is 2.91 m/s, and with an assumed right atrial pressure of 3 mmHg, the estimated right ventricular systolic pressure is 84.1 mmHg. Left Atrium: Left atrial size was normal in size. Right Atrium: Right atrial size was mildly dilated. Pericardium: Trivial pericardial effusion is present. The pericardial effusion is posterior to the left ventricle. Mitral Valve: The mitral valve is grossly normal. There is mild thickening of the mitral valve leaflet(s). Moderate mitral annular calcification located on the posterior annulus. Mild mitral valve regurgitation. Mild mitral valve stenosis. MV peak gradient, 7.3 mmHg. The mean mitral valve gradient is 4.0 mmHg with average heart rate of 98 bpm. Tricuspid Valve: The tricuspid valve is grossly normal. Tricuspid valve regurgitation is mild . No evidence of tricuspid stenosis. Aortic Valve: The aortic valve is tricuspid. There is mild thickening of the aortic valve. There is mild aortic valve annular calcification. Aortic valve regurgitation is moderate. Aortic valve sclerosis is present, with no evidence of aortic valve stenosis. Pulmonic Valve: The pulmonic valve was grossly normal. Pulmonic valve regurgitation is not visualized. No evidence of pulmonic stenosis. Aorta: The aortic root is normal in size and structure. IAS/Shunts: The atrial septum is grossly normal.  LEFT VENTRICLE PLAX 2D LVIDd:         3.30 cm   Diastology LVIDs:         1.60 cm   LV e' medial:  5.87 cm/s LV PW:         0.80 cm   LV E/e' medial:  26.7 LV IVS:        0.90 cm   LV e' lateral:   7.07 cm/s LVOT diam:     2.00 cm   LV E/e' lateral: 22.2 LV SV:         40 LV SV Index:    25 LVOT Area:     3.14 cm                           3D Volume EF:                          3D EF:        58 %                          LV EDV:       75 ml                          LV ESV:       32 ml                          LV SV:        43 ml RIGHT VENTRICLE RV Basal diam:  3.20 cm RV Mid diam:    2.40 cm RV S prime:     11.40 cm/s TAPSE (M-mode): 1.3 cm LEFT ATRIUM             Index        RIGHT ATRIUM           Index LA diam:        3.40 cm 2.15 cm/m   RA Area:     11.10 cm LA Vol (A2C):   32.1 ml 20.34 ml/m  RA Volume:   20.50 ml  12.99 ml/m LA Vol (A4C):   44.5 ml 28.19 ml/m LA Biplane Vol: 39.2 ml 24.84 ml/m  AORTIC VALVE LVOT Vmax:   66.17 cm/s LVOT Vmean:  41.962 cm/s LVOT VTI:    0.127 m  AORTA Ao Root diam: 3.10 cm Ao Asc diam:  2.80 cm MITRAL VALVE                TRICUSPID VALVE MV Area (PHT): 3.46 cm     TR Peak grad:   33.9 mmHg MV Peak grad:  7.3 mmHg     TR Vmax:        291.00 cm/s MV Mean grad:  4.0 mmHg MV Vmax:       1.35 m/s     SHUNTS MV Vmean:      98.4 cm/s    Systemic VTI:  0.13 m MV Decel Time: 219 msec     Systemic Diam: 2.00 cm MV E velocity: 157.00 cm/s MV A velocity: 119.00 cm/s MV E/A ratio:  1.32 Skeet Latch MD Electronically signed by Skeet Latch MD Signature Date/Time: 08/11/2022/9:03:26 AM    Final    CT Angio Chest PE W and/or Wo Contrast  Result Date: 08/10/2022 CLINICAL DATA:  High clinical suspicion for PE EXAM: CT ANGIOGRAPHY CHEST WITH CONTRAST TECHNIQUE: Multidetector CT imaging of the chest was performed using the standard protocol during bolus administration of intravenous contrast. Multiplanar CT image reconstructions and  MIPs were obtained to evaluate the vascular anatomy. RADIATION DOSE REDUCTION: This exam was performed according to the departmental dose-optimization program which includes automated exposure control, adjustment of the mA and/or kV according to patient size and/or use of iterative reconstruction technique. CONTRAST:  36m OMNIPAQUE  IOHEXOL 350 MG/ML SOLN COMPARISON:  CT done on 03/01/2022, chest radiograph done on 08/10/2022 FINDINGS: Cardiovascular: There is homogeneous enhancement in thoracic aorta. Scattered calcifications are seen in thoracic aorta and its major branches. Coronary artery calcifications are seen. Intraluminal filling defect is seen in a subsegmental pulmonary artery branch in posterior left lower lung field. RV LV ratio is 1.4. Small pocket of air is seen in right subclavian vein, possibly introduced during venipuncture. Mediastinum/Nodes: No significant lymphadenopathy is seen in mediastinum. There is gaseous distention of thoracic esophagus. Lungs/Pleura: Small patchy infiltrates are seen in right middle lobe, left upper lobe and both lower lobes. There is mild thickening of interlobular septi. Minimal bilateral pleural effusions are seen, more so on the right side. Upper Abdomen: There is fatty infiltration in liver. There is 2.6 cm cyst in the left lobe of liver. Small hiatal hernia is seen. Left kidney is smaller than usual. This may be a congenital variation or suggest left renal artery stenosis. Musculoskeletal: No acute findings are seen. Review of the MIP images confirms the above findings. IMPRESSION: There is acute PE with small thrombus burden in subsegmental branch in left lower lobe. There is right ventricular strain which may be chronic. RV LV ratio is 1.4. Aortic atherosclerosis.  Coronary artery calcifications are seen. There are small patchy densities in both mid and both lower lung fields suggesting atelectasis/pneumonia. Minimal bilateral pleural effusions, more so on the right side. There is mild thickening of interlobular septi suggesting possible minimal interstitial edema. Imaging finding of pulmonary embolism was relayed to patient's provider Dr. SBilly Fischerby telephone call. Fatty liver.  Left hepatic cyst.  Left kidney is smaller than right. Electronically Signed   By: PElmer PickerM.D.    On: 08/10/2022 13:32   CT Head Wo Contrast  Result Date: 08/10/2022 CLINICAL DATA:  Barium EXAM: CT HEAD WITHOUT CONTRAST TECHNIQUE: Contiguous axial images were obtained from the base of the skull through the vertex without intravenous contrast. RADIATION DOSE REDUCTION: This exam was performed according to the departmental dose-optimization program which includes automated exposure control, adjustment of the mA and/or kV according to patient size and/or use of iterative reconstruction technique. COMPARISON:  CT Head 03/01/22. FINDINGS: Brain: No evidence of acute infarction, hemorrhage, hydrocephalus, extra-axial collection or mass lesion/mass effect. Sequela of moderate chronic microvascular ischemic change. Vascular: No hyperdense vessel or unexpected calcification. Skull: Normal. Negative for fracture or focal lesion. Sinuses/Orbits: Bilateral lens replacements. Bilateral staphyloma is. Mild mucosal thickening left maxillary sinus. Other: None. IMPRESSION: No CT finding to explain altered mental status. Electronically Signed   By: HMarin RobertsM.D.   On: 08/10/2022 11:42   DG Chest Portable 1 View  Result Date: 08/10/2022 CLINICAL DATA:  Shortness of breath EXAM: PORTABLE CHEST 1 VIEW COMPARISON:  06/25/2017 FINDINGS: There is poor inspiration. Cardiac size is within normal limits. There are no signs of alveolar pulmonary edema or focal pulmonary consolidation. Left lateral costophrenic angle is indistinct. IMPRESSION: There are no signs of alveolar pulmonary edema or focal pulmonary consolidation. Left lateral CP angle is indistinct which may be due to poor inspiration or suggest minimal effusion. Electronically Signed   By: PElmer PickerM.D.   On: 08/10/2022 11:39  Microbiology: Recent Results (from the past 240 hour(s))  Resp Panel by RT-PCR (Flu A&B, Covid)     Status: None   Collection Time: 08/10/22 11:53 AM   Specimen: Nasal Swab  Result Value Ref Range Status   SARS  Coronavirus 2 by RT PCR NEGATIVE NEGATIVE Final    Comment: (NOTE) SARS-CoV-2 target nucleic acids are NOT DETECTED.  The SARS-CoV-2 RNA is generally detectable in upper respiratory specimens during the acute phase of infection. The lowest concentration of SARS-CoV-2 viral copies this assay can detect is 138 copies/mL. A negative result does not preclude SARS-Cov-2 infection and should not be used as the sole basis for treatment or other patient management decisions. A negative result may occur with  improper specimen collection/handling, submission of specimen other than nasopharyngeal swab, presence of viral mutation(s) within the areas targeted by this assay, and inadequate number of viral copies(<138 copies/mL). A negative result must be combined with clinical observations, patient history, and epidemiological information. The expected result is Negative.  Fact Sheet for Patients:  EntrepreneurPulse.com.au  Fact Sheet for Healthcare Providers:  IncredibleEmployment.be  This test is no t yet approved or cleared by the [place] FDA and  has been authorized for detection and/or diagnosis of SARS-CoV-2 by FDA under an Emergency Use Authorization (EUA). This EUA will remain  in effect (meaning this test can be used) for the duration of the COVID-19 declaration under Section 564(b)(1) of the Act, 21 U.S.C.section 360bbb-3(b)(1), unless the authorization is terminated  or revoked sooner.       Influenza A by PCR NEGATIVE NEGATIVE Final   Influenza B by PCR NEGATIVE NEGATIVE Final    Comment: (NOTE) The Xpert Xpress SARS-CoV-2/FLU/RSV plus assay is intended as an aid in the diagnosis of influenza from Nasopharyngeal swab specimens and should not be used as a sole basis for treatment. Nasal washings and aspirates are unacceptable for Xpert Xpress SARS-CoV-2/FLU/RSV testing.  Fact Sheet for  Patients: EntrepreneurPulse.com.au  Fact Sheet for Healthcare Providers: IncredibleEmployment.be  This test is not yet approved or cleared by the [place] FDA and has been authorized for detection and/or diagnosis of SARS-CoV-2 by FDA under an Emergency Use Authorization (EUA). This EUA will remain in effect (meaning this test can be used) for the duration of the COVID-19 declaration under Section 564(b)(1) of the Act, 21 U.S.C. section 360bbb-3(b)(1), unless the authorization is terminated or revoked.  Performed at [facility], [address] [place], [place] [address]   Urine Culture     Status: Abnormal   Collection Time: 08/10/22 11:54 AM   Specimen: Urine, Clean Catch  Result Value Ref Range Status   Specimen Description   Final    URINE, CLEAN CATCH Performed at [facility], [address] [place], [place] [address]    Special Requests   Final    NONE Performed at [facility], [address] [place], [place] [address]    Culture >=100,000 COLONIES/mL ENTEROCOCCUS FAECALIS (A)  Final   Report Status 08/12/2022 FINAL  Final   Organism ID, Bacteria ENTEROCOCCUS FAECALIS (A)  Final      Susceptibility   Enterococcus faecalis - MIC*    AMPICILLIN <=2 SENSITIVE Sensitive     NITROFURANTOIN <=16 SENSITIVE Sensitive     VANCOMYCIN 1 SENSITIVE Sensitive     * >=100,000 COLONIES/mL ENTEROCOCCUS FAECALIS  MRSA Next Gen by PCR, Nasal     Status: None   Collection Time: 08/10/22  5:52 PM   Specimen: Nasal Mucosa; Nasal Swab  Result Value Ref Range Status   MRSA by PCR Next Gen NOT DETECTED NOT DETECTED Final    Comment: (NOTE) The GeneXpert MRSA Assay (FDA approved for NASAL specimens only), is one component of a comprehensive MRSA colonization surveillance program. It is not intended to diagnose MRSA infection nor to guide or monitor treatment for MRSA infections. Test performance is not FDA  approved in patients less than 48 years old. Performed at [facility], [place] [address] [place],  [address]      Labs: Basic Metabolic Panel: Recent Labs  Lab 08/15/22 0307 08/16/22 0551 08/17/22 0524 08/18/22 0418 08/19/22 0449  NA 139 138 139 138 140  K 4.2 5.0 5.0 5.0 5.0  CL 104 103 104 105 106  CO2 '25 25 25 24 24  '$ GLUCOSE 102* 94 98 112* 101*  BUN 46* 46* 47* 42* 39*  CREATININE 1.29* 1.21* 1.22* 1.25* 1.30*  CALCIUM 8.4* 9.0 9.0 9.4 9.1  MG 2.0 2.2  --   --   --     Liver Function Tests: No results for input(s): "AST", "ALT", "ALKPHOS", "BILITOT", "PROT", "ALBUMIN" in the last 168 hours. No results for input(s): "LIPASE", "AMYLASE" in the last 168 hours. No results for input(s): "AMMONIA" in the last 168 hours. CBC: Recent Labs  Lab 08/14/22 0319 08/15/22 0307 08/16/22 0551  WBC 7.8 8.4 8.6  NEUTROABS 5.3  --   --   HGB 10.7* 11.0* 11.4*  HCT 34.2* 35.0* 36.7  MCV 90.7 89.7 90.6  PLT 269 301 320    Cardiac Enzymes: No results for input(s): "CKTOTAL", "CKMB", "CKMBINDEX", "TROPONINI" in the last 168 hours. BNP: BNP (last 3 results) Recent Labs    08/10/22 1103  BNP 523.6*     ProBNP (last 3 results) No results for input(s): "PROBNP" in the last 8760 hours.  CBG: No results for input(s): "GLUCAP" in the last 168 hours.     Signed:  Marylu Lund MD.  [facility] 08/20/2022, 9:59 AM

## 2022-08-21 ENCOUNTER — Other Ambulatory Visit: Payer: Self-pay | Admitting: Adult Health

## 2022-08-26 DIAGNOSIS — E43 Unspecified severe protein-calorie malnutrition: Secondary | ICD-10-CM | POA: Diagnosis not present

## 2022-08-26 DIAGNOSIS — Z86711 Personal history of pulmonary embolism: Secondary | ICD-10-CM | POA: Diagnosis not present

## 2022-08-26 DIAGNOSIS — B952 Enterococcus as the cause of diseases classified elsewhere: Secondary | ICD-10-CM | POA: Diagnosis not present

## 2022-08-26 DIAGNOSIS — Z960 Presence of urogenital implants: Secondary | ICD-10-CM | POA: Diagnosis not present

## 2022-08-26 DIAGNOSIS — E890 Postprocedural hypothyroidism: Secondary | ICD-10-CM | POA: Diagnosis not present

## 2022-08-26 DIAGNOSIS — N179 Acute kidney failure, unspecified: Secondary | ICD-10-CM | POA: Diagnosis not present

## 2022-08-26 DIAGNOSIS — F03A4 Unspecified dementia, mild, with anxiety: Secondary | ICD-10-CM | POA: Diagnosis not present

## 2022-08-26 DIAGNOSIS — E785 Hyperlipidemia, unspecified: Secondary | ICD-10-CM | POA: Diagnosis not present

## 2022-08-26 DIAGNOSIS — Z66 Do not resuscitate: Secondary | ICD-10-CM | POA: Diagnosis not present

## 2022-08-26 DIAGNOSIS — I5033 Acute on chronic diastolic (congestive) heart failure: Secondary | ICD-10-CM | POA: Diagnosis not present

## 2022-08-26 DIAGNOSIS — N390 Urinary tract infection, site not specified: Secondary | ICD-10-CM | POA: Diagnosis not present

## 2022-08-26 DIAGNOSIS — N1831 Chronic kidney disease, stage 3a: Secondary | ICD-10-CM | POA: Diagnosis not present

## 2022-08-26 DIAGNOSIS — K121 Other forms of stomatitis: Secondary | ICD-10-CM | POA: Diagnosis not present

## 2022-08-26 DIAGNOSIS — J189 Pneumonia, unspecified organism: Secondary | ICD-10-CM | POA: Diagnosis not present

## 2022-08-26 DIAGNOSIS — I130 Hypertensive heart and chronic kidney disease with heart failure and stage 1 through stage 4 chronic kidney disease, or unspecified chronic kidney disease: Secondary | ICD-10-CM | POA: Diagnosis not present

## 2022-08-26 DIAGNOSIS — K219 Gastro-esophageal reflux disease without esophagitis: Secondary | ICD-10-CM | POA: Diagnosis not present

## 2022-08-26 DIAGNOSIS — G9341 Metabolic encephalopathy: Secondary | ICD-10-CM | POA: Diagnosis not present

## 2022-08-26 DIAGNOSIS — J441 Chronic obstructive pulmonary disease with (acute) exacerbation: Secondary | ICD-10-CM | POA: Diagnosis not present

## 2022-08-26 DIAGNOSIS — I4891 Unspecified atrial fibrillation: Secondary | ICD-10-CM | POA: Diagnosis not present

## 2022-08-26 DIAGNOSIS — Z7409 Other reduced mobility: Secondary | ICD-10-CM | POA: Diagnosis not present

## 2022-08-26 DIAGNOSIS — J9601 Acute respiratory failure with hypoxia: Secondary | ICD-10-CM | POA: Diagnosis not present

## 2022-08-26 DIAGNOSIS — Z6823 Body mass index (BMI) 23.0-23.9, adult: Secondary | ICD-10-CM | POA: Diagnosis not present

## 2022-08-26 DIAGNOSIS — R339 Retention of urine, unspecified: Secondary | ICD-10-CM | POA: Diagnosis not present

## 2022-08-26 DIAGNOSIS — Z7901 Long term (current) use of anticoagulants: Secondary | ICD-10-CM | POA: Diagnosis not present

## 2022-09-02 DIAGNOSIS — I4891 Unspecified atrial fibrillation: Secondary | ICD-10-CM | POA: Diagnosis not present

## 2022-09-02 DIAGNOSIS — N1831 Chronic kidney disease, stage 3a: Secondary | ICD-10-CM | POA: Diagnosis not present

## 2022-09-02 DIAGNOSIS — I5033 Acute on chronic diastolic (congestive) heart failure: Secondary | ICD-10-CM | POA: Diagnosis not present

## 2022-09-02 DIAGNOSIS — Z7901 Long term (current) use of anticoagulants: Secondary | ICD-10-CM | POA: Diagnosis not present

## 2022-09-02 DIAGNOSIS — E43 Unspecified severe protein-calorie malnutrition: Secondary | ICD-10-CM | POA: Diagnosis not present

## 2022-09-02 DIAGNOSIS — J9601 Acute respiratory failure with hypoxia: Secondary | ICD-10-CM | POA: Diagnosis not present

## 2022-09-02 DIAGNOSIS — E890 Postprocedural hypothyroidism: Secondary | ICD-10-CM | POA: Diagnosis not present

## 2022-09-02 DIAGNOSIS — Z6823 Body mass index (BMI) 23.0-23.9, adult: Secondary | ICD-10-CM | POA: Diagnosis not present

## 2022-09-02 DIAGNOSIS — J441 Chronic obstructive pulmonary disease with (acute) exacerbation: Secondary | ICD-10-CM | POA: Diagnosis not present

## 2022-09-02 DIAGNOSIS — N179 Acute kidney failure, unspecified: Secondary | ICD-10-CM | POA: Diagnosis not present

## 2022-09-02 DIAGNOSIS — Z9981 Dependence on supplemental oxygen: Secondary | ICD-10-CM | POA: Diagnosis not present

## 2022-09-02 NOTE — Progress Notes (Unsigned)
Office Visit    [name] Date of Encounter: 09/03/2022  Primary Care Provider:  Harlan Stains, MD Primary Cardiologist:  None Primary Electrophysiologist: None  Chief Complaint    Erin Ryan is a 88 y.o. female with PMH of HTN, HLD, CKD, mild COPD, GERD, A-fib, hypothyroidism, chronic dyspnea, hearing loss, anxiety who presents today for posthospital follow-up of AF with RVR.  Past Medical History    Past Medical History:  Diagnosis Date   Anxiety    Arthritis    COVID-19    04/2021   GERD (gastroesophageal reflux disease)    Hearing loss    has hearing aids   High cholesterol    Hypertension    Hypothyroid    Osteoporosis    Scoliosis    Thyroid disease    Past Surgical History:  Procedure Laterality Date   CATARACT EXTRACTION     cysts removed from bilateral breasts     THYROIDECTOMY      Allergies  Allergies  Allergen Reactions   Ambrosia Trifida (Tall Ragweed) Allergy Skin Test Other (See Comments)    Other reaction(s): Other   Hydrochlorothiazide     hyponatremia   Lyrica [Pregabalin] Other (See Comments)    Severe confusion   Diltiazem Hcl Er Rash   Short Ragweed Pollen Ext     Other reaction(s): Other    History of Present Illness    Erin Ryan  is a 88 year old female with the above mention past medical history who presents today for recent admission for AF with RVR.  Erin Ryan was initially seen by Erin Ryan in 01/2021 for complaint of shortness of breath.  She was followed by Erin Ryan and diagnosed with COPD in 2018.  BNP was checked and 2D echo completed that was unremarkable and BNP elevated to 1216.  During visit O2 sats were in the 90s and heart rate sometimes elevated to 112.  She was given a couple of days of Lasix and amlodipine was switched to Cardizem.  She unfortunately developed a rash with Cardizem and this was discontinued and switched to metoprolol.  She was taken off of ARB due to cough experienced  edema with Norvasc.  She was last seen by Erin Ryan 09/2021 for follow-up of palpitations.  Visit patient's blood pressures were well-controlled and she denied any presyncope or syncope.  She was being evaluated for dementia by MRI and no medication changes were made at that time.    She was seen in the ED on 08/12/2022 with A-fib with RVR.  2D echo was completed showing LVEF 55-60%, no RWMA, mild LVH, grade II DD, low normal RV, mildly elevated PSAP with RVSP 36.9 mmHg, mild MR, moderate AI.  Blood pressure was noted to be well-controlled.  She was given IV bolus of metoprolol and spontaneously converted to sinus rhythm.  CT of the chest was completed and showed acute PE with small thrombus burden subsegmental branch in the lower lobe.  There was also noted RV strain with a ratio of 1:4.  She was admitted and started on heparin drip and antibiotics for UTI.  She developed recurrent AF with RVR and given bolus of metoprolol.  She was started on amiodarone drip by hospitalist.  She converted to sinus with frequent PACs.  She had a recurrence of AF with RVR when getting up to the commode but quickly went back to sinus rhythm spontaneously.  She was planned to transition to p.o. around 20 mg  twice daily x 7 days and then 200 mg daily due to allergy to Cardizem.  She was also discharged with Eliquis 2.5 mg twice daily due to age and AF with RVR.  Metoprolol was increased to 75 mg twice daily.  Plan to discontinue anticoagulation following 6 months due to acute PE of patient's dementia and advanced age.  Erin Ryan presents today with her daughter for post hospital follow-up.  Since last being seen in the office patient reports she has been doing well with no new cardiac complaints.  She is tolerating her medications without any side effects.  Her blood pressure however today is elevated initially at 150/60 and 156/64 on recheck.  She notes that her pressure may be elevated due to rushing for her appointment  today.  EKG was obtained today showing sinus bradycardia with rate control with 56 bpm.  She was euvolemic on exam today and had no edema or swelling. During our visit we discussed current treatment plan and new medications started during her hospitalization.  She was advised to contact us if she has any I was able to answer questions to her satisfaction.  Patient denies chest pain, palpitations, dyspnea, PND, orthopnea, nausea, vomiting, dizziness, syncope, edema, weight gain, or early satiety.   Home Medications    Current Outpatient Medications  Medication Sig Dispense Refill   acetaminophen (TYLENOL) 325 MG tablet Take 2 tablets (650 mg total) by mouth every 6 (six) hours as needed for mild pain (or Fever >/= 101).     amiodarone (PACERONE) 200 MG tablet Take 1 tablet (200 mg total) by mouth 2 (two) times daily for 2 days, THEN 1 tablet (200 mg total) daily. 64 tablet 1   amLODipine (NORVASC) 2.5 MG tablet Take 2.5 mg by mouth daily.     apixaban (ELIQUIS) 2.5 MG TABS tablet Take 1 tablet (2.5 mg total) by mouth 2 (two) times daily. 60 tablet 1   atorvastatin (LIPITOR) 40 MG tablet Take 40 mg by mouth daily.     Calcium Citrate-Vitamin D (CALCIUM CITRATE + PO) Take 1 tablet 2 (two) times daily by mouth.     denosumab (PROLIA) 60 MG/ML SOSY injection Inject 60 mg into the skin every 6 (six) months.     Diclofenac Sodium (VOLTAREN EX) Apply 1 Application topically as needed (pain).     donepezil (ARICEPT) 10 MG tablet Take 1 tablet (10 mg total) by mouth at bedtime. 90 tablet 3   DULoxetine (CYMBALTA) 30 MG capsule Take 30 mg by mouth daily.     feeding supplement (ENSURE ENLIVE / ENSURE PLUS) LIQD Take 237 mLs by mouth daily. 237 mL 12   furosemide (LASIX) 20 MG tablet TAKE 1 TABLET BY MOUTH EVERY OTHER DAY 45 tablet 5   gabapentin (NEURONTIN) 100 MG capsule Take 100 mg by mouth at bedtime.     lactose free nutrition (BOOST PLUS) LIQD Take 237 mLs by mouth 2 (two) times daily between meals.  0    levalbuterol (XOPENEX) 0.63 MG/3ML nebulizer solution Take 3 mLs (0.63 mg total) by nebulization every 6 (six) hours as needed for wheezing or shortness of breath. 3 mL 12   levothyroxine (SYNTHROID) 100 MCG tablet Take 1 tablet (100 mcg total) by mouth in the morning. DAW1 Synthroid 30 tablet 1   metoprolol tartrate 75 MG TABS Take 75 mg by mouth 2 (two) times daily. 60 tablet 1   mirtazapine (REMERON) 15 MG tablet Take 15 mg by mouth at bedtime.  mometasone-formoterol (DULERA) 200-5 MCG/ACT AERO Inhale 2 puffs into the lungs 2 (two) times daily. 1 each    Multiple Vitamins-Minerals (CENTRUM SILVER PO) Take 1 tablet by mouth daily.     omeprazole (PRILOSEC) 40 MG capsule Take 1 capsule (40 mg total) by mouth 2 (two) times daily before a meal. 180 capsule 3   polyvinyl alcohol (LIQUIFILM TEARS) 1.4 % ophthalmic solution Place 1 drop into both eyes as needed for dry eyes. 15 mL 0   umeclidinium bromide (INCRUSE ELLIPTA) 62.5 MCG/ACT AEPB Inhale 1 puff into the lungs daily.     No current facility-administered medications for this visit.     Review of Systems  Please see the history of present illness.    (+) Mild dementia (+)   All other systems reviewed and are otherwise negative except as noted above.  Physical Exam    Wt Readings from Last 3 Encounters:  09/03/22 124 lb (56.2 kg)  08/20/22 126 lb 5.2 oz (57.3 kg)  05/20/22 120 lb (54.4 kg)   VS: Vitals:   09/03/22 1335 09/03/22 1410  BP: (!) 150/60 (!) 156/64  Pulse: (!) 56   ,Body mass index is 22.68 kg/m.  Constitutional:      Appearance: Healthy appearance. Not in distress.  Neck:     Vascular: JVD normal.  Pulmonary:     Effort: Pulmonary effort is normal.     Breath sounds: No wheezing. No rales. Diminished in the bases Cardiovascular:     Normal rate. Regular rhythm. Normal S1. Normal S2.      Murmurs: There is no murmur.  Edema:    Peripheral edema absent.  Abdominal:     Palpations: Abdomen is soft non  tender. There is no hepatomegaly.  Skin:    General: Skin is warm and dry.  Neurological:     General: No focal deficit present.     Mental Status: Alert and oriented to person, place and time.     Cranial Nerves: Cranial nerves are intact.  EKG/LABS/Other Studies Reviewed    ECG personally reviewed by me today -sinus bradycardia with rate of 56 bpm and no acute changes consistent with previous EKG.  Risk Assessment/Calculations:    CHA2DS2-VASc Score = 5   This indicates a 7.2% annual risk of stroke. The patient's score is based upon: CHF History: 1 HTN History: 1 Diabetes History: 0 Stroke History: 0 Vascular Disease History: 0 Age Score: 2 Gender Score: 1     Lab Results  Component Value Date   WBC 8.6 08/16/2022   HGB 11.4 (L) 08/16/2022   HCT 36.7 08/16/2022   MCV 90.6 08/16/2022   PLT 320 08/16/2022   Lab Results  Component Value Date   CREATININE 1.30 (H) 08/19/2022   BUN 39 (H) 08/19/2022   NA 140 08/19/2022   K 5.0 08/19/2022   CL 106 08/19/2022   CO2 24 08/19/2022   Lab Results  Component Value Date   ALT 18 08/10/2022   AST 28 08/10/2022   ALKPHOS 64 08/10/2022   BILITOT 0.4 08/10/2022   No results found for: "CHOL", "HDL", "LDLCALC", "LDLDIRECT", "TRIG", "CHOLHDL"  No results found for: "HGBA1C"  Assessment & Plan    1.  A-fib with RVR: -Patient currently on amiodarone 20 mg daily for rate control due to allergy to Cardizem and metoprolol 75 mg twice daily -EKG completed today showing patient is in rate controlled sinus bradycardia -She is currently on Eliquis 2.5 mg due to subsequent acute PE  discontinue following 6 months due to history of dementia and advanced age -She reports no bleeding complications with Eliquis. -Today patient reports no palpitations or shortness of breath. -CHA2DS2-VASc Score = 5 [CHF History: 1, HTN History: 1, Diabetes History: 0, Stroke History: 0, Vascular Disease History: 0, Age Score: 2, Gender Score: 1].   Therefore, the patient's annual risk of stroke is 7.2 %.      2.  Chronic diastolic heart failure: -Most recent 2D echo EF of 55 to 60%, no RWMA with grade 2 DD -Exacerbation of CHF following AF with RVR exacerbation. -Today patient is euvolemic on examination and reports no shortness of breath. -She reports no indiscretions with salt and has been compliant with her current medication regimen. -Low sodium diet, fluid restriction <2L, and daily weights encouraged. Educated to contact our office for weight gain of 2 lbs overnight or 5 lbs in one week.  -Continue Lasix 20 mg daily  3.  Acute pulmonary embolism: -Chest CT with small thrombus burden in left lower lobe with RV strain and treated with heparin drip and anticoagulation with Eliquis 2.5 mg Patient-patient will continue Eliquis 2.5 mg for 6 months and discontinue  4.  Essential hypertension: -HYPERTENSION CONTROL Vitals:   09/03/22 1335 09/03/22 1410  BP: (!) 150/60 (!) 156/64    The patient's blood pressure is elevated above target today.  In order to address the patient's elevated BP: Blood pressure will be monitored at home to determine if medication changes need to be made.; Follow up with general cardiology has been recommended.   -Patient's blood pressure today was elevated and we will check over next few weeks with plan to increase amlodipine to 2.5 mg twice a day  5.  Mild mitral stenosis: -EF of 55 to 60%, NWMA, mild asymmetric LVH of the basal septal segment, grade 2 diastolic dysfunction  -Patient's blood pressure elevated today however euvolemic on examination.     Disposition: Follow-up with None or APP in 1 months     Medication Adjustments/Labs and Tests Ordered: Current medicines are reviewed at length with the patient today.  Concerns regarding medicines are outlined above.   Signed, Mable Fill, Marissa Nestle, NP 09/03/2022, 2:22 PM [facility]  Note:  This document was prepared  using Dragon voice recognition software and may include unintentional dictation errors.

## 2022-09-03 ENCOUNTER — Ambulatory Visit: Payer: Medicare Other | Attending: Nurse Practitioner | Admitting: Nurse Practitioner

## 2022-09-03 ENCOUNTER — Encounter: Payer: Self-pay | Admitting: Nurse Practitioner

## 2022-09-03 VITALS — BP 156/64 | HR 56 | Ht 62.0 in | Wt 124.0 lb

## 2022-09-03 DIAGNOSIS — I4891 Unspecified atrial fibrillation: Secondary | ICD-10-CM | POA: Diagnosis not present

## 2022-09-03 DIAGNOSIS — I5040 Unspecified combined systolic (congestive) and diastolic (congestive) heart failure: Secondary | ICD-10-CM | POA: Diagnosis not present

## 2022-09-03 DIAGNOSIS — I10 Essential (primary) hypertension: Secondary | ICD-10-CM | POA: Insufficient documentation

## 2022-09-03 DIAGNOSIS — I2699 Other pulmonary embolism without acute cor pulmonale: Secondary | ICD-10-CM | POA: Diagnosis not present

## 2022-09-03 DIAGNOSIS — I340 Nonrheumatic mitral (valve) insufficiency: Secondary | ICD-10-CM | POA: Diagnosis not present

## 2022-09-03 NOTE — Patient Instructions (Addendum)
Medication Instructions:  Your physician recommends that you continue on your current medications as directed. Please refer to the Current Medication list given to you today. *If you need a refill on your cardiac medications before your next appointment, please call your pharmacy*   Lab Work: None ordered If you have labs (blood work) drawn today and your tests are completely normal, you will receive your results only by: [place] (if you have MyChart) OR A paper copy in the mail If you have any lab test that is abnormal or we need to change your treatment, we will call you to review the results.   Testing/Procedures: None Ordered   Follow-Up: At [facility], you and your health needs are our priority.  As part of our continuing mission to provide you with exceptional heart care, we have created designated Provider Care Teams.  These Care Teams include your primary Cardiologist (physician) and Advanced Practice Providers (APPs -  Physician Assistants and Nurse Practitioners) who all work together to provide you with the care you need, when you need it.  We recommend signing up for the patient portal called "MyChart".  Sign up information is provided on this After Visit Summary.  MyChart is used to connect with patients for Virtual Visits (Telemedicine).  Patients are able to view lab/test results, encounter notes, upcoming appointments, etc.  Non-urgent messages can be sent to your provider as well.   To learn more about what you can do with MyChart, go to [email or web address].    Your next appointment:   1 month(s)  The format for your next appointment:   In Person  Provider:   Marijo File, MD   Other Instructions Check blood pressure daily.   Important Information About Sugar

## 2022-09-09 DIAGNOSIS — F03A4 Unspecified dementia, mild, with anxiety: Secondary | ICD-10-CM | POA: Diagnosis not present

## 2022-09-09 DIAGNOSIS — K219 Gastro-esophageal reflux disease without esophagitis: Secondary | ICD-10-CM | POA: Diagnosis not present

## 2022-09-09 DIAGNOSIS — I4891 Unspecified atrial fibrillation: Secondary | ICD-10-CM | POA: Diagnosis not present

## 2022-09-09 DIAGNOSIS — Z7901 Long term (current) use of anticoagulants: Secondary | ICD-10-CM | POA: Diagnosis not present

## 2022-09-09 DIAGNOSIS — J449 Chronic obstructive pulmonary disease, unspecified: Secondary | ICD-10-CM | POA: Diagnosis not present

## 2022-09-09 DIAGNOSIS — I509 Heart failure, unspecified: Secondary | ICD-10-CM | POA: Diagnosis not present

## 2022-09-09 DIAGNOSIS — N189 Chronic kidney disease, unspecified: Secondary | ICD-10-CM | POA: Diagnosis not present

## 2022-09-09 DIAGNOSIS — F419 Anxiety disorder, unspecified: Secondary | ICD-10-CM | POA: Diagnosis not present

## 2022-09-09 DIAGNOSIS — Z7982 Long term (current) use of aspirin: Secondary | ICD-10-CM | POA: Diagnosis not present

## 2022-09-09 DIAGNOSIS — I130 Hypertensive heart and chronic kidney disease with heart failure and stage 1 through stage 4 chronic kidney disease, or unspecified chronic kidney disease: Secondary | ICD-10-CM | POA: Diagnosis not present

## 2022-09-09 DIAGNOSIS — E890 Postprocedural hypothyroidism: Secondary | ICD-10-CM | POA: Diagnosis not present

## 2022-09-12 ENCOUNTER — Telehealth: Payer: Self-pay

## 2022-09-12 NOTE — Telephone Encounter (Signed)
Received B/P readings from [employer].Dr.Hochrein reviewed and advised to increase Norvasc to 5 mg daily.Unable to reach nursing staff at # [phone].No answer.No voice mail.Order faxed to [employer] at fax # [phone].

## 2022-09-16 ENCOUNTER — Telehealth: Payer: Self-pay | Admitting: Cardiology

## 2022-09-16 MED ORDER — AMLODIPINE BESYLATE 5 MG PO TABS: 5.0000 mg | ORAL_TABLET | Freq: Every day | ORAL | 3 refills | Status: DC

## 2022-09-16 NOTE — Telephone Encounter (Signed)
*  STAT* If patient is at the pharmacy, call can be transferred to refill team.   1. Which medications need to be refilled? (please list name of each medication and dose if known)   amLODipine (NORVASC) 5 MG tablet    2. Which pharmacy/location (including street and city if local pharmacy) is medication to be sent to? [id number] [place], [place] - [address]  3. Do they need a 30 day or 90 day supply? 30 day supply

## 2022-09-17 DIAGNOSIS — Z9849 Cataract extraction status, unspecified eye: Secondary | ICD-10-CM | POA: Diagnosis not present

## 2022-09-17 DIAGNOSIS — E0590 Thyrotoxicosis, unspecified without thyrotoxic crisis or storm: Secondary | ICD-10-CM | POA: Diagnosis not present

## 2022-09-17 DIAGNOSIS — E890 Postprocedural hypothyroidism: Secondary | ICD-10-CM | POA: Diagnosis not present

## 2022-09-17 DIAGNOSIS — I6782 Cerebral ischemia: Secondary | ICD-10-CM | POA: Diagnosis not present

## 2022-09-17 DIAGNOSIS — I2699 Other pulmonary embolism without acute cor pulmonale: Secondary | ICD-10-CM | POA: Diagnosis not present

## 2022-09-17 DIAGNOSIS — I130 Hypertensive heart and chronic kidney disease with heart failure and stage 1 through stage 4 chronic kidney disease, or unspecified chronic kidney disease: Secondary | ICD-10-CM | POA: Diagnosis not present

## 2022-09-17 DIAGNOSIS — K121 Other forms of stomatitis: Secondary | ICD-10-CM | POA: Diagnosis not present

## 2022-09-17 DIAGNOSIS — R339 Retention of urine, unspecified: Secondary | ICD-10-CM | POA: Diagnosis not present

## 2022-09-17 DIAGNOSIS — Z87440 Personal history of urinary (tract) infections: Secondary | ICD-10-CM | POA: Diagnosis not present

## 2022-09-17 DIAGNOSIS — E785 Hyperlipidemia, unspecified: Secondary | ICD-10-CM | POA: Diagnosis not present

## 2022-09-17 DIAGNOSIS — J9601 Acute respiratory failure with hypoxia: Secondary | ICD-10-CM | POA: Diagnosis not present

## 2022-09-17 DIAGNOSIS — G9341 Metabolic encephalopathy: Secondary | ICD-10-CM | POA: Diagnosis not present

## 2022-09-17 DIAGNOSIS — F0150 Vascular dementia without behavioral disturbance: Secondary | ICD-10-CM | POA: Diagnosis not present

## 2022-09-17 DIAGNOSIS — Z87311 Personal history of (healed) other pathological fracture: Secondary | ICD-10-CM | POA: Diagnosis not present

## 2022-09-17 DIAGNOSIS — I4891 Unspecified atrial fibrillation: Secondary | ICD-10-CM | POA: Diagnosis not present

## 2022-09-17 DIAGNOSIS — N184 Chronic kidney disease, stage 4 (severe): Secondary | ICD-10-CM | POA: Diagnosis not present

## 2022-09-17 DIAGNOSIS — E43 Unspecified severe protein-calorie malnutrition: Secondary | ICD-10-CM | POA: Diagnosis not present

## 2022-09-17 DIAGNOSIS — J441 Chronic obstructive pulmonary disease with (acute) exacerbation: Secondary | ICD-10-CM | POA: Diagnosis not present

## 2022-09-17 DIAGNOSIS — Z8701 Personal history of pneumonia (recurrent): Secondary | ICD-10-CM | POA: Diagnosis not present

## 2022-09-17 DIAGNOSIS — K219 Gastro-esophageal reflux disease without esophagitis: Secondary | ICD-10-CM | POA: Diagnosis not present

## 2022-09-17 DIAGNOSIS — M1990 Unspecified osteoarthritis, unspecified site: Secondary | ICD-10-CM | POA: Diagnosis not present

## 2022-09-17 DIAGNOSIS — I5033 Acute on chronic diastolic (congestive) heart failure: Secondary | ICD-10-CM | POA: Diagnosis not present

## 2022-09-17 DIAGNOSIS — H9190 Unspecified hearing loss, unspecified ear: Secondary | ICD-10-CM | POA: Diagnosis not present

## 2022-09-18 DIAGNOSIS — N184 Chronic kidney disease, stage 4 (severe): Secondary | ICD-10-CM | POA: Diagnosis not present

## 2022-09-18 DIAGNOSIS — I5033 Acute on chronic diastolic (congestive) heart failure: Secondary | ICD-10-CM | POA: Diagnosis not present

## 2022-09-18 DIAGNOSIS — J9601 Acute respiratory failure with hypoxia: Secondary | ICD-10-CM | POA: Diagnosis not present

## 2022-09-18 DIAGNOSIS — J441 Chronic obstructive pulmonary disease with (acute) exacerbation: Secondary | ICD-10-CM | POA: Diagnosis not present

## 2022-09-18 DIAGNOSIS — I130 Hypertensive heart and chronic kidney disease with heart failure and stage 1 through stage 4 chronic kidney disease, or unspecified chronic kidney disease: Secondary | ICD-10-CM | POA: Diagnosis not present

## 2022-09-18 DIAGNOSIS — I2699 Other pulmonary embolism without acute cor pulmonale: Secondary | ICD-10-CM | POA: Diagnosis not present

## 2022-09-19 DIAGNOSIS — J441 Chronic obstructive pulmonary disease with (acute) exacerbation: Secondary | ICD-10-CM | POA: Diagnosis not present

## 2022-09-19 DIAGNOSIS — J9601 Acute respiratory failure with hypoxia: Secondary | ICD-10-CM | POA: Diagnosis not present

## 2022-09-19 DIAGNOSIS — I2699 Other pulmonary embolism without acute cor pulmonale: Secondary | ICD-10-CM | POA: Diagnosis not present

## 2022-09-19 DIAGNOSIS — N184 Chronic kidney disease, stage 4 (severe): Secondary | ICD-10-CM | POA: Diagnosis not present

## 2022-09-19 DIAGNOSIS — I5033 Acute on chronic diastolic (congestive) heart failure: Secondary | ICD-10-CM | POA: Diagnosis not present

## 2022-09-19 DIAGNOSIS — I130 Hypertensive heart and chronic kidney disease with heart failure and stage 1 through stage 4 chronic kidney disease, or unspecified chronic kidney disease: Secondary | ICD-10-CM | POA: Diagnosis not present

## 2022-09-25 DIAGNOSIS — R54 Age-related physical debility: Secondary | ICD-10-CM | POA: Diagnosis not present

## 2022-09-25 DIAGNOSIS — I129 Hypertensive chronic kidney disease with stage 1 through stage 4 chronic kidney disease, or unspecified chronic kidney disease: Secondary | ICD-10-CM | POA: Diagnosis not present

## 2022-09-25 DIAGNOSIS — N183 Chronic kidney disease, stage 3 unspecified: Secondary | ICD-10-CM | POA: Diagnosis not present

## 2022-09-25 DIAGNOSIS — J441 Chronic obstructive pulmonary disease with (acute) exacerbation: Secondary | ICD-10-CM | POA: Diagnosis not present

## 2022-09-25 DIAGNOSIS — I130 Hypertensive heart and chronic kidney disease with heart failure and stage 1 through stage 4 chronic kidney disease, or unspecified chronic kidney disease: Secondary | ICD-10-CM | POA: Diagnosis not present

## 2022-09-25 DIAGNOSIS — I5033 Acute on chronic diastolic (congestive) heart failure: Secondary | ICD-10-CM | POA: Diagnosis not present

## 2022-09-25 DIAGNOSIS — J449 Chronic obstructive pulmonary disease, unspecified: Secondary | ICD-10-CM | POA: Diagnosis not present

## 2022-09-25 DIAGNOSIS — I480 Paroxysmal atrial fibrillation: Secondary | ICD-10-CM | POA: Diagnosis not present

## 2022-09-25 DIAGNOSIS — E039 Hypothyroidism, unspecified: Secondary | ICD-10-CM | POA: Diagnosis not present

## 2022-09-25 DIAGNOSIS — I5032 Chronic diastolic (congestive) heart failure: Secondary | ICD-10-CM | POA: Diagnosis not present

## 2022-09-25 DIAGNOSIS — G301 Alzheimer's disease with late onset: Secondary | ICD-10-CM | POA: Diagnosis not present

## 2022-09-25 DIAGNOSIS — J9601 Acute respiratory failure with hypoxia: Secondary | ICD-10-CM | POA: Diagnosis not present

## 2022-09-25 DIAGNOSIS — D6869 Other thrombophilia: Secondary | ICD-10-CM | POA: Diagnosis not present

## 2022-09-25 DIAGNOSIS — I2699 Other pulmonary embolism without acute cor pulmonale: Secondary | ICD-10-CM | POA: Diagnosis not present

## 2022-09-25 DIAGNOSIS — Z86711 Personal history of pulmonary embolism: Secondary | ICD-10-CM | POA: Diagnosis not present

## 2022-09-25 DIAGNOSIS — N184 Chronic kidney disease, stage 4 (severe): Secondary | ICD-10-CM | POA: Diagnosis not present

## 2022-09-26 ENCOUNTER — Telehealth: Payer: Self-pay | Admitting: Student

## 2022-09-26 MED ORDER — AMIODARONE HCL 200 MG PO TABS: 200.0000 mg | ORAL_TABLET | Freq: Every day | ORAL | 2 refills | Status: DC

## 2022-09-26 NOTE — Addendum Note (Signed)
Addended by: Sande Rives on: 09/26/2022 07:42 PM   Modules accepted: Orders

## 2022-09-26 NOTE — Telephone Encounter (Signed)
   Patient's daughter called Answering Service requesting a refill of her Amiodarone. Called and spoke with daughter. She states PCP refilled her other medications but said she could not refill her Amiodarone. She is on '200mg'$  once daily for atrial fibrillation. She was recently seen by Ambrose Pancoast, NP, on 09/03/2022 and was tolerating this well. Will send in refill of Amiodarone '200mg'$  daily to pharmacy as requested. Daughter appreciative of the help.  Darreld Mclean, PA-C 09/26/2022 5:47 PM

## 2022-09-29 DIAGNOSIS — I5033 Acute on chronic diastolic (congestive) heart failure: Secondary | ICD-10-CM | POA: Diagnosis not present

## 2022-09-29 DIAGNOSIS — J441 Chronic obstructive pulmonary disease with (acute) exacerbation: Secondary | ICD-10-CM | POA: Diagnosis not present

## 2022-09-29 DIAGNOSIS — J9601 Acute respiratory failure with hypoxia: Secondary | ICD-10-CM | POA: Diagnosis not present

## 2022-09-29 DIAGNOSIS — I2699 Other pulmonary embolism without acute cor pulmonale: Secondary | ICD-10-CM | POA: Diagnosis not present

## 2022-09-29 DIAGNOSIS — I130 Hypertensive heart and chronic kidney disease with heart failure and stage 1 through stage 4 chronic kidney disease, or unspecified chronic kidney disease: Secondary | ICD-10-CM | POA: Diagnosis not present

## 2022-09-29 DIAGNOSIS — M25561 Pain in right knee: Secondary | ICD-10-CM | POA: Diagnosis not present

## 2022-09-29 DIAGNOSIS — N184 Chronic kidney disease, stage 4 (severe): Secondary | ICD-10-CM | POA: Diagnosis not present

## 2022-09-30 DIAGNOSIS — N184 Chronic kidney disease, stage 4 (severe): Secondary | ICD-10-CM | POA: Diagnosis not present

## 2022-09-30 DIAGNOSIS — I130 Hypertensive heart and chronic kidney disease with heart failure and stage 1 through stage 4 chronic kidney disease, or unspecified chronic kidney disease: Secondary | ICD-10-CM | POA: Diagnosis not present

## 2022-09-30 DIAGNOSIS — J441 Chronic obstructive pulmonary disease with (acute) exacerbation: Secondary | ICD-10-CM | POA: Diagnosis not present

## 2022-09-30 DIAGNOSIS — J9601 Acute respiratory failure with hypoxia: Secondary | ICD-10-CM | POA: Diagnosis not present

## 2022-09-30 DIAGNOSIS — I2699 Other pulmonary embolism without acute cor pulmonale: Secondary | ICD-10-CM | POA: Diagnosis not present

## 2022-09-30 DIAGNOSIS — I5033 Acute on chronic diastolic (congestive) heart failure: Secondary | ICD-10-CM | POA: Diagnosis not present

## 2022-10-07 DIAGNOSIS — I130 Hypertensive heart and chronic kidney disease with heart failure and stage 1 through stage 4 chronic kidney disease, or unspecified chronic kidney disease: Secondary | ICD-10-CM | POA: Diagnosis not present

## 2022-10-07 DIAGNOSIS — J9601 Acute respiratory failure with hypoxia: Secondary | ICD-10-CM | POA: Diagnosis not present

## 2022-10-07 DIAGNOSIS — I2699 Other pulmonary embolism without acute cor pulmonale: Secondary | ICD-10-CM | POA: Diagnosis not present

## 2022-10-07 DIAGNOSIS — I5033 Acute on chronic diastolic (congestive) heart failure: Secondary | ICD-10-CM | POA: Diagnosis not present

## 2022-10-07 DIAGNOSIS — J441 Chronic obstructive pulmonary disease with (acute) exacerbation: Secondary | ICD-10-CM | POA: Diagnosis not present

## 2022-10-07 DIAGNOSIS — N184 Chronic kidney disease, stage 4 (severe): Secondary | ICD-10-CM | POA: Diagnosis not present

## 2022-10-09 ENCOUNTER — Telehealth: Payer: Self-pay | Admitting: Cardiology

## 2022-10-09 DIAGNOSIS — I130 Hypertensive heart and chronic kidney disease with heart failure and stage 1 through stage 4 chronic kidney disease, or unspecified chronic kidney disease: Secondary | ICD-10-CM | POA: Diagnosis not present

## 2022-10-09 DIAGNOSIS — I5033 Acute on chronic diastolic (congestive) heart failure: Secondary | ICD-10-CM | POA: Diagnosis not present

## 2022-10-09 DIAGNOSIS — N184 Chronic kidney disease, stage 4 (severe): Secondary | ICD-10-CM | POA: Diagnosis not present

## 2022-10-09 DIAGNOSIS — I2699 Other pulmonary embolism without acute cor pulmonale: Secondary | ICD-10-CM | POA: Diagnosis not present

## 2022-10-09 DIAGNOSIS — J9601 Acute respiratory failure with hypoxia: Secondary | ICD-10-CM | POA: Diagnosis not present

## 2022-10-09 DIAGNOSIS — J441 Chronic obstructive pulmonary disease with (acute) exacerbation: Secondary | ICD-10-CM | POA: Diagnosis not present

## 2022-10-09 NOTE — Telephone Encounter (Signed)
*  STAT* If patient is at the pharmacy, call can be transferred to refill team.   1. Which medications need to be refilled? (please list name of each medication and dose if known)   metoprolol tartrate 75 MG TABS   2. Which pharmacy/location (including street and city if local pharmacy) is medication to be sent to?  [id number] [place], [place] - [address]  3. Do they need a 30 day or 90 day supply?   90 day  Daughter stated patient is completely out of this medication.

## 2022-10-10 DIAGNOSIS — I130 Hypertensive heart and chronic kidney disease with heart failure and stage 1 through stage 4 chronic kidney disease, or unspecified chronic kidney disease: Secondary | ICD-10-CM | POA: Diagnosis not present

## 2022-10-10 DIAGNOSIS — N184 Chronic kidney disease, stage 4 (severe): Secondary | ICD-10-CM | POA: Diagnosis not present

## 2022-10-10 DIAGNOSIS — J441 Chronic obstructive pulmonary disease with (acute) exacerbation: Secondary | ICD-10-CM | POA: Diagnosis not present

## 2022-10-10 DIAGNOSIS — I5033 Acute on chronic diastolic (congestive) heart failure: Secondary | ICD-10-CM | POA: Diagnosis not present

## 2022-10-10 DIAGNOSIS — J9601 Acute respiratory failure with hypoxia: Secondary | ICD-10-CM | POA: Diagnosis not present

## 2022-10-10 DIAGNOSIS — I2699 Other pulmonary embolism without acute cor pulmonale: Secondary | ICD-10-CM | POA: Diagnosis not present

## 2022-10-10 MED ORDER — METOPROLOL TARTRATE 75 MG PO TABS: 75.0000 mg | ORAL_TABLET | Freq: Two times a day (BID) | ORAL | 1 refills | Status: DC

## 2022-10-13 DIAGNOSIS — I5033 Acute on chronic diastolic (congestive) heart failure: Secondary | ICD-10-CM | POA: Diagnosis not present

## 2022-10-13 DIAGNOSIS — I2699 Other pulmonary embolism without acute cor pulmonale: Secondary | ICD-10-CM | POA: Diagnosis not present

## 2022-10-13 DIAGNOSIS — J441 Chronic obstructive pulmonary disease with (acute) exacerbation: Secondary | ICD-10-CM | POA: Diagnosis not present

## 2022-10-13 DIAGNOSIS — I130 Hypertensive heart and chronic kidney disease with heart failure and stage 1 through stage 4 chronic kidney disease, or unspecified chronic kidney disease: Secondary | ICD-10-CM | POA: Diagnosis not present

## 2022-10-13 DIAGNOSIS — N184 Chronic kidney disease, stage 4 (severe): Secondary | ICD-10-CM | POA: Diagnosis not present

## 2022-10-13 DIAGNOSIS — J9601 Acute respiratory failure with hypoxia: Secondary | ICD-10-CM | POA: Diagnosis not present

## 2022-10-14 DIAGNOSIS — I2699 Other pulmonary embolism without acute cor pulmonale: Secondary | ICD-10-CM | POA: Diagnosis not present

## 2022-10-14 DIAGNOSIS — J9601 Acute respiratory failure with hypoxia: Secondary | ICD-10-CM | POA: Diagnosis not present

## 2022-10-14 DIAGNOSIS — N184 Chronic kidney disease, stage 4 (severe): Secondary | ICD-10-CM | POA: Diagnosis not present

## 2022-10-14 DIAGNOSIS — J441 Chronic obstructive pulmonary disease with (acute) exacerbation: Secondary | ICD-10-CM | POA: Diagnosis not present

## 2022-10-14 DIAGNOSIS — I5033 Acute on chronic diastolic (congestive) heart failure: Secondary | ICD-10-CM | POA: Diagnosis not present

## 2022-10-14 DIAGNOSIS — I130 Hypertensive heart and chronic kidney disease with heart failure and stage 1 through stage 4 chronic kidney disease, or unspecified chronic kidney disease: Secondary | ICD-10-CM | POA: Diagnosis not present

## 2022-10-14 NOTE — Progress Notes (Signed)
Cardiology Clinic Note   [name] Date of Encounter: 10/15/2022  Primary Care Provider:  [name], MD Primary Cardiologist:  Rollene Rotunda, MD  Patient Profile    Erin Ryan 89 year old female presents the clinic today for follow-up evaluation of her atrial fibrillation with RVR.  Past Medical History    Past Medical History:  Diagnosis Date   Anxiety    Arthritis    COVID-19    04/2021   GERD (gastroesophageal reflux disease)    Hearing loss    has hearing aids   High cholesterol    Hypertension    Hypothyroid    Osteoporosis    Scoliosis    Thyroid disease    Past Surgical History:  Procedure Laterality Date   CATARACT EXTRACTION     cysts removed from bilateral breasts     THYROIDECTOMY      Allergies  Allergies  Allergen Reactions   Ambrosia Trifida (Tall Ragweed) Allergy Skin Test Other (See Comments)    Other reaction(s): Other   Hydrochlorothiazide     hyponatremia   Lyrica [Pregabalin] Other (See Comments)    Severe confusion   Diltiazem Hcl Er Rash   Short Ragweed Pollen Ext     Other reaction(s): Other    History of Present Illness    Erin Ryan has a PMH of hyperlipidemia, hypertension, CKD, COPD, GERD, atrial fibrillation, hypothyroidism, chronic dyspnea, hearing loss, anxiety.  She was initially seen by Dr. Antoine Poche 5/22 for complaint of shortness of breath.  She follows  with Dr. Sharee Pimple and was diagnosed with COPD in 2018.  Her BNP was evaluated as well as echocardiogram ordered.  Her BNP was noted to be 1216.  Her oxygen saturation during her visit was noted to be in the 90s.  She was prescribed furosemide and her amlodipine was switched to Cardizem.  She developed a rash with Cardizem and was transitioned to metoprolol.  She was taken off of her ARB due to cough.  She was seen by Dr. Antoine Poche 1/23 for follow-up and review of palpitations.  Her blood pressure was well-controlled at that time.  She denied  presyncope and syncope.  She was in the process of being evaluated for dementia via MRI and no medication changes were made at that time.  She was seen in the emergency department 08/12/2022 for atrial fibrillation with RVR.  Her echocardiogram showed an EF of 55-60%, mild LVH, G2 DD and mildly elevated PSAP with RVSP 36.9 mmHg and moderate AI.  Her blood pressure was well-controlled.  She was given IV metoprolol and spontaneously converted to sinus rhythm.  CT of her chest showed acute PE with small thrombus burden in a segmental branch in the lower lobe.  She was admitted and started on heparin gtt. as well as antibiotics for UTI.  She developed recurrent atrial fibrillation with RVR and was given subsequent metoprolol bolus.  She was started on amiodarone gtt. by hospitalist.  She converted to sinus rhythm and was noted to have frequent P ACs.  She was discharged on apixaban 2.5 mg twice daily for age and weight.  Her metoprolol was increased to 75 mg twice daily.  Plan was made for discontinuing anticoagulation after 6 months due to acute PE for patient's dementia and advanced age.  She was seen in follow-up by Robin Searing, NP on 09/03/2022.  During that time she presented with her daughter.  She had no cardiac complaints.  She was tolerating her medications well  without side effects.  Her blood pressure was initially elevated at 150/60 and was noted to be 156/64 on recheck.  Her EKG showed sinus bradycardia.  She was euvolemic on exam.  Her hospitalization was reviewed.  She denied chest pain, dyspnea, lower extremity swelling, palpitations and weight gain.  She presents to the clinic today for follow-up evaluation and states she is doing okay.  She presents with her daughter.  She is hard of hearing.  We reviewed her recent hospitalization and atrial fibrillation.  They expressed understanding.  She reports compliance with her apixaban and denies bleeding issues.  She is not very physically active and  presents with a rollator today.  She was recently seen and evaluated by her dentist.  She was noted to have some exposed bone which she needs surgery for.  Her daughter reports that she will go for consultation.  We reviewed the importance of compliance with apixaban and I asked her to send [place] a preoperative cardiac clearance request.  I recommended that we wait the 6 months until stopping apixaban therapy.  She expressed understanding.  Today she denies chest pain, shortness of breath, lower extremity edema, fatigue, palpitations, melena, hematuria, hemoptysis, diaphoresis, weakness, presyncope, syncope, orthopnea, and PND.    Home Medications    Prior to Admission medications   Medication Sig Start Date End Date Taking? Authorizing Provider  acetaminophen (TYLENOL) 325 MG tablet Take 2 tablets (650 mg total) by mouth every 6 (six) hours as needed for mild pain (or Fever >/= 101). 08/19/22   [name], MD  amiodarone (PACERONE) 200 MG tablet Take 1 tablet (200 mg total) by mouth daily. 09/26/22   Marjie Skiff E, PA-C  amLODipine (NORVASC) 5 MG tablet Take 1 tablet (5 mg total) by mouth daily. 09/16/22   Rollene Rotunda, MD  apixaban (ELIQUIS) 2.5 MG TABS tablet Take 1 tablet (2.5 mg total) by mouth 2 (two) times daily. 08/19/22   [name], MD  atorvastatin (LIPITOR) 40 MG tablet Take 40 mg by mouth daily.    [provider]  Calcium Citrate-Vitamin D (CALCIUM CITRATE + PO) Take 1 tablet 2 (two) times daily by mouth.    [provider]  denosumab (PROLIA) 60 MG/ML SOSY injection Inject 60 mg into the skin every 6 (six) months. 06/24/17   [provider]  Diclofenac Sodium (VOLTAREN EX) Apply 1 Application topically as needed (pain).    [provider]  donepezil (ARICEPT) 10 MG tablet Take 1 tablet (10 mg total) by mouth at bedtime. 05/20/22   [name], NP  DULoxetine (CYMBALTA) 30 MG capsule Take 30 mg by mouth daily. 09/06/21    [provider]  feeding supplement (ENSURE ENLIVE / ENSURE PLUS) LIQD Take 237 mLs by mouth daily. 08/19/22   [name], MD  furosemide (LASIX) 20 MG tablet TAKE 1 TABLET BY MOUTH EVERY OTHER DAY 08/21/22   Jodelle Gross, NP  gabapentin (NEURONTIN) 100 MG capsule Take 100 mg by mouth at bedtime. 05/07/22   [provider]  lactose free nutrition (BOOST PLUS) LIQD Take 237 mLs by mouth 2 (two) times daily between meals. 08/19/22   [name], MD  levalbuterol Pauline Aus) 0.63 MG/3ML nebulizer solution Take 3 mLs (0.63 mg total) by nebulization every 6 (six) hours as needed for wheezing or shortness of breath. 08/19/22   [name], MD  levothyroxine (SYNTHROID) 100 MCG tablet Take 1 tablet (100 mcg total) by mouth in the morning. DAW1  Synthroid 08/19/22   [name], MD  Metoprolol Tartrate 75 MG TABS Take 1 tablet (75 mg total) by mouth 2 (two) times daily. 10/10/22   Rollene Rotunda, MD  mirtazapine (REMERON) 15 MG tablet Take 15 mg by mouth at bedtime. 10/08/21   [provider]  mometasone-formoterol (DULERA) 200-5 MCG/ACT AERO Inhale 2 puffs into the lungs 2 (two) times daily. 08/19/22   [name], MD  Multiple Vitamins-Minerals (CENTRUM SILVER PO) Take 1 tablet by mouth daily.    [provider]  omeprazole (PRILOSEC) 40 MG capsule Take 1 capsule (40 mg total) by mouth 2 (two) times daily before a meal. 09/11/17   [name], MD  polyvinyl alcohol (LIQUIFILM TEARS) 1.4 % ophthalmic solution Place 1 drop into both eyes as needed for dry eyes. 08/19/22   [name], MD  umeclidinium bromide (INCRUSE ELLIPTA) 62.5 MCG/ACT AEPB Inhale 1 puff into the lungs daily. 08/20/22   [name], MD    Family History    Family History  Problem Relation Age of Onset   Heart disease Mother 76       Heart attack   Bladder Cancer Mother    Heart disease Father    Asthma Father    Heart attack Brother         Age undetermined   She indicated that her mother is deceased. She indicated that her father is deceased. She indicated that her brother is alive.  Social History    Social History   Socioeconomic History   Marital status: Widowed    Spouse name: Not on file   Number of children: 1   Years of education: Not on file   Highest education level: Not on file  Occupational History   Occupation: Retired    Associate Professor: RETIRED    Comment: Metallurgist  Tobacco Use   Smoking status: Never   Smokeless tobacco: Never  Vaping Use   Vaping Use: Never used  Substance and Sexual Activity   Alcohol use: No   Drug use: No   Sexual activity: Not on file  Other Topics Concern   Not on file  Social History Narrative   Lives with daughter.    Has 1 daughter, [name], and 2 grandkids   Social Determinants of Health   Financial Resource Strain: Not on file  Food Insecurity: Unknown (08/15/2022)   Hunger Vital Sign    Worried About Running Out of Food in the Last Year: Patient refused    Ran Out of Food in the Last Year: Patient refused  Transportation Needs: Unknown (08/15/2022)   PRAPARE - Administrator, Civil Service (Medical): Patient refused    Lack of Transportation (Non-Medical): Patient refused  Physical Activity: Not on file  Stress: Not on file  Social Connections: Not on file  Intimate Partner Violence: Unknown (08/15/2022)   Humiliation, Afraid, Rape, and Kick questionnaire    Fear of Current or Ex-Partner: Patient refused    Emotionally Abused: Patient refused    Physically Abused: Patient refused    Sexually Abused: Patient refused     Review of Systems    General:  No chills, fever, night sweats or weight changes.  Cardiovascular:  No chest pain, dyspnea on exertion, edema, orthopnea, palpitations, paroxysmal nocturnal dyspnea. Dermatological: No rash, lesions/masses Respiratory: No cough, dyspnea Urologic: No hematuria, dysuria Abdominal:   No nausea,  vomiting, diarrhea, bright red blood per rectum, melena, or hematemesis Neurologic:  No visual  changes, wkns, changes in mental status. All other systems reviewed and are otherwise negative except as noted above.  Physical Exam    VS:  BP 118/64   Pulse (!) 58   Ht 5\' 2"  (1.575 m)   Wt 128 lb 9.6 oz (58.3 kg)   SpO2 95%   BMI 23.52 kg/m  , BMI Body mass index is 23.52 kg/m. GEN: Well nourished, well developed, in no acute distress. HEENT: normal. Neck: Supple, no JVD, carotid bruits, or masses. Cardiac: RRR, no murmurs, rubs, or gallops. No clubbing, cyanosis, edema.  Radials/DP/PT 2+ and equal bilaterally.  Respiratory:  Respirations regular and unlabored, clear to auscultation bilaterally. GI: Soft, nontender, nondistended, BS + x 4. MS: no deformity or atrophy. Skin: warm and dry, no rash. Neuro:  Strength and sensation are intact. Psych: Normal affect.  Accessory Clinical Findings    Recent Labs: 08/10/2022: ALT 18; B Natriuretic Peptide 523.6 08/11/2022: TSH <0.010 08/16/2022: Hemoglobin 11.4; Magnesium 2.2; Platelets 320 08/19/2022: BUN 39; Creatinine, Ser 1.30; Potassium 5.0; Sodium 140   Recent Lipid Panel No results found for: "CHOL", "TRIG", "HDL", "CHOLHDL", "VLDL", "LDLCALC", "LDLDIRECT"       ECG personally reviewed by me today-none today.  Echocardiogram 08/11/2022  IMPRESSIONS     1. Left ventricular ejection fraction, by estimation, is 55 to 60%. The  left ventricle has normal function. The left ventricle has no regional  wall motion abnormalities. There is mild asymmetric left ventricular  hypertrophy of the basal-septal segment.  Left ventricular diastolic parameters are consistent with Grade II  diastolic dysfunction (pseudonormalization). Elevated left ventricular  end-diastolic pressure.   2. Right ventricular systolic function is low normal. The right  ventricular size is mildly enlarged. There is mildly elevated pulmonary  artery systolic  pressure.   3. Right atrial size was mildly dilated.   4. The mitral valve is grossly normal. Mild mitral valve regurgitation.  Mild mitral stenosis. Moderate mitral annular calcification.   5. The aortic valve is tricuspid. There is mild thickening of the aortic  valve. Aortic valve regurgitation is moderate. Aortic valve sclerosis is  present, with no evidence of aortic valve stenosis.   FINDINGS   Left Ventricle: Left ventricular ejection fraction, by estimation, is 55  to 60%. The left ventricle has normal function. The left ventricle has no  regional wall motion abnormalities. The left ventricular internal cavity  size was normal in size. There is   mild asymmetric left ventricular hypertrophy of the basal-septal segment.  Left ventricular diastolic parameters are consistent with Grade II  diastolic dysfunction (pseudonormalization). Elevated left ventricular  end-diastolic pressure.   Right Ventricle: The right ventricular size is mildly enlarged. No  increase in right ventricular wall thickness. Right ventricular systolic  function is low normal. There is mildly elevated pulmonary artery systolic  pressure. The tricuspid regurgitant  velocity is 2.91 m/s, and with an assumed right atrial pressure of 3 mmHg,  the estimated right ventricular systolic pressure is 36.9 mmHg.   Left Atrium: Left atrial size was normal in size.   Right Atrium: Right atrial size was mildly dilated.   Pericardium: Trivial pericardial effusion is present. The pericardial  effusion is posterior to the left ventricle.   Mitral Valve: The mitral valve is grossly normal. There is mild thickening  of the mitral valve leaflet(s). Moderate mitral annular calcification  located on the posterior annulus. Mild mitral valve regurgitation. Mild  mitral valve stenosis. MV peak  gradient, 7.3 mmHg. The mean mitral valve  gradient is 4.0 mmHg with  average heart rate of 98 bpm.   Tricuspid Valve: The tricuspid  valve is grossly normal. Tricuspid valve  regurgitation is mild . No evidence of tricuspid stenosis.   Aortic Valve: The aortic valve is tricuspid. There is mild thickening of  the aortic valve. There is mild aortic valve annular calcification. Aortic  valve regurgitation is moderate. Aortic valve sclerosis is present, with  no evidence of aortic valve  stenosis.   Pulmonic Valve: The pulmonic valve was grossly normal. Pulmonic valve  regurgitation is not visualized. No evidence of pulmonic stenosis.   Aorta: The aortic root is normal in size and structure.   IAS/Shunts: The atrial septum is grossly normal.    Assessment & Plan   1.  Atrial fibrillation-heart rate today 58.  Compliant with apixaban.  Denies bleeding issues.  Previous plan to discontinue apixaban after 6 months due to dementia and patient's advanced age. Avoid triggers caffeine, chocolate, EtOH, dehydration etc. Continue apixaban, metoprolol  Combined systolic and diastolic CHF-weight stable.  Euvolemic. Continue daily weights-contact office with a weight increase of 2 to 3 pounds overnight or 5 pounds in 1 week Heart healthy low-sodium diet Elevate lower extremities when not active  Mitral valve regurgitation-no increased DOE or activity intolerance.  Echocardiogram 08/11/2022 showed EF of 55-60%, G2 DD, moderate mitral annular calcification, mild mitral regurgitation, mild mitral stenosis. Continue current medical therapy  Disposition: Follow-up with Dr. Antoine Poche or me in 4 months.   Thomasene Ripple. Chaela Branscum NP-C     10/15/2022, 2:15 PM [facility] [address] Office [phone] Fax [phone]    I spent 14 minutes examining this patient, reviewing medications, and using patient centered shared decision making involving her cardiac care.  Prior to her visit I spent greater than 20 minutes reviewing her past medical history,  medications, and prior cardiac tests.

## 2022-10-15 ENCOUNTER — Ambulatory Visit: Payer: Medicare Other | Attending: General Practice | Admitting: General Practice

## 2022-10-15 ENCOUNTER — Encounter: Payer: Self-pay | Admitting: General Practice

## 2022-10-15 VITALS — BP 118/64 | HR 58 | Ht 62.0 in | Wt 128.6 lb

## 2022-10-15 DIAGNOSIS — I4891 Unspecified atrial fibrillation: Secondary | ICD-10-CM | POA: Diagnosis not present

## 2022-10-15 DIAGNOSIS — I340 Nonrheumatic mitral (valve) insufficiency: Secondary | ICD-10-CM

## 2022-10-15 DIAGNOSIS — I5040 Unspecified combined systolic (congestive) and diastolic (congestive) heart failure: Secondary | ICD-10-CM | POA: Diagnosis not present

## 2022-10-15 MED ORDER — METOPROLOL TARTRATE 75 MG PO TABS: 75.0000 mg | ORAL_TABLET | Freq: Two times a day (BID) | ORAL | 6 refills | Status: DC

## 2022-10-15 MED ORDER — AMIODARONE HCL 200 MG PO TABS: 200.0000 mg | ORAL_TABLET | Freq: Every day | ORAL | 6 refills | Status: DC

## 2022-10-15 NOTE — Patient Instructions (Signed)
Medication Instructions:  The current medical regimen is effective;  continue present plan and medications as directed. Please refer to the Current Medication list given to you today.  *If you need a refill on your cardiac medications before your next appointment, please call your pharmacy*   Lab Work: NONE If you have labs (blood work) drawn today and your tests are completely normal, you will receive your results only by: [place] (if you have MyChart) OR A paper copy in the mail If you have any lab test that is abnormal or we need to change your treatment, we will call you to review the results.  Other Instructions Please try to avoid these triggers: Do not use any products that have nicotine or tobacco in them. These include cigarettes, e-cigarettes, and chewing tobacco. If you need help quitting, ask your doctor. Eat heart-healthy foods. Talk with your doctor about the right eating plan for you. Exercise regularly as told by your doctor. Stay hydrated Do not drink alcohol, Caffeine or chocolate. Lose weight if you are overweight. Do not use drugs, including cannabis  Follow-Up: At [facility], you and your health needs are our priority.  As part of our continuing mission to provide you with exceptional heart care, we have created designated Provider Care Teams.  These Care Teams include your primary Cardiologist (physician) and Advanced Practice Providers (APPs -  Physician Assistants and Nurse Practitioners) who all work together to provide you with the care you need, when you need it.  Your next appointment:   4 month(s)  Provider:   Minus Breeding, MD

## 2022-10-16 DIAGNOSIS — I2699 Other pulmonary embolism without acute cor pulmonale: Secondary | ICD-10-CM | POA: Diagnosis not present

## 2022-10-16 DIAGNOSIS — N184 Chronic kidney disease, stage 4 (severe): Secondary | ICD-10-CM | POA: Diagnosis not present

## 2022-10-16 DIAGNOSIS — I130 Hypertensive heart and chronic kidney disease with heart failure and stage 1 through stage 4 chronic kidney disease, or unspecified chronic kidney disease: Secondary | ICD-10-CM | POA: Diagnosis not present

## 2022-10-16 DIAGNOSIS — J9601 Acute respiratory failure with hypoxia: Secondary | ICD-10-CM | POA: Diagnosis not present

## 2022-10-16 DIAGNOSIS — I5033 Acute on chronic diastolic (congestive) heart failure: Secondary | ICD-10-CM | POA: Diagnosis not present

## 2022-10-16 DIAGNOSIS — J441 Chronic obstructive pulmonary disease with (acute) exacerbation: Secondary | ICD-10-CM | POA: Diagnosis not present

## 2022-10-17 DIAGNOSIS — I6782 Cerebral ischemia: Secondary | ICD-10-CM | POA: Diagnosis not present

## 2022-10-17 DIAGNOSIS — M1990 Unspecified osteoarthritis, unspecified site: Secondary | ICD-10-CM | POA: Diagnosis not present

## 2022-10-17 DIAGNOSIS — Z87440 Personal history of urinary (tract) infections: Secondary | ICD-10-CM | POA: Diagnosis not present

## 2022-10-17 DIAGNOSIS — H9190 Unspecified hearing loss, unspecified ear: Secondary | ICD-10-CM | POA: Diagnosis not present

## 2022-10-17 DIAGNOSIS — E0590 Thyrotoxicosis, unspecified without thyrotoxic crisis or storm: Secondary | ICD-10-CM | POA: Diagnosis not present

## 2022-10-17 DIAGNOSIS — K219 Gastro-esophageal reflux disease without esophagitis: Secondary | ICD-10-CM | POA: Diagnosis not present

## 2022-10-17 DIAGNOSIS — I5033 Acute on chronic diastolic (congestive) heart failure: Secondary | ICD-10-CM | POA: Diagnosis not present

## 2022-10-17 DIAGNOSIS — G9341 Metabolic encephalopathy: Secondary | ICD-10-CM | POA: Diagnosis not present

## 2022-10-17 DIAGNOSIS — N184 Chronic kidney disease, stage 4 (severe): Secondary | ICD-10-CM | POA: Diagnosis not present

## 2022-10-17 DIAGNOSIS — K121 Other forms of stomatitis: Secondary | ICD-10-CM | POA: Diagnosis not present

## 2022-10-17 DIAGNOSIS — E890 Postprocedural hypothyroidism: Secondary | ICD-10-CM | POA: Diagnosis not present

## 2022-10-17 DIAGNOSIS — E43 Unspecified severe protein-calorie malnutrition: Secondary | ICD-10-CM | POA: Diagnosis not present

## 2022-10-17 DIAGNOSIS — I2699 Other pulmonary embolism without acute cor pulmonale: Secondary | ICD-10-CM | POA: Diagnosis not present

## 2022-10-17 DIAGNOSIS — J9601 Acute respiratory failure with hypoxia: Secondary | ICD-10-CM | POA: Diagnosis not present

## 2022-10-17 DIAGNOSIS — R339 Retention of urine, unspecified: Secondary | ICD-10-CM | POA: Diagnosis not present

## 2022-10-17 DIAGNOSIS — F0150 Vascular dementia without behavioral disturbance: Secondary | ICD-10-CM | POA: Diagnosis not present

## 2022-10-17 DIAGNOSIS — I130 Hypertensive heart and chronic kidney disease with heart failure and stage 1 through stage 4 chronic kidney disease, or unspecified chronic kidney disease: Secondary | ICD-10-CM | POA: Diagnosis not present

## 2022-10-17 DIAGNOSIS — Z87311 Personal history of (healed) other pathological fracture: Secondary | ICD-10-CM | POA: Diagnosis not present

## 2022-10-17 DIAGNOSIS — I4891 Unspecified atrial fibrillation: Secondary | ICD-10-CM | POA: Diagnosis not present

## 2022-10-17 DIAGNOSIS — E785 Hyperlipidemia, unspecified: Secondary | ICD-10-CM | POA: Diagnosis not present

## 2022-10-17 DIAGNOSIS — Z8701 Personal history of pneumonia (recurrent): Secondary | ICD-10-CM | POA: Diagnosis not present

## 2022-10-17 DIAGNOSIS — J441 Chronic obstructive pulmonary disease with (acute) exacerbation: Secondary | ICD-10-CM | POA: Diagnosis not present

## 2022-10-17 DIAGNOSIS — Z9849 Cataract extraction status, unspecified eye: Secondary | ICD-10-CM | POA: Diagnosis not present

## 2022-10-21 DIAGNOSIS — J441 Chronic obstructive pulmonary disease with (acute) exacerbation: Secondary | ICD-10-CM | POA: Diagnosis not present

## 2022-10-21 DIAGNOSIS — I5033 Acute on chronic diastolic (congestive) heart failure: Secondary | ICD-10-CM | POA: Diagnosis not present

## 2022-10-21 DIAGNOSIS — I130 Hypertensive heart and chronic kidney disease with heart failure and stage 1 through stage 4 chronic kidney disease, or unspecified chronic kidney disease: Secondary | ICD-10-CM | POA: Diagnosis not present

## 2022-10-21 DIAGNOSIS — I2699 Other pulmonary embolism without acute cor pulmonale: Secondary | ICD-10-CM | POA: Diagnosis not present

## 2022-10-21 DIAGNOSIS — J9601 Acute respiratory failure with hypoxia: Secondary | ICD-10-CM | POA: Diagnosis not present

## 2022-10-21 DIAGNOSIS — N184 Chronic kidney disease, stage 4 (severe): Secondary | ICD-10-CM | POA: Diagnosis not present

## 2022-10-23 DIAGNOSIS — J441 Chronic obstructive pulmonary disease with (acute) exacerbation: Secondary | ICD-10-CM | POA: Diagnosis not present

## 2022-10-23 DIAGNOSIS — N184 Chronic kidney disease, stage 4 (severe): Secondary | ICD-10-CM | POA: Diagnosis not present

## 2022-10-23 DIAGNOSIS — I5033 Acute on chronic diastolic (congestive) heart failure: Secondary | ICD-10-CM | POA: Diagnosis not present

## 2022-10-23 DIAGNOSIS — J9601 Acute respiratory failure with hypoxia: Secondary | ICD-10-CM | POA: Diagnosis not present

## 2022-10-23 DIAGNOSIS — I2699 Other pulmonary embolism without acute cor pulmonale: Secondary | ICD-10-CM | POA: Diagnosis not present

## 2022-10-23 DIAGNOSIS — I130 Hypertensive heart and chronic kidney disease with heart failure and stage 1 through stage 4 chronic kidney disease, or unspecified chronic kidney disease: Secondary | ICD-10-CM | POA: Diagnosis not present

## 2022-10-24 ENCOUNTER — Telehealth: Payer: Self-pay | Admitting: Internal Medicine

## 2022-10-24 DIAGNOSIS — I130 Hypertensive heart and chronic kidney disease with heart failure and stage 1 through stage 4 chronic kidney disease, or unspecified chronic kidney disease: Secondary | ICD-10-CM | POA: Diagnosis not present

## 2022-10-24 DIAGNOSIS — I2699 Other pulmonary embolism without acute cor pulmonale: Secondary | ICD-10-CM | POA: Diagnosis not present

## 2022-10-24 DIAGNOSIS — J441 Chronic obstructive pulmonary disease with (acute) exacerbation: Secondary | ICD-10-CM | POA: Diagnosis not present

## 2022-10-24 DIAGNOSIS — N184 Chronic kidney disease, stage 4 (severe): Secondary | ICD-10-CM | POA: Diagnosis not present

## 2022-10-24 DIAGNOSIS — J9601 Acute respiratory failure with hypoxia: Secondary | ICD-10-CM | POA: Diagnosis not present

## 2022-10-24 DIAGNOSIS — I5033 Acute on chronic diastolic (congestive) heart failure: Secondary | ICD-10-CM | POA: Diagnosis not present

## 2022-10-24 NOTE — Telephone Encounter (Signed)
Patient's daughter called the cardiology after hours line discuss mother, Erin Ryan.  Mrs. Economou is reportedly having some swelling in her ankles that's new from prior.  Patient takes furosemide three times a week.  I informed daughter to see if she still has swelling the next day due to potential edema being secondary to dependent edema.  If swelling is noted tomorrow morning, daughter is to give an extra dose of home furosemide and call for further management.Marland Kitchen

## 2022-10-27 ENCOUNTER — Telehealth: Payer: Self-pay | Admitting: General Practice

## 2022-10-27 DIAGNOSIS — I2699 Other pulmonary embolism without acute cor pulmonale: Secondary | ICD-10-CM | POA: Diagnosis not present

## 2022-10-27 DIAGNOSIS — N184 Chronic kidney disease, stage 4 (severe): Secondary | ICD-10-CM | POA: Diagnosis not present

## 2022-10-27 DIAGNOSIS — J9601 Acute respiratory failure with hypoxia: Secondary | ICD-10-CM | POA: Diagnosis not present

## 2022-10-27 DIAGNOSIS — J441 Chronic obstructive pulmonary disease with (acute) exacerbation: Secondary | ICD-10-CM | POA: Diagnosis not present

## 2022-10-27 DIAGNOSIS — I5033 Acute on chronic diastolic (congestive) heart failure: Secondary | ICD-10-CM | POA: Diagnosis not present

## 2022-10-27 DIAGNOSIS — I130 Hypertensive heart and chronic kidney disease with heart failure and stage 1 through stage 4 chronic kidney disease, or unspecified chronic kidney disease: Secondary | ICD-10-CM | POA: Diagnosis not present

## 2022-10-27 NOTE — Telephone Encounter (Signed)
Spoke to patient 's daughter. Erin Ryan. She states patient had physical therapist  there today. - weight was 127 lbs.   Daughter states she has not weighed patient daily. Her last known weight is 125 lbs. Daughter states she is not edematous today .  She did call the office on Friday due to patient having swollen feet last week.  She received verbal orders if patient was still swollen to give her a extra dose on Saturday.   Daughter states patient did not have swelling the next day so patient did not receive extra dose.   RN informed  Erin Ryan to continue to monitor her mothers weight.  Weight here daily  if she gains 3 lbs or more she may give an extra dose.   Erin Ryan verbalized understanding.

## 2022-10-27 NOTE — Telephone Encounter (Signed)
Pt c/o swelling: STAT is pt has developed SOB within 24 hours  If swelling, where is the swelling located? little swelling  in her ankles   weight have you gained and in what time span? 2 lbs since last Thursday  Have you gained 3 pounds in a day or 5 pounds in a week? no  Do you have a log of your daily weights (if so, list)?   Are you currently taking a fluid pill? yes  Are you currently SOB? not noticeable  Have you traveled recently? no

## 2022-10-31 DIAGNOSIS — I5033 Acute on chronic diastolic (congestive) heart failure: Secondary | ICD-10-CM | POA: Diagnosis not present

## 2022-10-31 DIAGNOSIS — N184 Chronic kidney disease, stage 4 (severe): Secondary | ICD-10-CM | POA: Diagnosis not present

## 2022-10-31 DIAGNOSIS — I2699 Other pulmonary embolism without acute cor pulmonale: Secondary | ICD-10-CM | POA: Diagnosis not present

## 2022-10-31 DIAGNOSIS — I130 Hypertensive heart and chronic kidney disease with heart failure and stage 1 through stage 4 chronic kidney disease, or unspecified chronic kidney disease: Secondary | ICD-10-CM | POA: Diagnosis not present

## 2022-10-31 DIAGNOSIS — J9601 Acute respiratory failure with hypoxia: Secondary | ICD-10-CM | POA: Diagnosis not present

## 2022-10-31 DIAGNOSIS — J441 Chronic obstructive pulmonary disease with (acute) exacerbation: Secondary | ICD-10-CM | POA: Diagnosis not present

## 2022-11-03 DIAGNOSIS — I130 Hypertensive heart and chronic kidney disease with heart failure and stage 1 through stage 4 chronic kidney disease, or unspecified chronic kidney disease: Secondary | ICD-10-CM | POA: Diagnosis not present

## 2022-11-03 DIAGNOSIS — J441 Chronic obstructive pulmonary disease with (acute) exacerbation: Secondary | ICD-10-CM | POA: Diagnosis not present

## 2022-11-03 DIAGNOSIS — J9601 Acute respiratory failure with hypoxia: Secondary | ICD-10-CM | POA: Diagnosis not present

## 2022-11-03 DIAGNOSIS — I5033 Acute on chronic diastolic (congestive) heart failure: Secondary | ICD-10-CM | POA: Diagnosis not present

## 2022-11-03 DIAGNOSIS — M1711 Unilateral primary osteoarthritis, right knee: Secondary | ICD-10-CM | POA: Diagnosis not present

## 2022-11-03 DIAGNOSIS — I2699 Other pulmonary embolism without acute cor pulmonale: Secondary | ICD-10-CM | POA: Diagnosis not present

## 2022-11-03 DIAGNOSIS — N184 Chronic kidney disease, stage 4 (severe): Secondary | ICD-10-CM | POA: Diagnosis not present

## 2022-11-03 DIAGNOSIS — M1712 Unilateral primary osteoarthritis, left knee: Secondary | ICD-10-CM | POA: Diagnosis not present

## 2022-11-05 DIAGNOSIS — I2699 Other pulmonary embolism without acute cor pulmonale: Secondary | ICD-10-CM | POA: Diagnosis not present

## 2022-11-05 DIAGNOSIS — I5033 Acute on chronic diastolic (congestive) heart failure: Secondary | ICD-10-CM | POA: Diagnosis not present

## 2022-11-05 DIAGNOSIS — N184 Chronic kidney disease, stage 4 (severe): Secondary | ICD-10-CM | POA: Diagnosis not present

## 2022-11-05 DIAGNOSIS — I130 Hypertensive heart and chronic kidney disease with heart failure and stage 1 through stage 4 chronic kidney disease, or unspecified chronic kidney disease: Secondary | ICD-10-CM | POA: Diagnosis not present

## 2022-11-05 DIAGNOSIS — J441 Chronic obstructive pulmonary disease with (acute) exacerbation: Secondary | ICD-10-CM | POA: Diagnosis not present

## 2022-11-05 DIAGNOSIS — J9601 Acute respiratory failure with hypoxia: Secondary | ICD-10-CM | POA: Diagnosis not present

## 2022-11-06 DIAGNOSIS — I5033 Acute on chronic diastolic (congestive) heart failure: Secondary | ICD-10-CM | POA: Diagnosis not present

## 2022-11-06 DIAGNOSIS — J441 Chronic obstructive pulmonary disease with (acute) exacerbation: Secondary | ICD-10-CM | POA: Diagnosis not present

## 2022-11-06 DIAGNOSIS — I130 Hypertensive heart and chronic kidney disease with heart failure and stage 1 through stage 4 chronic kidney disease, or unspecified chronic kidney disease: Secondary | ICD-10-CM | POA: Diagnosis not present

## 2022-11-06 DIAGNOSIS — N184 Chronic kidney disease, stage 4 (severe): Secondary | ICD-10-CM | POA: Diagnosis not present

## 2022-11-06 DIAGNOSIS — J9601 Acute respiratory failure with hypoxia: Secondary | ICD-10-CM | POA: Diagnosis not present

## 2022-11-06 DIAGNOSIS — I2699 Other pulmonary embolism without acute cor pulmonale: Secondary | ICD-10-CM | POA: Diagnosis not present

## 2022-11-11 DIAGNOSIS — M810 Age-related osteoporosis without current pathological fracture: Secondary | ICD-10-CM | POA: Diagnosis not present

## 2022-11-12 DIAGNOSIS — I130 Hypertensive heart and chronic kidney disease with heart failure and stage 1 through stage 4 chronic kidney disease, or unspecified chronic kidney disease: Secondary | ICD-10-CM | POA: Diagnosis not present

## 2022-11-12 DIAGNOSIS — J441 Chronic obstructive pulmonary disease with (acute) exacerbation: Secondary | ICD-10-CM | POA: Diagnosis not present

## 2022-11-12 DIAGNOSIS — N184 Chronic kidney disease, stage 4 (severe): Secondary | ICD-10-CM | POA: Diagnosis not present

## 2022-11-12 DIAGNOSIS — J9601 Acute respiratory failure with hypoxia: Secondary | ICD-10-CM | POA: Diagnosis not present

## 2022-11-12 DIAGNOSIS — I5033 Acute on chronic diastolic (congestive) heart failure: Secondary | ICD-10-CM | POA: Diagnosis not present

## 2022-11-12 DIAGNOSIS — I2699 Other pulmonary embolism without acute cor pulmonale: Secondary | ICD-10-CM | POA: Diagnosis not present

## 2022-11-14 DIAGNOSIS — J9601 Acute respiratory failure with hypoxia: Secondary | ICD-10-CM | POA: Diagnosis not present

## 2022-11-14 DIAGNOSIS — J441 Chronic obstructive pulmonary disease with (acute) exacerbation: Secondary | ICD-10-CM | POA: Diagnosis not present

## 2022-11-14 DIAGNOSIS — I2699 Other pulmonary embolism without acute cor pulmonale: Secondary | ICD-10-CM | POA: Diagnosis not present

## 2022-11-14 DIAGNOSIS — N184 Chronic kidney disease, stage 4 (severe): Secondary | ICD-10-CM | POA: Diagnosis not present

## 2022-11-14 DIAGNOSIS — I5033 Acute on chronic diastolic (congestive) heart failure: Secondary | ICD-10-CM | POA: Diagnosis not present

## 2022-11-14 DIAGNOSIS — I130 Hypertensive heart and chronic kidney disease with heart failure and stage 1 through stage 4 chronic kidney disease, or unspecified chronic kidney disease: Secondary | ICD-10-CM | POA: Diagnosis not present

## 2022-11-16 DIAGNOSIS — E43 Unspecified severe protein-calorie malnutrition: Secondary | ICD-10-CM | POA: Diagnosis not present

## 2022-11-16 DIAGNOSIS — F0150 Vascular dementia without behavioral disturbance: Secondary | ICD-10-CM | POA: Diagnosis not present

## 2022-11-16 DIAGNOSIS — I6782 Cerebral ischemia: Secondary | ICD-10-CM | POA: Diagnosis not present

## 2022-11-16 DIAGNOSIS — K121 Other forms of stomatitis: Secondary | ICD-10-CM | POA: Diagnosis not present

## 2022-11-16 DIAGNOSIS — Z87440 Personal history of urinary (tract) infections: Secondary | ICD-10-CM | POA: Diagnosis not present

## 2022-11-16 DIAGNOSIS — E0590 Thyrotoxicosis, unspecified without thyrotoxic crisis or storm: Secondary | ICD-10-CM | POA: Diagnosis not present

## 2022-11-16 DIAGNOSIS — E785 Hyperlipidemia, unspecified: Secondary | ICD-10-CM | POA: Diagnosis not present

## 2022-11-16 DIAGNOSIS — G9341 Metabolic encephalopathy: Secondary | ICD-10-CM | POA: Diagnosis not present

## 2022-11-16 DIAGNOSIS — I4891 Unspecified atrial fibrillation: Secondary | ICD-10-CM | POA: Diagnosis not present

## 2022-11-16 DIAGNOSIS — Z9849 Cataract extraction status, unspecified eye: Secondary | ICD-10-CM | POA: Diagnosis not present

## 2022-11-16 DIAGNOSIS — I130 Hypertensive heart and chronic kidney disease with heart failure and stage 1 through stage 4 chronic kidney disease, or unspecified chronic kidney disease: Secondary | ICD-10-CM | POA: Diagnosis not present

## 2022-11-16 DIAGNOSIS — E890 Postprocedural hypothyroidism: Secondary | ICD-10-CM | POA: Diagnosis not present

## 2022-11-16 DIAGNOSIS — N184 Chronic kidney disease, stage 4 (severe): Secondary | ICD-10-CM | POA: Diagnosis not present

## 2022-11-16 DIAGNOSIS — H9190 Unspecified hearing loss, unspecified ear: Secondary | ICD-10-CM | POA: Diagnosis not present

## 2022-11-16 DIAGNOSIS — M1990 Unspecified osteoarthritis, unspecified site: Secondary | ICD-10-CM | POA: Diagnosis not present

## 2022-11-16 DIAGNOSIS — R339 Retention of urine, unspecified: Secondary | ICD-10-CM | POA: Diagnosis not present

## 2022-11-16 DIAGNOSIS — J9601 Acute respiratory failure with hypoxia: Secondary | ICD-10-CM | POA: Diagnosis not present

## 2022-11-16 DIAGNOSIS — I2699 Other pulmonary embolism without acute cor pulmonale: Secondary | ICD-10-CM | POA: Diagnosis not present

## 2022-11-16 DIAGNOSIS — Z87311 Personal history of (healed) other pathological fracture: Secondary | ICD-10-CM | POA: Diagnosis not present

## 2022-11-16 DIAGNOSIS — I5033 Acute on chronic diastolic (congestive) heart failure: Secondary | ICD-10-CM | POA: Diagnosis not present

## 2022-11-16 DIAGNOSIS — K219 Gastro-esophageal reflux disease without esophagitis: Secondary | ICD-10-CM | POA: Diagnosis not present

## 2022-11-16 DIAGNOSIS — J441 Chronic obstructive pulmonary disease with (acute) exacerbation: Secondary | ICD-10-CM | POA: Diagnosis not present

## 2022-11-16 DIAGNOSIS — Z8701 Personal history of pneumonia (recurrent): Secondary | ICD-10-CM | POA: Diagnosis not present

## 2022-11-20 DIAGNOSIS — I130 Hypertensive heart and chronic kidney disease with heart failure and stage 1 through stage 4 chronic kidney disease, or unspecified chronic kidney disease: Secondary | ICD-10-CM | POA: Diagnosis not present

## 2022-11-20 DIAGNOSIS — I4891 Unspecified atrial fibrillation: Secondary | ICD-10-CM | POA: Diagnosis not present

## 2022-11-20 DIAGNOSIS — J441 Chronic obstructive pulmonary disease with (acute) exacerbation: Secondary | ICD-10-CM | POA: Diagnosis not present

## 2022-11-20 DIAGNOSIS — I5033 Acute on chronic diastolic (congestive) heart failure: Secondary | ICD-10-CM | POA: Diagnosis not present

## 2022-11-20 DIAGNOSIS — N184 Chronic kidney disease, stage 4 (severe): Secondary | ICD-10-CM | POA: Diagnosis not present

## 2022-11-20 DIAGNOSIS — I2699 Other pulmonary embolism without acute cor pulmonale: Secondary | ICD-10-CM | POA: Diagnosis not present

## 2022-11-25 ENCOUNTER — Telehealth: Payer: Self-pay | Admitting: Cardiology

## 2022-11-25 NOTE — Telephone Encounter (Signed)
Spoke with patient who is following up because she has not gotten a call back. Advised message has been sent to MD for review. She has been taking lasix '20mg'$  every day since Sunday. Patient reports UOP is OK. Daughter reports she has a hard time getting her to eat - today she had a half of cheese sandwich. Her appetite has declined. Breathing is stable but she seems weaker. Daughter reports some LE edema.

## 2022-11-25 NOTE — Telephone Encounter (Signed)
Pt's daughter stated that she's still waiting on a callback from call this morning. Per notes, RN Almyra Free spoke with daughter but daughter states she hasn't spoke with anyone and is still waiting on a callback. Please advise.

## 2022-11-25 NOTE — Telephone Encounter (Signed)
Spoke with Dr. Gardiner Rhyme (DOD). Advised to increase lasix to '40mg'$  x2 days. Will need APP or DOD visit. OK for BMET this week if visit is pushed out to next week.   Spoke with patient's daughter and relayed this information. Scheduled for DOD on 3/8 at 3:30pm.

## 2022-11-25 NOTE — Telephone Encounter (Signed)
Called patient spoke with daughter (okay per DPR) advised that her mother has been having increased swelling the last few days. She normally takes Lasix 20 mg every other day, however they did give it to her 3 days straight this time, so she received an extra tablet this week. The swelling was improved, however when she weighed this morning she was 129 lb, she is not sob, and has no other symptoms to report. Patient daughter states she is aware she does eat cheese crackers- we did discuss salt can increase swelling. She will limit these and see if things improve. Advised I would also route a message to covering MD to see if any other increase in medication for a few days, with repeat BMET since patient does not have any recent lab work on file.   Thanks!

## 2022-11-25 NOTE — Telephone Encounter (Signed)
Pt c/o swelling: STAT is pt has developed SOB within 24 hours  How much weight have you gained and in what time span?  4 lbs in 2 days  If swelling, where is the swelling located?  Ankles, but they look better this morning   Are you currently taking a fluid pill?  Yes, furosemide--patient's daughter states the patient normally takes furosemide every other day, but she gave it to her 2 days in a row since Sunday  Are you currently SOB?  Not currently with the patient  Do you have a log of your daily weights (if so, list)?  3/03: 125 lbs 3/05: 129 lbs  Have you gained 3 pounds in a day or 5 pounds in a week?   Have you traveled recently?  No

## 2022-11-27 DIAGNOSIS — N184 Chronic kidney disease, stage 4 (severe): Secondary | ICD-10-CM | POA: Diagnosis not present

## 2022-11-27 DIAGNOSIS — I2699 Other pulmonary embolism without acute cor pulmonale: Secondary | ICD-10-CM | POA: Diagnosis not present

## 2022-11-27 DIAGNOSIS — I130 Hypertensive heart and chronic kidney disease with heart failure and stage 1 through stage 4 chronic kidney disease, or unspecified chronic kidney disease: Secondary | ICD-10-CM | POA: Diagnosis not present

## 2022-11-27 DIAGNOSIS — I5033 Acute on chronic diastolic (congestive) heart failure: Secondary | ICD-10-CM | POA: Diagnosis not present

## 2022-11-27 DIAGNOSIS — J441 Chronic obstructive pulmonary disease with (acute) exacerbation: Secondary | ICD-10-CM | POA: Diagnosis not present

## 2022-11-27 DIAGNOSIS — I4891 Unspecified atrial fibrillation: Secondary | ICD-10-CM | POA: Diagnosis not present

## 2022-11-28 ENCOUNTER — Encounter: Payer: Self-pay | Admitting: Cardiovascular Disease

## 2022-11-28 ENCOUNTER — Ambulatory Visit: Payer: Medicare Other | Attending: Cardiovascular Disease | Admitting: Cardiovascular Disease

## 2022-11-28 VITALS — BP 129/59 | HR 64 | Ht 62.0 in | Wt 130.8 lb

## 2022-11-28 DIAGNOSIS — E039 Hypothyroidism, unspecified: Secondary | ICD-10-CM | POA: Diagnosis not present

## 2022-11-28 DIAGNOSIS — N1832 Chronic kidney disease, stage 3b: Secondary | ICD-10-CM

## 2022-11-28 DIAGNOSIS — I5032 Chronic diastolic (congestive) heart failure: Secondary | ICD-10-CM | POA: Diagnosis not present

## 2022-11-28 DIAGNOSIS — Z86711 Personal history of pulmonary embolism: Secondary | ICD-10-CM | POA: Diagnosis not present

## 2022-11-28 DIAGNOSIS — I10 Essential (primary) hypertension: Secondary | ICD-10-CM | POA: Diagnosis not present

## 2022-11-28 DIAGNOSIS — D6869 Other thrombophilia: Secondary | ICD-10-CM | POA: Diagnosis not present

## 2022-11-28 DIAGNOSIS — I4891 Unspecified atrial fibrillation: Secondary | ICD-10-CM | POA: Diagnosis not present

## 2022-11-28 MED ORDER — METOPROLOL TARTRATE 75 MG PO TABS: 75.0000 mg | ORAL_TABLET | Freq: Two times a day (BID) | ORAL | 6 refills | Status: DC

## 2022-11-28 MED ORDER — FUROSEMIDE 20 MG PO TABS: 20.0000 mg | ORAL_TABLET | Freq: Every day | ORAL | 3 refills | Status: DC

## 2022-11-28 MED ORDER — APIXABAN 2.5 MG PO TABS: 2.5000 mg | ORAL_TABLET | Freq: Two times a day (BID) | ORAL | 3 refills | Status: DC

## 2022-11-28 NOTE — Patient Instructions (Addendum)
Medication Instructions:  Stop taking Amiodarone Take Furosemide (Lasix) '40mg'$  on Saturday and Sunday- Monday- Start taking Lasix '20mg'$  a day *If you need a refill on your cardiac medications before your next appointment, please call your pharmacy*  Lab Work: BMET- 3 weeks If you have labs (blood work) drawn today and your tests are completely normal, you will receive your results only by: [facility] (if you have MyChart) OR A paper copy in the mail If you have any lab test that is abnormal or we need to change your treatment, we will call you to review the results.  Follow-Up: At [facility], you and your health needs are our priority.  As part of our continuing mission to provide you with exceptional heart care, we have created designated Provider Care Teams.  These Care Teams include your primary Cardiologist (physician) and Advanced Practice Providers (APPs -  Physician Assistants and Nurse Practitioners) who all work together to provide you with the care you need, when you need it.  We recommend signing up for the patient portal called "MyChart".  Sign up information is provided on this After Visit Summary.  MyChart is used to connect with patients for Virtual Visits (Telemedicine).  Patients are able to view lab/test results, encounter notes, upcoming appointments, etc.  Non-urgent messages can be sent to your provider as well.   To learn more about what you can do with MyChart, go to [email or web address].    Your next appointment:   01/29/23 at 1100 with Dr Percival Spanish

## 2022-11-29 ENCOUNTER — Encounter: Payer: Self-pay | Admitting: Cardiovascular Disease

## 2022-11-29 NOTE — Progress Notes (Signed)
Cardiology Office Note:    Date:  11/29/2022   ID:  Erin Ryan, DOB 08-Mar-1925, MRN [id number]  PCP:  Erin Stains, MD   [place] [facility] Cardiologist:  Erin Breeding, MD     Referring MD: Erin Stains, MD   Chief Complaint  Patient presents with   Atrial Fibrillation   Edema    History of Present Illness:    Erin Ryan is a 89 y.o. female with a hx of chronic diastolic heart failure, COPD, treated hypothyroidism, hypercholesterolemia, hypertension, CKD, who had an episode of atrial fibrillation with rapid ventricular response in the setting of heart failure exacerbation and small segmental pulmonary embolism given episode of urinary tract infection in November 2023.  The patient's daughter called several days ago because he did developed lower extremity edema and had a fairly sudden 5 pound increase in weight from usual weight of 125 pounds up to 130 pounds (our office and her home scale appear to match pretty well).  She was advised to increase the dose of furosemide from 20 mg every other day to 40 mg daily for 2 doses and then return to the previous prescription.  There was initial improvement in the edema, but she still weighs around 130 pounds.  This is not because of increased oral intake.  Erin Ryan daughter points out that her mother eats very very small quantities of food despite her coaxing.  She does drink 1 or 2 Boost bottles a day.  Erin Ryan is very hard of hearing.  She is quite sedentary but does not complain of chest pain or shortness of breath.  She has not had orthopnea or PND.  She has not had any palpitations and her daughter has not noticed any irregular rhythm when checking her blood pressure or oxygen saturation at home.  She has been on amiodarone ever since the hospitalization in November 2023.  She has also been taking Eliquis since then.  The plan was to take oral anticoagulation for the small pulmonary embolism for about 6 months, then  stop it due to her age and frailty related increased risk of bleeding.  She has not had any recent falls and she has not experienced syncope.  Diastolic dysfunction was evident on echocardiography (normal EF 55 to 123456, grade 2 diastolic dysfunction, mildly elevated PA pressure in November 2023) and by markedly elevated BNP (523 in November 2023, previous baseline 132 in 2022, 147 in 2018).  Past Medical History:  Diagnosis Date   Anxiety    Arthritis    COVID-19    04/2021   GERD (gastroesophageal reflux disease)    Hearing loss    has hearing aids   High cholesterol    Hypertension    Hypothyroid    Osteoporosis    Scoliosis    Thyroid disease     Past Surgical History:  Procedure Laterality Date   CATARACT EXTRACTION     cysts removed from bilateral breasts     THYROIDECTOMY      Current Medications: Current Meds  Medication Sig   acetaminophen (TYLENOL) 325 MG tablet Take 2 tablets (650 mg total) by mouth every 6 (six) hours as needed for mild pain (or Fever >/= 101).   amLODipine (NORVASC) 5 MG tablet Take 1 tablet (5 mg total) by mouth daily.   atorvastatin (LIPITOR) 40 MG tablet Take 40 mg by mouth daily.   Calcium Citrate-Vitamin D (CALCIUM CITRATE + PO) Take 1 tablet 2 (two) times daily by mouth.  Calcium Citrate-Vitamin D 250-5 MG-MCG TABS Take 1 tablet by mouth daily in the afternoon.   denosumab (PROLIA) 60 MG/ML SOSY injection Inject 60 mg into the skin every 6 (six) months.   donepezil (ARICEPT) 10 MG tablet Take 1 tablet (10 mg total) by mouth at bedtime.   DULoxetine (CYMBALTA) 30 MG capsule Take 30 mg by mouth daily.   gabapentin (NEURONTIN) 100 MG capsule Take 100 mg by mouth at bedtime.   lactose free nutrition (BOOST PLUS) LIQD Take 237 mLs by mouth 2 (two) times daily between meals.   levothyroxine (SYNTHROID) 100 MCG tablet Take 1 tablet (100 mcg total) by mouth in the morning. DAW1 Synthroid   mirtazapine (REMERON) 15 MG tablet Take 15 mg by mouth at  bedtime.   Multiple Vitamins-Minerals (CENTRUM SILVER PO) Take 1 tablet by mouth daily.   omeprazole (PRILOSEC) 40 MG capsule Take 1 capsule (40 mg total) by mouth 2 (two) times daily before a meal.   polyvinyl alcohol (LIQUIFILM TEARS) 1.4 % ophthalmic solution Place 1 drop into both eyes as needed for dry eyes.   [DISCONTINUED] amiodarone (PACERONE) 200 MG tablet Take 1 tablet (200 mg total) by mouth daily.   [DISCONTINUED] apixaban (ELIQUIS) 2.5 MG TABS tablet Take 1 tablet (2.5 mg total) by mouth 2 (two) times daily.   [DISCONTINUED] furosemide (LASIX) 20 MG tablet TAKE 1 TABLET BY MOUTH EVERY OTHER DAY   [DISCONTINUED] Metoprolol Tartrate 75 MG TABS Take 1 tablet (75 mg total) by mouth 2 (two) times daily.     Allergies:   Ambrosia trifida (tall ragweed) allergy skin test, Hydrochlorothiazide, Lyrica [pregabalin], Diltiazem hcl er, and Short ragweed pollen ext   Social History   Socioeconomic History   Marital status: Widowed    Spouse name: Not on file   Number of children: 1   Years of education: Not on file   Highest education level: Not on file  Occupational History   Occupation: Retired    Fish farm manager: RETIRED    Comment: Scientist, research (physical sciences)  Tobacco Use   Smoking status: Never   Smokeless tobacco: Never  Vaping Use   Vaping Use: Never used  Substance and Sexual Activity   Alcohol use: No   Drug use: No   Sexual activity: Not on file  Other Topics Concern   Not on file  Social History Narrative   Lives with daughter.    Has 1 daughter, Erin Ryan, and 2 grandkids   Social Determinants of Health   Financial Resource Strain: Not on file  Food Insecurity: Unknown (08/15/2022)   Hunger Vital Sign    Worried About Running Out of Food in the Last Year: Patient refused    Ran Out of Food in the Last Year: Patient refused  Transportation Needs: Unknown (08/15/2022)   PRAPARE - Hydrologist (Medical): Patient refused    Lack of Transportation  (Non-Medical): Patient refused  Physical Activity: Not on file  Stress: Not on file  Social Connections: Not on file     Family History: The patient's family history includes Asthma in her father; Bladder Cancer in her mother; Heart attack in her brother; Heart disease in her father; Heart disease (age of onset: 13) in her mother.  ROS:   Please see the history of present illness.     All other systems reviewed and are negative.  EKGs/Labs/Other Studies Reviewed:    The following studies were reviewed today: Echo 08/11/2022    1. Left ventricular ejection  fraction, by estimation, is 55 to 60%. The  left ventricle has normal function. The left ventricle has no regional  wall motion abnormalities. There is mild asymmetric left ventricular  hypertrophy of the basal-septal segment.  Left ventricular diastolic parameters are consistent with Grade II  diastolic dysfunction (pseudonormalization). Elevated left ventricular  end-diastolic pressure.   2. Right ventricular systolic function is low normal. The right  ventricular size is mildly enlarged. There is mildly elevated pulmonary  artery systolic pressure.   3. Right atrial size was mildly dilated.   4. The mitral valve is grossly normal. Mild mitral valve regurgitation.  Mild mitral stenosis. Moderate mitral annular calcification.   5. The aortic valve is tricuspid. There is mild thickening of the aortic  valve. Aortic valve regurgitation is moderate. Aortic valve sclerosis is  present, with no evidence of aortic valve stenosis.   EKG:  EKG is  ordered today.  The ekg ordered today demonstrates normal sinus rhythm, rather low voltage on the lateral precordial leads but otherwise normal tracing.  QTc 443 ms.  Recent Labs: 08/10/2022: ALT 18; B Natriuretic Peptide 523.6 08/11/2022: TSH <0.010 08/16/2022: Hemoglobin 11.4; Magnesium 2.2; Platelets 320 08/19/2022: BUN 39; Creatinine, Ser 1.30; Potassium 5.0; Sodium 140  Recent  Lipid Panel No results found for: "CHOL", "TRIG", "HDL", "CHOLHDL", "VLDL", "LDLCALC", "LDLDIRECT"   Risk Assessment/Calculations:    CHA2DS2-VASc Score = 5   This indicates a 7.2% annual risk of stroke. The patient's score is based upon: CHF History: 1 HTN History: 1 Diabetes History: 0 Stroke History: 0 Vascular Disease History: 0 Age Score: 2 Gender Score: 1               Physical Exam:    VS:  BP (!) 129/59   Pulse 64   Ht '5\' 2"'$  (1.575 m)   Wt 130 lb 12.8 oz (59.3 kg)   SpO2 96%   BMI 23.92 kg/m     Wt Readings from Last 3 Encounters:  11/28/22 130 lb 12.8 oz (59.3 kg)  10/15/22 128 lb 9.6 oz (58.3 kg)  09/03/22 124 lb (56.2 kg)     GEN: Smiling, appears comfortable well nourished, well developed in no acute distress HEENT: Normal NECK: No JVD; No carotid bruits LYMPHATICS: No lymphadenopathy CARDIAC: RRR, no murmurs, rubs, gallops RESPIRATORY:  Clear to auscultation without rales, wheezing or rhonchi  ABDOMEN: Soft, non-tender, non-distended MUSCULOSKELETAL: 2+ symmetrical ankle edema; No deformity  SKIN: Warm and dry NEUROLOGIC:  Alert and oriented x 3, very hard of hearing PSYCHIATRIC:  Normal affect   ASSESSMENT:    1. Atrial fibrillation with RVR ([place])   2. Diastolic CHF, chronic ([place])   3. Adult hypothyroidism   4. History of pulmonary embolism   5. Acquired thrombophilia ([place])   6. Essential hypertension   7. Stage 3b chronic kidney disease ([place])    PLAN:    In order of problems listed above:  AFib: No clinical recurrence of arrhythmia since November during her acute illness.  Note history of symptomatic bradycardia in the past.  Will stop the amiodarone, but continue the Eliquis at least until her follow-up visit with Dr. Percival Spanish in May.  At that time 6 months will have elapsed since her small pulmonary embolism.  The decision can be made at that time whether or not it is truly wise to discontinue the Eliquis due to the high bleeding risk,  or continuing it indefinitely due to the likelihood of recurrent atrial fibrillation. CHF: She has mild  ankle edema, but no other evidence of hypervolemia.  She is not short of breath.  Increase the furosemide to 40 mg daily over the weekend and then take 20 mg daily, rather than every other day. Hyperthyroidism: On levothyroxine supplementation.  She does not have any overt findings to suggest hyperthyroidism, but when she was hospitalized in November she had a completely suppressed TSH and an elevated free T4.  Evaluation of her thyroid test is further complicated by the fact that she is now on amiodarone.  Stopping amiodarone.  Should have thyroid function test repeated in several weeks. Pulmonary embolism: There was a small segmental pulm embolism seen on her CT November which seems to have been an incidental finding rather than a driver of her illness.  There was no evidence of DVT with ultrasonography.  She is currently on Eliquis with a plan to discontinue it in May. Anticoagulation: Held was dose adjusted for age, small body size.  She has not had any falls or bleeding complications in the last several months since send the anticoagulant was started. HTN: Good blood pressure control.  She tends to run a rather low diastolic blood pressure.  Currently on metoprolol and amlodipine. CKD 3b: Estimated baseline GFR 35-40            Medication Adjustments/Labs and Tests Ordered: Current medicines are reviewed at length with the patient today.  Concerns regarding medicines are outlined above.  Orders Placed This Encounter  Procedures   Basic metabolic panel   EKG [id number]   Meds ordered this encounter  Medications   apixaban (ELIQUIS) 2.5 MG TABS tablet    Sig: Take 1 tablet (2.5 mg total) by mouth 2 (two) times daily.    Dispense:  180 tablet    Refill:  3   furosemide (LASIX) 20 MG tablet    Sig: Take 1 tablet (20 mg total) by mouth daily.    Dispense:  90 tablet    Refill:  3    Metoprolol Tartrate 75 MG TABS    Sig: Take 1 tablet (75 mg total) by mouth 2 (two) times daily.    Dispense:  180 tablet    Refill:  6    Patient Instructions  Medication Instructions:  Stop taking Amiodarone Take Furosemide (Lasix) '40mg'$  on Saturday and Sunday- Monday- Start taking Lasix '20mg'$  a day *If you need a refill on your cardiac medications before your next appointment, please call your pharmacy*  Lab Work: BMET- 3 weeks If you have labs (blood work) drawn today and your tests are completely normal, you will receive your results only by: [place] (if you have MyChart) OR A paper copy in the mail If you have any lab test that is abnormal or we need to change your treatment, we will call you to review the results.  Follow-Up: At [facility], you and your health needs are our priority.  As part of our continuing mission to provide you with exceptional heart care, we have created designated Provider Care Teams.  These Care Teams include your primary Cardiologist (physician) and Advanced Practice Providers (APPs -  Physician Assistants and Nurse Practitioners) who all work together to provide you with the care you need, when you need it.  We recommend signing up for the patient portal called "MyChart".  Sign up information is provided on this After Visit Summary.  MyChart is used to connect with patients for Virtual Visits (Telemedicine).  Patients are able to view lab/test results, encounter notes,  upcoming appointments, etc.  Non-urgent messages can be sent to your provider as well.   To learn more about what you can do with MyChart, go to [email or web address].    Your next appointment:   01/29/23 at 1100 with Dr Percival Spanish   Signed, Sanda Klein, MD  11/29/2022 3:25 PM    [place]

## 2022-12-04 ENCOUNTER — Telehealth: Payer: Self-pay | Admitting: Cardiovascular Disease

## 2022-12-04 NOTE — Telephone Encounter (Signed)
Pt c/o medication issue:  1. Name of Medication:   furosemide (LASIX) 20 MG tablet    2. How are you currently taking this medication (dosage and times per day)? Take 1 tablet (20 mg total) by mouth daily.   3. Are you having a reaction (difficulty breathing--STAT)? no  4. What is your medication issue? Pt's daughter would like a callback regarding above medication because she feels its not working for the pt, she doesn't know if it needs to be stronger or what changes can be made. Please advise.

## 2022-12-04 NOTE — Telephone Encounter (Signed)
This is Dr. Rosezella [place] patient, please refer questions to him from now on. OK to do another 2 days of 40 mg of the diuretic, then back to 20 mg daily and check with Dr. Percival Spanish for any long term changes to the diuretic plan. Thanks

## 2022-12-04 NOTE — Telephone Encounter (Signed)
Spoke to pt's daughter. She gave pt 40 mg two days in a row then 20 mg daily per Dr. Loletha Grayer. Pt's ankles and feet are still swollen. Per pt she peeing more. Pt did not weigh this morning. Pt's daughter asked about elevating her feet, encouraged to do that to help with the swelling. Will get message to Dr. Loletha Grayer for advise.

## 2022-12-05 ENCOUNTER — Telehealth: Payer: Self-pay | Admitting: Adult Health

## 2022-12-05 DIAGNOSIS — I5033 Acute on chronic diastolic (congestive) heart failure: Secondary | ICD-10-CM | POA: Diagnosis not present

## 2022-12-05 DIAGNOSIS — I2699 Other pulmonary embolism without acute cor pulmonale: Secondary | ICD-10-CM | POA: Diagnosis not present

## 2022-12-05 DIAGNOSIS — I4891 Unspecified atrial fibrillation: Secondary | ICD-10-CM | POA: Diagnosis not present

## 2022-12-05 DIAGNOSIS — J441 Chronic obstructive pulmonary disease with (acute) exacerbation: Secondary | ICD-10-CM | POA: Diagnosis not present

## 2022-12-05 DIAGNOSIS — N184 Chronic kidney disease, stage 4 (severe): Secondary | ICD-10-CM | POA: Diagnosis not present

## 2022-12-05 DIAGNOSIS — I130 Hypertensive heart and chronic kidney disease with heart failure and stage 1 through stage 4 chronic kidney disease, or unspecified chronic kidney disease: Secondary | ICD-10-CM | POA: Diagnosis not present

## 2022-12-05 NOTE — Telephone Encounter (Signed)
Daughter Darl Householder called wanting to inform provider that in her mother's upcoming appt to not mention that the pt has Dementia. Pt is traumatized by her sisters death due to dementia. Daughter tells her that when the pt is being examined for dementia/memory they are just doing it due to history of family having dementia. Daughter would like the appt to be discrete so that the pt does not panic. Marland Kitchen

## 2022-12-08 MED ORDER — FUROSEMIDE 20 MG PO TABS: 20.0000 mg | ORAL_TABLET | Freq: Every day | ORAL | 3 refills | Status: DC

## 2022-12-08 NOTE — Telephone Encounter (Signed)
Pt's daughter just wanted [place] to be aware that the pt's weight is 130. I asked if she kept a log of her weight, to see if she has gained 3 lb in one day and/or 5 lb in one week. She said she does not have that info. She would start weighing her daily (she says it is hard due to pt's knee swelling and it is hard for her to stand on scale/without holding on to something) I told her to do the best she can. Said that the pt's weight has been between 128-130. Told her to notify us of weight changes and if she develops any shortness of breath.   She verbalized understanding. Prescription sent in for lasix 20mg  daily.

## 2022-12-08 NOTE — Telephone Encounter (Signed)
Pt's daughter is calling to follow up and stated that the pt only has 6 pills left

## 2022-12-10 DIAGNOSIS — M1711 Unilateral primary osteoarthritis, right knee: Secondary | ICD-10-CM | POA: Diagnosis not present

## 2022-12-11 ENCOUNTER — Encounter: Payer: Self-pay | Admitting: Adult Health

## 2022-12-11 ENCOUNTER — Ambulatory Visit (INDEPENDENT_AMBULATORY_CARE_PROVIDER_SITE_OTHER): Payer: Medicare Other | Admitting: Adult Health

## 2022-12-11 VITALS — BP 149/68 | HR 67 | Ht 62.0 in | Wt 134.8 lb

## 2022-12-11 DIAGNOSIS — F03B0 Unspecified dementia, moderate, without behavioral disturbance, psychotic disturbance, mood disturbance, and anxiety: Secondary | ICD-10-CM

## 2022-12-11 NOTE — Progress Notes (Signed)
PATIENT: Erin Ryan DOB: March 21, 1925  REASON FOR VISIT: follow up HISTORY FROM: patient PRIMARY NEUROLOGIST: Dr. Jaynee Eagles  Chief Complaint  Patient presents with   Follow-up    Rm 9, [name]-daughter.  Pt not aware she has dementia, and sister wants to leave it that way     HISTORY OF PRESENT ILLNESS: Today 12/11/22:  Erin Ryan is a 89 y.o. female with a history of Memory disturbance. Returns today for follow-up.  At the last visit we increased Aricept to 10 mg daily.  Daughter reports that she has been tolerating this well.  Daughter sent a message prior to this visit asking that we do not mention the work memory as it upsets the patient.  She continues to live with her daughter.  Requires assistance with ADLs.  Daughter manages her medications appointments and finances.  Patient has high anxiety about her sister who passed away with Alzheimer's.  She is worried that the same will happen to her.   05/20/22: [name] is a 89 year old female with a history of memory disturbance.  She returns today for follow-up.  She Lives with her daughter. She is able to complete all ADLS independently. Daughter manages her finances.  Daughter fills her pillbox for her.  She is currently on Aricept 5 mg and daughter reports that she has been tolerating this well.  The only change that she has noticed is that if the daughter is going out of town and someone out stays with the patient she gets upset.  Otherwise the patient has remained relatively stable.  Her sister with Alzheimer's passed away several months ago.  HISTORY   Erin Ryan is a 89 y.o. female here as requested by Harlan Stains, MD for memory loss and small vessel disease on MRI.  She has a past medical history of asthma, hyperlipidemia, hypertension with chronic kidney disease, osteoporosis, anxiety, COPD, frailty, acquired hypothyroidism, lumbago with sciatica on the right side, cognitive decline, compression fracture of L3, decreased  appetite, bilateral hearing loss, memory difficulty, glaucoma, scoliosis, questionable vascular dementia due to MRI on 09/10/2021.  I reviewed Dr. Orest Dikes notes: Daughter reports that patient has had more trouble with her memory, she has had a recent outbreak of shingles, daughter says that she has days that are good and some days that are better, she gets up several times to urinate, appetite is decreased, there is anxiety but stable, however she no longer enjoys watching TV or reading.  Daughter worries that her memory loss is worsened, she does have significant hearing difficulties, her level of attention seems to vary significantly from day-to-day, daughter feels like anxiety is well controlled but she does seem to be less engaged with life than previously, they switched from paroxetine to mirtazapine to see if that helps with her appetite.  Hearing aids have not been working as well.  More decline in the last several months.  No wandering at night.   Memory loss started about  ayear ago, her "phrasing was off", not being able to express herself as well, short-term memory loss, repeating the same questions 15 minutes later, saying the same things, she can't balance checkbook anymore, lives with daughter and she takes care of everything business wise, she has difficulty understanding letters like for medicare, very different prior baseline where she of course completely independent, she does not drive anymore, hasn't driven since covid and started worrying is she could, she can fix coffee but can't cook, she can't run the dishwasher, can dress  herself, she does not like to take a bath daughter has to encourage her to complete hygiene, no falls, less interest in doing things, no hallucinations, no delusions, no changes in personality but she is a little more irritable about certain things, poor awareness into her memory loss, her sister is in the late stages of dementia. She is more childish.    Reviewed  notes, labs and imaging from outside physicians, which showed:   I reviewed labs, recent urinalysis was negative for UTI.  Labs collected June 13, 2021 showed a vitamin D level of 76, TSH of 0.5, CMP with BUN 26 and creatinine 1.14 EGFR 45 otherwise normal, B12 875, magnesium normal   MRI of the brain 09/02/2021: IMPRESSION: personally reviewed and agreed and reviewed images also with patient and  No evidence of acute intracranial abnormality. Moderate chronic small vessel ischemic changes within the cerebral white matter.Moderate generalized cerebral atrophy. Comparatively mild cerebellar atrophy.Suspected small arachnoid cyst overlying the posterior right frontal lobe. Mild mucosal thickening within the bilateral ethmoid and left maxillary sinuses.Trace fluid within the bilateral mastoid air cells.  REVIEW OF SYSTEMS: Out of a complete 14 system review of symptoms, the patient complains only of the following symptoms, and all other reviewed systems are negative.  ALLERGIES: Allergies  Allergen Reactions   Ambrosia Trifida (Tall Ragweed) Allergy Skin Test Other (See Comments)    Other reaction(s): Other   Hydrochlorothiazide     hyponatremia   Lyrica [Pregabalin] Other (See Comments)    Severe confusion   Diltiazem Hcl Er Rash   Short Ragweed Pollen Ext     Other reaction(s): Other    HOME MEDICATIONS: Outpatient Medications Prior to Visit  Medication Sig Dispense Refill   acetaminophen (TYLENOL) 325 MG tablet Take 2 tablets (650 mg total) by mouth every 6 (six) hours as needed for mild pain (or Fever >/= 101).     amLODipine (NORVASC) 5 MG tablet Take 1 tablet (5 mg total) by mouth daily. 90 tablet 3   apixaban (ELIQUIS) 2.5 MG TABS tablet Take 1 tablet (2.5 mg total) by mouth 2 (two) times daily. 180 tablet 3   atorvastatin (LIPITOR) 40 MG tablet Take 40 mg by mouth daily.     Calcium Citrate-Vitamin D (CALCIUM CITRATE + PO) Take 1 tablet 2 (two) times daily by mouth.      denosumab (PROLIA) 60 MG/ML SOSY injection Inject 60 mg into the skin every 6 (six) months.     donepezil (ARICEPT) 10 MG tablet Take 1 tablet (10 mg total) by mouth at bedtime. 90 tablet 3   DULoxetine (CYMBALTA) 30 MG capsule Take 30 mg by mouth daily.     furosemide (LASIX) 20 MG tablet Take 1 tablet (20 mg total) by mouth daily. 90 tablet 3   gabapentin (NEURONTIN) 100 MG capsule Take 100 mg by mouth at bedtime.     lactose free nutrition (BOOST PLUS) LIQD Take 237 mLs by mouth 2 (two) times daily between meals.  0   levothyroxine (SYNTHROID) 100 MCG tablet Take 1 tablet (100 mcg total) by mouth in the morning. DAW1 Synthroid 30 tablet 1   Metoprolol Tartrate 75 MG TABS Take 1 tablet (75 mg total) by mouth 2 (two) times daily. 180 tablet 6   mirtazapine (REMERON) 15 MG tablet Take 15 mg by mouth at bedtime.     Multiple Vitamins-Minerals (CENTRUM SILVER PO) Take 1 tablet by mouth daily.     omeprazole (PRILOSEC) 40 MG capsule Take 1 capsule (40  mg total) by mouth 2 (two) times daily before a meal. 180 capsule 3   polyvinyl alcohol (LIQUIFILM TEARS) 1.4 % ophthalmic solution Place 1 drop into both eyes as needed for dry eyes. 15 mL 0   No facility-administered medications prior to visit.    PAST MEDICAL HISTORY: Past Medical History:  Diagnosis Date   Anxiety    Arthritis    COVID-19    04/2021   GERD (gastroesophageal reflux disease)    Hearing loss    has hearing aids   High cholesterol    Hypertension    Hypothyroid    Osteoporosis    Scoliosis    Thyroid disease     PAST SURGICAL HISTORY: Past Surgical History:  Procedure Laterality Date   CATARACT EXTRACTION     cysts removed from bilateral breasts     THYROIDECTOMY      FAMILY HISTORY: Family History  Problem Relation Age of Onset   Heart disease Mother 16       Heart attack   Bladder Cancer Mother    Heart disease Father    Asthma Father    Heart attack Brother        Age undetermined    SOCIAL  HISTORY: Social History   Socioeconomic History   Marital status: Widowed    Spouse name: Not on file   Number of children: 1   Years of education: Not on file   Highest education level: Not on file  Occupational History   Occupation: Retired    Fish farm manager: RETIRED    Comment: Scientist, research (physical sciences)  Tobacco Use   Smoking status: Never   Smokeless tobacco: Never  Vaping Use   Vaping Use: Never used  Substance and Sexual Activity   Alcohol use: No   Drug use: No   Sexual activity: Not on file  Other Topics Concern   Not on file  Social History Narrative   Lives with daughter.    Has 1 daughter, [name], and 2 grandkids   Social Determinants of Health   Financial Resource Strain: Not on file  Food Insecurity: Patient Declined (08/15/2022)   Hunger Vital Sign    Worried About Running Out of Food in the Last Year: Patient declined    [place] in the Last Year: Patient declined  Transportation Needs: Patient Declined (08/15/2022)   PRAPARE - Hydrologist (Medical): Patient declined    Lack of Transportation (Non-Medical): Patient declined  Physical Activity: Not on file  Stress: Not on file  Social Connections: Not on file  Intimate Partner Violence: Patient Declined (08/15/2022)   Humiliation, Afraid, Rape, and Kick questionnaire    Fear of Current or Ex-Partner: Patient declined    Emotionally Abused: Patient declined    Physically Abused: Patient declined    Sexually Abused: Patient declined      PHYSICAL EXAM  Vitals:   12/11/22 1123  BP: (!) 149/68  Pulse: 67  Weight: 134 lb 12.8 oz (61.1 kg)  Height: 5\' 2"  (1.575 m)    Body mass index is 24.66 kg/m.     12/11/2022   11:47 AM 05/20/2022    9:33 AM 11/05/2021   10:00 AM  MMSE - Mini Mental State Exam  Orientation to time 1 4 4   Orientation to time comments   season: fall  Orientation to Place 5 5 2   Registration 3 3 3   Attention/ Calculation 2 0 0  Recall 1 1 1  Language- name 2 objects 2 2 2   Language- repeat 1 1 0  Language- follow 3 step command 3 2 2   Language- read & follow direction 1 1 1   Write a sentence 1 1 1   Write a sentence-comments   "I want to see [place]."  Copy design 0 1 0  Total score 20 21 16      Generalized: Well developed, in no acute distress   Neurological examination  Mentation: Alert oriented to time, place, history taking. Follows all commands speech and language fluent Cranial nerve II-XII: Pupils were equal round reactive to light. Extraocular movements were full, visual field were full on confrontational test. Facial sensation and strength were normal. Head turning and shoulder shrug  were normal and symmetric. Motor: The motor testing reveals 5 over 5 strength of all 4 extremities. Good symmetric motor tone is noted throughout.  Sensory: Sensory testing is intact to soft touch on all 4 extremities. No evidence of extinction is noted.  Coordination: Cerebellar testing reveals good finger-nose-finger and heel-to-shin bilaterally.  Some difficulty with understanding directions to complete this task Gait and station: Gait is normal. .    DIAGNOSTIC DATA (LABS, IMAGING, TESTING) - I reviewed patient records, labs, notes, testing and imaging myself where available.  Lab Results  Component Value Date   WBC 8.6 08/16/2022   HGB 11.4 (L) 08/16/2022   HCT 36.7 08/16/2022   MCV 90.6 08/16/2022   PLT 320 08/16/2022      Component Value Date/Time   NA 140 08/19/2022 0449   NA 138 07/22/2021 1413   K 5.0 08/19/2022 0449   CL 106 08/19/2022 0449   CO2 24 08/19/2022 0449   GLUCOSE 101 (H) 08/19/2022 0449   BUN 39 (H) 08/19/2022 0449   BUN 26 07/22/2021 1413   CREATININE 1.30 (H) 08/19/2022 0449   CALCIUM 9.1 08/19/2022 0449   PROT 6.5 08/10/2022 1103   ALBUMIN 2.8 (L) 08/10/2022 1103   AST 28 08/10/2022 1103   ALT 18 08/10/2022 1103   ALKPHOS 64 08/10/2022 1103   BILITOT 0.4 08/10/2022 1103   GFRNONAA 38 (L)  08/19/2022 0449   GFRAA 61 11/15/2007 1528   Lab Results  Component Value Date   TSH <0.010 (L) 08/11/2022      ASSESSMENT AND PLAN 89 y.o. year old female  has a past medical history of Anxiety, Arthritis, COVID-19, GERD (gastroesophageal reflux disease), Hearing loss, High cholesterol, Hypertension, Hypothyroid, Osteoporosis, Scoliosis, and Thyroid disease. here with:   Memory disturbance  -Continue Aricept 10 mg daily  - Memory score is stable 20/30 previously 21/30 -Advised if symptoms worsen or she develops new symptoms she should let us know -Follow-up in 6 months or sooner if needed      Ward Givens, MSN, NP-C 12/11/2022, 11:15 AM [facility] [address], [place],  [address] [phone]

## 2022-12-12 DIAGNOSIS — I4891 Unspecified atrial fibrillation: Secondary | ICD-10-CM | POA: Diagnosis not present

## 2022-12-12 DIAGNOSIS — J441 Chronic obstructive pulmonary disease with (acute) exacerbation: Secondary | ICD-10-CM | POA: Diagnosis not present

## 2022-12-12 DIAGNOSIS — I2699 Other pulmonary embolism without acute cor pulmonale: Secondary | ICD-10-CM | POA: Diagnosis not present

## 2022-12-12 DIAGNOSIS — N184 Chronic kidney disease, stage 4 (severe): Secondary | ICD-10-CM | POA: Diagnosis not present

## 2022-12-12 DIAGNOSIS — I5033 Acute on chronic diastolic (congestive) heart failure: Secondary | ICD-10-CM | POA: Diagnosis not present

## 2022-12-12 DIAGNOSIS — I130 Hypertensive heart and chronic kidney disease with heart failure and stage 1 through stage 4 chronic kidney disease, or unspecified chronic kidney disease: Secondary | ICD-10-CM | POA: Diagnosis not present

## 2022-12-18 ENCOUNTER — Other Ambulatory Visit: Payer: Self-pay | Admitting: Cardiovascular Disease

## 2022-12-18 DIAGNOSIS — R609 Edema, unspecified: Secondary | ICD-10-CM

## 2022-12-18 DIAGNOSIS — I5032 Chronic diastolic (congestive) heart failure: Secondary | ICD-10-CM | POA: Diagnosis not present

## 2022-12-18 DIAGNOSIS — I4891 Unspecified atrial fibrillation: Secondary | ICD-10-CM | POA: Diagnosis not present

## 2022-12-18 NOTE — Telephone Encounter (Signed)
Left voicemail for patient to return call to office. 

## 2022-12-18 NOTE — Telephone Encounter (Signed)
Pt's daughter Darl Householder) stated they've kept record of pt's weight for 5 days. Wt fluctuated between 129-132lbs. Pt's daughter states pt's ankles & feet are extremely swollen.  12-18-22, J.Britt

## 2022-12-19 ENCOUNTER — Inpatient Hospital Stay (HOSPITAL_COMMUNITY)
Admission: EM | Admit: 2022-12-19 | Discharge: 2022-12-23 | DRG: 177 | Disposition: A | Discharge status: Skilled Nursing Facility | Payer: Medicare Other | Attending: Student | Admitting: Student

## 2022-12-19 ENCOUNTER — Telehealth: Payer: Self-pay | Admitting: Cardiology

## 2022-12-19 ENCOUNTER — Emergency Department (HOSPITAL_COMMUNITY): Payer: Medicare Other

## 2022-12-19 DIAGNOSIS — U071 COVID-19: Secondary | ICD-10-CM | POA: Diagnosis present

## 2022-12-19 DIAGNOSIS — I10 Essential (primary) hypertension: Secondary | ICD-10-CM | POA: Diagnosis present

## 2022-12-19 DIAGNOSIS — I130 Hypertensive heart and chronic kidney disease with heart failure and stage 1 through stage 4 chronic kidney disease, or unspecified chronic kidney disease: Secondary | ICD-10-CM | POA: Diagnosis present

## 2022-12-19 DIAGNOSIS — Z79890 Hormone replacement therapy (postmenopausal): Secondary | ICD-10-CM

## 2022-12-19 DIAGNOSIS — H9190 Unspecified hearing loss, unspecified ear: Secondary | ICD-10-CM | POA: Diagnosis present

## 2022-12-19 DIAGNOSIS — J9691 Respiratory failure, unspecified with hypoxia: Secondary | ICD-10-CM | POA: Diagnosis present

## 2022-12-19 DIAGNOSIS — J9601 Acute respiratory failure with hypoxia: Secondary | ICD-10-CM

## 2022-12-19 DIAGNOSIS — E7800 Pure hypercholesterolemia, unspecified: Secondary | ICD-10-CM | POA: Diagnosis not present

## 2022-12-19 DIAGNOSIS — Z974 Presence of external hearing-aid: Secondary | ICD-10-CM

## 2022-12-19 DIAGNOSIS — Z7189 Other specified counseling: Secondary | ICD-10-CM | POA: Diagnosis not present

## 2022-12-19 DIAGNOSIS — Z86711 Personal history of pulmonary embolism: Secondary | ICD-10-CM

## 2022-12-19 DIAGNOSIS — F03B0 Unspecified dementia, moderate, without behavioral disturbance, psychotic disturbance, mood disturbance, and anxiety: Secondary | ICD-10-CM | POA: Diagnosis present

## 2022-12-19 DIAGNOSIS — E890 Postprocedural hypothyroidism: Secondary | ICD-10-CM | POA: Diagnosis present

## 2022-12-19 DIAGNOSIS — F0390 Unspecified dementia without behavioral disturbance: Secondary | ICD-10-CM | POA: Diagnosis not present

## 2022-12-19 DIAGNOSIS — R350 Frequency of micturition: Secondary | ICD-10-CM | POA: Diagnosis present

## 2022-12-19 DIAGNOSIS — E039 Hypothyroidism, unspecified: Secondary | ICD-10-CM | POA: Diagnosis not present

## 2022-12-19 DIAGNOSIS — Z79899 Other long term (current) drug therapy: Secondary | ICD-10-CM | POA: Diagnosis not present

## 2022-12-19 DIAGNOSIS — Z8616 Personal history of COVID-19: Secondary | ICD-10-CM

## 2022-12-19 DIAGNOSIS — I509 Heart failure, unspecified: Secondary | ICD-10-CM | POA: Diagnosis not present

## 2022-12-19 DIAGNOSIS — M810 Age-related osteoporosis without current pathological fracture: Secondary | ICD-10-CM | POA: Diagnosis present

## 2022-12-19 DIAGNOSIS — R062 Wheezing: Secondary | ICD-10-CM | POA: Diagnosis not present

## 2022-12-19 DIAGNOSIS — M1990 Unspecified osteoarthritis, unspecified site: Secondary | ICD-10-CM | POA: Diagnosis not present

## 2022-12-19 DIAGNOSIS — N1832 Chronic kidney disease, stage 3b: Secondary | ICD-10-CM | POA: Diagnosis not present

## 2022-12-19 DIAGNOSIS — Z515 Encounter for palliative care: Secondary | ICD-10-CM | POA: Diagnosis not present

## 2022-12-19 DIAGNOSIS — R609 Edema, unspecified: Secondary | ICD-10-CM | POA: Diagnosis not present

## 2022-12-19 DIAGNOSIS — I480 Paroxysmal atrial fibrillation: Secondary | ICD-10-CM | POA: Diagnosis present

## 2022-12-19 DIAGNOSIS — R531 Weakness: Secondary | ICD-10-CM | POA: Diagnosis not present

## 2022-12-19 DIAGNOSIS — J1282 Pneumonia due to coronavirus disease 2019: Secondary | ICD-10-CM | POA: Diagnosis present

## 2022-12-19 DIAGNOSIS — F419 Anxiety disorder, unspecified: Secondary | ICD-10-CM | POA: Diagnosis present

## 2022-12-19 DIAGNOSIS — I110 Hypertensive heart disease with heart failure: Secondary | ICD-10-CM | POA: Diagnosis not present

## 2022-12-19 DIAGNOSIS — K219 Gastro-esophageal reflux disease without esophagitis: Secondary | ICD-10-CM | POA: Diagnosis present

## 2022-12-19 DIAGNOSIS — Z7401 Bed confinement status: Secondary | ICD-10-CM | POA: Diagnosis not present

## 2022-12-19 DIAGNOSIS — Z888 Allergy status to other drugs, medicaments and biological substances status: Secondary | ICD-10-CM

## 2022-12-19 DIAGNOSIS — Z66 Do not resuscitate: Secondary | ICD-10-CM | POA: Diagnosis not present

## 2022-12-19 DIAGNOSIS — Z7901 Long term (current) use of anticoagulants: Secondary | ICD-10-CM | POA: Diagnosis not present

## 2022-12-19 DIAGNOSIS — R0689 Other abnormalities of breathing: Secondary | ICD-10-CM | POA: Diagnosis not present

## 2022-12-19 DIAGNOSIS — Z8249 Family history of ischemic heart disease and other diseases of the circulatory system: Secondary | ICD-10-CM

## 2022-12-19 DIAGNOSIS — I5033 Acute on chronic diastolic (congestive) heart failure: Secondary | ICD-10-CM | POA: Diagnosis present

## 2022-12-19 DIAGNOSIS — R0603 Acute respiratory distress: Secondary | ICD-10-CM | POA: Diagnosis not present

## 2022-12-19 DIAGNOSIS — R0602 Shortness of breath: Secondary | ICD-10-CM | POA: Diagnosis not present

## 2022-12-19 DIAGNOSIS — I5031 Acute diastolic (congestive) heart failure: Secondary | ICD-10-CM | POA: Diagnosis not present

## 2022-12-19 DIAGNOSIS — R410 Disorientation, unspecified: Secondary | ICD-10-CM | POA: Diagnosis not present

## 2022-12-19 DIAGNOSIS — J189 Pneumonia, unspecified organism: Principal | ICD-10-CM

## 2022-12-19 LAB — CBC WITH DIFFERENTIAL/PLATELET
Abs Immature Granulocytes: 0.17 10*3/uL — ABNORMAL HIGH (ref 0.00–0.07)
Basophils Absolute: 0.1 10*3/uL (ref 0.0–0.1)
Basophils Relative: 1 %
Eosinophils Absolute: 0.4 10*3/uL (ref 0.0–0.5)
Eosinophils Relative: 4 %
HCT: 41.1 % (ref 36.0–46.0)
Hemoglobin: 13.3 g/dL (ref 12.0–15.0)
Immature Granulocytes: 2 %
Lymphocytes Relative: 10 %
Lymphs Abs: 0.9 10*3/uL (ref 0.7–4.0)
MCH: 31.1 pg (ref 26.0–34.0)
MCHC: 32.4 g/dL (ref 30.0–36.0)
MCV: 96 fL (ref 80.0–100.0)
Monocytes Absolute: 0.8 10*3/uL (ref 0.1–1.0)
Monocytes Relative: 10 %
Neutro Abs: 6.1 10*3/uL (ref 1.7–7.7)
Neutrophils Relative %: 73 %
Platelets: 258 10*3/uL (ref 150–400)
RBC: 4.28 MIL/uL (ref 3.87–5.11)
RDW: 15.1 % (ref 11.5–15.5)
WBC: 8.4 10*3/uL (ref 4.0–10.5)
nRBC: 0 % (ref 0.0–0.2)

## 2022-12-19 LAB — I-STAT VENOUS BLOOD GAS, ED
Acid-Base Excess: 0 mmol/L (ref 0.0–2.0)
Bicarbonate: 23.4 mmol/L (ref 20.0–28.0)
Calcium, Ion: 1 mmol/L — ABNORMAL LOW (ref 1.15–1.40)
HCT: 40 % (ref 36.0–46.0)
Hemoglobin: 13.6 g/dL (ref 12.0–15.0)
O2 Saturation: 82 %
Potassium: 4.1 mmol/L (ref 3.5–5.1)
Sodium: 136 mmol/L (ref 135–145)
TCO2: 24 mmol/L (ref 22–32)
pCO2, Ven: 34.4 mmHg — ABNORMAL LOW (ref 44–60)
pH, Ven: 7.441 — ABNORMAL HIGH (ref 7.25–7.43)
pO2, Ven: 44 mmHg (ref 32–45)

## 2022-12-19 LAB — BASIC METABOLIC PANEL
BUN/Creatinine Ratio: 32 — ABNORMAL HIGH (ref 12–28)
BUN: 48 mg/dL — ABNORMAL HIGH (ref 10–36)
CO2: 20 mmol/L (ref 20–29)
Calcium: 8.9 mg/dL (ref 8.7–10.3)
Chloride: 102 mmol/L (ref 96–106)
Creatinine, Ser: 1.51 mg/dL — ABNORMAL HIGH (ref 0.57–1.00)
Glucose: 109 mg/dL — ABNORMAL HIGH (ref 70–99)
Potassium: 4.8 mmol/L (ref 3.5–5.2)
Sodium: 141 mmol/L (ref 134–144)
eGFR: 32 mL/min/{1.73_m2} — ABNORMAL LOW (ref >59)

## 2022-12-19 LAB — COMPREHENSIVE METABOLIC PANEL
ALT: 24 U/L (ref 0–44)
AST: 37 U/L (ref 15–41)
Albumin: 3.1 g/dL — ABNORMAL LOW (ref 3.5–5.0)
Alkaline Phosphatase: 70 U/L (ref 38–126)
Anion gap: 15 (ref 5–15)
BUN: 46 mg/dL — ABNORMAL HIGH (ref 8–23)
CO2: 22 mmol/L (ref 22–32)
Calcium: 8.8 mg/dL — ABNORMAL LOW (ref 8.9–10.3)
Chloride: 100 mmol/L (ref 98–111)
Creatinine, Ser: 1.57 mg/dL — ABNORMAL HIGH (ref 0.44–1.00)
GFR, Estimated: 30 mL/min — ABNORMAL LOW (ref >60)
Glucose, Bld: 129 mg/dL — ABNORMAL HIGH (ref 70–99)
Potassium: 4.2 mmol/L (ref 3.5–5.1)
Sodium: 137 mmol/L (ref 135–145)
Total Bilirubin: 0.4 mg/dL (ref 0.3–1.2)
Total Protein: 7.1 g/dL (ref 6.5–8.1)

## 2022-12-19 LAB — BRAIN NATRIURETIC PEPTIDE: B Natriuretic Peptide: 321.4 pg/mL — ABNORMAL HIGH (ref 0.0–100.0)

## 2022-12-19 LAB — SARS CORONAVIRUS 2 BY RT PCR: SARS Coronavirus 2 by RT PCR: POSITIVE — AB

## 2022-12-19 LAB — TROPONIN I (HIGH SENSITIVITY)
Troponin I (High Sensitivity): 16 ng/L (ref <18)
Troponin I (High Sensitivity): 18 ng/L — ABNORMAL HIGH (ref <18)

## 2022-12-19 MED ORDER — SODIUM CHLORIDE 0.9 % IV SOLN
500.0000 mg | Freq: Once | INTRAVENOUS | Status: AC
  Administered 2022-12-19: 500 mg via INTRAVENOUS
  Filled 2022-12-19: qty 5

## 2022-12-19 MED ORDER — FUROSEMIDE 20 MG PO TABS: ORAL_TABLET | ORAL | 3 refills | Status: DC

## 2022-12-19 MED ORDER — SODIUM CHLORIDE 0.9 % IV SOLN
100.0000 mg | Freq: Every day | INTRAVENOUS | Status: AC
  Administered 2022-12-20 – 2022-12-21 (×2): 100 mg via INTRAVENOUS
  Filled 2022-12-19 (×5): qty 20

## 2022-12-19 MED ORDER — AMLODIPINE BESYLATE 2.5 MG PO TABS: 2.5000 mg | ORAL_TABLET | Freq: Every day | ORAL | 3 refills | Status: DC

## 2022-12-19 MED ORDER — DEXAMETHASONE 4 MG PO TABS
6.0000 mg | ORAL_TABLET | Freq: Once | ORAL | Status: AC
  Administered 2022-12-19: 6 mg via ORAL
  Filled 2022-12-19: qty 2

## 2022-12-19 MED ORDER — ACETAMINOPHEN 325 MG PO TABS
650.0000 mg | ORAL_TABLET | Freq: Four times a day (QID) | ORAL | Status: DC | PRN
  Administered 2022-12-22: 650 mg via ORAL
  Filled 2022-12-19: qty 2

## 2022-12-19 MED ORDER — ONDANSETRON HCL 4 MG/2ML IJ SOLN: 4.0000 mg | Freq: Four times a day (QID) | INTRAMUSCULAR | Status: DC | PRN

## 2022-12-19 MED ORDER — SODIUM CHLORIDE 0.9 % IV SOLN
200.0000 mg | Freq: Once | INTRAVENOUS | Status: AC
  Administered 2022-12-20: 200 mg via INTRAVENOUS
  Filled 2022-12-19: qty 40

## 2022-12-19 MED ORDER — SODIUM CHLORIDE 0.9 % IV SOLN
1.0000 g | Freq: Once | INTRAVENOUS | Status: AC
  Administered 2022-12-19: 1 g via INTRAVENOUS
  Filled 2022-12-19: qty 10

## 2022-12-19 MED ORDER — DEXAMETHASONE 6 MG PO TABS
6.0000 mg | ORAL_TABLET | Freq: Every day | ORAL | Status: DC
  Administered 2022-12-20 – 2022-12-21 (×3): 6 mg via ORAL
  Filled 2022-12-19: qty 1
  Filled 2022-12-19: qty 2
  Filled 2022-12-19 (×2): qty 1

## 2022-12-19 MED ORDER — FUROSEMIDE 10 MG/ML IJ SOLN
20.0000 mg | Freq: Two times a day (BID) | INTRAMUSCULAR | Status: DC
  Administered 2022-12-20: 20 mg via INTRAVENOUS
  Filled 2022-12-19: qty 2

## 2022-12-19 MED ORDER — FUROSEMIDE 20 MG PO TABS: 20.0000 mg | ORAL_TABLET | Freq: Every day | ORAL | 3 refills | Status: DC

## 2022-12-19 MED ORDER — ALBUTEROL SULFATE (2.5 MG/3ML) 0.083% IN NEBU: 2.5000 mg | INHALATION_SOLUTION | RESPIRATORY_TRACT | Status: DC | PRN

## 2022-12-19 MED ORDER — ACETAMINOPHEN 650 MG RE SUPP: 650.0000 mg | Freq: Four times a day (QID) | RECTAL | Status: DC | PRN

## 2022-12-19 MED ORDER — ONDANSETRON HCL 4 MG PO TABS: 4.0000 mg | ORAL_TABLET | Freq: Four times a day (QID) | ORAL | Status: DC | PRN

## 2022-12-19 MED ORDER — FUROSEMIDE 10 MG/ML IJ SOLN
20.0000 mg | Freq: Once | INTRAMUSCULAR | Status: AC
  Administered 2022-12-19: 20 mg via INTRAVENOUS
  Filled 2022-12-19: qty 2

## 2022-12-19 NOTE — Telephone Encounter (Signed)
Croitoru, Mihai, MD  Erin Holster, RN Labs show slightly worse kidney function compared to previous baseline, suggesting she is a little "dry". Furosemide 20 mg every other day seems to be insufficient diuretic and 20 mg daily may be a little bit too much.  I say lets put a difference and have her take furosemide 20 mg 5 days a week, with the option to take the furosemide on the "skip" days if she has edema or her weight is above 128 pounds.  Called to go over the above information. This information is also available on MyChart.  No answer on either phone- left message and call back number

## 2022-12-19 NOTE — Telephone Encounter (Signed)
Per operator, patient's daughter returned call. She decided to call EMS and they are taking patient to hospital.

## 2022-12-19 NOTE — Telephone Encounter (Signed)
  Pt c/o swelling: STAT is pt has developed SOB within 24 hours  If swelling, where is the swelling located? feet  How much weight have you gained and in what time span? unsure  Have you gained 3 pounds in a day or 5 pounds in a week?   Do you have a log of your daily weights (if so, list)?   Are you currently taking a fluid pill? Yes   Are you currently SOB? no  Have you traveled recently? No  Patient daughter called stating she spoke with nurse Valetta Fuller earlier today about her mother's feet.  She said the sitter just finished giving her mother a bath, she said her mother's foot has more a bluish tint to it, almost purplish color.  She said it more than just her toe than what she told the nurse earlier.   She also said her mother's feet are swollen.

## 2022-12-19 NOTE — Telephone Encounter (Signed)
Left message to call back  

## 2022-12-19 NOTE — ED Notes (Signed)
Patient put on purewick.  

## 2022-12-19 NOTE — ED Provider Notes (Signed)
[place] Provider Note  CSN: [id number] Arrival date & time: 12/19/22 1727  Chief Complaint(s) Shortness of Breath  HPI Erin Ryan is a 89 y.o. female with past medical history as below, significant for A-fib, dementia, hard of hearing, HLD, HTN, hypothyroid who presents to the ED with complaint of exertional dyspnea.  Level 5 caveat dementia, daughter at bedside and contributes to history who is primary caretaker.  Worse exertional dyspnea over the past 24 to 48 hours, increased fatigue over the past 2 to 3 days.  Reports patient has not "been herself" since coming home from rehab.  No home oxygen use typically.  Her Lasix was recently adjusted by cardiology, she reports worsening exertional dyspnea following this, worsen pedal and lower extremity swelling bilateral.  Unable to lie down flat when she sleeps.  Patient became severely short of breath with short ambulation earlier today, conversational dyspnea.  Patient was placed on nasal cannula by EMS, she is requiring 1-2 L cannula at rest to maintain O2 sat greater than 94%.  Fevers, chills, nausea or vomiting, no chest pain, denies change in bowel or bladder function, dementia.  Past Medical History Past Medical History:  Diagnosis Date   A-fib [facility])    Anxiety    Arthritis    COVID-19    04/2021   GERD (gastroesophageal reflux disease)    Hearing loss    has hearing aids   High cholesterol    Hypertension    Hypothyroid    Osteoporosis    Scoliosis    Thyroid disease    Patient Active Problem List   Diagnosis Date Noted   Enterococcus UTI 08/13/2022   Atrial fibrillation with RVR ([place]) 08/13/2022   Generalized weakness 08/13/2022   Urinary tract infection without hematuria 08/13/2022   Protein-calorie malnutrition, severe 08/12/2022   Acute pulmonary embolism ([place]) 08/10/2022   Moderate dementia without behavioral disturbance, psychotic disturbance, mood disturbance, or  anxiety ([place]) 11/05/2021   Tachycardia 10/13/2021   Upper airway cough syndrome 07/10/2017   Dyspnea on exertion 05/14/2015   Anxiety state 06/26/2014   Chronic kidney disease (CKD), stage III (moderate) (HCC) 06/26/2014   HLD (hyperlipidemia) 06/26/2014   Acid reflux 06/26/2014   Benign hypertensive kidney disease 06/26/2014   OP (osteoporosis) 06/26/2014   Peripheral neuralgia 06/26/2014   Adult hypothyroidism 06/26/2014   Arthralgia of hip or thigh 02/09/2014   GERD 12/06/2007   HYPERLIPIDEMIA 11/15/2007   Essential hypertension 11/15/2007   Home Medication(s) Prior to Admission medications   Medication Sig Start Date End Date Taking? Authorizing Provider  Acetaminophen (TYLENOL EXTRA STRENGTH PO) Take 1,000 mg by mouth 3 (three) times daily.    [provider]  amLODipine (NORVASC) 2.5 MG tablet Take 1 tablet (2.5 mg total) by mouth daily. 12/19/22   Croitoru, Mihai, MD  apixaban (ELIQUIS) 2.5 MG TABS tablet Take 1 tablet (2.5 mg total) by mouth 2 (two) times daily. 11/28/22   Croitoru, Mihai, MD  atorvastatin (LIPITOR) 40 MG tablet Take 40 mg by mouth daily.    [provider]  Calcium Citrate-Vitamin D (CALCIUM CITRATE + PO) Take 1 tablet 2 (two) times daily by mouth.    [provider]  denosumab (PROLIA) 60 MG/ML SOSY injection Inject 60 mg into the skin every 6 (six) months. 06/24/17   [provider]  donepezil (ARICEPT) 10 MG tablet Take 1 tablet (10 mg total) by mouth at bedtime. 05/20/22   Ward Givens, NP  DULoxetine (CYMBALTA)  30 MG capsule Take 30 mg by mouth daily. 09/06/21   [provider]  furosemide (LASIX) 20 MG tablet Take 1 tablet (20 mg total) by mouth daily. 12/19/22   Croitoru, Mihai, MD  gabapentin (NEURONTIN) 100 MG capsule Take 100 mg by mouth at bedtime. 05/07/22   [provider]  lactose free nutrition (BOOST PLUS) LIQD Take 237 mLs by mouth 2 (two) times daily between meals. 08/19/22   Eugenie Filler, MD  levothyroxine (SYNTHROID) 100 MCG tablet Take 1 tablet (100 mcg total) by mouth in the morning. DAW1 Synthroid 08/19/22   Eugenie Filler, MD  Metoprolol Tartrate 75 MG TABS Take 1 tablet (75 mg total) by mouth 2 (two) times daily. 11/28/22   Croitoru, Mihai, MD  mirtazapine (REMERON) 15 MG tablet Take 15 mg by mouth at bedtime. 10/08/21   [provider]  Multiple Vitamins-Minerals (CENTRUM SILVER PO) Take 1 tablet by mouth daily.    [provider]  omeprazole (PRILOSEC) 40 MG capsule Take 1 capsule (40 mg total) by mouth 2 (two) times daily before a meal. 09/11/17   Tanda Rockers, MD  polyvinyl alcohol (LIQUIFILM TEARS) 1.4 % ophthalmic solution Place 1 drop into both eyes as needed for dry eyes. 08/19/22   Eugenie Filler, MD                                                                                                                                    Past Surgical History Past Surgical History:  Procedure Laterality Date   CATARACT EXTRACTION     cysts removed from bilateral breasts     THYROIDECTOMY     Family History Family History  Problem Relation Age of Onset   Heart disease Mother 31       Heart attack   Bladder Cancer Mother    Heart disease Father    Asthma Father    Heart attack Brother        Age undetermined    Social History Social History   Tobacco Use   Smoking status: Never   Smokeless tobacco: Never  Vaping Use   Vaping Use: Never used  Substance Use Topics   Alcohol use: No   Drug use: No   Allergies Ambrosia trifida (tall ragweed) allergy skin test, Hydrochlorothiazide, Lyrica [pregabalin], Diltiazem hcl er, and Short ragweed pollen ext  Review of Systems Review of Systems  Unable to perform ROS: Dementia  Respiratory:  Positive for shortness of breath.   Cardiovascular:  Positive for leg swelling.    Physical Exam Vital Signs  I have reviewed the triage vital signs BP (!) 143/78   Pulse 75   Temp 99.8 F  (37.7 C) (Oral)   Resp 16   Ht 5\' 2"  (1.575 m)   Wt 61.1 kg   SpO2 96%   BMI 24.64 kg/m  Physical Exam Vitals and nursing note reviewed.  Constitutional:      General: She is not in acute distress.    Appearance: Normal appearance. She is not ill-appearing.  HENT:     Head: Normocephalic and atraumatic.     Right Ear: External ear normal.     Left Ear: External ear normal.     Nose: Nose normal.     Mouth/Throat:     Mouth: Mucous membranes are moist.  Eyes:     General: No scleral icterus.       Right eye: No discharge.        Left eye: No discharge.  Cardiovascular:     Rate and Rhythm: Normal rate and regular rhythm.     Pulses: Normal pulses.     Heart sounds: Normal heart sounds.  Pulmonary:     Effort: Pulmonary effort is normal. No tachypnea or respiratory distress.     Breath sounds: Decreased breath sounds present.  Abdominal:     General: Abdomen is flat.     Palpations: Abdomen is soft.     Tenderness: There is no abdominal tenderness. There is no guarding or rebound.  Musculoskeletal:     Right lower leg: Edema present.     Left lower leg: Edema present.  Skin:    General: Skin is warm and dry.     Capillary Refill: Capillary refill takes less than 2 seconds.  Neurological:     Mental Status: She is alert.     GCS: GCS eye subscore is 4. GCS verbal subscore is 4. GCS motor subscore is 6.     Cranial Nerves: Cranial nerves 2-12 are intact.     Sensory: Sensation is intact.     Motor: Motor function is intact.     Coordination: Coordination is intact.     Comments: Aox1 to self only (baseline) Variable compliance with neuro testing Gait not tested 2/2 pt safety (walker at baseline)  Psychiatric:        Mood and Affect: Mood normal.        Behavior: Behavior normal.     ED Results and Treatments Labs (all labs ordered are listed, but only abnormal results are displayed) Labs Reviewed  SARS CORONAVIRUS 2 BY RT PCR - Abnormal; Notable for the  following components:      Result Value   SARS Coronavirus 2 by RT PCR POSITIVE (*)    All other components within normal limits  CBC WITH DIFFERENTIAL/PLATELET - Abnormal; Notable for the following components:   Abs Immature Granulocytes 0.17 (*)    All other components within normal limits  BRAIN NATRIURETIC PEPTIDE - Abnormal; Notable for the following components:   B Natriuretic Peptide 321.4 (*)    All other components within normal limits  COMPREHENSIVE METABOLIC PANEL - Abnormal; Notable for the following components:   Glucose, Bld 129 (*)    BUN 46 (*)    Creatinine, Ser 1.57 (*)    Calcium 8.8 (*)    Albumin 3.1 (*)    GFR, Estimated 30 (*)    All other components within normal limits  I-STAT VENOUS BLOOD GAS, ED - Abnormal; Notable for the following components:   pH, Ven 7.441 (*)    pCO2, Ven 34.4 (*)    Calcium, Ion 1.00 (*)    All other components within normal limits  BLOOD GAS, VENOUS  TROPONIN I (HIGH SENSITIVITY)  TROPONIN I (HIGH SENSITIVITY)  Radiology DG Chest Port 1 View  Result Date: 12/19/2022 CLINICAL DATA:  Short of breath EXAM: PORTABLE CHEST 1 VIEW COMPARISON:  08/13/2022 FINDINGS: Possible airspace disease at the left base. Right lung grossly clear. Stable cardiomediastinal silhouette with aortic atherosclerosis. No pneumothorax IMPRESSION: Possible airspace disease and effusion at the left base. Electronically Signed   By: Donavan Foil M.D.   On: 12/19/2022 18:30    Pertinent labs & imaging results that were available during my care of the patient were reviewed by me and considered in my medical decision making (see MDM for details).  Medications Ordered in ED Medications  cefTRIAXone (ROCEPHIN) 1 g in sodium chloride 0.9 % 100 mL IVPB (0 g Intravenous Stopped 12/19/22 2049)  azithromycin (ZITHROMAX) 500 mg in sodium chloride 0.9 %  250 mL IVPB (0 mg Intravenous Stopped 12/19/22 2228)  furosemide (LASIX) injection 20 mg (20 mg Intravenous Given 12/19/22 2230)  dexamethasone (DECADRON) tablet 6 mg (6 mg Oral Given 12/19/22 2229)                                                                                                                                     Procedures .Critical Care  Performed by: Jeanell Sparrow, DO Authorized by: Jeanell Sparrow, DO   Critical care provider statement:    Critical care time (minutes):  35   Critical care time was exclusive of:  Separately billable procedures and treating other patients   Critical care was necessary to treat or prevent imminent or life-threatening deterioration of the following conditions:  Respiratory failure   Critical care was time spent personally by me on the following activities:  Development of treatment plan with patient or surrogate, discussions with consultants, evaluation of patient's response to treatment, examination of patient, ordering and review of laboratory studies, ordering and review of radiographic studies, ordering and performing treatments and interventions, pulse oximetry, re-evaluation of patient's condition, review of old charts and obtaining history from patient or surrogate   Care discussed with: admitting provider     (including critical care time)  Medical Decision Making / ED Course    Medical Decision Making:    Erin Ryan is a 89 y.o. female with past medical history as below, significant for A-fib, dementia, hard of hearing, HLD, HTN, hypothyroid who presents to the ED with complaint of exertional dyspnea.. The complaint involves an extensive differential diagnosis and also carries with it a high risk of complications and morbidity.  Serious etiology was considered. Ddx includes but is not limited to: In my evaluation of this patient's dyspnea my DDx includes, but is not limited to, pneumonia, pulmonary embolism, pneumothorax, pulmonary  edema, metabolic acidosis, asthma, COPD, cardiac cause, anemia, anxiety, etc.    Complete initial physical exam performed, notably the patient  was on nasal cannula 1L, sleepy but o/w at her baseline per family at bedside.    Reviewed and confirmed nursing documentation  for past medical history, family history, social history.  Vital signs reviewed.    Clinical Course as of 12/19/22 2237  Fri Dec 19, 2022  1813 Pulse ox 91% on RA, improved to 97% on 1L [place] [SG]  1932 Creatinine(!): 1.57 Gradual increase in Cr over last 4 mos, baseline around 1.2 - 1.3 [SG]  2235 D/w [facility] who accepts pt for admit  [SG]    Clinical Course User Index [SG] Jeanell Sparrow, DO   Patient 2 L nasal cannula, on room air she will desat to 85 to 87%.  Typically does not wear oxygen. Pulmonary infiltrates on chest x-ray, Rocephin azithromycin restarted Following this her COVID-19 test resulted as found be positive.  Patient with hypoxia likely secondary to COVID-19, UA is still pending. Give decadron given covid/hypoxia Lasix given CHF exacerbation Recommend admission for respiratory failure, patient family agreeable     Additional history obtained: -Additional history obtained from family -External records from outside source obtained and reviewed including: Chart review including previous notes, labs, imaging, consultation notes including primary care documentation, home medications, prior labs and imaging   Lab Tests: -I ordered, reviewed, and interpreted labs.   The pertinent results include:   Labs Reviewed  SARS CORONAVIRUS 2 BY RT PCR - Abnormal; Notable for the following components:      Result Value   SARS Coronavirus 2 by RT PCR POSITIVE (*)    All other components within normal limits  CBC WITH DIFFERENTIAL/PLATELET - Abnormal; Notable for the following components:   Abs Immature Granulocytes 0.17 (*)    All other components within normal limits  BRAIN NATRIURETIC PEPTIDE - Abnormal; Notable  for the following components:   B Natriuretic Peptide 321.4 (*)    All other components within normal limits  COMPREHENSIVE METABOLIC PANEL - Abnormal; Notable for the following components:   Glucose, Bld 129 (*)    BUN 46 (*)    Creatinine, Ser 1.57 (*)    Calcium 8.8 (*)    Albumin 3.1 (*)    GFR, Estimated 30 (*)    All other components within normal limits  I-STAT VENOUS BLOOD GAS, ED - Abnormal; Notable for the following components:   pH, Ven 7.441 (*)    pCO2, Ven 34.4 (*)    Calcium, Ion 1.00 (*)    All other components within normal limits  BLOOD GAS, VENOUS  TROPONIN I (HIGH SENSITIVITY)  TROPONIN I (HIGH SENSITIVITY)    Notable for BNP + covid +  EKG   EKG Interpretation  Date/Time:  Friday December 19 2022 17:33:21 EDT Ventricular Rate:  74 PR Interval:  184 QRS Duration: 88 QT Interval:  394 QTC Calculation: 438 R Axis:   15 Text Interpretation: Sinus rhythm Abnormal R-wave progression, early transition no stemi Confirmed by Wynona Dove ([id number]) on 12/19/2022 5:56:06 PM         Imaging Studies ordered: I ordered imaging studies including cxr I independently visualized the following imaging with scope of interpretation limited to determining acute life threatening conditions related to emergency care; findings noted above, significant for pleural eff, infiltrate I independently visualized and interpreted imaging. I agree with the radiologist interpretation   Medicines ordered and prescription drug management: Meds ordered this encounter  Medications   cefTRIAXone (ROCEPHIN) 1 g in sodium chloride 0.9 % 100 mL IVPB    Order Specific Question:   Antibiotic Indication:    Answer:   CAP   azithromycin (ZITHROMAX) 500 mg in sodium chloride 0.9 %  250 mL IVPB    Order Specific Question:   Antibiotic Indication:    Answer:   CAP   furosemide (LASIX) injection 20 mg   dexamethasone (DECADRON) tablet 6 mg    -I have reviewed the patients home medicines and have  made adjustments as needed   Consultations Obtained: na   Cardiac Monitoring: The patient was maintained on a cardiac monitor.  I personally viewed and interpreted the cardiac monitored which showed an underlying rhythm of: SR  Social Determinants of Health:  Diagnosis or treatment significantly limited by social determinants of health: non smoker   Reevaluation: After the interventions noted above, I reevaluated the patient and found that they have improved  Co morbidities that complicate the patient evaluation  Past Medical History:  Diagnosis Date   A-fib [facility])    Anxiety    Arthritis    COVID-19    04/2021   GERD (gastroesophageal reflux disease)    Hearing loss    has hearing aids   High cholesterol    Hypertension    Hypothyroid    Osteoporosis    Scoliosis    Thyroid disease       Dispostion: Disposition decision including need for hospitalization was considered, and patient admitted to the hospital.    Final Clinical Impression(s) / ED Diagnoses Final diagnoses:  Community acquired pneumonia, unspecified laterality  Respiratory distress  COVID-19  Acute on chronic congestive heart failure, unspecified heart failure type ([place])     This chart was dictated using voice recognition software.  Despite best efforts to proofread,  errors can occur which can change the documentation meaning.    Jeanell Sparrow, DO 12/19/22 2237

## 2022-12-19 NOTE — Telephone Encounter (Signed)
Sharee Holster, RN 12/19/2022 11:51 AM EDT Back to Top    Called to give the information, no answer, left message   Sanda Klein, MD 12/19/2022  7:49 AM EDT     Labs show slightly worse kidney function compared to previous baseline, suggesting she is a little "dry". Furosemide 20 mg every other day seems to be insufficient diuretic and 20 mg daily may be a little bit too much.  I say lets put a difference and have her take furosemide 20 mg 5 days a week, with the option to take the furosemide on the "skip" days if she has edema or her weight is above 128 pounds.

## 2022-12-19 NOTE — Addendum Note (Signed)
Addended by: Sharee Holster on: 12/19/2022 12:48 PM   Modules accepted: Orders

## 2022-12-19 NOTE — ED Notes (Signed)
ED TO INPATIENT HANDOFF REPORT  ED Nurse Name and Phone #:  Oliverio Cho [phone]  S Name/Age/Gender Erin Ryan 89 y.o. female Room/Bed: 005C/005C  Code Status   Code Status: Full Code  Home/SNF/Other Home Patient oriented to: self Is this baseline? Yes   Triage Complete: Triage complete  Chief Complaint COVID-19 [U07.1]  Triage Note Pt BIB EMS from home for SOB with exertion and weakness. A&Ox self only. HX dementia (family does not tell pt), COPD & CHF. Pt daughter on the way.   EMS VS 180/80 100 % 74 HR   Allergies Allergies  Allergen Reactions   Ambrosia Trifida (Tall Ragweed) Allergy Skin Test Other (See Comments)    Other reaction(s): Other   Hydrochlorothiazide     hyponatremia   Lyrica [Pregabalin] Other (See Comments)    Severe confusion   Diltiazem Hcl Er Rash   Short Ragweed Pollen Ext     Other reaction(s): Other    Level of Care/Admitting Diagnosis ED Disposition     ED Disposition  Admit   Condition  --   Comment  Hospital Area: [facility] [[id number]]  Level of Care: Telemetry Cardiac [[id number]]  May admit patient to [facility] or [facility] if equivalent level of care is available:: Yes  Covid Evaluation: Confirmed COVID Positive  Diagnosis: COVID-19 [id number]  Admitting Physician: Clance Boll [id number]  Attending Physician: Clance Boll [id number]  Certification:: I certify this patient will need inpatient services for at least 2 midnights  Estimated Length of Stay: 3          B Medical/Surgery History Past Medical History:  Diagnosis Date   A-fib [facility])    Anxiety    Arthritis    COVID-19    04/2021   GERD (gastroesophageal reflux disease)    Hearing loss    has hearing aids   High cholesterol    Hypertension    Hypothyroid    Osteoporosis    Scoliosis    Thyroid disease    Past Surgical History:  Procedure Laterality Date   CATARACT EXTRACTION     cysts removed from bilateral breasts      THYROIDECTOMY       A IV Location/Drains/Wounds Patient Lines/Drains/Airways Status     Active Line/Drains/Airways     Name Placement date Placement time Site Days   Peripheral IV 08/10/22 20 G Distal;Posterior;Right Forearm 08/10/22  1425  Forearm  131   Peripheral IV 12/19/22 20 G Posterior;Right Forearm 12/19/22  1752  Forearm  less than 1   External Urinary Catheter 08/18/22  0930  --  123            Intake/Output Last 24 hours  Intake/Output Summary (Last 24 hours) at 12/19/2022 2343 Last data filed at 12/19/2022 2228 Gross per 24 hour  Intake 350 ml  Output --  Net 350 ml    Labs/Imaging Results for orders placed or performed during the hospital encounter of 12/19/22 (from the past 48 hour(s))  Brain natriuretic peptide     Status: Abnormal   Collection Time: 12/19/22  5:52 PM  Result Value Ref Range   B Natriuretic Peptide 321.4 (H) 0.0 - 100.0 pg/mL    Comment: Performed at [facility], [address] [place], [place] [address]  SARS Coronavirus 2 by RT PCR (hospital order, performed in [facility] hospital lab) *cepheid single result test* Anterior Nasal Swab     Status: Abnormal   Collection Time: 12/19/22  5:52  PM   Specimen: Anterior Nasal Swab  Result Value Ref Range   SARS Coronavirus 2 by RT PCR POSITIVE (A) NEGATIVE    Comment: Performed at [facility], [place] [address] [place], [place] [address]  CBC with Differential     Status: Abnormal   Collection Time: 12/19/22  5:57 PM  Result Value Ref Range   WBC 8.4 4.0 - 10.5 K/uL   RBC 4.28 3.87 - 5.11 MIL/uL   Hemoglobin 13.3 12.0 - 15.0 g/dL   HCT 41.1 36.0 - 46.0 %   MCV 96.0 80.0 - 100.0 fL   MCH 31.1 26.0 - 34.0 pg   MCHC 32.4 30.0 - 36.0 g/dL   RDW 15.1 11.5 - 15.5 %   Platelets 258 150 - 400 K/uL   nRBC 0.0 0.0 - 0.2 %   Neutrophils Relative % 73 %   Neutro Abs 6.1 1.7 - 7.7 K/uL   Lymphocytes Relative 10 %   Lymphs Abs 0.9 0.7 - 4.0 K/uL   Monocytes Relative 10 %   Monocytes  Absolute 0.8 0.1 - 1.0 K/uL   Eosinophils Relative 4 %   Eosinophils Absolute 0.4 0.0 - 0.5 K/uL   Basophils Relative 1 %   Basophils Absolute 0.1 0.0 - 0.1 K/uL   Immature Granulocytes 2 %   Abs Immature Granulocytes 0.17 (H) 0.00 - 0.07 K/uL    Comment: Performed at [place] [facility] [address], [place], [place] [address]  Troponin I (High Sensitivity)     Status: None   Collection Time: 12/19/22  5:57 PM  Result Value Ref Range   Troponin I (High Sensitivity) 16 <18 ng/L    Comment: (NOTE) Elevated high sensitivity troponin I (hsTnI) values and significant  changes across serial measurements may suggest ACS but many other  chronic and acute conditions are known to elevate hsTnI results.  Refer to the "Links" section for chest pain algorithms and additional  guidance. Performed at [facility], [place] [address], [place], [place] [address]   Comprehensive metabolic panel     Status: Abnormal   Collection Time: 12/19/22  5:57 PM  Result Value Ref Range   Sodium 137 135 - 145 mmol/L   Potassium 4.2 3.5 - 5.1 mmol/L   Chloride 100 98 - 111 mmol/L   CO2 22 22 - 32 mmol/L   Glucose, Bld 129 (H) 70 - 99 mg/dL    Comment: Glucose reference range applies only to samples taken after fasting for at least 8 hours.   BUN 46 (H) 8 - 23 mg/dL   Creatinine, Ser 1.57 (H) 0.44 - 1.00 mg/dL   Calcium 8.8 (L) 8.9 - 10.3 mg/dL   Total Protein 7.1 6.5 - 8.1 g/dL   Albumin 3.1 (L) 3.5 - 5.0 g/dL   AST 37 15 - 41 U/L   ALT 24 0 - 44 U/L   Alkaline Phosphatase 70 38 - 126 U/L   Total Bilirubin 0.4 0.3 - 1.2 mg/dL   GFR, Estimated 30 (L) >60 mL/min    Comment: (NOTE) Calculated using the CKD-EPI Creatinine Equation (2021)    Anion gap 15 5 - 15    Comment: Performed at [facility], [place] [address] [place], [place] [address]  I-Stat venous blood gas, ED     Status: Abnormal   Collection Time: 12/19/22  6:08 PM  Result Value Ref Range   pH, Ven 7.441 (H) 7.25 - 7.43   pCO2,  Ven  34.4 (L) 44 - 60 mmHg   pO2, Ven 44 32 - 45 mmHg   Bicarbonate 23.4 20.0 - 28.0 mmol/L   TCO2 24 22 - 32 mmol/L   O2 Saturation 82 %   Acid-Base Excess 0.0 0.0 - 2.0 mmol/L   Sodium 136 135 - 145 mmol/L   Potassium 4.1 3.5 - 5.1 mmol/L   Calcium, Ion 1.00 (L) 1.15 - 1.40 mmol/L   HCT 40.0 36.0 - 46.0 %   Hemoglobin 13.6 12.0 - 15.0 g/dL   Sample type VENOUS   Troponin I (High Sensitivity)     Status: Abnormal   Collection Time: 12/19/22  7:52 PM  Result Value Ref Range   Troponin I (High Sensitivity) 18 (H) <18 ng/L    Comment: (NOTE) Elevated high sensitivity troponin I (hsTnI) values and significant  changes across serial measurements may suggest ACS but many other  chronic and acute conditions are known to elevate hsTnI results.  Refer to the "Links" section for chest pain algorithms and additional  guidance. Performed at [facility], [place] [address] [place], [place] [address]    DG Chest Port 1 View  Result Date: 12/19/2022 CLINICAL DATA:  Short of breath EXAM: PORTABLE CHEST 1 VIEW COMPARISON:  08/13/2022 FINDINGS: Possible airspace disease at the left base. Right lung grossly clear. Stable cardiomediastinal silhouette with aortic atherosclerosis. No pneumothorax IMPRESSION: Possible airspace disease and effusion at the left base. Electronically Signed   By: Donavan Foil M.D.   On: 12/19/2022 18:30    Pending Labs Unresulted Labs (From admission, onward)     Start     Ordered   12/20/22 0500  Comprehensive metabolic panel  Tomorrow morning,   R        12/19/22 2341   12/19/22 2341  TSH  Once,   R        12/19/22 2341   12/19/22 1752  Blood gas, venous (at [facility] and [facility])  Once,   R        12/19/22 1752            Vitals/Pain Today's Vitals   12/19/22 1740 12/19/22 1741 12/19/22 2130 12/19/22 2233  BP: (!) 154/76  (!) 143/78   Pulse: 75  75   Resp: 20  16   Temp: 100.1 F (37.8 C)   99.8 F (37.7 C)  TempSrc: Oral   Oral  SpO2: 95%  96%   Weight:   61.1 kg    Height:  5\' 2"  (1.575 m)    PainSc:        Isolation Precautions Airborne and Contact precautions  Medications Medications  remdesivir 200 mg in sodium chloride 0.9% 250 mL IVPB (has no administration in time range)    Followed by  remdesivir 100 mg in sodium chloride 0.9 % 100 mL IVPB (has no administration in time range)  dexamethasone (DECADRON) tablet 6 mg (has no administration in time range)  furosemide (LASIX) injection 20 mg (has no administration in time range)  acetaminophen (TYLENOL) tablet 650 mg (has no administration in time range)    Or  acetaminophen (TYLENOL) suppository 650 mg (has no administration in time range)  ondansetron (ZOFRAN) tablet 4 mg (has no administration in time range)    Or  ondansetron (ZOFRAN) injection 4 mg (has no administration in time range)  albuterol (PROVENTIL) (2.5 MG/3ML) 0.083% nebulizer solution 2.5 mg (has no administration in time range)  cefTRIAXone (ROCEPHIN) 1 g in sodium chloride 0.9 % 100  mL IVPB (0 g Intravenous Stopped 12/19/22 2049)  azithromycin (ZITHROMAX) 500 mg in sodium chloride 0.9 % 250 mL IVPB (0 mg Intravenous Stopped 12/19/22 2228)  furosemide (LASIX) injection 20 mg (20 mg Intravenous Given 12/19/22 2230)  dexamethasone (DECADRON) tablet 6 mg (6 mg Oral Given 12/19/22 2229)    Mobility walks with person assist       R Recommendations: See Admitting Provider Note  Report given to:   Additional Notes: COVID +, getting IV lasix, A&Ox1, SOB and weakness on exertion, daughter at bedside

## 2022-12-19 NOTE — ED Triage Notes (Signed)
Pt BIB EMS from home for SOB with exertion and weakness. A&Ox self only. HX dementia (family does not tell pt), COPD & CHF. Pt daughter on the way.   EMS VS 180/80 100 % 74 HR

## 2022-12-19 NOTE — Telephone Encounter (Signed)
Late entry; orders for pt to come in for lab work in 3 weeks= BMET, BNP- orders placed. Pt's daughter verbalized understanding.

## 2022-12-19 NOTE — Addendum Note (Signed)
Addended by: Sharee Holster on: 12/19/2022 12:42 PM   Modules accepted: Orders

## 2022-12-19 NOTE — Telephone Encounter (Signed)
Pt's daughter called back- said that her swelling is pretty bad- feet and ankles are aweful, pt was out of breath a little this morning too, but not short of breath at this time.   Spoke with Dr Sallyanne Kuster- he said since she is so swollen, continue taking Lasix daily. Orders to cut Amlodipine in half= 2.5mg  daily. Make sure pt is keeping legs elevated several times daily, wear compression hose when sitting and walking around. Given ER precautions. She verbalized understanding. She plans to call on Monday with an update on the patient.

## 2022-12-19 NOTE — H&P (Signed)
History and Physical    Erin Ryan [id number] DOB: [birth year] DOA: 12/19/2022  PCP: Harlan Stains, MD  Patient coming from: home  I have personally briefly reviewed patient's old medical records in [facility]  Chief Complaint:   HPI: Erin Ryan is a 89 y.o. female with medical history significant of Afib, hypertension, HLD, hypothyroidism, Anxiety , arthritis, GERD, hx of PE, CKDIIIa who presents to ED BIB EMS with SOB/DOE , weakness, confusion x 2 -3 days.   ED Course:  Vitla: 100.1, BP 154/76,  hr 75, rr 20  Respiratory panel + covid  BNP 321.4 ( prior 523) Wbc 8.4, hgb 13.3, plt 258,  Trop 16 ,18 Na 137, K 4.2, glu 129 , cr 1.57 ( 1.51)  Vbg  ( 7.44/ 34)  [id number]: Possible airspace disease and effusion at the left base. Tx; ceftriaxone , azithromycin, dexamethazone 6mg   lasix 20 mg  [id number] rhythm   Review of Systems: As per HPI otherwise 10 point review of systems negative.   Past Medical History:  Diagnosis Date   A-fib [facility])    Anxiety    Arthritis    COVID-19    04/2021   GERD (gastroesophageal reflux disease)    Hearing loss    has hearing aids   High cholesterol    Hypertension    Hypothyroid    Osteoporosis    Scoliosis    Thyroid disease     Past Surgical History:  Procedure Laterality Date   CATARACT EXTRACTION     cysts removed from bilateral breasts     THYROIDECTOMY       reports that she has never smoked. She has never used smokeless tobacco. She reports that she does not drink alcohol and does not use drugs.  Allergies  Allergen Reactions   Ambrosia Trifida (Tall Ragweed) Allergy Skin Test Other (See Comments)    Other reaction(s): Other   Hydrochlorothiazide     hyponatremia   Lyrica [Pregabalin] Other (See Comments)    Severe confusion   Diltiazem Hcl Er Rash   Short Ragweed Pollen Ext     Other reaction(s): Other    Family History  Problem Relation Age of Onset   Heart disease Mother 29       Heart  attack   Bladder Cancer Mother    Heart disease Father    Asthma Father    Heart attack Brother        Age undetermined    Prior to Admission medications   Medication Sig Start Date End Date Taking? Authorizing Provider  Acetaminophen (TYLENOL EXTRA STRENGTH PO) Take 1,000 mg by mouth 3 (three) times daily.    [provider]  amLODipine (NORVASC) 2.5 MG tablet Take 1 tablet (2.5 mg total) by mouth daily. 12/19/22   Croitoru, Mihai, MD  apixaban (ELIQUIS) 2.5 MG TABS tablet Take 1 tablet (2.5 mg total) by mouth 2 (two) times daily. 11/28/22   Croitoru, Mihai, MD  atorvastatin (LIPITOR) 40 MG tablet Take 40 mg by mouth daily.    [provider]  Calcium Citrate-Vitamin D (CALCIUM CITRATE + PO) Take 1 tablet 2 (two) times daily by mouth.    [provider]  denosumab (PROLIA) 60 MG/ML SOSY injection Inject 60 mg into the skin every 6 (six) months. 06/24/17   [provider]  donepezil (ARICEPT) 10 MG tablet Take 1 tablet (10 mg total) by mouth at bedtime. 05/20/22   Ward Givens, NP  DULoxetine (CYMBALTA) 30 MG capsule Take  30 mg by mouth daily. 09/06/21   [provider]  furosemide (LASIX) 20 MG tablet Take 1 tablet (20 mg total) by mouth daily. 12/19/22   Croitoru, Mihai, MD  gabapentin (NEURONTIN) 100 MG capsule Take 100 mg by mouth at bedtime. 05/07/22   [provider]  lactose free nutrition (BOOST PLUS) LIQD Take 237 mLs by mouth 2 (two) times daily between meals. 08/19/22   Eugenie Filler, MD  levothyroxine (SYNTHROID) 100 MCG tablet Take 1 tablet (100 mcg total) by mouth in the morning. DAW1 Synthroid 08/19/22   Eugenie Filler, MD  Metoprolol Tartrate 75 MG TABS Take 1 tablet (75 mg total) by mouth 2 (two) times daily. 11/28/22   Croitoru, Mihai, MD  mirtazapine (REMERON) 15 MG tablet Take 15 mg by mouth at bedtime. 10/08/21   [provider]  Multiple Vitamins-Minerals (CENTRUM SILVER PO) Take 1 tablet by mouth daily.     [provider]  omeprazole (PRILOSEC) 40 MG capsule Take 1 capsule (40 mg total) by mouth 2 (two) times daily before a meal. 09/11/17   Tanda Rockers, MD  polyvinyl alcohol (LIQUIFILM TEARS) 1.4 % ophthalmic solution Place 1 drop into both eyes as needed for dry eyes. 08/19/22   Eugenie Filler, MD    Physical Exam: Vitals:   12/19/22 1740 12/19/22 1741 12/19/22 2130 12/19/22 2233  BP: (!) 154/76  (!) 143/78   Pulse: 75  75   Resp: 20  16   Temp: 100.1 F (37.8 C)   99.8 F (37.7 C)  TempSrc: Oral   Oral  SpO2: 95%  96%   Weight:  61.1 kg    Height:  5\' 2"  (1.575 m)      Constitutional: NAD, calm, comfortable Vitals:   12/19/22 1740 12/19/22 1741 12/19/22 2130 12/19/22 2233  BP: (!) 154/76  (!) 143/78   Pulse: 75  75   Resp: 20  16   Temp: 100.1 F (37.8 C)   99.8 F (37.7 C)  TempSrc: Oral   Oral  SpO2: 95%  96%   Weight:  61.1 kg    Height:  5\' 2"  (1.575 m)     Eyes: PERRL, lids and conjunctivae normal ENMT: Mucous membranes are moist. Posterior pharynx clear of any exudate or lesions.Normal dentition.  Neck: normal, supple, no masses, no thyromegaly Respiratory: clear to auscultation bilaterally, no wheezing, no crackles. Normal respiratory effort. No accessory muscle use.  Cardiovascular: Regular rate and rhythm, no murmurs / rubs / gallops. No extremity edema. 2+ pedal pulses. No carotid bruits.  Abdomen: no tenderness, no masses palpated. No hepatosplenomegaly. Bowel sounds positive.  Musculoskeletal: no clubbing / cyanosis. No joint deformity upper and lower extremities. Good ROM, no contractures. Normal muscle tone.  Skin: no rashes, lesions, ulcers. No induration Neurologic: CN 2-12 grossly intact. Sensation intact, DTR normal. Strength 5/5 in all 4.  Psychiatric: Normal judgment and insight. Alert and oriented x 3. Normal mood.    Labs on Admission: I have personally reviewed following labs and imaging studies  CBC: Recent Labs  Lab  12/19/22 1757 12/19/22 1808  WBC 8.4  --   NEUTROABS 6.1  --   HGB 13.3 13.6  HCT 41.1 40.0  MCV 96.0  --   PLT 258  --    Basic Metabolic Panel: Recent Labs  Lab 12/18/22 1553 12/19/22 1757 12/19/22 1808  NA 141 137 136  K 4.8 4.2 4.1  CL 102 100  --   CO2 20  22  --   GLUCOSE 109* 129*  --   BUN 48* 46*  --   CREATININE 1.51* 1.57*  --   CALCIUM 8.9 8.8*  --    GFR: Estimated Creatinine Clearance: 18.4 mL/min (A) (by C-G formula based on SCr of 1.57 mg/dL (H)). Liver Function Tests: Recent Labs  Lab 12/19/22 1757  AST 37  ALT 24  ALKPHOS 70  BILITOT 0.4  PROT 7.1  ALBUMIN 3.1*   No results for input(s): "LIPASE", "AMYLASE" in the last 168 hours. No results for input(s): "AMMONIA" in the last 168 hours. Coagulation Profile: No results for input(s): "INR", "PROTIME" in the last 168 hours. Cardiac Enzymes: No results for input(s): "CKTOTAL", "CKMB", "CKMBINDEX", "TROPONINI" in the last 168 hours. BNP (last 3 results) No results for input(s): "PROBNP" in the last 8760 hours. HbA1C: No results for input(s): "HGBA1C" in the last 72 hours. CBG: No results for input(s): "GLUCAP" in the last 168 hours. Lipid Profile: No results for input(s): "CHOL", "HDL", "LDLCALC", "TRIG", "CHOLHDL", "LDLDIRECT" in the last 72 hours. Thyroid Function Tests: No results for input(s): "TSH", "T4TOTAL", "FREET4", "T3FREE", "THYROIDAB" in the last 72 hours. Anemia Panel: No results for input(s): "VITAMINB12", "FOLATE", "FERRITIN", "TIBC", "IRON", "RETICCTPCT" in the last 72 hours. Urine analysis:    Component Value Date/Time   COLORURINE YELLOW 08/10/2022 1153   APPEARANCEUR CLOUDY (A) 08/10/2022 1153   LABSPEC 1.015 08/10/2022 1153   PHURINE 6.5 08/10/2022 1153   GLUCOSEU NEGATIVE 08/10/2022 1153   HGBUR TRACE (A) 08/10/2022 1153   BILIRUBINUR NEGATIVE 08/10/2022 1153   KETONESUR NEGATIVE 08/10/2022 1153   PROTEINUR 30 (A) 08/10/2022 1153   NITRITE NEGATIVE 08/10/2022 1153    LEUKOCYTESUR LARGE (A) 08/10/2022 1153    Radiological Exams on Admission: DG Chest Port 1 View  Result Date: 12/19/2022 CLINICAL DATA:  Short of breath EXAM: PORTABLE CHEST 1 VIEW COMPARISON:  08/13/2022 FINDINGS: Possible airspace disease at the left base. Right lung grossly clear. Stable cardiomediastinal silhouette with aortic atherosclerosis. No pneumothorax IMPRESSION: Possible airspace disease and effusion at the left base. Electronically Signed   By: Donavan Foil M.D.   On: 12/19/2022 18:30    EKG: Independently reviewed. See above   Assessment/Plan COVID viral Pneumonia with associated hypoxic respiratory failure  -decadron 6mg  po x 10 days  - remdesivir per protcol   -patient currently with low O2 requirement  continue monitor  -daily monitoring of COVID labs  -dvt ppx per protocol   Mild Acute diastolic CHF exacerbation - patient with noted increase lower extremity swelling despite increase in lasix as out patient  - s/p lasix 20 mg iv in ED  - continue on iv lasix overnight transition back to oral regimen as able  -last ecoh 11/23 noted grade II diastolic dysfunction   Afib -continue  on metoprolol  -continue Eliquis   Hypertension -continue amlodipine    HLD -continue atorvastatin     Hypothyroidism -continue synthroid    Anxiety -continue remeron    GERD, -ppi    Hx of PE - on Elilquis   CKDIIIa -at baseline     DVT prophylaxis: on Eliquis Code Status Family Communication:  Disposition Plan: patient  expected to be admitted greater than 2 midnights  Consults called: n/a Admission status: cardiac tele  Clance Boll MD [facility]  If 7PM-7AM, please contact night-coverage [email or web address] Password [email or web address]  12/19/2022, 11:04 PM

## 2022-12-20 ENCOUNTER — Inpatient Hospital Stay (HOSPITAL_COMMUNITY): Payer: Medicare Other

## 2022-12-20 DIAGNOSIS — Z7189 Other specified counseling: Secondary | ICD-10-CM

## 2022-12-20 DIAGNOSIS — Z86711 Personal history of pulmonary embolism: Secondary | ICD-10-CM

## 2022-12-20 DIAGNOSIS — I480 Paroxysmal atrial fibrillation: Secondary | ICD-10-CM

## 2022-12-20 DIAGNOSIS — I5031 Acute diastolic (congestive) heart failure: Secondary | ICD-10-CM | POA: Diagnosis not present

## 2022-12-20 DIAGNOSIS — I5033 Acute on chronic diastolic (congestive) heart failure: Secondary | ICD-10-CM | POA: Diagnosis not present

## 2022-12-20 DIAGNOSIS — J1282 Pneumonia due to coronavirus disease 2019: Secondary | ICD-10-CM

## 2022-12-20 DIAGNOSIS — Z66 Do not resuscitate: Secondary | ICD-10-CM | POA: Diagnosis not present

## 2022-12-20 DIAGNOSIS — K219 Gastro-esophageal reflux disease without esophagitis: Secondary | ICD-10-CM

## 2022-12-20 DIAGNOSIS — U071 COVID-19: Secondary | ICD-10-CM | POA: Diagnosis not present

## 2022-12-20 DIAGNOSIS — N1832 Chronic kidney disease, stage 3b: Secondary | ICD-10-CM

## 2022-12-20 DIAGNOSIS — E039 Hypothyroidism, unspecified: Secondary | ICD-10-CM

## 2022-12-20 DIAGNOSIS — I10 Essential (primary) hypertension: Secondary | ICD-10-CM

## 2022-12-20 DIAGNOSIS — Z515 Encounter for palliative care: Secondary | ICD-10-CM

## 2022-12-20 DIAGNOSIS — R531 Weakness: Secondary | ICD-10-CM

## 2022-12-20 DIAGNOSIS — F03B0 Unspecified dementia, moderate, without behavioral disturbance, psychotic disturbance, mood disturbance, and anxiety: Secondary | ICD-10-CM

## 2022-12-20 LAB — BLOOD GAS, VENOUS
Acid-Base Excess: 4.4 mmol/L — ABNORMAL HIGH (ref 0.0–2.0)
Bicarbonate: 29.2 mmol/L — ABNORMAL HIGH (ref 20.0–28.0)
Drawn by: 66167
O2 Saturation: 81.5 %
Patient temperature: 36.7
pCO2, Ven: 42 mmHg — ABNORMAL LOW (ref 44–60)
pH, Ven: 7.44 — ABNORMAL HIGH (ref 7.25–7.43)
pO2, Ven: 46 mmHg — ABNORMAL HIGH (ref 32–45)

## 2022-12-20 LAB — COMPREHENSIVE METABOLIC PANEL
ALT: 24 U/L (ref 0–44)
AST: 27 U/L (ref 15–41)
Albumin: 2.6 g/dL — ABNORMAL LOW (ref 3.5–5.0)
Alkaline Phosphatase: 60 U/L (ref 38–126)
Anion gap: 16 — ABNORMAL HIGH (ref 5–15)
BUN: 40 mg/dL — ABNORMAL HIGH (ref 8–23)
CO2: 23 mmol/L (ref 22–32)
Calcium: 7.9 mg/dL — ABNORMAL LOW (ref 8.9–10.3)
Chloride: 99 mmol/L (ref 98–111)
Creatinine, Ser: 1.51 mg/dL — ABNORMAL HIGH (ref 0.44–1.00)
GFR, Estimated: 32 mL/min — ABNORMAL LOW (ref >60)
Glucose, Bld: 134 mg/dL — ABNORMAL HIGH (ref 70–99)
Potassium: 3.6 mmol/L (ref 3.5–5.1)
Sodium: 138 mmol/L (ref 135–145)
Total Bilirubin: 0.4 mg/dL (ref 0.3–1.2)
Total Protein: 6 g/dL — ABNORMAL LOW (ref 6.5–8.1)

## 2022-12-20 LAB — URINALYSIS, ROUTINE W REFLEX MICROSCOPIC
Bilirubin Urine: NEGATIVE
Glucose, UA: NEGATIVE mg/dL
Hgb urine dipstick: NEGATIVE
Ketones, ur: NEGATIVE mg/dL
Nitrite: NEGATIVE
Protein, ur: NEGATIVE mg/dL
Specific Gravity, Urine: 1.014 (ref 1.005–1.030)
WBC, UA: >50 WBC/hpf (ref 0–5)
pH: 5 (ref 5.0–8.0)

## 2022-12-20 LAB — ECHOCARDIOGRAM COMPLETE
AR max vel: 1.79 cm2
AV Area VTI: 1.7 cm2
AV Area mean vel: 1.68 cm2
AV Mean grad: 4 mmHg
AV Peak grad: 6.3 mmHg
Ao pk vel: 1.26 m/s
Area-P 1/2: 3.08 cm2
Height: 62 in
S' Lateral: 1.9 cm
Weight: 2169.6 oz

## 2022-12-20 LAB — TSH: TSH: 0.696 u[IU]/mL (ref 0.350–4.500)

## 2022-12-20 MED ORDER — DULOXETINE HCL 30 MG PO CPEP
30.0000 mg | ORAL_CAPSULE | Freq: Every day | ORAL | Status: DC
  Administered 2022-12-20 – 2022-12-23 (×4): 30 mg via ORAL
  Filled 2022-12-20 (×4): qty 1

## 2022-12-20 MED ORDER — CHLORHEXIDINE GLUCONATE CLOTH 2 % EX PADS
6.0000 | MEDICATED_PAD | Freq: Every day | CUTANEOUS | Status: DC
  Administered 2022-12-20 – 2022-12-23 (×4): 6 via TOPICAL

## 2022-12-20 MED ORDER — APIXABAN 2.5 MG PO TABS
2.5000 mg | ORAL_TABLET | Freq: Two times a day (BID) | ORAL | Status: DC
  Administered 2022-12-20 – 2022-12-23 (×7): 2.5 mg via ORAL
  Filled 2022-12-20 (×7): qty 1

## 2022-12-20 MED ORDER — SODIUM CHLORIDE 0.9 % IV SOLN
1.0000 g | INTRAVENOUS | Status: AC
  Administered 2022-12-20 – 2022-12-22 (×3): 1 g via INTRAVENOUS
  Filled 2022-12-20 (×3): qty 10

## 2022-12-20 MED ORDER — POLYVINYL ALCOHOL 1.4 % OP SOLN
1.0000 [drp] | OPHTHALMIC | Status: DC | PRN
  Filled 2022-12-20: qty 15

## 2022-12-20 MED ORDER — FUROSEMIDE 40 MG PO TABS
40.0000 mg | ORAL_TABLET | Freq: Every day | ORAL | Status: DC
  Administered 2022-12-20: 40 mg via ORAL
  Filled 2022-12-20: qty 1

## 2022-12-20 MED ORDER — LEVOTHYROXINE SODIUM 100 MCG PO TABS
100.0000 ug | ORAL_TABLET | Freq: Every day | ORAL | Status: DC
  Administered 2022-12-20 – 2022-12-23 (×4): 100 ug via ORAL
  Filled 2022-12-20 (×4): qty 1

## 2022-12-20 MED ORDER — PANTOPRAZOLE SODIUM 40 MG PO TBEC
40.0000 mg | DELAYED_RELEASE_TABLET | Freq: Every day | ORAL | Status: DC
  Administered 2022-12-20 – 2022-12-23 (×4): 40 mg via ORAL
  Filled 2022-12-20 (×4): qty 1

## 2022-12-20 MED ORDER — MIRTAZAPINE 7.5 MG PO TABS
7.5000 mg | ORAL_TABLET | Freq: Every day | ORAL | Status: DC
  Administered 2022-12-20 – 2022-12-22 (×3): 7.5 mg via ORAL
  Filled 2022-12-20 (×4): qty 1

## 2022-12-20 MED ORDER — AMLODIPINE BESYLATE 2.5 MG PO TABS
2.5000 mg | ORAL_TABLET | Freq: Every day | ORAL | Status: DC
  Administered 2022-12-20 – 2022-12-23 (×4): 2.5 mg via ORAL
  Filled 2022-12-20 (×4): qty 1

## 2022-12-20 MED ORDER — METOPROLOL TARTRATE 25 MG PO TABS
75.0000 mg | ORAL_TABLET | Freq: Two times a day (BID) | ORAL | Status: DC
  Administered 2022-12-20 – 2022-12-23 (×7): 75 mg via ORAL
  Filled 2022-12-20 (×7): qty 3

## 2022-12-20 MED ORDER — ATORVASTATIN CALCIUM 40 MG PO TABS
40.0000 mg | ORAL_TABLET | Freq: Every day | ORAL | Status: DC
  Administered 2022-12-20 – 2022-12-23 (×4): 40 mg via ORAL
  Filled 2022-12-20 (×4): qty 1

## 2022-12-20 MED ORDER — DONEPEZIL HCL 10 MG PO TABS
10.0000 mg | ORAL_TABLET | Freq: Every day | ORAL | Status: DC
  Administered 2022-12-20 – 2022-12-22 (×3): 10 mg via ORAL
  Filled 2022-12-20 (×3): qty 1

## 2022-12-20 NOTE — Consult Note (Signed)
Palliative Medicine  Name: Erin Ryan Date: 12/20/2022 MRN: [id number]  DOB: 1925/06/24  Patient Care Team: Harlan Stains, MD as PCP - General (Family Medicine) Minus Breeding, MD as PCP - Cardiology (Cardiology) Alphonsa Overall, MD (General Surgery) Druscilla Brownie, MD as Consulting Physician (Dermatology) Elsie Stain, MD as Attending Physician (Pulmonary Disease) Minus Breeding, MD as Consulting Physician (Cardiology)    REASON FOR CONSULTATION: Erin Ryan is a 89 y.o. female with multiple medical problems including dementia, diastolic CHF, history of PE and A-fib on Eliquis, CKD stage IIIb, who was admitted to hospital on 12/19/2022 with COVID-19 pneumonia.  Palliative care was consulted to address goals.  SOCIAL HISTORY:     reports that she has never smoked. She has never used smokeless tobacco. She reports that she does not drink alcohol and does not use drugs.  Patient is widowed.  She lives at home with her daughter who is her only child.  ADVANCE DIRECTIVES:  Reportedly, daughter is HCPOA  CODE STATUS: DNR  PAST MEDICAL HISTORY: Past Medical History:  Diagnosis Date   A-fib [facility]    Anxiety    Arthritis    COVID-19    04/2021   GERD (gastroesophageal reflux disease)    Hearing loss    has hearing aids   High cholesterol    Hypertension    Hypothyroid    Osteoporosis    Scoliosis    Thyroid disease     PAST SURGICAL HISTORY:  Past Surgical History:  Procedure Laterality Date   CATARACT EXTRACTION     cysts removed from bilateral breasts     THYROIDECTOMY      HEMATOLOGY/ONCOLOGY HISTORY:  Oncology History   No history exists.    ALLERGIES:  is allergic to ambrosia trifida (tall ragweed) allergy skin test, hydrochlorothiazide, lyrica [pregabalin], diltiazem hcl er, and short ragweed pollen ext.  MEDICATIONS:  Current Facility-Administered Medications  Medication Dose Route Frequency Provider Last Rate Last Admin   acetaminophen  (TYLENOL) tablet 650 mg  650 mg Oral Q6H PRN Clance Boll, MD       Or   acetaminophen (TYLENOL) suppository 650 mg  650 mg Rectal Q6H PRN Clance Boll, MD       albuterol (PROVENTIL) (2.5 MG/3ML) 0.083% nebulizer solution 2.5 mg  2.5 mg Nebulization Q2H PRN Myles Rosenthal A, MD       amLODipine (NORVASC) tablet 2.5 mg  2.5 mg Oral Daily Myles Rosenthal A, MD   2.5 mg at 12/20/22 0919   apixaban (ELIQUIS) tablet 2.5 mg  2.5 mg Oral BID Wendee Beavers T, MD   2.5 mg at 12/20/22 1447   atorvastatin (LIPITOR) tablet 40 mg  40 mg Oral Daily Myles Rosenthal A, MD   40 mg at 12/20/22 0919   cefTRIAXone (ROCEPHIN) 1 g in sodium chloride 0.9 % 100 mL IVPB  1 g Intravenous Q24H Mercy Riding, MD       Chlorhexidine Gluconate Cloth 2 % PADS 6 each  6 each Topical Q0600 Myles Rosenthal A, MD   6 each at 12/20/22 0600   dexamethasone (DECADRON) tablet 6 mg  6 mg Oral QHS Myles Rosenthal A, MD   6 mg at 12/20/22 0109   donepezil (ARICEPT) tablet 10 mg  10 mg Oral QHS Gonfa, Taye T, MD       DULoxetine (CYMBALTA) DR capsule 30 mg  30 mg Oral Daily Wendee Beavers T, MD   30 mg at 12/20/22 1447   furosemide (  LASIX) tablet 40 mg  40 mg Oral Daily Myles Rosenthal A, MD   40 mg at 12/20/22 0920   levothyroxine (SYNTHROID) tablet 100 mcg  100 mcg Oral Q0600 Myles Rosenthal A, MD   100 mcg at 12/20/22 0919   metoprolol tartrate (LOPRESSOR) tablet 75 mg  75 mg Oral BID Clance Boll, MD   75 mg at 12/20/22 0919   mirtazapine (REMERON) tablet 7.5 mg  7.5 mg Oral QHS Mercy Riding, MD       ondansetron (ZOFRAN) tablet 4 mg  4 mg Oral Q6H PRN Clance Boll, MD       Or   ondansetron [facility]) injection 4 mg  4 mg Intravenous Q6H PRN Clance Boll, MD       pantoprazole (PROTONIX) EC tablet 40 mg  40 mg Oral Daily Myles Rosenthal A, MD   40 mg at 12/20/22 0919   polyvinyl alcohol (LIQUIFILM TEARS) 1.4 % ophthalmic solution 1 drop  1 drop Both Eyes PRN Mercy Riding, MD        remdesivir 100 mg in sodium chloride 0.9 % 100 mL IVPB  100 mg Intravenous Daily Clance Boll, MD        VITAL SIGNS: BP 102/68 (BP Location: Right Arm)   Pulse 69   Temp 98.1 F (36.7 C) (Oral)   Resp 18   Ht 5\' 2"  (1.575 m)   Wt 135 lb 9.6 oz (61.5 kg)   SpO2 94%   BMI 24.80 kg/m  Filed Weights   12/19/22 1741 12/20/22 0211  Weight: 134 lb 11.2 oz (61.1 kg) 135 lb 9.6 oz (61.5 kg)    Estimated body mass index is 24.8 kg/m as calculated from the following:   Height as of this encounter: 5\' 2"  (1.575 m).   Weight as of this encounter: 135 lb 9.6 oz (61.5 kg).  LABS: CBC:    Component Value Date/Time   WBC 8.4 12/19/2022 1757   HGB 13.6 12/19/2022 1808   HCT 40.0 12/19/2022 1808   PLT 258 12/19/2022 1757   MCV 96.0 12/19/2022 1757   NEUTROABS 6.1 12/19/2022 1757   LYMPHSABS 0.9 12/19/2022 1757   MONOABS 0.8 12/19/2022 1757   EOSABS 0.4 12/19/2022 1757   BASOSABS 0.1 12/19/2022 1757   Comprehensive Metabolic Panel:    Component Value Date/Time   NA 138 12/20/2022 0231   NA 141 12/18/2022 1553   K 3.6 12/20/2022 0231   CL 99 12/20/2022 0231   CO2 23 12/20/2022 0231   BUN 40 (H) 12/20/2022 0231   BUN 48 (H) 12/18/2022 1553   CREATININE 1.51 (H) 12/20/2022 0231   GLUCOSE 134 (H) 12/20/2022 0231   CALCIUM 7.9 (L) 12/20/2022 0231   AST 27 12/20/2022 0231   ALT 24 12/20/2022 0231   ALKPHOS 60 12/20/2022 0231   BILITOT 0.4 12/20/2022 0231   PROT 6.0 (L) 12/20/2022 0231   ALBUMIN 2.6 (L) 12/20/2022 0231    RADIOGRAPHIC STUDIES: ECHOCARDIOGRAM COMPLETE  Result Date: 12/20/2022    ECHOCARDIOGRAM REPORT   Patient Name:   Erin Ryan Date of Exam: 12/20/2022 Medical Rec #:  [id number]     Height:       62.0 in Accession #:    [id number]    Weight:       135.6 lb Date of Birth:  1925-05-13      BSA:          1.621 m Patient Age:    89 years  BP:           137/48 mmHg Patient Gender: F             HR:           66 bpm. Exam Location:  Inpatient Procedure: 2D  Echo, Cardiac Doppler and Color Doppler Indications:    CHF-Acute Diastolic [id number]  History:        Patient has prior history of Echocardiogram examinations, most                 recent 08/11/2022. Arrythmias:Atrial Fibrillation and                 Tachycardia, Signs/Symptoms:Dyspnea; Risk Factors:Hypertension                 and Dyslipidemia. CKD, stage 3.  Sonographer:    Ronny Flurry Referring Phys: [id number] [place]  1. There is mild chordal SAM in the setting of hyperdynamic LV function, very mild dynamic LVOT peak gradient of 18 mmHg. Marland Kitchen Left ventricular ejection fraction, by estimation, is 70 to 75%. The left ventricle has hyperdynamic function. Left ventricular endocardial border not optimally defined to evaluate regional wall motion. There is mild left ventricular hypertrophy. Left ventricular diastolic parameters are consistent with Grade I diastolic dysfunction (impaired relaxation). Elevated left atrial pressure.  2. Right ventricular systolic function is normal. The right ventricular size is normal.  3. The mitral valve is abnormal. Mild mitral valve regurgitation. No evidence of mitral stenosis.  4. The tricuspid valve is abnormal.  5. The aortic valve is tricuspid. Aortic valve regurgitation is mild. No aortic stenosis is present. FINDINGS  Left Ventricle: There is mild chordal SAM in the setting of hyperdynamic LV function, very mild dynamic LVOT peak gradient of 18 mmHg. Left ventricular ejection fraction, by estimation, is 70 to 75%. The left ventricle has hyperdynamic function. Left ventricular endocardial border not optimally defined to evaluate regional wall motion. The left ventricular internal cavity size was normal in size. There is mild left ventricular hypertrophy. Left ventricular diastolic parameters are consistent with Grade I diastolic dysfunction (impaired relaxation). Elevated left atrial pressure. Right Ventricle: Indeterminant PASP, IVC poorly visualized,  cannot estimate RA pressure. The right ventricular size is normal. Right vetricular wall thickness was not well visualized. Right ventricular systolic function is normal. Left Atrium: Left atrial size was normal in size. Right Atrium: Right atrial size was normal in size. Pericardium: There is no evidence of pericardial effusion. Mitral Valve: The mitral valve is abnormal. Mild to moderate mitral annular calcification. Mild mitral valve regurgitation. No evidence of mitral valve stenosis. MV peak gradient, 6.5 mmHg. The mean mitral valve gradient is 3.0 mmHg. Tricuspid Valve: The tricuspid valve is abnormal. Tricuspid valve regurgitation is mild . No evidence of tricuspid stenosis. Aortic Valve: The aortic valve is tricuspid. Aortic valve regurgitation is mild. No aortic stenosis is present. Aortic valve mean gradient measures 4.0 mmHg. Aortic valve peak gradient measures 6.3 mmHg. Aortic valve area, by VTI measures 1.70 cm. Pulmonic Valve: The pulmonic valve was not well visualized. Pulmonic valve regurgitation is not visualized. No evidence of pulmonic stenosis. Aorta: The aortic root and ascending aorta are structurally normal, with no evidence of dilitation. Venous: The inferior vena cava was not well visualized. IAS/Shunts: No atrial level shunt detected by color flow Doppler.  LEFT VENTRICLE PLAX 2D LVIDd:         2.60 cm   Diastology LVIDs:  1.90 cm   LV e' medial:    3.68 cm/s LV PW:         1.10 cm   LV E/e' medial:  28.3 LV IVS:        1.20 cm   LV e' lateral:   7.62 cm/s LVOT diam:     1.90 cm   LV E/e' lateral: 13.6 LV SV:         48 LV SV Index:   30 LVOT Area:     2.84 cm  RIGHT VENTRICLE RV S prime:     10.70 cm/s TAPSE (M-mode): 1.3 cm LEFT ATRIUM             Index        RIGHT ATRIUM           Index LA diam:        2.90 cm 1.79 cm/m   RA Area:     11.20 cm LA Vol (A2C):   40.3 ml 24.87 ml/m  RA Volume:   22.90 ml  14.13 ml/m LA Vol (A4C):   41.2 ml 25.42 ml/m LA Biplane Vol: 44.0 ml  27.15 ml/m  AORTIC VALVE AV Area (Vmax):    1.79 cm AV Area (Vmean):   1.68 cm AV Area (VTI):     1.70 cm AV Vmax:           125.50 cm/s AV Vmean:          87.450 cm/s AV VTI:            0.283 m AV Peak Grad:      6.3 mmHg AV Mean Grad:      4.0 mmHg LVOT Vmax:         79.17 cm/s LVOT Vmean:        51.833 cm/s LVOT VTI:          0.170 m LVOT/AV VTI ratio: 0.60  AORTA Ao Root diam: 3.20 cm Ao Asc diam:  2.90 cm MITRAL VALVE                TRICUSPID VALVE MV Area (PHT): 3.08 cm     TR Peak grad:   28.3 mmHg MV Peak grad:  6.5 mmHg     TR Vmax:        266.00 cm/s MV Mean grad:  3.0 mmHg MV Vmax:       1.27 m/s     SHUNTS MV Vmean:      75.1 cm/s    Systemic VTI:  0.17 m MV Decel Time: 246 msec     Systemic Diam: 1.90 cm MV E velocity: 104.00 cm/s MV A velocity: 114.00 cm/s MV E/A ratio:  0.91 Carlyle Dolly MD Electronically signed by Carlyle Dolly MD Signature Date/Time: 12/20/2022/1:35:52 PM    Final    DG Chest Port 1 View  Result Date: 12/19/2022 CLINICAL DATA:  Short of breath EXAM: PORTABLE CHEST 1 VIEW COMPARISON:  08/13/2022 FINDINGS: Possible airspace disease at the left base. Right lung grossly clear. Stable cardiomediastinal silhouette with aortic atherosclerosis. No pneumothorax IMPRESSION: Possible airspace disease and effusion at the left base. Electronically Signed   By: Donavan Foil M.D.   On: 12/19/2022 18:30    PERFORMANCE STATUS (ECOG) : 3 - Symptomatic, >50% confined to bed  Review of Systems Unable to complete  Physical Exam General: Frail appearing Pulmonary: Unlabored Extremities: no edema, no joint deformities Skin: no rashes Neurological: Lethargic, confused  IMPRESSION: Patient confused at baseline, lethargic but  wakes with stimulation.  Patient unable to meaningfully engage in conversation regarding goals.  I met with patient's daughter.  Daughter reports that patient has been declining over the past several months with progressive weakness and relying on  family/caregivers for more help with ADLs.  Patient lives at home with daughter but also has a hired caregiver for 16 hours a week.  Daughter states that she is in agreement with current scope of treatment.  However, she does not anticipate the patient will return to her previous functional baseline following this acute illness.  Daughter fears that this will herald a further decline until end-of-life.  Daughter asked about getting hospice involved at home and we discussed that extensively.  Daughter would be interested in speaking with the hospice liaison to try to coordinate care at home.  Daughter confirms DNR/DNI.  PLAN: -Continue current scope of treatment -DNR/DNI -Daughter would like to speak with the hospice liaison to discuss hospice services at home  Time Total: 60 minutes  Visit consisted of counseling and education dealing with the complex and emotionally intense issues of symptom management and palliative care in the setting of serious and potentially life-threatening illness.Greater than 50%  of this time was spent counseling and coordinating care related to the above assessment and plan.  Signed by: Altha Harm, PhD, NP-C

## 2022-12-20 NOTE — Progress Notes (Signed)
[facility]) Hospital Liaison RN note  Received request from TOC/PMT for hospice services at home after discharge. Chart and patient information under review by Hospice physician.   Spoke with daughter Juliann Pulse to initiate education related to hospice philosophy, services, and team approach to care. Patient/family verbalized understanding of information given.   Per discussion, the plan for discharge is unclear at this time. Daughter would like liaison to follow back up in a day or so to confirm she is interested in services. DME needs will be addressed at that time.  Please call with any hospice related questions or concerns.  Thank you for the opportunity to participate in this patient's care.  Jhonnie Garner, Therapist, sports, BSN [employer] [phone]

## 2022-12-20 NOTE — TOC Progression Note (Signed)
Transition of Care [facility]) - Progression Note    Patient Details  Name: Erin Ryan MRN: [id number] Date of Birth: [birth year]  [facility]) CM/SW Contact  Tom-Feuerborn, Renea Ee, RN Phone Number: 12/20/2022, 4:43 PM  Clinical Narrative:     Patient's daughter requesting to speak  with the hospice liaison about possibly arranging hospice services at home. Daughter chooses [facility]. CM called in referral to [facility] to reach out to daughter. CM will continue to follow as patient transitions home with Hospice.       Expected Discharge Plan and Services                                               Social Determinants of Health (SDOH) Interventions [place]: Patient Declined (08/15/2022)  Housing: Low Risk  (08/15/2022)  Transportation Needs: Patient Declined (08/15/2022)  Utilities: Patient Declined (08/15/2022)  Tobacco Use: Low Risk  (12/11/2022)    Readmission Risk Interventions     No data to display

## 2022-12-20 NOTE — Progress Notes (Signed)
Echocardiogram 2D Echocardiogram has been performed.  Erin Ryan 12/20/2022, 1:24 PM

## 2022-12-20 NOTE — Progress Notes (Signed)
PROGRESS NOTE  Erin Ryan [id number] DOB: 09-07-1925   PCP: Harlan Stains, MD  Patient is from: Home.  Lives with daughter.  Dependent for some ADLS  DOA: [id number] LOS: 1  Chief complaints Chief Complaint  Patient presents with   Shortness of Breath     Brief Narrative / Interim history: 89 year old F with PMH of dementia, diastolic CHF, PE/A-fib on Eliquis, CKD-3B, HTN, hypothyroidism, anxiety and GERD presenting with SOB, DOE, weakness, BLE edema and confusion for 2 to 3 days, and admitted for COVID-19 infection.  Vital stable except for mild temp to 100.1.  CXR raises concern for possible LLL airspace disease and effusion at the left base.  COVID-19 PCR positive.  BNP 321 (523 previously).  Patient was started on Decadron, remdesivir, ceftriaxone, Zithromax and Lasix and admitted.    Subjective: Seen and examined earlier this morning.  It seems like she had urinary tension overnight and Foley catheter placed.  Patient is sleepy but wakes to voice.  She is oriented x 4 except date but falls back asleep quickly.  Has no complaints.  Wants to go home.  Patient's daughter at bedside.  She claims to be HCPOA.  She says patient is DNR.  Also concern about difficulty urinating before she came to the hospital.   Objective: Vitals:   12/20/22 0211 12/20/22 0618 12/20/22 0912 12/20/22 1145  BP: (!) 127/56 (!) 137/48 (!) 127/47 102/68  Pulse: 70 70 68 69  Resp: 18 18 19 18   Temp: 98.1 F (36.7 C) 97.7 F (36.5 C) 97.8 F (36.6 C) 98.1 F (36.7 C)  TempSrc: Oral Oral Oral Oral  SpO2: 93% 95% 94% 94%  Weight: 61.5 kg     Height: 5\' 2"  (1.575 m)       Examination:  GENERAL: No apparent distress.  Nontoxic. HEENT: MMM.  Vision and hearing grossly intact.  NECK: Supple.  No apparent JVD.  RESP:  No IWOB.  Fair aeration bilaterally but limited exam. CVS:  RRR. Heart sounds normal.  ABD/GI/GU: BS+. Abd soft, NTND.  MSK/EXT:  Moves extremities. No apparent deformity.  1+ pedal  edema bilaterally. SKIN: no apparent skin lesion or wound NEURO: Sleepy but wakes to voice.  Oriented x 4 except date.  Follows commands.  No apparent focal neuro deficit. PSYCH: Calm. Normal affect.   Procedures:  None  Microbiology summarized: COVID-19 PCR positive  Assessment and plan: Principal Problem:   Pneumonia due to COVID-19 virus Active Problems:   Essential hypertension   GERD   Chronic kidney disease, stage 3b (HCC)   Adult hypothyroidism   Moderate dementia without behavioral disturbance, psychotic disturbance, mood disturbance, or anxiety ([place])   History of pulmonary embolism   Paroxysmal atrial fibrillation (HCC)   Generalized weakness   Acute on chronic diastolic CHF (congestive heart failure) ([employer])   Goals of care, counseling/discussion   DNR (do not resuscitate)  Pneumonia due to COVID-19 virus: No significant respiratory distress.  CXR with possible LLL airspace opacity and a small left base pleural effusion.  No documented desaturation.  VBG reassuring. -Continue Decadron and remdesivir -DVT prophylaxis -OOB/PT/OT   Acute on chronic diastolic CHF: Reportedly had increased LE edema, SOB and DOE.  Exam with BLE edema.  CXR as above.  BNP elevated but lower than prior value.  TTE in 07/2022 with LVEF of 55 to 60%, G2 DD, mildly elevated PASP.  On p.o. Lasix 20 mg daily at home.  Received IV Lasix yesterday.  I&O incomplete. -Continue p.o. Lasix  40 mg daily -Follow echocardiogram -Strict intake and output, daily weight, renal functions and electrolytes  CKD-3B: Stable. Recent Labs    08/13/22 0318 08/14/22 0319 08/15/22 0307 08/16/22 0551 08/17/22 0524 08/18/22 0418 08/19/22 0449 12/18/22 1553 12/19/22 1757 12/20/22 0231  BUN 46* 42* 46* 46* 47* 42* 39* 48* 46* 40*  CREATININE 1.15* 1.12* 1.29* 1.21* 1.22* 1.25* 1.30* 1.51* 1.57* 1.51*  -Continue monitoring  Dementia without behavioral disturbance: Oriented x 4 except date. -Reorientation and  delirium precaution  Acute urinary tension/frequent urination: Had Foley catheter inserted overnight.  Daughter thinks about 900 cc urine came out.  I could not find this in the chart. -Check urinalysis -Continue Foley for now  Paroxysmal atrial fibrillation: Currently in sinus rhythm. -continue  on metoprolol  -continue Eliquis   Goal of care counseling: Discussed CODE STATUS with patient's daughter who is HCPOA.  Daughter states that patient is DNR.  Given her age and comorbidity, I offered palliative care consult for further goals of care discussion.  Daughter up with her. -Changed CODE STATUS to DNR/DNI -Palliative care consulted   Hypertension: Normotensive -continue amlodipine     HLD -continue atorvastatin     Hypothyroidism: TSH within normal. -continue synthroid     Anxiety -continue remeron at reduced dose.   GERD, -ppi    Hx of PE - on Elilquis   Generalized weakness -PT/OT     Body mass index is 24.8 kg/m.         DVT prophylaxis:  apixaban (ELIQUIS) tablet 2.5 mg Start: 12/20/22 1330Patient is on Eliquis. apixaban (ELIQUIS) tablet 2.5 mg  Code Status: DNR/DNI Family Communication: Updated patient's daughter at bedside Level of care: Telemetry Cardiac Status is: Inpatient Remains inpatient appropriate because: COVID-19 pneumonia, acute CHF and generalized weakness   Final disposition: TBD Consultants:  Palliative medicine  55 minutes with more than 50% spent in reviewing records, counseling patient/family and coordinating care.   Sch Meds:  Scheduled Meds:  amLODipine  2.5 mg Oral Daily   apixaban  2.5 mg Oral BID   atorvastatin  40 mg Oral Daily   Chlorhexidine Gluconate Cloth  6 each Topical Q0600   dexamethasone  6 mg Oral QHS   donepezil  10 mg Oral QHS   DULoxetine  30 mg Oral Daily   furosemide  40 mg Oral Daily   levothyroxine  100 mcg Oral Q0600   metoprolol tartrate  75 mg Oral BID   mirtazapine  7.5 mg Oral QHS    pantoprazole  40 mg Oral Daily   Continuous Infusions:  remdesivir 100 mg in sodium chloride 0.9 % 100 mL IVPB     PRN Meds:.acetaminophen **OR** acetaminophen, albuterol, ondansetron **OR** ondansetron (ZOFRAN) IV, polyvinyl alcohol  Antimicrobials: Anti-infectives (From admission, onward)    Start     Dose/Rate Route Frequency Ordered Stop   12/20/22 1400  remdesivir 100 mg in sodium chloride 0.9 % 100 mL IVPB       See Hyperspace for full Linked Orders Report.   100 mg 200 mL/hr over 30 Minutes Intravenous Daily 12/19/22 2341 12/22/22 0959   12/19/22 2345  remdesivir 200 mg in sodium chloride 0.9% 250 mL IVPB       See Hyperspace for full Linked Orders Report.   200 mg 580 mL/hr over 30 Minutes Intravenous Once 12/19/22 2341 12/20/22 0149   12/19/22 1915  cefTRIAXone (ROCEPHIN) 1 g in sodium chloride 0.9 % 100 mL IVPB        1 g 200  mL/hr over 30 Minutes Intravenous  Once 12/19/22 1907 12/19/22 2049   12/19/22 1915  azithromycin (ZITHROMAX) 500 mg in sodium chloride 0.9 % 250 mL IVPB        500 mg 250 mL/hr over 60 Minutes Intravenous  Once 12/19/22 1907 12/19/22 2228        I have personally reviewed the following labs and images: CBC: Recent Labs  Lab 12/19/22 1757 12/19/22 1808  WBC 8.4  --   NEUTROABS 6.1  --   HGB 13.3 13.6  HCT 41.1 40.0  MCV 96.0  --   PLT 258  --    BMP &GFR Recent Labs  Lab 12/18/22 1553 12/19/22 1757 12/19/22 1808 12/20/22 0231  NA 141 137 136 138  K 4.8 4.2 4.1 3.6  CL 102 100  --  99  CO2 20 22  --  23  GLUCOSE 109* 129*  --  134*  BUN 48* 46*  --  40*  CREATININE 1.51* 1.57*  --  1.51*  CALCIUM 8.9 8.8*  --  7.9*   Estimated Creatinine Clearance: 19.2 mL/min (A) (by C-G formula based on SCr of 1.51 mg/dL (H)). Liver & Pancreas: Recent Labs  Lab 12/19/22 1757 12/20/22 0231  AST 37 27  ALT 24 24  ALKPHOS 70 60  BILITOT 0.4 0.4  PROT 7.1 6.0*  ALBUMIN 3.1* 2.6*   No results for input(s): "LIPASE", "AMYLASE" in the  last 168 hours. No results for input(s): "AMMONIA" in the last 168 hours. Diabetic: No results for input(s): "HGBA1C" in the last 72 hours. No results for input(s): "GLUCAP" in the last 168 hours. Cardiac Enzymes: No results for input(s): "CKTOTAL", "CKMB", "CKMBINDEX", "TROPONINI" in the last 168 hours. No results for input(s): "PROBNP" in the last 8760 hours. Coagulation Profile: No results for input(s): "INR", "PROTIME" in the last 168 hours. Thyroid Function Tests: Recent Labs    12/20/22 0231  TSH 0.696   Lipid Profile: No results for input(s): "CHOL", "HDL", "LDLCALC", "TRIG", "CHOLHDL", "LDLDIRECT" in the last 72 hours. Anemia Panel: No results for input(s): "VITAMINB12", "FOLATE", "FERRITIN", "TIBC", "IRON", "RETICCTPCT" in the last 72 hours. Urine analysis:    Component Value Date/Time   COLORURINE YELLOW 12/20/2022 0958   APPEARANCEUR HAZY (A) 12/20/2022 0958   LABSPEC 1.014 12/20/2022 0958   PHURINE 5.0 12/20/2022 0958   GLUCOSEU NEGATIVE 12/20/2022 0958   HGBUR NEGATIVE 12/20/2022 0958   BILIRUBINUR NEGATIVE 12/20/2022 0958   KETONESUR NEGATIVE 12/20/2022 0958   PROTEINUR NEGATIVE 12/20/2022 0958   NITRITE NEGATIVE 12/20/2022 0958   LEUKOCYTESUR LARGE (A) 12/20/2022 0958   Sepsis Labs: Invalid input(s): "PROCALCITONIN", "LACTICIDVEN"  Microbiology: Recent Results (from the past 240 hour(s))  SARS Coronavirus 2 by RT PCR (hospital order, performed in [facility] hospital lab) *cepheid single result test* Anterior Nasal Swab     Status: Abnormal   Collection Time: 12/19/22  5:52 PM   Specimen: Anterior Nasal Swab  Result Value Ref Range Status   SARS Coronavirus 2 by RT PCR POSITIVE (A) NEGATIVE Final    Comment: Performed at [facility], [place] [address], [place], [place] [address]    Radiology Studies: DG Chest Port 1 View  Result Date: 12/19/2022 CLINICAL DATA:  Short of breath EXAM: PORTABLE CHEST 1 VIEW COMPARISON:  08/13/2022 FINDINGS: Possible  airspace disease at the left base. Right lung grossly clear. Stable cardiomediastinal silhouette with aortic atherosclerosis. No pneumothorax IMPRESSION: Possible airspace disease and effusion at the left base. Electronically Signed  By: Donavan Foil M.D.   On: 12/19/2022 18:30      Nikky Duba T. [place]  If 7PM-7AM, please contact night-coverage [email or web address] 12/20/2022, 12:33 PM

## 2022-12-21 DIAGNOSIS — U071 COVID-19: Secondary | ICD-10-CM | POA: Diagnosis not present

## 2022-12-21 DIAGNOSIS — N1832 Chronic kidney disease, stage 3b: Secondary | ICD-10-CM | POA: Diagnosis not present

## 2022-12-21 DIAGNOSIS — Z86711 Personal history of pulmonary embolism: Secondary | ICD-10-CM | POA: Diagnosis not present

## 2022-12-21 DIAGNOSIS — Z7189 Other specified counseling: Secondary | ICD-10-CM | POA: Diagnosis not present

## 2022-12-21 LAB — RENAL FUNCTION PANEL
Albumin: 2.2 g/dL — ABNORMAL LOW (ref 3.5–5.0)
Anion gap: 12 (ref 5–15)
BUN: 59 mg/dL — ABNORMAL HIGH (ref 8–23)
CO2: 24 mmol/L (ref 22–32)
Calcium: 8.1 mg/dL — ABNORMAL LOW (ref 8.9–10.3)
Chloride: 103 mmol/L (ref 98–111)
Creatinine, Ser: 1.61 mg/dL — ABNORMAL HIGH (ref 0.44–1.00)
GFR, Estimated: 29 mL/min — ABNORMAL LOW (ref >60)
Glucose, Bld: 150 mg/dL — ABNORMAL HIGH (ref 70–99)
Phosphorus: 3.5 mg/dL (ref 2.5–4.6)
Potassium: 3.9 mmol/L (ref 3.5–5.1)
Sodium: 139 mmol/L (ref 135–145)

## 2022-12-21 LAB — MAGNESIUM: Magnesium: 1.8 mg/dL (ref 1.7–2.4)

## 2022-12-21 NOTE — Evaluation (Addendum)
Physical Therapy Evaluation Patient Details Name: Erin Ryan MRN: [id number] DOB: 1925-01-14 Today's Date: 12/21/2022  History of Present Illness  89 y.o. female admitted with SOB/DOE, weakness, confusion x 2 -3 days. Pt found to be Covid +. PMH -  Afib, hypertension, hypothyroidism, Anxiety , arthritis, GERD, hx of PE, CKDIIIa  Clinical Impression  Pt admitted with above diagnosis and presents to PT with functional limitations due to deficits listed below (See PT problem list). Pt needs skilled PT to maximize independence and safety. Pt could benefit from post acute rehab <3 hrs/day since she is below her baseline and needs to be more independent prior to return home.         Recommendations for follow up therapy are one component of a multi-disciplinary discharge planning process, led by the attending physician.  Recommendations may be updated based on patient status, additional functional criteria and insurance authorization.  Follow Up Recommendations Can patient physically be transported by private vehicle: Yes     Assistance Recommended at Discharge Frequent or constant Supervision/Assistance  Patient can return home with the following  A little help with walking and/or transfers;A little help with bathing/dressing/bathroom;Assistance with cooking/housework;Assist for transportation;Help with stairs or ramp for entrance    Equipment Recommendations None recommended by PT  Recommendations for Other Services       Functional Status Assessment Patient has had a recent decline in their functional status and demonstrates the ability to make significant improvements in function in a reasonable and predictable amount of time.     Precautions / Restrictions Precautions Precautions: Fall Precaution Comments: Covid+      Mobility  Bed Mobility               General bed mobility comments: Pt up in chair    Transfers Overall transfer level: Needs assistance Equipment  used: Rollator (4 wheels) Transfers: Sit to/from Stand, Bed to chair/wheelchair/BSC Sit to Stand: Min assist   Step pivot transfers: Min assist       General transfer comment: Min assist to power up and for balance. Chair to bsc to chair. with min assist for balance and support    Ambulation/Gait Ambulation/Gait assistance: Min assist Gait Distance (Feet): 75 Feet Assistive device: Rollator (4 wheels) Gait Pattern/deviations: Step-through pattern, Decreased stance time - right, Knee flexed in stance - left (genu valgus lt knee)   Gait velocity interpretation: 1.31 - 2.62 ft/sec, indicative of limited community ambulator   General Gait Details: Assist for balance and support. Verbal cues to stand more upright.  Stairs            Wheelchair Mobility    Modified Rankin (Stroke Patients Only)       Balance Overall balance assessment: Needs assistance Sitting-balance support: Feet supported, No upper extremity supported Sitting balance-Leahy Scale: Good     Standing balance support: Single extremity supported, Bilateral upper extremity supported, Reliant on assistive device for balance, During functional activity Standing balance-Leahy Scale: Poor Standing balance comment: UE support and min guard for static standing                             Pertinent Vitals/Pain Pain Assessment Pain Assessment: No/denies pain    Home Living Family/patient expects to be discharged to:: Private residence Living Arrangements: Children Available Help at Discharge: Family;Available 24 hours/day Type of Home: House Home Access: Stairs to enter   [employer] of Steps: 1   Home Layout: One level  Home Equipment: Rollator (4 wheels);Cane - single point;Shower seat      Prior Function Prior Level of Function : Needs assist       Physical Assist : ADLs (physical)   ADLs (physical): Bathing;Dressing;IADLs Mobility Comments: Uses rollator ADLs Comments:  PCA 3x/wk for 16 hours to assist with bathing/showers.     Hand Dominance   Dominant Hand: Right    Extremity/Trunk Assessment   Upper Extremity Assessment Upper Extremity Assessment: Defer to OT evaluation    Lower Extremity Assessment Lower Extremity Assessment: Generalized weakness;LLE deficits/detail LLE Deficits / Details: severe knee valgus    Cervical / Trunk Assessment Cervical / Trunk Assessment: Kyphotic  Communication   Communication: HOH  Cognition Arousal/Alertness: Awake/alert Behavior During Therapy: Impulsive Overall Cognitive Status: History of cognitive impairments - at baseline                                          General Comments General comments (skin integrity, edema, etc.): VSS on RA    Exercises     Assessment/Plan    PT Assessment Patient needs continued PT services  PT Problem List Decreased strength;Decreased activity tolerance;Decreased balance;Decreased mobility;Decreased safety awareness       PT Treatment Interventions DME instruction;Gait training;Functional mobility training;Therapeutic activities;Balance training;Therapeutic exercise;Patient/family education    PT Goals (Current goals can be found in the Care Plan section)  Acute Rehab PT Goals Patient Stated Goal: go home PT Goal Formulation: With patient/family Time For Goal Achievement: 01/04/23 Potential to Achieve Goals: Good    Frequency Min 1X/week     Co-evaluation               AM-PAC PT "6 Clicks" Mobility  Outcome Measure Help needed turning from your back to your side while in a flat bed without using bedrails?: A Little Help needed moving from lying on your back to sitting on the side of a flat bed without using bedrails?: A Little Help needed moving to and from a bed to a chair (including a wheelchair)?: A Little Help needed standing up from a chair using your arms (e.g., wheelchair or bedside chair)?: A Little Help needed to walk  in hospital room?: A Little Help needed climbing 3-5 steps with a railing? : A Lot 6 Click Score: 17    End of Session   Activity Tolerance: Patient tolerated treatment well Patient left: in chair;with call bell/phone within reach;with chair alarm set;with family/visitor present Nurse Communication: Mobility status PT Visit Diagnosis: Unsteadiness on feet (R26.81);Other abnormalities of gait and mobility (R26.89);Muscle weakness (generalized) (M62.81)    Time: [id number] PT Time Calculation (min) (ACUTE ONLY): 26 min   Charges:   PT Evaluation $PT Eval Moderate Complexity: 1 Mod PT Treatments $Gait Training: 8-22 mins        [place] [facility] Doctor [place] 12/21/2022, 3:50 PM

## 2022-12-21 NOTE — Progress Notes (Signed)
PROGRESS NOTE  Erin Ryan [id number] DOB: [birth year]   PCP: Harlan Stains, MD  Patient is from: Home.  Lives with daughter.  Dependent for some ADLS  DOA: [id number] LOS: 2  Chief complaints Chief Complaint  Patient presents with   Shortness of Breath     Brief Narrative / Interim history: 89 year old F with PMH of dementia, diastolic CHF, PE/A-fib on Eliquis, CKD-3B, HTN, hypothyroidism, anxiety and GERD presenting with SOB, DOE, weakness, BLE edema and confusion for 2 to 3 days, and admitted for COVID-19 infection.  Vital stable except for mild temp to 100.1.  CXR raises concern for possible LLL airspace disease and effusion at the left base.  COVID-19 PCR positive.  BNP 321 (523 previously).  Patient was started on Decadron, remdesivir, ceftriaxone, Zithromax and Lasix and admitted.  Palliative medicine consulted.  Hospice referral placed for home hospice.   Subjective: Seen and examined earlier this morning.  No major events overnight of this morning.  Patient is sleepy but wakes to voice.  Fairly oriented although confused about where she is at.  Daughter at bedside.  Patient denies pain, shortness of breath, nausea or vomiting.  Objective: Vitals:   12/20/22 1614 12/20/22 2039 12/21/22 0638 12/21/22 0933  BP: (!) 117/55 (!) 136/57 (!) 141/56 (!) 142/59  Pulse: 69 80 70 73  Resp: 18 18 20    Temp: 98.6 F (37 C) 97.8 F (36.6 C) 97.8 F (36.6 C)   TempSrc: Oral Oral Oral   SpO2: 97% 91% 95%   Weight:   61.2 kg   Height:        Examination:  GENERAL: No apparent distress.  Nontoxic. HEENT: MMM.  Vision and hearing grossly intact.  NECK: Supple.  No apparent JVD.  RESP:  No IWOB.  Fair aeration bilaterally. CVS:  RRR. Heart sounds normal.  ABD/GI/GU: BS+. Abd soft, NTND.  MSK/EXT:   No apparent deformity. Moves extremities.  1+ pedal edema. SKIN: no apparent skin lesion or wound NEURO: Sleepy but wakes to voice.  Oriented to self and family.  Confused about  place.  No apparent focal neuro deficit. PSYCH: Calm. Normal affect.   Procedures:  None  Microbiology summarized: COVID-19 PCR positive  Assessment and plan: Principal Problem:   Pneumonia due to COVID-19 virus Active Problems:   Essential hypertension   GERD   Chronic kidney disease, stage 3b (HCC)   Adult hypothyroidism   Moderate dementia without behavioral disturbance, psychotic disturbance, mood disturbance, or anxiety ([place])   History of pulmonary embolism   Paroxysmal atrial fibrillation (HCC)   Generalized weakness   Acute on chronic diastolic CHF (congestive heart failure) ([place])   Goals of care, counseling/discussion   DNR (do not resuscitate)   Palliative care encounter  Pneumonia due to COVID-19 virus: No significant respiratory distress.  CXR with possible LLL airspace opacity and a small left base pleural effusion.  No documented desaturation.  VBG reassuring. -Continue Decadron and remdesivir -DVT prophylaxis -OOB/PT/OT   Acute on chronic diastolic CHF: Reportedly had increased LE edema, SOB and DOE.  Exam with BLE edema.  CXR as above.  BNP elevated but lower than prior value.  TTE with LVEF of 70 to 75%, G1-DD.  On p.o. Lasix 20 mg daily at home.  Lasix increased to 40.  Creatinine slightly up. -Hold diuretics today -Strict intake and output, daily weight, renal functions and electrolytes  CKD-3B: Creatinine slightly up. Recent Labs    08/14/22 0319 08/15/22 [id number] 08/16/22 0551 08/17/22 0524 08/18/22 [id number]  08/19/22 0449 12/18/22 1553 12/19/22 1757 12/20/22 0231 12/21/22 0217  BUN 42* 46* 46* 47* 42* 39* 48* 46* 40* 59*  CREATININE 1.12* 1.29* 1.21* 1.22* 1.25* 1.30* 1.51* 1.57* 1.51* 1.61*  -Disc kidney diuretics -Recheck in the morning  Dementia without behavioral disturbance: Fairly oriented for most part. -Reorientation and delirium precaution  Acute urinary tension/frequent urination: Had Foley catheter inserted the night of admission.  Per  daughter, about 900 cc urine output after Foley insertion.  UA with large LE and rare bacteria. -Continue Foley catheter -Voiding trial prior to discharge -Continue IV ceftriaxone pending urine culture  Paroxysmal atrial fibrillation: Currently in sinus rhythm. -continue  on metoprolol  -continue Eliquis   Goal of care counseling: DNR/DNI. -Appreciate help by palliative medicine-hospice referral made   Hypertension: Normotensive -continue amlodipine    Hyperlipidemia -continue atorvastatin     Hypothyroidism: TSH within normal. -continue synthroid     Anxiety -continue remeron at reduced dose.   GERD, -ppi    History of pulmonary embolism -on Elilquis   Generalized weakness -PT/OT     Body mass index is 24.69 kg/m.         DVT prophylaxis:  apixaban (ELIQUIS) tablet 2.5 mg Start: 12/20/22 1330Patient is on Eliquis. apixaban (ELIQUIS) tablet 2.5 mg  Code Status: DNR/DNI Family Communication: Updated patient's daughter at bedside Level of care: Med-Surg Status is: Inpatient Remains inpatient appropriate because: COVID-19 pneumonia, acute CHF, generalized weakness and possible UTI   Final disposition: Home with home hospice. Consultants:  Palliative medicine  35 minutes with more than 50% spent in reviewing records, counseling patient/family and coordinating care.   Sch Meds:  Scheduled Meds:  amLODipine  2.5 mg Oral Daily   apixaban  2.5 mg Oral BID   atorvastatin  40 mg Oral Daily   Chlorhexidine Gluconate Cloth  6 each Topical Q0600   dexamethasone  6 mg Oral QHS   donepezil  10 mg Oral QHS   DULoxetine  30 mg Oral Daily   levothyroxine  100 mcg Oral Q0600   metoprolol tartrate  75 mg Oral BID   mirtazapine  7.5 mg Oral QHS   pantoprazole  40 mg Oral Daily   Continuous Infusions:  cefTRIAXone (ROCEPHIN)  IV 1 g (12/20/22 1706)   PRN Meds:.acetaminophen **OR** acetaminophen, albuterol, ondansetron **OR** ondansetron (ZOFRAN) IV, polyvinyl  alcohol  Antimicrobials: Anti-infectives (From admission, onward)    Start     Dose/Rate Route Frequency Ordered Stop   12/20/22 1615  cefTRIAXone (ROCEPHIN) 1 g in sodium chloride 0.9 % 100 mL IVPB        1 g 200 mL/hr over 30 Minutes Intravenous Every 24 hours 12/20/22 1516 12/23/22 1614   12/20/22 1400  remdesivir 100 mg in sodium chloride 0.9 % 100 mL IVPB       See Hyperspace for full Linked Orders Report.   100 mg 200 mL/hr over 30 Minutes Intravenous Daily 12/19/22 2341 12/21/22 1008   12/19/22 2345  remdesivir 200 mg in sodium chloride 0.9% 250 mL IVPB       See Hyperspace for full Linked Orders Report.   200 mg 580 mL/hr over 30 Minutes Intravenous Once 12/19/22 2341 12/20/22 0149   12/19/22 1915  cefTRIAXone (ROCEPHIN) 1 g in sodium chloride 0.9 % 100 mL IVPB        1 g 200 mL/hr over 30 Minutes Intravenous  Once 12/19/22 1907 12/19/22 2049   12/19/22 1915  azithromycin (ZITHROMAX) 500 mg in sodium chloride 0.9 %  250 mL IVPB        500 mg 250 mL/hr over 60 Minutes Intravenous  Once 12/19/22 1907 12/19/22 2228        I have personally reviewed the following labs and images: CBC: Recent Labs  Lab 12/19/22 1757 12/19/22 1808  WBC 8.4  --   NEUTROABS 6.1  --   HGB 13.3 13.6  HCT 41.1 40.0  MCV 96.0  --   PLT 258  --    BMP &GFR Recent Labs  Lab 12/18/22 1553 12/19/22 1757 12/19/22 1808 12/20/22 0231 12/21/22 0217  NA 141 137 136 138 139  K 4.8 4.2 4.1 3.6 3.9  CL 102 100  --  99 103  CO2 20 22  --  23 24  GLUCOSE 109* 129*  --  134* 150*  BUN 48* 46*  --  40* 59*  CREATININE 1.51* 1.57*  --  1.51* 1.61*  CALCIUM 8.9 8.8*  --  7.9* 8.1*  MG  --   --   --   --  1.8  PHOS  --   --   --   --  3.5   Estimated Creatinine Clearance: 18 mL/min (A) (by C-G formula based on SCr of 1.61 mg/dL (H)). Liver & Pancreas: Recent Labs  Lab 12/19/22 1757 12/20/22 0231 12/21/22 0217  AST 37 27  --   ALT 24 24  --   ALKPHOS 70 60  --   BILITOT 0.4 0.4  --    PROT 7.1 6.0*  --   ALBUMIN 3.1* 2.6* 2.2*   No results for input(s): "LIPASE", "AMYLASE" in the last 168 hours. No results for input(s): "AMMONIA" in the last 168 hours. Diabetic: No results for input(s): "HGBA1C" in the last 72 hours. No results for input(s): "GLUCAP" in the last 168 hours. Cardiac Enzymes: No results for input(s): "CKTOTAL", "CKMB", "CKMBINDEX", "TROPONINI" in the last 168 hours. No results for input(s): "PROBNP" in the last 8760 hours. Coagulation Profile: No results for input(s): "INR", "PROTIME" in the last 168 hours. Thyroid Function Tests: Recent Labs    12/20/22 0231  TSH 0.696   Lipid Profile: No results for input(s): "CHOL", "HDL", "LDLCALC", "TRIG", "CHOLHDL", "LDLDIRECT" in the last 72 hours. Anemia Panel: No results for input(s): "VITAMINB12", "FOLATE", "FERRITIN", "TIBC", "IRON", "RETICCTPCT" in the last 72 hours. Urine analysis:    Component Value Date/Time   COLORURINE YELLOW 12/20/2022 0958   APPEARANCEUR HAZY (A) 12/20/2022 0958   LABSPEC 1.014 12/20/2022 0958   PHURINE 5.0 12/20/2022 0958   GLUCOSEU NEGATIVE 12/20/2022 0958   HGBUR NEGATIVE 12/20/2022 0958   BILIRUBINUR NEGATIVE 12/20/2022 0958   KETONESUR NEGATIVE 12/20/2022 0958   PROTEINUR NEGATIVE 12/20/2022 0958   NITRITE NEGATIVE 12/20/2022 0958   LEUKOCYTESUR LARGE (A) 12/20/2022 0958   Sepsis Labs: Invalid input(s): "PROCALCITONIN", "LACTICIDVEN"  Microbiology: Recent Results (from the past 240 hour(s))  SARS Coronavirus 2 by RT PCR (hospital order, performed in [facility] hospital lab) *cepheid single result test* Anterior Nasal Swab     Status: Abnormal   Collection Time: 12/19/22  5:52 PM   Specimen: Anterior Nasal Swab  Result Value Ref Range Status   SARS Coronavirus 2 by RT PCR POSITIVE (A) NEGATIVE Final    Comment: Performed at [facility], [employer] [address], [place], [place] [address]    Radiology Studies: No results found.    Jeison Delpilar T. [place]  If 7PM-7AM, please contact night-coverage [email or web address] 12/21/2022, 1:39 PM

## 2022-12-21 NOTE — Progress Notes (Addendum)
[facility]) Hospital Liaison Note  Met with daughter [name] and granddaughter Jerene Pitch outside of the room to discuss hospice services.  [name] has many concerns around ability to care for her mother and whether she would like to pursue long term care/rehab or home with hospice. Answered many questions and requested TOC Jacqualin Combes to connect with family to answer questions regarding placement and possible Medicaid application.  [place] liaison will follow up tomorrow to continue to offer support to this family.  [place] Addendum: Librarian, academic from [place]. At this time family wishes to pursue rehab and then LTC. Referral received for outpatient palliative to follow at chosen facility.  Please call with any hospice or outpatient palliative related questions or concerns.  Thank you, Margaretmary Eddy, BSN, RN [facility] Liaison  [phone]

## 2022-12-21 NOTE — Evaluation (Addendum)
Occupational Therapy Evaluation Patient Details Name: Erin Ryan MRN: [id number] DOB: 1925/05/25 Today's Date: 12/21/2022   History of Present Illness 89 y.o. female with medical history significant of Afib, hypertension, HLD, hypothyroidism, Anxiety , arthritis, GERD, hx of PE, CKDIIIa who presents to ED BIB EMS with SOB/DOE , weakness, confusion x 2 -3 days.   Clinical Impression   Patient admitted for the diagnosis above.  PTA she lives with her daughter, and has a PCA for 16 hours/week to assist with ADL.  Patient, per the daughter, is using RW more in the home, and is needing assist as needed for ADL, iADL, medications and community mobility.  Daughter would like to take her mother home, but is unsure she is able to provide the needed assist.  Therefore OT recommends a lower intensity post acute rehab, up to 3 hours/day, to maximize her functional status.  OT will follow in the acute setting to maximize her functional status and assist with eventual return home.  No post acute OT is anticipated given PCA and family assist.        Recommendations for follow up therapy are one component of a multi-disciplinary discharge planning process, led by the attending physician.  Recommendations may be updated based on patient status, additional functional criteria and insurance authorization.   Assistance Recommended at Discharge Frequent or constant Supervision/Assistance  Patient can return home with the following Assist for transportation;A little help with walking and/or transfers;A lot of help with bathing/dressing/bathroom;Direct supervision/assist for medications management;Direct supervision/assist for financial management    Functional Status Assessment  Patient has had a recent decline in their functional status and demonstrates the ability to make significant improvements in function in a reasonable and predictable amount of time.  Equipment Recommendations  Other (comment) (Family would  benefit from a transport wheelchair)    Recommendations for Other Services       Precautions / Restrictions Precautions Precautions: Fall Precaution Comments: Covid+ Restrictions Weight Bearing Restrictions: No      Mobility Bed Mobility Overal bed mobility: Needs Assistance Bed Mobility: Supine to Sit     Supine to sit: Min guard          Transfers Overall transfer level: Needs assistance Equipment used: Rolling walker (2 wheels) Transfers: Sit to/from Stand, Bed to chair/wheelchair/BSC Sit to Stand: Min assist, Min guard Stand pivot transfers: Min assist                Balance Overall balance assessment: Needs assistance Sitting-balance support: Feet supported Sitting balance-Leahy Scale: Good     Standing balance support: Reliant on assistive device for balance Standing balance-Leahy Scale: Poor                             ADL either performed or assessed with clinical judgement   ADL       Grooming: Wash/dry hands;Wash/dry face;Min guard;Sitting   Upper Body Bathing: Minimal assistance;Sitting   Lower Body Bathing: Minimal assistance;Sit to/from stand   Upper Body Dressing : Min guard;Sitting   Lower Body Dressing: Minimal assistance;Sit to/from stand   Toilet Transfer: Minimal assistance;Rolling walker (2 wheels);BSC/3in1;Stand-pivot                   Vision Patient Visual Report: No change from baseline       Perception     Praxis      Pertinent Vitals/Pain Pain Assessment Pain Assessment: No/denies pain     Hand Dominance  Right   Extremity/Trunk Assessment Upper Extremity Assessment Upper Extremity Assessment: Generalized weakness   Lower Extremity Assessment Lower Extremity Assessment: Defer to PT evaluation   Cervical / Trunk Assessment Cervical / Trunk Assessment: Kyphotic   Communication Communication Communication: HOH   Cognition Arousal/Alertness: Awake/alert Behavior During Therapy: WFL  for tasks assessed/performed Overall Cognitive Status: History of cognitive impairments - at baseline                                 General Comments: Following commands well.     General Comments   VSS on RA    Exercises     Shoulder Instructions      Home Living Family/patient expects to be discharged to:: Private residence Living Arrangements: Children Available Help at Discharge: Family;Available 24 hours/day Type of Home: House Home Access: Stairs to enter [employer] of Steps: 1   Home Layout: One level     Bathroom Shower/Tub: Tub/shower unit;Walk-in shower   Bathroom Toilet: Handicapped height Bathroom Accessibility: Yes How Accessible: Accessible via walker Home Equipment: [place] (2 wheels);Cane - single point;Shower seat          Prior Functioning/Environment Prior Level of Function : Needs assist       Physical Assist : ADLs (physical)   ADLs (physical): Bathing;Dressing;IADLs Mobility Comments: Now using RW for mobility in the home. ADLs Comments: PCA 3x/wk for 16 hours to assist with bathing/showers.        OT Problem List: Decreased strength;Decreased activity tolerance;Impaired balance (sitting and/or standing);Decreased safety awareness      OT Treatment/Interventions: Self-care/ADL training;Therapeutic exercise;Therapeutic activities;Patient/family education;Balance training;DME and/or AE instruction    OT Goals(Current goals can be found in the care plan section) Acute Rehab OT Goals Patient Stated Goal: Return home OT Goal Formulation: With patient Time For Goal Achievement: 01/02/23 Potential to Achieve Goals: Good ADL Goals Pt Will Perform Grooming: with set-up;sitting;standing Pt Will Perform Upper Body Bathing: with set-up;sitting Pt Will Perform Upper Body Dressing: with set-up;sitting Pt Will Transfer to Toilet: with min guard assist;ambulating;regular height toilet Pt/caregiver will Perform  Home Exercise Program: Both right and left upper extremity;With theraband;With Supervision  OT Frequency: Min 2X/week    Co-evaluation              AM-PAC OT "6 Clicks" Daily Activity     Outcome Measure Help from another person eating meals?: None Help from another person taking care of personal grooming?: A Little Help from another person toileting, which includes using toliet, bedpan, or urinal?: A Little Help from another person bathing (including washing, rinsing, drying)?: A Lot Help from another person to put on and taking off regular upper body clothing?: A Little Help from another person to put on and taking off regular lower body clothing?: A Lot 6 Click Score: 17   End of Session Equipment Utilized During Treatment: Rolling walker (2 wheels) Nurse Communication: Mobility status  Activity Tolerance: Patient tolerated treatment well Patient left: in chair;with call bell/phone within reach;with family/visitor present  OT Visit Diagnosis: Unsteadiness on feet (R26.81);Muscle weakness (generalized) (M62.81)                Time: [id number] OT Time Calculation (min): 19 min Charges:  OT General Charges $OT Visit: 1 Visit OT Evaluation $OT Eval Moderate Complexity: 1 Mod  12/21/2022  RP, OTR/L  Acute Rehabilitation Services  Office:  [phone]   Metta Clines 12/21/2022, 9:33 AM

## 2022-12-21 NOTE — TOC Initial Note (Signed)
Transition of Care [facility]) - Initial/Assessment Note    Patient Details  Name: Erin Ryan MRN: [id number] Date of Birth: [birth year]  Transition of Care [facility]) CM/SW Contact:    Pollie Friar, RN Phone Number: 12/21/2022, 3:50 PM  Clinical Narrative:                 CM met with the patients daughter per hospice request. Daughter was under the impression she would get a lot more assistance at home with hospice services. She is very tearful and states she is struggling with patients care at home. After a long conversation she prefers the pt go to SNF rehab. She has been to [facility] in the past and daughter hopes she gets in there again. CM has sent update to CSW.  CM will also send referral to [facility] to assist the daughter in placement after a rehab stay.  Daughter agreeable to palliative care following her at [facility]. CM has updated [facility].  TOC following.  Expected Discharge Plan: Skilled Nursing Facility Barriers to Discharge: Continued Medical Work up   Patient Goals and CMS Choice   CMS Medicare.gov Compare Post Acute Care list provided to:: Patient Represenative (must comment) Choice offered to / list presented to : Adult Children      Expected Discharge Plan and Services In-house Referral: Clinical Social Work   Post Acute Care Choice: [place] Living arrangements for the past 2 months: [place]                                      Prior Living Arrangements/Services Living arrangements for the past 2 months: Single Family Home Lives with:: Adult Children          Need for Family Participation in Patient Care: Yes (Comment) Care giver support system in place?: Yes (comment)   Criminal Activity/Legal Involvement Pertinent to Current Situation/Hospitalization: No - Comment as needed  Activities of Daily Living Home Assistive Devices/Equipment: Hearing aid, Walker (specify type) ADL Screening (condition  at time of admission) Patient's cognitive ability adequate to safely complete daily activities?: No Is the patient deaf or have difficulty hearing?: Yes Does the patient have difficulty seeing, even when wearing glasses/contacts?: No Does the patient have difficulty concentrating, remembering, or making decisions?: Yes Patient able to express need for assistance with ADLs?: Yes Does the patient have difficulty dressing or bathing?: Yes Independently performs ADLs?: No Communication: Independent Dressing (OT): Needs assistance Is this a change from baseline?: Pre-admission baseline Grooming: Needs assistance Is this a change from baseline?: Pre-admission baseline Feeding: Independent Bathing: Needs assistance Is this a change from baseline?: Pre-admission baseline Toileting: Independent In/Out Bed: Independent with device (comment) (front wheel walker) Walks in Home: Independent Does the patient have difficulty walking or climbing stairs?: No Weakness of Legs: Right Weakness of Arms/Hands: None  Permission Sought/Granted                  Emotional Assessment Appearance:: Appears stated age         Psych Involvement: No (comment)  Admission diagnosis:  Respiratory distress [R06.03] Community acquired pneumonia, unspecified laterality [J18.9] Acute on chronic congestive heart failure, unspecified heart failure type [I50.9] COVID-19 [U07.1] Patient Active Problem List   Diagnosis Date Noted   Acute on chronic diastolic CHF (congestive heart failure) ([place]) 12/20/2022   Goals of care, counseling/discussion 12/20/2022   DNR (  do not resuscitate) 12/20/2022   Palliative care encounter 12/20/2022   Pneumonia due to COVID-19 virus 12/19/2022   Enterococcus UTI 08/13/2022   Paroxysmal atrial fibrillation (HCC) 08/13/2022   Generalized weakness 08/13/2022   Urinary tract infection without hematuria 08/13/2022   Protein-calorie malnutrition, severe 08/12/2022   History of  pulmonary embolism 08/10/2022   Moderate dementia without behavioral disturbance, psychotic disturbance, mood disturbance, or anxiety ([place]) 11/05/2021   Tachycardia 10/13/2021   Upper airway cough syndrome 07/10/2017   Dyspnea on exertion 05/14/2015   Anxiety state 06/26/2014   Chronic kidney disease, stage 3b ([place]) 06/26/2014   HLD (hyperlipidemia) 06/26/2014   Acid reflux 06/26/2014   Benign hypertensive kidney disease 06/26/2014   OP (osteoporosis) 06/26/2014   Peripheral neuralgia 06/26/2014   Adult hypothyroidism 06/26/2014   Arthralgia of hip or thigh 02/09/2014   GERD 12/06/2007   HYPERLIPIDEMIA 11/15/2007   Essential hypertension 11/15/2007   PCP:  Harlan Stains, MD Pharmacy:   [id number] [place], [place] [address] [place] [address] Phone: [phone] Fax: [phone]     Social Determinants of Health (SDOH) Social History: [place]: Patient Declined (08/15/2022)  Housing: Low Risk  (08/15/2022)  Transportation Needs: Patient Declined (08/15/2022)  Utilities: Patient Declined (08/15/2022)  Tobacco Use: Low Risk  (12/11/2022)   SDOH Interventions:     Readmission Risk Interventions     No data to display

## 2022-12-21 NOTE — Progress Notes (Signed)
Pts daughter states she would like to speak to PT as well as Case Management in the morning. She is quite unsure of appropriate disposition at discharge. She also voices concern in regard to patients BP meds and Lasix dosage-she is requesting a cardiology consult while patient is here. Jessie Foot, RN

## 2022-12-21 NOTE — Progress Notes (Signed)
PT Cancellation Note  Patient Details Name: Erin Ryan MRN: [id number] DOB: 1924-09-27   Cancelled Treatment:    Reason Eval/Treat Not Completed: Other (comment). Attempted at 10:05 and pt watching her [facility] service. Returned at 11:10 and pt still watching church services and wants to finish prior to PT. Will return later.   [place] 12/21/2022, 11:13 AM [place] Office [phone]

## 2022-12-21 NOTE — Plan of Care (Signed)
  Problem: Education: Goal: Ability to demonstrate management of disease process will improve Outcome: Progressing Goal: Ability to verbalize understanding of medication therapies will improve Outcome: Progressing Goal: Individualized Educational Video(s) Outcome: Progressing   Problem: Activity: Goal: Capacity to carry out activities will improve Outcome: Progressing   Problem: Cardiac: Goal: Ability to achieve and maintain adequate cardiopulmonary perfusion will improve Outcome: Progressing   Problem: Education: Goal: Knowledge of risk factors and measures for prevention of condition will improve Outcome: Progressing   Problem: Coping: Goal: Psychosocial and spiritual needs will be supported Outcome: Progressing   Problem: Respiratory: Goal: Will maintain a patent airway Outcome: Progressing Goal: Complications related to the disease process, condition or treatment will be avoided or minimized Outcome: Progressing   Problem: Education: Goal: Knowledge of General Education information will improve Description: Including pain rating scale, medication(s)/side effects and non-pharmacologic comfort measures Outcome: Progressing   Problem: Health Behavior/Discharge Planning: Goal: Ability to manage health-related needs will improve Outcome: Progressing   Problem: Clinical Measurements: Goal: Ability to maintain clinical measurements within normal limits will improve Outcome: Progressing Goal: Will remain free from infection Outcome: Progressing Goal: Diagnostic test results will improve Outcome: Progressing Goal: Respiratory complications will improve Outcome: Progressing Goal: Cardiovascular complication will be avoided Outcome: Progressing   Problem: Activity: Goal: Risk for activity intolerance will decrease Outcome: Progressing   Problem: Nutrition: Goal: Adequate nutrition will be maintained Outcome: Progressing   Problem: Coping: Goal: Level of anxiety  will decrease Outcome: Progressing   Problem: Elimination: Goal: Will not experience complications related to bowel motility Outcome: Progressing Goal: Will not experience complications related to urinary retention Outcome: Progressing   Problem: Pain Managment: Goal: General experience of comfort will improve Outcome: Progressing   Problem: Safety: Goal: Ability to remain free from injury will improve Outcome: Progressing   Problem: Skin Integrity: Goal: Risk for impaired skin integrity will decrease Outcome: Progressing   

## 2022-12-21 NOTE — NC FL2 (Signed)
[place] LEVEL OF CARE FORM     IDENTIFICATION  [name] Birthdate: 07/18/1925 Sex: female Admission Date (Current Location): 12/19/2022  [facility] and [place] Number:  Herbalist and Address:  The . [facility], [place] [address] [place], [place] [address]      Provider Number: [id number]  Attending Physician Name and Address:  Mercy Riding, MD  Relative Name and Phone Number:       Current Level of Care: Hospital Recommended Level of Care: [place] Prior Approval Number:    Date Approved/Denied:   PASRR Number: [id number]  Discharge Plan: SNF    Current Diagnoses: Patient Active Problem List   Diagnosis Date Noted   Acute on chronic diastolic CHF (congestive heart failure) ([place]) 12/20/2022   Goals of care, counseling/discussion 12/20/2022   DNR (do not resuscitate) 12/20/2022   Palliative care encounter 12/20/2022   Pneumonia due to COVID-19 virus 12/19/2022   Enterococcus UTI 08/13/2022   Paroxysmal atrial fibrillation ([place]) 08/13/2022   Generalized weakness 08/13/2022   Urinary tract infection without hematuria 08/13/2022   Protein-calorie malnutrition, severe 08/12/2022   History of pulmonary embolism 08/10/2022   Moderate dementia without behavioral disturbance, psychotic disturbance, mood disturbance, or anxiety ([place]) 11/05/2021   Tachycardia 10/13/2021   Upper airway cough syndrome 07/10/2017   Dyspnea on exertion 05/14/2015   Anxiety state 06/26/2014   Chronic kidney disease, stage 3b ([place]) 06/26/2014   HLD (hyperlipidemia) 06/26/2014   Acid reflux 06/26/2014   Benign hypertensive kidney disease 06/26/2014   OP (osteoporosis) 06/26/2014   Peripheral neuralgia 06/26/2014   Adult hypothyroidism 06/26/2014   Arthralgia of hip or thigh 02/09/2014   GERD 12/06/2007   HYPERLIPIDEMIA 11/15/2007   Essential hypertension 11/15/2007    Orientation RESPIRATION BLADDER Height & Weight      Self, Place  Normal Continent Weight: 61.2 kg Height:  5\' 2"  (157.5 cm)  BEHAVIORAL SYMPTOMS/MOOD NEUROLOGICAL BOWEL NUTRITION STATUS      Continent Diet (heart healthy with thin liquids)  AMBULATORY STATUS COMMUNICATION OF NEEDS Skin   Limited Assist   Normal                       Personal Care Assistance Level of Assistance  Bathing, Feeding, Dressing Bathing Assistance: Limited assistance Feeding assistance: Independent Dressing Assistance: Limited assistance     Functional Limitations Info  Sight, Hearing, Speech Sight Info: Impaired Hearing Info: Impaired Speech Info: Adequate    SPECIAL CARE FACTORS FREQUENCY  PT (By licensed PT), OT (By licensed OT)     PT Frequency: 5x/wk OT Frequency: 5x/wk            Contractures Contractures Info: Not present    Additional Factors Info  Allergies, Code Status, Psychotropic Code Status Info: DNR Allergies Info: Ambrosia Trifida (Tall Ragweed) Allergy Skin Test, Hydrochlorothiazide, Lyrica (Pregabalin), Diltiazem Hcl Er, Short Ragweed Pollen Ext Psychotropic Info: Aricept 10 mg at bedtime/ Cymbalta Dr 30 mg daily/ Remeron 7.5 ng at bedtime.         Current Medications (12/21/2022):  This is the current hospital active medication list Current Facility-Administered Medications  Medication Dose Route Frequency Provider Last Rate Last Admin   acetaminophen (TYLENOL) tablet 650 mg  650 mg Oral Q6H PRN Clance Boll, MD       Or   acetaminophen (TYLENOL) suppository 650 mg  650 mg Rectal Q6H PRN Clance Boll, MD  albuterol (PROVENTIL) (2.5 MG/3ML) 0.083% nebulizer solution 2.5 mg  2.5 mg Nebulization Q2H PRN Myles Rosenthal A, MD       amLODipine (NORVASC) tablet 2.5 mg  2.5 mg Oral Daily Myles Rosenthal A, MD   2.5 mg at 12/21/22 0933   apixaban (ELIQUIS) tablet 2.5 mg  2.5 mg Oral BID Wendee Beavers T, MD   2.5 mg at 12/21/22 0933   atorvastatin (LIPITOR) tablet 40 mg  40 mg Oral Daily Myles Rosenthal A, MD   40 mg at 12/21/22 0933   cefTRIAXone (ROCEPHIN) 1 g in sodium chloride 0.9 % 100 mL IVPB  1 g Intravenous Q24H Wendee Beavers T, MD 200 mL/hr at 12/20/22 1706 1 g at 12/20/22 1706   Chlorhexidine Gluconate Cloth 2 % PADS 6 each  6 each Topical Q0600 Clance Boll, MD   6 each at 12/21/22 0559   dexamethasone (DECADRON) tablet 6 mg  6 mg Oral QHS Myles Rosenthal A, MD   6 mg at 12/21/22 0021   donepezil (ARICEPT) tablet 10 mg  10 mg Oral QHS Wendee Beavers T, MD   10 mg at 12/20/22 2124   DULoxetine (CYMBALTA) DR capsule 30 mg  30 mg Oral Daily Wendee Beavers T, MD   30 mg at 12/21/22 0933   levothyroxine (SYNTHROID) tablet 100 mcg  100 mcg Oral Q0600 Myles Rosenthal A, MD   100 mcg at 12/21/22 0557   metoprolol tartrate (LOPRESSOR) tablet 75 mg  75 mg Oral BID Myles Rosenthal A, MD   75 mg at 12/21/22 0933   mirtazapine (REMERON) tablet 7.5 mg  7.5 mg Oral QHS Wendee Beavers T, MD   7.5 mg at 12/20/22 2124   ondansetron (ZOFRAN) tablet 4 mg  4 mg Oral Q6H PRN Clance Boll, MD       Or   ondansetron [facility]) injection 4 mg  4 mg Intravenous Q6H PRN Clance Boll, MD       pantoprazole (PROTONIX) EC tablet 40 mg  40 mg Oral Daily Myles Rosenthal A, MD   40 mg at 12/21/22 [id number]   polyvinyl alcohol (LIQUIFILM TEARS) 1.4 % ophthalmic solution 1 drop  1 drop Both Eyes PRN Mercy Riding, MD         Discharge Medications: Please see discharge summary for a list of discharge medications.  Relevant Imaging Results:  Relevant Lab Results:   Additional Information SS#: [id number]  Pollie Friar, RN

## 2022-12-22 ENCOUNTER — Encounter (HOSPITAL_COMMUNITY): Payer: Self-pay | Admitting: Internal Medicine

## 2022-12-22 ENCOUNTER — Other Ambulatory Visit: Payer: Self-pay | Admitting: Student

## 2022-12-22 ENCOUNTER — Other Ambulatory Visit: Payer: Self-pay

## 2022-12-22 DIAGNOSIS — Z7189 Other specified counseling: Secondary | ICD-10-CM | POA: Diagnosis not present

## 2022-12-22 DIAGNOSIS — U071 COVID-19: Secondary | ICD-10-CM | POA: Diagnosis not present

## 2022-12-22 DIAGNOSIS — Z86711 Personal history of pulmonary embolism: Secondary | ICD-10-CM | POA: Diagnosis not present

## 2022-12-22 DIAGNOSIS — N1832 Chronic kidney disease, stage 3b: Secondary | ICD-10-CM | POA: Diagnosis not present

## 2022-12-22 LAB — RENAL FUNCTION PANEL
Albumin: 2.1 g/dL — ABNORMAL LOW (ref 3.5–5.0)
Anion gap: 11 (ref 5–15)
BUN: 73 mg/dL — ABNORMAL HIGH (ref 8–23)
CO2: 22 mmol/L (ref 22–32)
Calcium: 7.9 mg/dL — ABNORMAL LOW (ref 8.9–10.3)
Chloride: 101 mmol/L (ref 98–111)
Creatinine, Ser: 1.49 mg/dL — ABNORMAL HIGH (ref 0.44–1.00)
GFR, Estimated: 32 mL/min — ABNORMAL LOW (ref >60)
Glucose, Bld: 112 mg/dL — ABNORMAL HIGH (ref 70–99)
Phosphorus: 4.2 mg/dL (ref 2.5–4.6)
Potassium: 3.7 mmol/L (ref 3.5–5.1)
Sodium: 134 mmol/L — ABNORMAL LOW (ref 135–145)

## 2022-12-22 LAB — URINE CULTURE: Culture: NO GROWTH

## 2022-12-22 LAB — MAGNESIUM: Magnesium: 1.9 mg/dL (ref 1.7–2.4)

## 2022-12-22 NOTE — Progress Notes (Signed)
PROGRESS NOTE  Erin Ryan [id number] DOB: 04/21/1925   PCP: Harlan Stains, MD  Patient is from: Home.  Lives with daughter.  Dependent for some ADLS  DOA: [id number] LOS: 3  Chief complaints Chief Complaint  Patient presents with   Shortness of Breath     Brief Narrative / Interim history: 89 year old F with PMH of dementia, diastolic CHF, PE/A-fib on Eliquis, CKD-3B, HTN, hypothyroidism, anxiety and GERD presenting with SOB, DOE, weakness, BLE edema and confusion for 2 to 3 days, and admitted for COVID-19 infection.  Vital stable except for mild temp to 100.1.  CXR raises concern for possible LLL airspace disease and effusion at the left base.  COVID-19 PCR positive.  BNP 321 (523 previously).  Patient was started on Decadron, remdesivir, ceftriaxone, Zithromax and Lasix and admitted.  Palliative medicine consulted.  Hospice referral placed but daughter thinking about SNF or home with home health.    Subjective: Seen and examined earlier this morning.  No major events overnight of this morning.  No complaints.  Daughter at bedside.  Per daughter, patient was confused overnight.  Daughter feels sick.  She thinks she might have gotten COVID as well.  She would like to go home and rest.  Patient denies shortness of breath, pain, nausea or vomiting.  Objective: Vitals:   12/21/22 2043 12/22/22 0525 12/22/22 0936 12/22/22 1144  BP: 127/79 (!) 141/51 (!) 140/53 (!) 134/55  Pulse: 68 60 71 (!) 57  Resp:  20  19  Temp:  97.6 F (36.4 C)  98.9 F (37.2 C)  TempSrc:  Oral  Oral  SpO2: 94% 93%    Weight:  63.5 kg    Height:        Examination:  GENERAL: No apparent distress.  Nontoxic. HEENT: MMM.  Vision and hearing grossly intact.  NECK: Supple.  No apparent JVD.  RESP:  No IWOB.  Fair aeration bilaterally. CVS:  RRR. Heart sounds normal.  ABD/GI/GU: BS+. Abd soft, NTND.  MSK/EXT:   No apparent deformity. Moves extremities.  Trace BLE edema. SKIN: no apparent skin lesion  or wound NEURO: Awake and alert. Oriented to self, person and hospital.  No apparent focal neuro deficit. PSYCH: Calm. Normal affect.   Procedures:  None  Microbiology summarized: COVID-19 PCR positive  Assessment and plan: Principal Problem:   Pneumonia due to COVID-19 virus Active Problems:   Essential hypertension   GERD   Chronic kidney disease, stage 3b   Adult hypothyroidism   Moderate dementia without behavioral disturbance, psychotic disturbance, mood disturbance, or anxiety   History of pulmonary embolism   Paroxysmal atrial fibrillation   Generalized weakness   Acute on chronic diastolic CHF (congestive heart failure)   Goals of care, counseling/discussion   DNR (do not resuscitate)   Palliative care encounter  Pneumonia due to COVID-19 virus: No significant respiratory distress.  CXR with possible LLL airspace opacity and a small left base pleural effusion.  No documented desaturation.  VBG reassuring. -Completed 3 days of remdesivir -Discontinue Decadron given delirium.  Has no significant respiratory.  -DVT prophylaxis -OOB/PT/OT   Acute on chronic diastolic CHF: Reportedly had increased LE edema, SOB and DOE.  Exam with BLE edema.  CXR as above.  BNP elevated but lower than prior value.  TTE with LVEF of 70 to 75%, G1-DD.  On p.o. Lasix 20 mg daily at home.  Received some IV Lasix.  Cr went up.  No significant respiratory distress. -Continue holding Lasix -Strict intake and output,  daily weight, renal functions and electrolytes  CKD-3B: Cr slightly up. Recent Labs    08/15/22 0307 08/16/22 0551 08/17/22 0524 08/18/22 0418 08/19/22 0449 12/18/22 1553 12/19/22 1757 12/20/22 0231 12/21/22 0217 12/22/22 0146  BUN 46* 46* 47* 42* 39* 48* 46* 40* 59* 73*  CREATININE 1.29* 1.21* 1.22* 1.25* 1.30* 1.51* 1.57* 1.51* 1.61* 1.49*  -Disc kidney diuretics -Recheck in the morning  Dementia without behavioral disturbance: Fairly oriented for most part.  Confused  at night. -Discontinue Decadron. -Treat treatable causes -Reorientation and delirium precaution  Acute urinary tension/frequent urination: Had Foley catheter inserted the night of admission.  Per daughter, about 900 cc urine output after Foley insertion.  UA with large LE and rare bacteria. -Continue Foley catheter -Voiding trial prior to discharge -Continue IV ceftriaxone pending urine culture  Paroxysmal atrial fibrillation: Currently in sinus rhythm. -continue  on metoprolol and Eliquis  Goal of care counseling: DNR/DNI. -Appreciate help by palliative medicine -Daughter prefers rehab or home health   Hypertension: Normotensive -continue amlodipine    Hyperlipidemia -continue atorvastatin     Hypothyroidism: TSH within normal. -continue synthroid     Anxiety -continue remeron at reduced dose.   GERD, -ppi    History of pulmonary embolism -on Elilquis   Generalized weakness -PT/OT     Body mass index is 25.61 kg/m.         DVT prophylaxis:  apixaban (ELIQUIS) tablet 2.5 mg Start: 12/20/22 1330Patient is on Eliquis. apixaban (ELIQUIS) tablet 2.5 mg  Code Status: DNR/DNI Family Communication: Updated patient's daughter at bedside Level of care: Med-Surg Status is: Inpatient Remains inpatient appropriate because: COVID-19 pneumonia, acute CHF, generalized weakness and possible UTI   Final disposition: SNF? Consultants:  Palliative medicine  35 minutes with more than 50% spent in reviewing records, counseling patient/family and coordinating care.   Sch Meds:  Scheduled Meds:  amLODipine  2.5 mg Oral Daily   apixaban  2.5 mg Oral BID   atorvastatin  40 mg Oral Daily   Chlorhexidine Gluconate Cloth  6 each Topical Q0600   donepezil  10 mg Oral QHS   DULoxetine  30 mg Oral Daily   levothyroxine  100 mcg Oral Q0600   metoprolol tartrate  75 mg Oral BID   mirtazapine  7.5 mg Oral QHS   pantoprazole  40 mg Oral Daily   Continuous Infusions:   cefTRIAXone (ROCEPHIN)  IV Stopped (12/21/22 1738)   PRN Meds:.acetaminophen **OR** acetaminophen, albuterol, ondansetron **OR** ondansetron (ZOFRAN) IV, polyvinyl alcohol  Antimicrobials: Anti-infectives (From admission, onward)    Start     Dose/Rate Route Frequency Ordered Stop   12/20/22 1615  cefTRIAXone (ROCEPHIN) 1 g in sodium chloride 0.9 % 100 mL IVPB        1 g 200 mL/hr over 30 Minutes Intravenous Every 24 hours 12/20/22 1516 12/23/22 1614   12/20/22 1400  remdesivir 100 mg in sodium chloride 0.9 % 100 mL IVPB       See Hyperspace for full Linked Orders Report.   100 mg 200 mL/hr over 30 Minutes Intravenous Daily 12/19/22 2341 12/21/22 1030   12/19/22 2345  remdesivir 200 mg in sodium chloride 0.9% 250 mL IVPB       See Hyperspace for full Linked Orders Report.   200 mg 580 mL/hr over 30 Minutes Intravenous Once 12/19/22 2341 12/20/22 0149   12/19/22 1915  cefTRIAXone (ROCEPHIN) 1 g in sodium chloride 0.9 % 100 mL IVPB        1 g 200  mL/hr over 30 Minutes Intravenous  Once 12/19/22 1907 12/19/22 2049   12/19/22 1915  azithromycin (ZITHROMAX) 500 mg in sodium chloride 0.9 % 250 mL IVPB        500 mg 250 mL/hr over 60 Minutes Intravenous  Once 12/19/22 1907 12/19/22 2228        I have personally reviewed the following labs and images: CBC: Recent Labs  Lab 12/19/22 1757 12/19/22 1808  WBC 8.4  --   NEUTROABS 6.1  --   HGB 13.3 13.6  HCT 41.1 40.0  MCV 96.0  --   PLT 258  --    BMP &GFR Recent Labs  Lab 12/18/22 1553 12/19/22 1757 12/19/22 1808 12/20/22 0231 12/21/22 0217 12/22/22 0146  NA 141 137 136 138 139 134*  K 4.8 4.2 4.1 3.6 3.9 3.7  CL 102 100  --  99 103 101  CO2 20 22  --  23 24 22   GLUCOSE 109* 129*  --  134* 150* 112*  BUN 48* 46*  --  40* 59* 73*  CREATININE 1.51* 1.57*  --  1.51* 1.61* 1.49*  CALCIUM 8.9 8.8*  --  7.9* 8.1* 7.9*  MG  --   --   --   --  1.8 1.9  PHOS  --   --   --   --  3.5 4.2   Estimated Creatinine Clearance: 19.8  mL/min (A) (by C-G formula based on SCr of 1.49 mg/dL (H)). Liver & Pancreas: Recent Labs  Lab 12/19/22 1757 12/20/22 0231 12/21/22 0217 12/22/22 0146  AST 37 27  --   --   ALT 24 24  --   --   ALKPHOS 70 60  --   --   BILITOT 0.4 0.4  --   --   PROT 7.1 6.0*  --   --   ALBUMIN 3.1* 2.6* 2.2* 2.1*   No results for input(s): "LIPASE", "AMYLASE" in the last 168 hours. No results for input(s): "AMMONIA" in the last 168 hours. Diabetic: No results for input(s): "HGBA1C" in the last 72 hours. No results for input(s): "GLUCAP" in the last 168 hours. Cardiac Enzymes: No results for input(s): "CKTOTAL", "CKMB", "CKMBINDEX", "TROPONINI" in the last 168 hours. No results for input(s): "PROBNP" in the last 8760 hours. Coagulation Profile: No results for input(s): "INR", "PROTIME" in the last 168 hours. Thyroid Function Tests: Recent Labs    12/20/22 0231  TSH 0.696   Lipid Profile: No results for input(s): "CHOL", "HDL", "LDLCALC", "TRIG", "CHOLHDL", "LDLDIRECT" in the last 72 hours. Anemia Panel: No results for input(s): "VITAMINB12", "FOLATE", "FERRITIN", "TIBC", "IRON", "RETICCTPCT" in the last 72 hours. Urine analysis:    Component Value Date/Time   COLORURINE YELLOW 12/20/2022 0958   APPEARANCEUR HAZY (A) 12/20/2022 0958   LABSPEC 1.014 12/20/2022 0958   PHURINE 5.0 12/20/2022 0958   GLUCOSEU NEGATIVE 12/20/2022 0958   HGBUR NEGATIVE 12/20/2022 0958   BILIRUBINUR NEGATIVE 12/20/2022 0958   KETONESUR NEGATIVE 12/20/2022 0958   PROTEINUR NEGATIVE 12/20/2022 0958   NITRITE NEGATIVE 12/20/2022 0958   LEUKOCYTESUR LARGE (A) 12/20/2022 0958   Sepsis Labs: Invalid input(s): "PROCALCITONIN", "LACTICIDVEN"  Microbiology: Recent Results (from the past 240 hour(s))  SARS Coronavirus 2 by RT PCR (hospital order, performed in [facility] hospital lab) *cepheid single result test* Anterior Nasal Swab     Status: Abnormal   Collection Time: 12/19/22  5:52 PM   Specimen: Anterior  Nasal Swab  Result Value Ref Range Status   SARS  Coronavirus 2 by RT PCR POSITIVE (A) NEGATIVE Final    Comment: Performed at [facility], [place] [address], [place], [place] [address]    Radiology Studies: No results found.    Darene Nappi T. Franklin  If 7PM-7AM, please contact night-coverage [email or web address] 12/22/2022, 12:43 PM

## 2022-12-22 NOTE — TOC Progression Note (Addendum)
Transition of Care [facility]) - Progression Note    Patient Details  Name: Erin Ryan MRN: [id number] Date of Birth: [birth year]  Transition of Care [facility] CM/SW [place], [place] Phone Number: 12/22/2022, 1:17 PM  Clinical Narrative:     CSW faxed referral to [facility]. Received call back and was informed they cannot offer bed to pt due to covid. They cannot accept covid pt's until day 11 after covid+ test. Pt has no other bed offers at this time.   CSW called pt's daughter; left voicemail requesting return call. Attempted to call room phone; room phone unavailable.   1540: [facility] can offer bed with 43$/day private room fee. CSW called pt's daughter though goes straight to voicemail. CSW called pt's Granddaughter and left voicemail requesting return call.   1610: Received call from daughter [name] who is agreeable to [facility]. CSW updated [place] liaison  Expected Discharge Plan: Skilled Nursing Facility Barriers to Discharge: SNF Pending bed offer  Expected Discharge Plan and Services In-house Referral: Clinical Social Work   Post Acute Care Choice: [place] Living arrangements for the past 2 months: Single Family Home                                       Social Determinants of Health (SDOH) Interventions SDOH Screenings   Food Insecurity: No Food Insecurity (12/22/2022)  Housing: Low Risk  (12/22/2022)  Transportation Needs: No Transportation Needs (12/22/2022)  Utilities: Not At Risk (12/22/2022)  Tobacco Use: Low Risk  (12/22/2022)    Readmission Risk Interventions     No data to display

## 2022-12-22 NOTE — Care Management Important Message (Signed)
Important Message  Patient Details  Name: Erin Ryan MRN: [id number] Date of Birth: [birth year]   Medicare Important Message Given:  Yes     Shelda Altes 12/22/2022, 9:00 AM

## 2022-12-22 NOTE — Progress Notes (Signed)
Physical Therapy Treatment Patient Details Name: Erin Ryan MRN: [id number] DOB: 20-Mar-1925 Today's Date: 12/22/2022   History of Present Illness 89 y.o. female admitted with SOB/DOE, weakness, confusion x 2 -3 days. Pt found to be Covid +. PMH -  Afib, hypertension, hypothyroidism, Anxiety , arthritis, GERD, hx of PE, CKDIIIa    PT Comments    Entered room to find pt up OOB holding end of bed with foley wrapped around bed. Daughter present but not assisting. Assisted pt to amb short distances in room with sitting rest due to fatigue. Patient will benefit from continued inpatient follow up therapy, <3 hours/day    Recommendations for follow up therapy are one component of a multi-disciplinary discharge planning process, led by the attending physician.  Recommendations may be updated based on patient status, additional functional criteria and insurance authorization.  Follow Up Recommendations  Can patient physically be transported by private vehicle: Yes    Assistance Recommended at Discharge Frequent or constant Supervision/Assistance  Patient can return home with the following A little help with walking and/or transfers;A little help with bathing/dressing/bathroom;Assistance with cooking/housework;Assist for transportation;Help with stairs or ramp for entrance   Equipment Recommendations  None recommended by PT    Recommendations for Other Services       Precautions / Restrictions Precautions Precautions: Fall Precaution Comments: Covid+ Required Braces or Orthoses: Other Brace Other Brace: rt knee brace helpful for amb     Mobility  Bed Mobility               General bed mobility comments: Pt standing at end of bed with foley catheter wrapped around bed frame.    Transfers Overall transfer level: Needs assistance Equipment used: Rollator (4 wheels) Transfers: Sit to/from Stand Sit to Stand: Min assist           General transfer comment: Assist to power up  and for balance. Verbal cues for hand placement    Ambulation/Gait Ambulation/Gait assistance: Min assist Gait Distance (Feet): 20 Feet (20' x 1, 10' x 1) Assistive device: Rollator (4 wheels) Gait Pattern/deviations: Step-through pattern, Decreased stance time - right, Knee flexed in stance - right, Knee flexed in stance - left (genu valgus bil knee) Gait velocity: decr Gait velocity interpretation: <1.31 ft/sec, indicative of household ambulator   General Gait Details: Assist for balance and support. Verbal cues to stand more upright.   Stairs             Wheelchair Mobility    Modified Rankin (Stroke Patients Only)       Balance Overall balance assessment: Needs assistance Sitting-balance support: Feet supported, No upper extremity supported Sitting balance-Leahy Scale: Good     Standing balance support: Single extremity supported, Bilateral upper extremity supported, Reliant on assistive device for balance, During functional activity Standing balance-Leahy Scale: Poor Standing balance comment: UE support and min guard for static standing                            Cognition Arousal/Alertness: Awake/alert Behavior During Therapy: Impulsive Overall Cognitive Status: History of cognitive impairments - at baseline                                          Exercises      General Comments General comments (skin integrity, edema, etc.): VSS on RA  Pertinent Vitals/Pain      Home Living                          Prior Function            PT Goals (current goals can now be found in the care plan section) Acute Rehab PT Goals Patient Stated Goal: go home Progress towards PT goals: Progressing toward goals    Frequency    Min 1X/week      PT Plan Current plan remains appropriate    Co-evaluation              AM-PAC PT "6 Clicks" Mobility   Outcome Measure  Help needed turning from your back to  your side while in a flat bed without using bedrails?: A Little Help needed moving from lying on your back to sitting on the side of a flat bed without using bedrails?: A Little Help needed moving to and from a bed to a chair (including a wheelchair)?: A Little Help needed standing up from a chair using your arms (e.g., wheelchair or bedside chair)?: A Little Help needed to walk in hospital room?: A Little Help needed climbing 3-5 steps with a railing? : A Lot 6 Click Score: 17    End of Session   Activity Tolerance: Patient tolerated treatment well Patient left: in chair;with call bell/phone within reach;with chair alarm set;with family/visitor present Nurse Communication: Mobility status PT Visit Diagnosis: Unsteadiness on feet (R26.81);Other abnormalities of gait and mobility (R26.89);Muscle weakness (generalized) (M62.81)     Time: [id number] PT Time Calculation (min) (ACUTE ONLY): 26 min  Charges:  $Gait Training: 23-37 mins                     [place] [facility] [place] 12/22/2022, 2:40 PM

## 2022-12-22 NOTE — Progress Notes (Signed)
Patients daughter notified RN that she feels unwell and thinks she might have gotten covid from patient. RN instructed daughter that she needs to wear a mask when coming out to the nurses station.

## 2022-12-23 DIAGNOSIS — F03B0 Unspecified dementia, moderate, without behavioral disturbance, psychotic disturbance, mood disturbance, and anxiety: Secondary | ICD-10-CM | POA: Diagnosis not present

## 2022-12-23 DIAGNOSIS — I480 Paroxysmal atrial fibrillation: Secondary | ICD-10-CM | POA: Diagnosis not present

## 2022-12-23 DIAGNOSIS — Z515 Encounter for palliative care: Secondary | ICD-10-CM | POA: Diagnosis not present

## 2022-12-23 DIAGNOSIS — U071 COVID-19: Secondary | ICD-10-CM | POA: Diagnosis not present

## 2022-12-23 LAB — MAGNESIUM: Magnesium: 2 mg/dL (ref 1.7–2.4)

## 2022-12-23 LAB — RENAL FUNCTION PANEL
Albumin: 2.1 g/dL — ABNORMAL LOW (ref 3.5–5.0)
Anion gap: 11 (ref 5–15)
BUN: 70 mg/dL — ABNORMAL HIGH (ref 8–23)
CO2: 23 mmol/L (ref 22–32)
Calcium: 8 mg/dL — ABNORMAL LOW (ref 8.9–10.3)
Chloride: 105 mmol/L (ref 98–111)
Creatinine, Ser: 1.34 mg/dL — ABNORMAL HIGH (ref 0.44–1.00)
GFR, Estimated: 37 mL/min — ABNORMAL LOW (ref >60)
Glucose, Bld: 111 mg/dL — ABNORMAL HIGH (ref 70–99)
Phosphorus: 4.3 mg/dL (ref 2.5–4.6)
Potassium: 4.1 mmol/L (ref 3.5–5.1)
Sodium: 139 mmol/L (ref 135–145)

## 2022-12-23 MED ORDER — FUROSEMIDE 20 MG PO TABS: 20.0000 mg | ORAL_TABLET | Freq: Every day | ORAL | 3 refills | Status: DC

## 2022-12-23 MED ORDER — BETHANECHOL CHLORIDE 10 MG PO TABS: 10.0000 mg | ORAL_TABLET | Freq: Three times a day (TID) | ORAL | Status: DC

## 2022-12-23 MED ORDER — MIRTAZAPINE 7.5 MG PO TABS: 7.5000 mg | ORAL_TABLET | Freq: Every day | ORAL | Status: DC

## 2022-12-23 NOTE — Plan of Care (Signed)
  Problem: Education: Goal: Ability to demonstrate management of disease process will improve Outcome: Adequate for Discharge Goal: Ability to verbalize understanding of medication therapies will improve Outcome: Adequate for Discharge Goal: Individualized Educational Video(s) Outcome: Adequate for Discharge   Problem: Activity: Goal: Capacity to carry out activities will improve Outcome: Adequate for Discharge   Problem: Cardiac: Goal: Ability to achieve and maintain adequate cardiopulmonary perfusion will improve Outcome: Adequate for Discharge   Problem: Education: Goal: Knowledge of risk factors and measures for prevention of condition will improve Outcome: Adequate for Discharge   Problem: Coping: Goal: Psychosocial and spiritual needs will be supported Outcome: Adequate for Discharge   Problem: Respiratory: Goal: Will maintain a patent airway Outcome: Adequate for Discharge Goal: Complications related to the disease process, condition or treatment will be avoided or minimized Outcome: Adequate for Discharge   Problem: Education: Goal: Knowledge of General Education information will improve Description: Including pain rating scale, medication(s)/side effects and non-pharmacologic comfort measures Outcome: Adequate for Discharge   Problem: Health Behavior/Discharge Planning: Goal: Ability to manage health-related needs will improve Outcome: Adequate for Discharge   Problem: Clinical Measurements: Goal: Ability to maintain clinical measurements within normal limits will improve Outcome: Adequate for Discharge Goal: Will remain free from infection Outcome: Adequate for Discharge Goal: Diagnostic test results will improve Outcome: Adequate for Discharge Goal: Respiratory complications will improve Outcome: Adequate for Discharge Goal: Cardiovascular complication will be avoided Outcome: Adequate for Discharge   Problem: Activity: Goal: Risk for activity  intolerance will decrease Outcome: Adequate for Discharge   Problem: Nutrition: Goal: Adequate nutrition will be maintained Outcome: Adequate for Discharge   Problem: Coping: Goal: Level of anxiety will decrease Outcome: Adequate for Discharge   Problem: Elimination: Goal: Will not experience complications related to bowel motility Outcome: Adequate for Discharge Goal: Will not experience complications related to urinary retention Outcome: Adequate for Discharge   Problem: Pain Managment: Goal: General experience of comfort will improve Outcome: Adequate for Discharge   Problem: Safety: Goal: Ability to remain free from injury will improve Outcome: Adequate for Discharge   Problem: Skin Integrity: Goal: Risk for impaired skin integrity will decrease Outcome: Adequate for Discharge

## 2022-12-23 NOTE — Discharge Summary (Signed)
Physician Discharge Summary  Erin Ryan [id number] DOB: 1925-04-30 DOA: 12/19/2022  PCP: Harlan Stains, MD  Admit date: 12/19/2022 Discharge date: 12/23/2022 Admitted From: Home Disposition: SNF Recommendations for Outpatient Follow-up:  Follow up with cardiology in 1 to 2 weeks Check fluid status, CMP and CBC in 1 week Continue isolation precaution for COVID.  Tested positive on 3/29 Voiding trial in 4 days.  Outpatient follow-up with urology if she fails voiding trial Please follow up on the following pending results: None   Discharge Condition: Stable but guarded prognosis CODE STATUS: DNR/DNI  Follow-up Information     [facility] Follow up.   Why: The palliative care will follow you at the rehab Contact information: [facility] 89 year old F with PMH of dementia, diastolic CHF, PE/A-fib on Eliquis, CKD-3B, HTN, hypothyroidism, anxiety and GERD presenting with SOB, DOE, weakness, BLE edema and confusion for 2 to 3 days, and admitted for COVID-19 infection.  Vital stable except for mild temp to 100.1.  CXR raises concern for possible LLL airspace disease and effusion at the left base.  COVID-19 PCR positive.  BNP 87 (523 previously).  Patient was started on Decadron, remdesivir, ceftriaxone, Zithromax and Lasix and admitted.  Patient was continued on Decadron and remdesivir.  She was also continued on ceftriaxone for possible urinary tract infection.  She completed 3 days of remdesivir and Decadron.  Respiratory symptoms resolved.  Mental status improved.  In regards to possible UTI, urine culture negative.  She completed 4 days of IV ceftriaxone empirically.  Patient was treated with IV Lasix and increased dose of p.o. Lasix for possible CHF/leg edema but Cr up trended.  Diuretics discontinued.  Creatinine improved.  Recommend holding Lasix for 1 week.  Outpatient follow-up  with cardiology in 1 to 2 weeks or sooner if needed.  Therapy recommended SNF.   See individual problem list below for more.   Problems addressed during this hospitalization Principal Problem:   Pneumonia due to COVID-19 virus Active Problems:   Essential hypertension   GERD   Chronic kidney disease, stage 3b   Adult hypothyroidism   Moderate dementia without behavioral disturbance, psychotic disturbance, mood disturbance, or anxiety   History of pulmonary embolism   Paroxysmal atrial fibrillation   Generalized weakness   Acute on chronic diastolic CHF (congestive heart failure)   Goals of care, counseling/discussion   DNR (do not resuscitate)   Palliative care encounter   Pneumonia due to COVID-19 virus: No significant respiratory distress.  CXR with possible LLL airspace opacity and a small left base pleural effusion.  No documented desaturation.  VBG reassuring. Received Decadron and remdesivir for 3 days.  Respiratory symptoms resolved. -Continue isolation precaution per unit protocol.  Tested positive on 3/29.   Acute on chronic diastolic CHF: Reportedly had increased LE edema, SOB and DOE.  Exam with BLE edema.  CXR as above.  BNP elevated but lower than prior value.  TTE with LVEF of 70 to 75%, G1-DD.  Diuretics discontinued due to rising creatinine.  Respiratory symptoms and edema improved. -Continue holding Lasix for 1 week.  Patient is high risk for dehydration due to dementia -Reassess fluid status, renal functions and electrolytes in 1 week   CKD-3B: Cr improved after holding diuretics. Recent Labs    08/16/22 0551 08/17/22 0524 08/18/22 [id number] 08/19/22 0449 12/18/22 1553 12/19/22 1757 12/20/22 0231 12/21/22 0217  12/22/22 0146 12/23/22 0706  BUN 46* 47* 42* 39* 48* 46* 40* 59* 73* 70*  CREATININE 1.21* 1.22* 1.25* 1.30* 1.51* 1.57* 1.51* 1.61* 1.49* 1.34*  -Continue holding Lasix for 1 week -Recheck renal function in 1 week at    Dementia without behavioral  disturbance: Fairly oriented for most part. -Reorientation and delirium precaution   Acute urinary tension/frequent urination: Had Foley catheter inserted the night of admission.  Per daughter, about 900 cc urine output after Foley insertion on 3/30.Marland Kitchen  UA with large LE and rare bacteria.  Urine culture negative. -Completed IV ceftriaxone for 4 days. -Continue Foley catheter for a total of 1 week.  Outpatient follow-up with urology if she fails voiding trial -Bethanecol 10 mg 3 times daily    Paroxysmal atrial fibrillation: Currently in sinus rhythm. -continue  on metoprolol and low-dose Eliquis   Goal of care counseling: DNR/DNI. -Palliative to follow outpatient   Hypertension: Normotensive -continue amlodipine and metoprolol   Hyperlipidemia -continue atorvastatin     Hypothyroidism: TSH within normal. -continue synthroid     Anxiety -continue remeron at reduced dose.   GERD, -ppi    History of pulmonary embolism -on Elilquis    Generalized weakness -Continue PT/OT at SNF              Time spent 45 minutes  Vital signs Vitals:   12/22/22 1144 12/22/22 1946 12/23/22 0323 12/23/22 0827  BP: (!) 134/55 (!) 143/65 (!) 164/63 (!) 150/70  Pulse: (!) 57  62   Temp: 98.9 F (37.2 C) (!) 97.5 F (36.4 C) 97.6 F (36.4 C)   Resp: 19  19   Height:      Weight:      SpO2:   95%   TempSrc: Oral Oral Oral   BMI (Calculated):         Discharge exam  GENERAL: No apparent distress.  Nontoxic. HEENT: MMM.  Vision grossly intact.  Hard of hearing. NECK: Supple.  No apparent JVD.  RESP:  No IWOB.  Fair aeration bilaterally. CVS:  RRR. Heart sounds normal.  ABD/GI/GU: BS+. Abd soft, NTND.  Foley catheter in place. MSK/EXT:  Moves extremities. No apparent deformity. No edema.  SKIN: no apparent skin lesion or wound NEURO: Awake and alert. Oriented to self and place but not time.  No apparent focal neuro deficit. PSYCH: Calm. Normal affect.   Discharge  Instructions Discharge Instructions     Diet - low sodium heart healthy   Complete by: As directed    Increase activity slowly   Complete by: As directed       Allergies as of 12/23/2022       Reactions   Ambrosia Trifida (tall Ragweed) Allergy Skin Test Other (See Comments)   Hydrochlorothiazide Other (See Comments)   hyponatremia   Lyrica [pregabalin] Other (See Comments)   Severe confusion   Diltiazem Hcl Er Rash   Short Ragweed Pollen Ext Other (See Comments)        Medication List     TAKE these medications    amLODipine 2.5 MG tablet Commonly known as: NORVASC Take 1 tablet (2.5 mg total) by mouth daily.   apixaban 2.5 MG Tabs tablet Commonly known as: ELIQUIS Take 1 tablet (2.5 mg total) by mouth 2 (two) times daily.   atorvastatin 40 MG tablet Commonly known as: LIPITOR Take 40 mg by mouth daily.   bethanechol 10 MG tablet Commonly known as: URECHOLINE Take 1 tablet (10 mg total) by mouth 3 (  three) times daily.   CALCIUM CITRATE + PO Take 1 tablet 2 (two) times daily by mouth.   CENTRUM SILVER PO Take 1 tablet by mouth daily.   donepezil 10 MG tablet Commonly known as: ARICEPT Take 1 tablet (10 mg total) by mouth at bedtime.   DULoxetine 30 MG capsule Commonly known as: CYMBALTA Take 30 mg by mouth daily. What changed: Another medication with the same name was removed. Continue taking this medication, and follow the directions you see here.   furosemide 20 MG tablet Commonly known as: LASIX Take 1 tablet (20 mg total) by mouth daily. Start taking on: December 30, 2022 What changed: These instructions start on December 30, 2022. If you are unsure what to do until then, ask your doctor or other care provider.   gabapentin 100 MG capsule Commonly known as: NEURONTIN Take 100 mg by mouth at bedtime.   lactose free nutrition Liqd Take 237 mLs by mouth 2 (two) times daily between meals.   levothyroxine 100 MCG tablet Commonly known as: SYNTHROID Take  1 tablet (100 mcg total) by mouth in the morning. DAW1 Synthroid   Metoprolol Tartrate 75 MG Tabs Take 1 tablet (75 mg total) by mouth 2 (two) times daily.   mirtazapine 7.5 MG tablet Commonly known as: REMERON Take 1 tablet (7.5 mg total) by mouth at bedtime. What changed:  medication strength how much to take   omeprazole 40 MG capsule Commonly known as: PRILOSEC Take 1 capsule (40 mg total) by mouth 2 (two) times daily before a meal.   polyvinyl alcohol 1.4 % ophthalmic solution Commonly known as: LIQUIFILM TEARS Place 1 drop into both eyes as needed for dry eyes.   Prolia 60 MG/ML Sosy injection Generic drug: denosumab Inject 60 mg into the skin every 6 (six) months.   TYLENOL EXTRA STRENGTH PO Take 1,000 mg by mouth 3 (three) times daily.        Consultations: Palliative medicine  Procedures/Studies:   ECHOCARDIOGRAM COMPLETE  Result Date: 12/20/2022    ECHOCARDIOGRAM REPORT   Patient Name:   Erin Ryan Date of Exam: 12/20/2022 Medical Rec #:  [id number]     Height:       62.0 in Accession #:    [id number]    Weight:       135.6 lb Date of Birth:  02-Jul-1925      BSA:          1.621 m Patient Age:    89 years      BP:           137/48 mmHg Patient Gender: F             HR:           66 bpm. Exam Location:  Inpatient Procedure: 2D Echo, Cardiac Doppler and Color Doppler Indications:    CHF-Acute Diastolic [id number]  History:        Patient has prior history of Echocardiogram examinations, most                 recent 08/11/2022. Arrythmias:Atrial Fibrillation and                 Tachycardia, Signs/Symptoms:Dyspnea; Risk Factors:Hypertension                 and Dyslipidemia. CKD, stage 3.  Sonographer:    Ronny Flurry Referring Phys: [id number] [place]  1. There is mild chordal SAM in the setting of hyperdynamic LV function,  very mild dynamic LVOT peak gradient of 18 mmHg. Marland Kitchen Left ventricular ejection fraction, by estimation, is 70 to 75%. The left  ventricle has hyperdynamic function. Left ventricular endocardial border not optimally defined to evaluate regional wall motion. There is mild left ventricular hypertrophy. Left ventricular diastolic parameters are consistent with Grade I diastolic dysfunction (impaired relaxation). Elevated left atrial pressure.  2. Right ventricular systolic function is normal. The right ventricular size is normal.  3. The mitral valve is abnormal. Mild mitral valve regurgitation. No evidence of mitral stenosis.  4. The tricuspid valve is abnormal.  5. The aortic valve is tricuspid. Aortic valve regurgitation is mild. No aortic stenosis is present. FINDINGS  Left Ventricle: There is mild chordal SAM in the setting of hyperdynamic LV function, very mild dynamic LVOT peak gradient of 18 mmHg. Left ventricular ejection fraction, by estimation, is 70 to 75%. The left ventricle has hyperdynamic function. Left ventricular endocardial border not optimally defined to evaluate regional wall motion. The left ventricular internal cavity size was normal in size. There is mild left ventricular hypertrophy. Left ventricular diastolic parameters are consistent with Grade I diastolic dysfunction (impaired relaxation). Elevated left atrial pressure. Right Ventricle: Indeterminant PASP, IVC poorly visualized, cannot estimate RA pressure. The right ventricular size is normal. Right vetricular wall thickness was not well visualized. Right ventricular systolic function is normal. Left Atrium: Left atrial size was normal in size. Right Atrium: Right atrial size was normal in size. Pericardium: There is no evidence of pericardial effusion. Mitral Valve: The mitral valve is abnormal. Mild to moderate mitral annular calcification. Mild mitral valve regurgitation. No evidence of mitral valve stenosis. MV peak gradient, 6.5 mmHg. The mean mitral valve gradient is 3.0 mmHg. Tricuspid Valve: The tricuspid valve is abnormal. Tricuspid valve regurgitation is  mild . No evidence of tricuspid stenosis. Aortic Valve: The aortic valve is tricuspid. Aortic valve regurgitation is mild. No aortic stenosis is present. Aortic valve mean gradient measures 4.0 mmHg. Aortic valve peak gradient measures 6.3 mmHg. Aortic valve area, by VTI measures 1.70 cm. Pulmonic Valve: The pulmonic valve was not well visualized. Pulmonic valve regurgitation is not visualized. No evidence of pulmonic stenosis. Aorta: The aortic root and ascending aorta are structurally normal, with no evidence of dilitation. Venous: The inferior vena cava was not well visualized. IAS/Shunts: No atrial level shunt detected by color flow Doppler.  LEFT VENTRICLE PLAX 2D LVIDd:         2.60 cm   Diastology LVIDs:         1.90 cm   LV e' medial:    3.68 cm/s LV PW:         1.10 cm   LV E/e' medial:  28.3 LV IVS:        1.20 cm   LV e' lateral:   7.62 cm/s LVOT diam:     1.90 cm   LV E/e' lateral: 13.6 LV SV:         48 LV SV Index:   30 LVOT Area:     2.84 cm  RIGHT VENTRICLE RV S prime:     10.70 cm/s TAPSE (M-mode): 1.3 cm LEFT ATRIUM             Index        RIGHT ATRIUM           Index LA diam:        2.90 cm 1.79 cm/m   RA Area:     11.20 cm LA  Vol [facility]):   40.3 ml 24.87 ml/m  RA Volume:   22.90 ml  14.13 ml/m LA Vol (A4C):   41.2 ml 25.42 ml/m LA Biplane Vol: 44.0 ml 27.15 ml/m  AORTIC VALVE AV Area (Vmax):    1.79 cm AV Area (Vmean):   1.68 cm AV Area (VTI):     1.70 cm AV Vmax:           125.50 cm/s AV Vmean:          87.450 cm/s AV VTI:            0.283 m AV Peak Grad:      6.3 mmHg AV Mean Grad:      4.0 mmHg LVOT Vmax:         79.17 cm/s LVOT Vmean:        51.833 cm/s LVOT VTI:          0.170 m LVOT/AV VTI ratio: 0.60  AORTA Ao Root diam: 3.20 cm Ao Asc diam:  2.90 cm MITRAL VALVE                TRICUSPID VALVE MV Area (PHT): 3.08 cm     TR Peak grad:   28.3 mmHg MV Peak grad:  6.5 mmHg     TR Vmax:        266.00 cm/s MV Mean grad:  3.0 mmHg MV Vmax:       1.27 m/s     SHUNTS MV Vmean:      75.1  cm/s    Systemic VTI:  0.17 m MV Decel Time: 246 msec     Systemic Diam: 1.90 cm MV E velocity: 104.00 cm/s MV A velocity: 114.00 cm/s MV E/A ratio:  0.91 Carlyle Dolly MD Electronically signed by Carlyle Dolly MD Signature Date/Time: 12/20/2022/1:35:52 PM    Final    DG Chest Port 1 View  Result Date: 12/19/2022 CLINICAL DATA:  Short of breath EXAM: PORTABLE CHEST 1 VIEW COMPARISON:  08/13/2022 FINDINGS: Possible airspace disease at the left base. Right lung grossly clear. Stable cardiomediastinal silhouette with aortic atherosclerosis. No pneumothorax IMPRESSION: Possible airspace disease and effusion at the left base. Electronically Signed   By: Donavan Foil M.D.   On: 12/19/2022 18:30       The results of significant diagnostics from this hospitalization (including imaging, microbiology, ancillary and laboratory) are listed below for reference.     Microbiology: Recent Results (from the past 240 hour(s))  SARS Coronavirus 2 by RT PCR (hospital order, performed in [facility] hospital lab) *cepheid single result test* Anterior Nasal Swab     Status: Abnormal   Collection Time: 12/19/22  5:52 PM   Specimen: Anterior Nasal Swab  Result Value Ref Range Status   SARS Coronavirus 2 by RT PCR POSITIVE (A) NEGATIVE Final    Comment: Performed at [facility], [place] [address] [place], [place] [address]  Urine Culture (for pregnant, neutropenic or urologic patients or patients with an indwelling urinary catheter)     Status: None   Collection Time: 12/20/22 12:03 PM   Specimen: Urine, Catheterized  Result Value Ref Range Status   Specimen Description URINE, CATHETERIZED  Final   Special Requests NONE  Final   Culture   Final    NO GROWTH Performed at [facility], [place] [address] [place], [place] [address]    Report Status 12/22/2022 FINAL  Final     Labs:  CBC: Recent Labs  Lab 12/19/22  1757 12/19/22 1808  WBC 8.4  --   NEUTROABS 6.1  --   HGB 13.3 13.6  HCT 41.1  40.0  MCV 96.0  --   PLT 258  --    BMP &GFR Recent Labs  Lab 12/19/22 1757 12/19/22 1808 12/20/22 0231 12/21/22 0217 12/22/22 0146 12/23/22 0706  NA 137 136 138 139 134* 139  K 4.2 4.1 3.6 3.9 3.7 4.1  CL 100  --  99 103 101 105  CO2 22  --  23 24 22 23   GLUCOSE 129*  --  134* 150* 112* 111*  BUN 46*  --  40* 59* 73* 70*  CREATININE 1.57*  --  1.51* 1.61* 1.49* 1.34*  CALCIUM 8.8*  --  7.9* 8.1* 7.9* 8.0*  MG  --   --   --  1.8 1.9 2.0  PHOS  --   --   --  3.5 4.2 4.3   Estimated Creatinine Clearance: 22 mL/min (A) (by C-G formula based on SCr of 1.34 mg/dL (H)). Liver & Pancreas: Recent Labs  Lab 12/19/22 1757 12/20/22 0231 12/21/22 0217 12/22/22 0146 12/23/22 0706  AST 37 27  --   --   --   ALT 24 24  --   --   --   ALKPHOS 70 60  --   --   --   BILITOT 0.4 0.4  --   --   --   PROT 7.1 6.0*  --   --   --   ALBUMIN 3.1* 2.6* 2.2* 2.1* 2.1*   No results for input(s): "LIPASE", "AMYLASE" in the last 168 hours. No results for input(s): "AMMONIA" in the last 168 hours. Diabetic: No results for input(s): "HGBA1C" in the last 72 hours. No results for input(s): "GLUCAP" in the last 168 hours. Cardiac Enzymes: No results for input(s): "CKTOTAL", "CKMB", "CKMBINDEX", "TROPONINI" in the last 168 hours. No results for input(s): "PROBNP" in the last 8760 hours. Coagulation Profile: No results for input(s): "INR", "PROTIME" in the last 168 hours. Thyroid Function Tests: No results for input(s): "TSH", "T4TOTAL", "FREET4", "T3FREE", "THYROIDAB" in the last 72 hours. Lipid Profile: No results for input(s): "CHOL", "HDL", "LDLCALC", "TRIG", "CHOLHDL", "LDLDIRECT" in the last 72 hours. Anemia Panel: No results for input(s): "VITAMINB12", "FOLATE", "FERRITIN", "TIBC", "IRON", "RETICCTPCT" in the last 72 hours. Urine analysis:    Component Value Date/Time   COLORURINE YELLOW 12/20/2022 0958   APPEARANCEUR HAZY (A) 12/20/2022 0958   LABSPEC 1.014 12/20/2022 0958   PHURINE  5.0 12/20/2022 0958   GLUCOSEU NEGATIVE 12/20/2022 0958   HGBUR NEGATIVE 12/20/2022 0958   BILIRUBINUR NEGATIVE 12/20/2022 0958   KETONESUR NEGATIVE 12/20/2022 0958   PROTEINUR NEGATIVE 12/20/2022 0958   NITRITE NEGATIVE 12/20/2022 0958   LEUKOCYTESUR LARGE (A) 12/20/2022 0958   Sepsis Labs: Invalid input(s): "PROCALCITONIN", "LACTICIDVEN"   SIGNED:  Mercy Riding, MD  [facility] 12/23/2022, 11:18 AM

## 2022-12-23 NOTE — TOC Transition Note (Signed)
Transition of Care [facility]) - CM/SW Discharge Note   Patient Details  Name: Erin Ryan MRN: [id number] Date of Birth: 1925-06-28  [facility] CM/SW Contact:  Bethann Berkshire, [place] Phone Number: 12/23/2022, 1:13 PM   Clinical Narrative:     Patient will DC to: [facility] Anticipated DC date: 12/23/2022 Family notified: [name] Transport by: Corey Harold   Per MD patient ready for DC to [facility]. RN, patient, patient's family, and facility notified of DC. Discharge Summary and FL2 sent to facility. RN to call report prior to discharge [phone]  room 107). DC packet on chart. Ambulance transport requested for patient.   CSW will sign off for now as social work intervention is no longer needed. Please consult [place] again if new needs arise.   Final next level of care: Skilled Nursing Facility Barriers to Discharge: No Barriers Identified   Patient Goals and CMS Choice CMS Medicare.gov Compare Post Acute Care list provided to:: Patient Represenative (must comment) Choice offered to / list presented to : Adult Children  Discharge Placement                Patient chooses bed at: [facility] Patient to be transferred to facility by: [facility] Name of family member notified: Daughter [name] Patient and family notified of of transfer: 12/23/22  Discharge Plan and Services Additional resources added to the After Visit Summary for   In-house Referral: Clinical Social Work   Post Acute Care Choice: [place]                               Social Determinants of Health (SDOH) Interventions SDOH Screenings   Food Insecurity: No Food Insecurity (12/22/2022)  Housing: Low Risk  (12/22/2022)  Transportation Needs: No Transportation Needs (12/22/2022)  Utilities: Not At Risk (12/22/2022)  Tobacco Use: Low Risk  (12/22/2022)     Readmission Risk Interventions     No data to display

## 2022-12-24 DIAGNOSIS — I480 Paroxysmal atrial fibrillation: Secondary | ICD-10-CM | POA: Diagnosis not present

## 2022-12-24 DIAGNOSIS — U071 COVID-19: Secondary | ICD-10-CM | POA: Diagnosis not present

## 2022-12-24 DIAGNOSIS — F03C3 Unspecified dementia, severe, with mood disturbance: Secondary | ICD-10-CM | POA: Diagnosis not present

## 2022-12-24 DIAGNOSIS — I5033 Acute on chronic diastolic (congestive) heart failure: Secondary | ICD-10-CM | POA: Diagnosis not present

## 2022-12-24 DIAGNOSIS — R531 Weakness: Secondary | ICD-10-CM | POA: Diagnosis not present

## 2022-12-24 DIAGNOSIS — R2689 Other abnormalities of gait and mobility: Secondary | ICD-10-CM | POA: Diagnosis not present

## 2022-12-24 DIAGNOSIS — R339 Retention of urine, unspecified: Secondary | ICD-10-CM | POA: Diagnosis not present

## 2022-12-25 LAB — LAB REPORT - SCANNED: EGFR: 42

## 2022-12-29 ENCOUNTER — Telehealth: Payer: Self-pay | Admitting: Cardiology

## 2022-12-29 NOTE — Telephone Encounter (Signed)
Erin Ryan at [facility] rehab is calling to get orders on medication and after results of labs. Requesting return call.

## 2022-12-29 NOTE — Telephone Encounter (Signed)
Pt was in the ER then admitted and d/c off lasix  to [facility] off for 1 week then Take 1 tablet (20 mg total) by mouth daily starting 4-9. Per Dr Antoine Poche he would like pt to take every-other-day per note. I have scheduled TOC appt 4-18 at 1055am. Called [facility] and left detailed message of this appt and to bring her early.

## 2022-12-29 NOTE — Telephone Encounter (Signed)
Returned call to [place] at [place] she states that she received a call from Daughter stating that she was to take Lasix every-other-day from Dr [employer] office. (I did tell her that but informed that I could not tell if this was before or after hospitalization or after) Erin Ryan states that pt has had lab done since discharge. She will fax [place] results for MD to review and let us know dosing. Verbalized understanding. Will await labs for MD to review and possible dosage change. Will forward new orders to [facility] when received.

## 2022-12-30 NOTE — Telephone Encounter (Signed)
Spoke with Joni Reining. She states patient is now on Lasix 20 mg Daily.  She sent recent labs.  Patient will remain on this dose unless changes made by our provider.

## 2022-12-30 NOTE — Telephone Encounter (Signed)
Erin Ryan from [facility] calling for an update. She states she has started pt on 25mg  daily for now

## 2022-12-31 DIAGNOSIS — F03B4 Unspecified dementia, moderate, with anxiety: Secondary | ICD-10-CM | POA: Diagnosis not present

## 2022-12-31 DIAGNOSIS — F32A Depression, unspecified: Secondary | ICD-10-CM | POA: Diagnosis not present

## 2022-12-31 NOTE — Telephone Encounter (Signed)
Hey Dr.Hochrein,  Patient has an order for Lasix 20mg  once daily with 3 refills. Written by Hanley Hays MD for patient to start on April 9th. There are no other orders for lasix. Would you like to cancel this order or wait until patient needs refill.

## 2023-01-06 ENCOUNTER — Telehealth: Payer: Self-pay | Admitting: Adult Health

## 2023-01-06 NOTE — Telephone Encounter (Signed)
Pt daughter called. Stated she need to talk to nurse about pt dementia. Stated pt is in rehab right now and they are trying to release pt Saturday. Stated pt needs a appointment so Erin Ryan can recommend memory care.

## 2023-01-07 NOTE — Telephone Encounter (Addendum)
Daughter called again and is needing to speak to Rn or Provider. She states pt is being combative and verbally abuse. Daughter states that the pt's PCP advised 24hr care and daughter is needing to be advised.

## 2023-01-07 NOTE — Progress Notes (Deleted)
Cardiology Office Note:    Date:  01/07/2023   ID:  Erin Ryan, DOB 07-26-1925, MRN [id number]  PCP:  [name], MD   [facility] Providers Cardiologist:  Rollene Rotunda, MD { Click to update primary MD,subspecialty MD or APP then REFRESH:1}    Referring MD: [name], MD   No chief complaint on file. ***  History of Present Illness:    Erin Ryan is a 89 y.o. female with a hx of dementia (pt does not know), COPD, CKD stage IIIb, hypertension, hypothyroidism, anxiety, chronic diastolic heart failure, A-fib on Eliquis, and GERD.  She is very hard of hearing and is generally accompanied by her her daughter to most appointments.  She has a history of preserved EF and grade 2 diastolic dysfunction.  She has had issues with shortness of breath in the past.  She has been treated for diastolic heart failure and evaluated by pulmonology for possible COPD  She historically maintains her O2 saturations and her shortness of breath was felt related to GERD.  She had cough with ARB She had edema with amlodipine She had fatigue with beta-blocker  She had A-fib with RVR in the setting of CHF exacerbation, small segmental pulmonary embolism, and UTI November 2023.  She was treated with amiodarone and Eliquis.  Initial plan was to take Eliquis for 6 months for PE and then discontinue given her age and frailty.  She was seen in the clinic with Dr. Royann Shivers 11/28/2022 with lower extremity edema and 5 pound weight increase.  She has recently had issues with balancing dehydration and hypovolemia.  20 mg of Lasix noted to be too much for her but every other day dosing was insufficient.  Given fluctuating renal function, Dr. Royann Shivers opted to put her on 20 mg of Lasix 5 days a week with the option to take Lasix on the "skip" days if she has edema or weight above 128 pounds.  Unfortunately she subsequently presented to the ER via EMS for shortness of breath on exertion and weakness on  12/19/22. She was hospitalized and treated for COVID-19 infection with CXR suspicious for pneumonia.  She was treated for possible UTI as well.  IV diuresis complicated by rising creatinine.  Echocardiogram 12/20/2022 confirmed hyperdynamic EF, mild chordal SAM, mild dynamic LVOT peak gradient of 18 mmHg.  She was discharged with instructions to hold Lasix for 1 week.  Discharge serum creatinine was 1.34.  Per epic chart notes, it sounds as though the patient has had worsening confusion since being at [facility].  She presents today for routine hospital follow-up.      Chronic diastolic heart failure She has a very narrow window for a Lasix regimen that balance is dehydration with hypervolemia She is currently taking 20 mg Lasix____________   CKD stage IIIb Discharge creatinine was 1.34 on 12/23/2022   Hypertension She is on 2.5 mg amlodipine, several milligrams Lopressor twice daily,  PAF She appears to be on 75 mg metoprolol tartrate twice daily ?  Is she on amiodarone   History of PE 07/2022 Chronic anticoagulation Has been maintained on 2.5 mg Eliquis twice daily Would stop this in approximately 1 month after 6 months of therapy    Past Medical History:  Diagnosis Date   A-fib    Anxiety    Arthritis    COVID-19    04/2021   GERD (gastroesophageal reflux disease)    Hearing loss    has hearing aids   High cholesterol  Hypertension    Hypothyroid    Osteoporosis    Scoliosis    Thyroid disease     Past Surgical History:  Procedure Laterality Date   CATARACT EXTRACTION     cysts removed from bilateral breasts     THYROIDECTOMY      Current Medications: No outpatient medications have been marked as taking for the 01/08/23 encounter (Appointment) with Marcelino Duster, PA.     Allergies:   Ambrosia trifida (tall ragweed) allergy skin test, Hydrochlorothiazide, Lyrica [pregabalin], Diltiazem hcl er, and Short ragweed pollen ext   Social History    Socioeconomic History   Marital status: Widowed    Spouse name: Not on file   Number of children: 1   Years of education: Not on file   Highest education level: Not on file  Occupational History   Occupation: Retired    Associate Professor: RETIRED    Comment: Metallurgist  Tobacco Use   Smoking status: Never   Smokeless tobacco: Never  Vaping Use   Vaping Use: Never used  Substance and Sexual Activity   Alcohol use: No   Drug use: No   Sexual activity: Not on file  Other Topics Concern   Not on file  Social History Narrative   Lives with daughter.    Has 1 daughter, Erin Ryan, and 2 grandkids   Social Determinants of Health   Financial Resource Strain: Not on file  Food Insecurity: No Food Insecurity (12/22/2022)   Hunger Vital Sign    Worried About Running Out of Food in the Last Year: Never true    Ran Out of Food in the Last Year: Never true  Transportation Needs: No Transportation Needs (12/22/2022)   PRAPARE - Administrator, Civil Service (Medical): No    Lack of Transportation (Non-Medical): No  Physical Activity: Not on file  Stress: Not on file  Social Connections: Not on file     Family History: The patient's ***family history includes Asthma in her father; Bladder Cancer in her mother; Heart attack in her brother; Heart disease in her father; Heart disease (age of onset: 59) in her mother.  ROS:   Please see the history of present illness.    *** All other systems reviewed and are negative.  EKGs/Labs/Other Studies Reviewed:    The following studies were reviewed today:  Echo 12/20/22  1. There is mild chordal SAM in the setting of hyperdynamic LV function,  very mild dynamic LVOT peak gradient of 18 mmHg. Marland Kitchen Left ventricular  ejection fraction, by estimation, is 70 to 75%. The left ventricle has  hyperdynamic function. Left ventricular  endocardial border not optimally defined to evaluate regional wall motion.  There is mild left ventricular  hypertrophy. Left ventricular diastolic  parameters are consistent with Grade I diastolic dysfunction (impaired  relaxation). Elevated left atrial  pressure.   2. Right ventricular systolic function is normal. The right ventricular  size is normal.   3. The mitral valve is abnormal. Mild mitral valve regurgitation. No  evidence of mitral stenosis.   4. The tricuspid valve is abnormal.   5. The aortic valve is tricuspid. Aortic valve regurgitation is mild. No  aortic stenosis is present.   EKG:  EKG is *** ordered today.  The ekg ordered today demonstrates ***  Recent Labs: 12/19/2022: B Natriuretic Peptide 321.4; Hemoglobin 13.6; Platelets 258 12/20/2022: ALT 24; TSH 0.696 12/23/2022: BUN 70; Creatinine, Ser 1.34; Magnesium 2.0; Potassium 4.1; Sodium 139  Recent Lipid  Panel No results found for: "CHOL", "TRIG", "HDL", "CHOLHDL", "VLDL", "LDLCALC", "LDLDIRECT"   Risk Assessment/Calculations:   {Does this patient have ATRIAL FIBRILLATION?:[id number]}  No BP recorded.  {Refresh Note OR Click here to enter BP  :1}***         Physical Exam:    VS:  There were no vitals taken for this visit.    Wt Readings from Last 3 Encounters:  12/22/22 140 lb (63.5 kg)  12/11/22 134 lb 12.8 oz (61.1 kg)  11/28/22 130 lb 12.8 oz (59.3 kg)     GEN: *** Well nourished, well developed in no acute distress HEENT: Normal NECK: No JVD; No carotid bruits LYMPHATICS: No lymphadenopathy CARDIAC: ***RRR, no murmurs, rubs, gallops RESPIRATORY:  Clear to auscultation without rales, wheezing or rhonchi  ABDOMEN: Soft, non-tender, non-distended MUSCULOSKELETAL:  No edema; No deformity  SKIN: Warm and dry NEUROLOGIC:  Alert and oriented x 3 PSYCHIATRIC:  Normal affect   ASSESSMENT:    No diagnosis found. PLAN:    In order of problems listed above:  ***      {Are you ordering a CV Procedure (e.g. stress test, cath, DCCV, TEE, etc)?   Press F2        :[id number]}    Medication Adjustments/Labs  and Tests Ordered: Current medicines are reviewed at length with the patient today.  Concerns regarding medicines are outlined above.  No orders of the defined types were placed in this encounter.  No orders of the defined types were placed in this encounter.   There are no Patient Instructions on file for this visit.   Signed, Marcelino Duster, [place]  01/07/2023 2:32 PM    [facility]

## 2023-01-07 NOTE — Telephone Encounter (Signed)
Spoke with daughter (checked DPR ) Daughter states Pt  is currently in rehab Pt has had Covid in hospital for 10 days Pt does have Moderate Dementia Daughter states pt has  increased confusion and agitation. Daughter states Facility did take urine specimen waiting on results. Daughter states rehab recommendation is for patient to go to memory care unit Daughter really doesn't want her to go to memory care unit she wants her to come back home with her . Daughter wanted to know if  she  brought pt home will pt go back to her baseline before covid Per Aundra Millet ,NP  its not a guarantee that patient will return to baseline  when coming home and if she did bring pt home her returning back to her baseline its not instant it can take some time she recommends for daughter to go with referral for rehab for patient to go  to memory care unit,however if daughter wants to bring patient home she has to have 24 hour care if daughter can't provide 24 hour care pt needs to go to memory care unit .Daughter wanted to know if she decides to bring patient home and it  doesn't work out can [employer] write a   Letter to recommend pt to go to memory care Per Aundra Millet ,NP yes she will write the letter however her first recommendation is for patient to go to memory care unit per rehab team at facility Daughter expressed understanding and thanked me for calling her back

## 2023-01-07 NOTE — Telephone Encounter (Signed)
Spoke with daughter asking me the same questions has earlier. Daughter really wants Provider to make decision  about her mom for her Made Daughter aware that its her decision about pt next steps after rehab  Pt expressed understanding . Per Aundra Millet ,NP if daughter calls back please make pt sooner appointment to be evaluated

## 2023-01-07 NOTE — Telephone Encounter (Signed)
Pt daughter called back. Requesting a call back from nurse.

## 2023-01-08 ENCOUNTER — Ambulatory Visit: Payer: Medicare Other | Admitting: Physician Assistant

## 2023-01-12 ENCOUNTER — Encounter: Payer: Self-pay | Admitting: Nurse Practitioner

## 2023-01-12 ENCOUNTER — Ambulatory Visit: Payer: Medicare Other | Attending: Physician Assistant | Admitting: Nurse Practitioner

## 2023-01-12 VITALS — BP 118/64 | HR 63 | Ht 61.0 in

## 2023-01-12 DIAGNOSIS — Z86711 Personal history of pulmonary embolism: Secondary | ICD-10-CM | POA: Insufficient documentation

## 2023-01-12 DIAGNOSIS — I480 Paroxysmal atrial fibrillation: Secondary | ICD-10-CM | POA: Diagnosis not present

## 2023-01-12 DIAGNOSIS — N1832 Chronic kidney disease, stage 3b: Secondary | ICD-10-CM | POA: Diagnosis not present

## 2023-01-12 DIAGNOSIS — E782 Mixed hyperlipidemia: Secondary | ICD-10-CM | POA: Insufficient documentation

## 2023-01-12 DIAGNOSIS — E039 Hypothyroidism, unspecified: Secondary | ICD-10-CM | POA: Insufficient documentation

## 2023-01-12 DIAGNOSIS — R413 Other amnesia: Secondary | ICD-10-CM | POA: Insufficient documentation

## 2023-01-12 DIAGNOSIS — I5032 Chronic diastolic (congestive) heart failure: Secondary | ICD-10-CM | POA: Insufficient documentation

## 2023-01-12 DIAGNOSIS — I10 Essential (primary) hypertension: Secondary | ICD-10-CM | POA: Diagnosis not present

## 2023-01-12 NOTE — Patient Instructions (Signed)
Medication Instructions:  Your physician recommends that you continue on your current medications as directed. Please refer to the Current Medication list given to you today.  *If you need a refill on your cardiac medications before your next appointment, please call your pharmacy*   Lab Work: Your physician recommends that you complete lab work today. BMET & BNP  If you have labs (blood work) drawn today and your tests are completely normal, you will receive your results only by: MyChart Message (if you have MyChart) OR A paper copy in the mail If you have any lab test that is abnormal or we need to change your treatment, we will call you to review the results.   Testing/Procedures: NONE ordered at this time of appointment     Follow-Up: At [facility], you and your health needs are our priority.  As part of our continuing mission to provide you with exceptional heart care, we have created designated Provider Care Teams.  These Care Teams include your primary Cardiologist (physician) and Advanced Practice Providers (APPs -  Physician Assistants and Nurse Practitioners) who all work together to provide you with the care you need, when you need it.  We recommend signing up for the patient portal called "MyChart".  Sign up information is provided on this After Visit Summary.  MyChart is used to connect with patients for Virtual Visits (Telemedicine).  Patients are able to view lab/test results, encounter notes, upcoming appointments, etc.  Non-urgent messages can be sent to your provider as well.   To learn more about what you can do with MyChart, go to [email or web address].    Your next appointment:    Keep follow up   Provider:   Rollene Rotunda, MD     Other Instructions

## 2023-01-12 NOTE — Progress Notes (Unsigned)
Office Visit    [name] Date of Encounter: 01/12/2023  Primary Care Provider:  [name], MD Primary Cardiologist:  Rollene Rotunda, MD  Chief Complaint    89 year old female with a history of paroxysmal atrial fibrillation, chronic diastolic heart failure, PE, hypertension, hyperlipidemia, CKD stage IIIa, hypothyroidism, COPD, and GERD who presents for hospital follow-up related to heart failure.   Past Medical History    Past Medical History:  Diagnosis Date   A-fib    Anxiety    Arthritis    COVID-19    04/2021   GERD (gastroesophageal reflux disease)    Hearing loss    has hearing aids   High cholesterol    Hypertension    Hypothyroid    Osteoporosis    Scoliosis    Thyroid disease    Past Surgical History:  Procedure Laterality Date   CATARACT EXTRACTION     cysts removed from bilateral breasts     THYROIDECTOMY      Allergies  Allergies  Allergen Reactions   Ambrosia Trifida (Tall Ragweed) Allergy Skin Test Other (See Comments)   Hydrochlorothiazide Other (See Comments)    hyponatremia   Lyrica [Pregabalin] Other (See Comments)    Severe confusion   Diltiazem Hcl Er Rash   Short Ragweed Pollen Ext Other (See Comments)     Labs/Other Studies Reviewed    The following studies were reviewed today: Echo 11/2022: IMPRESSIONS     1. There is mild chordal SAM in the setting of hyperdynamic LV function,  very mild dynamic LVOT peak gradient of 18 mmHg. Marland Kitchen Left ventricular  ejection fraction, by estimation, is 70 to 75%. The left ventricle has  hyperdynamic function. Left ventricular  endocardial border not optimally defined to evaluate regional wall motion.  There is mild left ventricular hypertrophy. Left ventricular diastolic  parameters are consistent with Grade I diastolic dysfunction (impaired  relaxation). Elevated left atrial  pressure.   2. Right ventricular systolic function is normal. The right ventricular  size is  normal.   3. The mitral valve is abnormal. Mild mitral valve regurgitation. No  evidence of mitral stenosis.   4. The tricuspid valve is abnormal.   5. The aortic valve is tricuspid. Aortic valve regurgitation is mild. No  aortic stenosis is present.   Recent Labs: 12/19/2022: B Natriuretic Peptide 321.4; Hemoglobin 13.6; Platelets 258 12/20/2022: ALT 24; TSH 0.696 12/23/2022: BUN 70; Creatinine, Ser 1.34; Magnesium 2.0; Potassium 4.1; Sodium 139  Recent Lipid Panel No results found for: "CHOL", "TRIG", "HDL", "CHOLHDL", "VLDL", "LDLCALC", "LDLDIRECT"  History of Present Illness    89 year old female with the above past medical history including paroxysmal atrial fibrillation, chronic diastolic heart failure, PE, hypertension, hyperlipidemia, CKD stage IIIa, hypothyroidism, COPD, and GERD.   She was hospitalized in November 2023 in the setting of heart failure exacerbation, small segmental PE, atrial fibrillation with RVR, and UTI.  Echocardiogram at that time showed EF 55 to 60%, mild LVH, G2 DD, mildly elevated PASP, moderate AI.  CT chest chest showed acute PE with small thrombus burden in segmental branch in the lower lobe.  She was started on amiodarone and Eliquis.  It was noted she would likely discontinue Eliquis after 6 months given patient's dementia and advanced age.  She was last in the office on 11/28/2022 in the setting of increased lower extremity edema, weight gain.  Lasix was increased to 20 mg daily.  Amiodarone was discontinued.  She remained on Eliquis with plans  to discontinue in May 2024.  She was hospitalized from 12/19/2022 to 12/23/2022 in the setting of COVID-19, pneumonia, acute on chronic diastolic heart failure.  Repeat echocardiogram showed EF 70 to 75%, G1 DD.  Diuretics were held due to worsening renal function.  Palliative care was consulted with plans to follow as outpatient.   She presents today for follow-up accompanied by her daughter.  Since her hospitalization she  has been stable overall from a cardiac standpoint.  She did notice an increase in lower extremity edema when not taking Lasix.  This has improved only slightly with resumption of Lasix 20 mg daily.  She continues to have 2+ pitting bilateral ankle edema. She denies chest pain, palpitations, dyspnea, PND, orthopnea.   She is now home from rehab and living with her daughter.  Her daughter verbalizes caregiver strain.   Home Medications    Current Outpatient Medications  Medication Sig Dispense Refill   Acetaminophen (TYLENOL EXTRA STRENGTH PO) Take 1,000 mg by mouth 3 (three) times daily.     amLODipine (NORVASC) 2.5 MG tablet Take 1 tablet (2.5 mg total) by mouth daily. 90 tablet 3   apixaban (ELIQUIS) 2.5 MG TABS tablet Take 1 tablet (2.5 mg total) by mouth 2 (two) times daily. 180 tablet 3   atorvastatin (LIPITOR) 40 MG tablet Take 40 mg by mouth daily.     bethanechol (URECHOLINE) 10 MG tablet Take 1 tablet (10 mg total) by mouth 3 (three) times daily.     Calcium Citrate-Vitamin D (CALCIUM CITRATE + PO) Take 1 tablet 2 (two) times daily by mouth.     denosumab (PROLIA) 60 MG/ML SOSY injection Inject 60 mg into the skin every 6 (six) months.     donepezil (ARICEPT) 10 MG tablet Take 1 tablet (10 mg total) by mouth at bedtime. 90 tablet 3   DULoxetine (CYMBALTA) 30 MG capsule Take 30 mg by mouth daily.     furosemide (LASIX) 20 MG tablet Take 1 tablet (20 mg total) by mouth daily. 90 tablet 3   gabapentin (NEURONTIN) 100 MG capsule Take 100 mg by mouth at bedtime.     lactose free nutrition (BOOST PLUS) LIQD Take 237 mLs by mouth 2 (two) times daily between meals.  0   levothyroxine (SYNTHROID) 100 MCG tablet Take 1 tablet (100 mcg total) by mouth in the morning. [id number] Synthroid 30 tablet 1   Metoprolol Tartrate 75 MG TABS Take 1 tablet (75 mg total) by mouth 2 (two) times daily. 180 tablet 6   mirtazapine (REMERON) 7.5 MG tablet Take 1 tablet (7.5 mg total) by mouth at bedtime.     Multiple  Vitamins-Minerals (CENTRUM SILVER PO) Take 1 tablet by mouth daily.     nitrofurantoin, macrocrystal-monohydrate, (MACROBID) 100 MG capsule Take 100 mg by mouth 2 (two) times daily.     omeprazole (PRILOSEC) 40 MG capsule Take 1 capsule (40 mg total) by mouth 2 (two) times daily before a meal. 180 capsule 3   polyvinyl alcohol (LIQUIFILM TEARS) 1.4 % ophthalmic solution Place 1 drop into both eyes as needed for dry eyes. 15 mL 0   No current facility-administered medications for this visit.     Review of Systems    She denies chest pain, palpitations, dyspnea, pnd, orthopnea, n, v, dizziness, syncope, edema, weight gain, or early satiety. All other systems reviewed and are otherwise negative except as noted above.   Physical Exam    VS:  BP 118/64   Pulse 63  Ht 5\' 1"  (1.549 m)   SpO2 94%   BMI 26.45 kg/m  GEN: Well nourished, well developed, in no acute distress. HEENT: normal. Neck: Supple, no JVD, carotid bruits, or masses. Cardiac: RRR, no murmurs, rubs, or gallops. No clubbing, cyanosis, 2+ pitting bilatearl ankle edema.  Radials/DP/PT 2+ and equal bilaterally.  Respiratory:  Respirations regular and unlabored, clear to auscultation bilaterally. GI: Soft, nontender, nondistended, BS + x 4. MS: no deformity or atrophy. Skin: warm and dry, no rash. Neuro:  Strength and sensation are intact. Psych: Normal affect.  Accessory Clinical Findings    ECG personally reviewed by me today -NSR, 63 bpm- no acute changes.   Lab Results  Component Value Date   WBC 8.4 12/19/2022   HGB 13.6 12/19/2022   HCT 40.0 12/19/2022   MCV 96.0 12/19/2022   PLT 258 12/19/2022   Lab Results  Component Value Date   CREATININE 1.34 (H) 12/23/2022   BUN 70 (H) 12/23/2022   NA 139 12/23/2022   K 4.1 12/23/2022   CL 105 12/23/2022   CO2 23 12/23/2022   Lab Results  Component Value Date   ALT 24 12/20/2022   AST 27 12/20/2022   ALKPHOS 60 12/20/2022   BILITOT 0.4 12/20/2022   No  results found for: "CHOL", "HDL", "LDLCALC", "LDLDIRECT", "TRIG", "CHOLHDL"  No results found for: "HGBA1C"  Assessment & Plan    1. Chronic diastolic heart failure: Most recent echo in 11/2022 showed EF 70 to 75%, G1 DD. Lasix was held temporarily in the setting of AKI on CKD, now with 2+ bilateral ankle edema, only slightly improved with Lasix 20 mg daily. Will check BNP, BMET today.  If renal function stable, will increase Lasix to 40 mg daily with plans to repeat BMET in 1 week. Plan for close follow-up.    2. Paroxysmal atrial fibrillation: Maintaining NSR. Will defer discontinuation of Eliquis to Dr. Antoine Poche. For now, continue metoprolol, Eliquis.   3. History of PE: Tentative plan to discontinue Eliquis in May 2024.   4. Hypertension: BP well controlled. Continue current antihypertensive regimen.   5. Hyperlipidemia: LDL was 111 in 06/2022. Continue Lipitor.   6. CKD stage IIIa: Creatinine was 1.34 on 12/23/2022.   7. Hypothyroidism: TSH was 0.696 in 11/2022.   8. Dementia: Progressive, follows with neurology.   9. Goals of care: Palliative care was consulted during recent hospitalization.  Discussed palliative care, however, patient's daughter states she is "not interested in hospice."  Explained the role of palliative care, that it is not the same as hospice but can help facilitate transition to hospice care if necessary.  Patient's daughter states she will consider this.  10. Disposition: Follow-up as scheduled with Dr. Antoine Poche in 01/2023.       Joylene Grapes, NP 01/12/2023, 3:12 PM

## 2023-01-13 DIAGNOSIS — R350 Frequency of micturition: Secondary | ICD-10-CM | POA: Diagnosis not present

## 2023-01-13 DIAGNOSIS — U071 COVID-19: Secondary | ICD-10-CM | POA: Diagnosis not present

## 2023-01-13 DIAGNOSIS — K77 Liver disorders in diseases classified elsewhere: Secondary | ICD-10-CM | POA: Diagnosis not present

## 2023-01-13 DIAGNOSIS — E039 Hypothyroidism, unspecified: Secondary | ICD-10-CM | POA: Diagnosis not present

## 2023-01-13 DIAGNOSIS — F32A Depression, unspecified: Secondary | ICD-10-CM | POA: Diagnosis not present

## 2023-01-13 DIAGNOSIS — E7800 Pure hypercholesterolemia, unspecified: Secondary | ICD-10-CM | POA: Diagnosis not present

## 2023-01-13 DIAGNOSIS — F02C3 Dementia in other diseases classified elsewhere, severe, with mood disturbance: Secondary | ICD-10-CM | POA: Diagnosis not present

## 2023-01-13 DIAGNOSIS — F02C4 Dementia in other diseases classified elsewhere, severe, with anxiety: Secondary | ICD-10-CM | POA: Diagnosis not present

## 2023-01-13 DIAGNOSIS — R339 Retention of urine, unspecified: Secondary | ICD-10-CM | POA: Diagnosis not present

## 2023-01-13 DIAGNOSIS — I5033 Acute on chronic diastolic (congestive) heart failure: Secondary | ICD-10-CM | POA: Diagnosis not present

## 2023-01-13 DIAGNOSIS — K219 Gastro-esophageal reflux disease without esophagitis: Secondary | ICD-10-CM | POA: Diagnosis not present

## 2023-01-13 DIAGNOSIS — M419 Scoliosis, unspecified: Secondary | ICD-10-CM | POA: Diagnosis not present

## 2023-01-13 DIAGNOSIS — I480 Paroxysmal atrial fibrillation: Secondary | ICD-10-CM | POA: Diagnosis not present

## 2023-01-13 DIAGNOSIS — N390 Urinary tract infection, site not specified: Secondary | ICD-10-CM | POA: Diagnosis not present

## 2023-01-13 DIAGNOSIS — M1990 Unspecified osteoarthritis, unspecified site: Secondary | ICD-10-CM | POA: Diagnosis not present

## 2023-01-13 DIAGNOSIS — N1832 Chronic kidney disease, stage 3b: Secondary | ICD-10-CM | POA: Diagnosis not present

## 2023-01-13 DIAGNOSIS — Z960 Presence of urogenital implants: Secondary | ICD-10-CM | POA: Diagnosis not present

## 2023-01-13 DIAGNOSIS — Z974 Presence of external hearing-aid: Secondary | ICD-10-CM | POA: Diagnosis not present

## 2023-01-13 DIAGNOSIS — J1282 Pneumonia due to coronavirus disease 2019: Secondary | ICD-10-CM | POA: Diagnosis not present

## 2023-01-13 DIAGNOSIS — M810 Age-related osteoporosis without current pathological fracture: Secondary | ICD-10-CM | POA: Diagnosis not present

## 2023-01-13 DIAGNOSIS — Z86711 Personal history of pulmonary embolism: Secondary | ICD-10-CM | POA: Diagnosis not present

## 2023-01-13 DIAGNOSIS — Z7901 Long term (current) use of anticoagulants: Secondary | ICD-10-CM | POA: Diagnosis not present

## 2023-01-13 DIAGNOSIS — N179 Acute kidney failure, unspecified: Secondary | ICD-10-CM | POA: Diagnosis not present

## 2023-01-13 DIAGNOSIS — H9190 Unspecified hearing loss, unspecified ear: Secondary | ICD-10-CM | POA: Diagnosis not present

## 2023-01-13 DIAGNOSIS — I130 Hypertensive heart and chronic kidney disease with heart failure and stage 1 through stage 4 chronic kidney disease, or unspecified chronic kidney disease: Secondary | ICD-10-CM | POA: Diagnosis not present

## 2023-01-13 LAB — BASIC METABOLIC PANEL
BUN/Creatinine Ratio: 27 (ref 12–28)
BUN: 40 mg/dL — ABNORMAL HIGH (ref 10–36)
CO2: 23 mmol/L (ref 20–29)
Calcium: 9.5 mg/dL (ref 8.7–10.3)
Chloride: 104 mmol/L (ref 96–106)
Creatinine, Ser: 1.47 mg/dL — ABNORMAL HIGH (ref 0.57–1.00)
Glucose: 93 mg/dL (ref 70–99)
Potassium: 5.4 mmol/L — ABNORMAL HIGH (ref 3.5–5.2)
Sodium: 142 mmol/L (ref 134–144)
eGFR: 33 mL/min/{1.73_m2} — ABNORMAL LOW (ref >59)

## 2023-01-13 LAB — BRAIN NATRIURETIC PEPTIDE: BNP: 232.8 pg/mL — ABNORMAL HIGH (ref 0.0–100.0)

## 2023-01-14 ENCOUNTER — Encounter: Payer: Self-pay | Admitting: Nurse Practitioner

## 2023-01-14 ENCOUNTER — Other Ambulatory Visit: Payer: Self-pay

## 2023-01-14 ENCOUNTER — Telehealth: Payer: Self-pay

## 2023-01-14 DIAGNOSIS — I10 Essential (primary) hypertension: Secondary | ICD-10-CM

## 2023-01-14 DIAGNOSIS — I480 Paroxysmal atrial fibrillation: Secondary | ICD-10-CM | POA: Diagnosis not present

## 2023-01-14 DIAGNOSIS — R54 Age-related physical debility: Secondary | ICD-10-CM | POA: Diagnosis not present

## 2023-01-14 DIAGNOSIS — Z79899 Other long term (current) drug therapy: Secondary | ICD-10-CM

## 2023-01-14 DIAGNOSIS — Z0000 Encounter for general adult medical examination without abnormal findings: Secondary | ICD-10-CM | POA: Diagnosis not present

## 2023-01-14 DIAGNOSIS — K219 Gastro-esophageal reflux disease without esophagitis: Secondary | ICD-10-CM | POA: Diagnosis not present

## 2023-01-14 DIAGNOSIS — G301 Alzheimer's disease with late onset: Secondary | ICD-10-CM | POA: Diagnosis not present

## 2023-01-14 DIAGNOSIS — D6869 Other thrombophilia: Secondary | ICD-10-CM | POA: Diagnosis not present

## 2023-01-14 DIAGNOSIS — I129 Hypertensive chronic kidney disease with stage 1 through stage 4 chronic kidney disease, or unspecified chronic kidney disease: Secondary | ICD-10-CM | POA: Diagnosis not present

## 2023-01-14 DIAGNOSIS — F411 Generalized anxiety disorder: Secondary | ICD-10-CM | POA: Diagnosis not present

## 2023-01-14 DIAGNOSIS — E039 Hypothyroidism, unspecified: Secondary | ICD-10-CM | POA: Diagnosis not present

## 2023-01-14 DIAGNOSIS — M810 Age-related osteoporosis without current pathological fracture: Secondary | ICD-10-CM | POA: Diagnosis not present

## 2023-01-14 DIAGNOSIS — N1831 Chronic kidney disease, stage 3a: Secondary | ICD-10-CM | POA: Diagnosis not present

## 2023-01-14 DIAGNOSIS — E785 Hyperlipidemia, unspecified: Secondary | ICD-10-CM | POA: Diagnosis not present

## 2023-01-14 DIAGNOSIS — I5032 Chronic diastolic (congestive) heart failure: Secondary | ICD-10-CM | POA: Diagnosis not present

## 2023-01-14 NOTE — Telephone Encounter (Signed)
Spoke with pts daughter. She was notified of pts lab results. Pt will increase Lasix to 40 mg daily as directed, repeat BMET in 1 week and f/u as planned.

## 2023-01-19 DIAGNOSIS — N1832 Chronic kidney disease, stage 3b: Secondary | ICD-10-CM | POA: Diagnosis not present

## 2023-01-19 DIAGNOSIS — I5033 Acute on chronic diastolic (congestive) heart failure: Secondary | ICD-10-CM | POA: Diagnosis not present

## 2023-01-19 DIAGNOSIS — J1282 Pneumonia due to coronavirus disease 2019: Secondary | ICD-10-CM | POA: Diagnosis not present

## 2023-01-19 DIAGNOSIS — N390 Urinary tract infection, site not specified: Secondary | ICD-10-CM | POA: Diagnosis not present

## 2023-01-19 DIAGNOSIS — I130 Hypertensive heart and chronic kidney disease with heart failure and stage 1 through stage 4 chronic kidney disease, or unspecified chronic kidney disease: Secondary | ICD-10-CM | POA: Diagnosis not present

## 2023-01-19 DIAGNOSIS — U071 COVID-19: Secondary | ICD-10-CM | POA: Diagnosis not present

## 2023-01-20 ENCOUNTER — Other Ambulatory Visit: Payer: Self-pay

## 2023-01-20 ENCOUNTER — Encounter: Payer: Self-pay | Admitting: *Deleted

## 2023-01-20 DIAGNOSIS — R609 Edema, unspecified: Secondary | ICD-10-CM

## 2023-01-20 DIAGNOSIS — I5032 Chronic diastolic (congestive) heart failure: Secondary | ICD-10-CM

## 2023-01-21 LAB — BRAIN NATRIURETIC PEPTIDE: BNP: 199.5 pg/mL — ABNORMAL HIGH (ref 0.0–100.0)

## 2023-01-21 LAB — BASIC METABOLIC PANEL
BUN/Creatinine Ratio: 26 (ref 12–28)
BUN: 38 mg/dL — ABNORMAL HIGH (ref 10–36)
CO2: 23 mmol/L (ref 20–29)
Calcium: 8.9 mg/dL (ref 8.7–10.3)
Chloride: 100 mmol/L (ref 96–106)
Creatinine, Ser: 1.44 mg/dL — ABNORMAL HIGH (ref 0.57–1.00)
Glucose: 74 mg/dL (ref 70–99)
Potassium: 5.2 mmol/L (ref 3.5–5.2)
Sodium: 142 mmol/L (ref 134–144)
eGFR: 33 mL/min/{1.73_m2} — ABNORMAL LOW (ref >59)

## 2023-01-22 DIAGNOSIS — I5033 Acute on chronic diastolic (congestive) heart failure: Secondary | ICD-10-CM | POA: Diagnosis not present

## 2023-01-22 DIAGNOSIS — N1832 Chronic kidney disease, stage 3b: Secondary | ICD-10-CM | POA: Diagnosis not present

## 2023-01-22 DIAGNOSIS — U071 COVID-19: Secondary | ICD-10-CM | POA: Diagnosis not present

## 2023-01-22 DIAGNOSIS — N390 Urinary tract infection, site not specified: Secondary | ICD-10-CM | POA: Diagnosis not present

## 2023-01-22 DIAGNOSIS — J1282 Pneumonia due to coronavirus disease 2019: Secondary | ICD-10-CM | POA: Diagnosis not present

## 2023-01-22 DIAGNOSIS — I130 Hypertensive heart and chronic kidney disease with heart failure and stage 1 through stage 4 chronic kidney disease, or unspecified chronic kidney disease: Secondary | ICD-10-CM | POA: Diagnosis not present

## 2023-01-28 NOTE — Progress Notes (Unsigned)
Cardiology Office Note   Date:  01/29/2023   ID:  Erin Ryan, DOB 1925/01/09, MRN [id number]  PCP:  Erin Montana, MD  Cardiologist:   Rollene Rotunda, MD   Chief Complaint  Patient presents with   Edema      History of Present Illness: Erin Ryan is a 89 y.o. female who presents for    who presents for evaluation of SOB.   She said this has been slowly progressive.  She was evaluated in the past by pulmonary as she was told she had COPD but when she saw Dr. Sherene Sires in 2018 he thought it was probably reflux and not COPD.   When I saw her for the first time recently I checked a BNP and this was markedly elevated at 1216.    Echo was unremarkable.    There was mild LVH she brings her oxygen saturations at her O2 sats are always in the 90s.  She does have tachycardia with heart rates typically in the 90s sometimes above 112.  There does seem to be correlation to heart rate being faster and the oxygen levels being lower although this. She had bradycardia and has only tolerated low dose beta blocker. She was taken off of ARB because of a cough.  She has had edema on Norvasc.  She was hospitalized in November 2023 with heart failure and had a small segmental PE.  EF was well-preserved.  She had atrial fibrillation was started on amiodarone.  She was managed with Eliquis.  And plan was for limited Eliquis given her advanced age and dementia.  She was in the hospital again in the spring of this year with COVID and pneumonia.  She had worsening renal function.  At the follow-up visit in the office she had some lower extremity swelling which she had been in rehab when they had taken her off of diuretic.  She was started on 40 mg of Lasix and the swelling went down.  She had follow-up creatinine which was stable.  She is very demented.  She is very weak and fatigued.  She gets around with a walker.  She lives now with her daughter.  She is not able to really answer any questions but there is been no  witnessed distress.  She has no acute shortness of breath, PND or orthopnea.  There is been no reported palpitations and she is not having any presyncope or syncope.  She is not falling excessively although she did fall once back into the bed when she was trying to get up.   Past Medical History:  Diagnosis Date   A-fib [facility])    Anxiety    Arthritis    COVID-19    04/2021   GERD (gastroesophageal reflux disease)    Hearing loss    has hearing aids   High cholesterol    Hypertension    Hypothyroid    Osteoporosis    Scoliosis    Thyroid disease     Past Surgical History:  Procedure Laterality Date   CATARACT EXTRACTION     cysts removed from bilateral breasts     THYROIDECTOMY       Current Outpatient Medications  Medication Sig Dispense Refill   Acetaminophen (TYLENOL EXTRA STRENGTH PO) Take 1,000 mg by mouth 3 (three) times daily.     amLODipine (NORVASC) 2.5 MG tablet Take 1 tablet (2.5 mg total) by mouth daily. 90 tablet 3   apixaban (ELIQUIS) 2.5 MG TABS tablet Take 1  tablet (2.5 mg total) by mouth 2 (two) times daily. 180 tablet 3   bethanechol (URECHOLINE) 10 MG tablet Take 1 tablet (10 mg total) by mouth 3 (three) times daily.     Calcium Citrate-Vitamin D (CALCIUM CITRATE + PO) Take 1 tablet 2 (two) times daily by mouth.     denosumab (PROLIA) 60 MG/ML SOSY injection Inject 60 mg into the skin every 6 (six) months.     donepezil (ARICEPT) 10 MG tablet Take 1 tablet (10 mg total) by mouth at bedtime. 90 tablet 3   DULoxetine (CYMBALTA) 30 MG capsule Take 30 mg by mouth daily.     gabapentin (NEURONTIN) 100 MG capsule Take 200 mg by mouth at bedtime.     lactose free nutrition (BOOST PLUS) LIQD Take 237 mLs by mouth 2 (two) times daily between meals.  0   levothyroxine (SYNTHROID) 100 MCG tablet Take 1 tablet (100 mcg total) by mouth in the morning. DAW1 Synthroid 30 tablet 1   levothyroxine (SYNTHROID) 112 MCG tablet Take 112 mcg by mouth daily before breakfast.      Metoprolol Tartrate 75 MG TABS Take 1 tablet (75 mg total) by mouth 2 (two) times daily. 180 tablet 6   mirtazapine (REMERON) 7.5 MG tablet Take 1 tablet (7.5 mg total) by mouth at bedtime.     Multiple Vitamins-Minerals (CENTRUM SILVER PO) Take 1 tablet by mouth daily.     omeprazole (PRILOSEC) 40 MG capsule Take 1 capsule (40 mg total) by mouth 2 (two) times daily before a meal. 180 capsule 3   polyvinyl alcohol (LIQUIFILM TEARS) 1.4 % ophthalmic solution Place 1 drop into both eyes as needed for dry eyes. 15 mL 0   furosemide (LASIX) 20 MG tablet Take 2 tablets (40 mg total) by mouth daily. 180 tablet 3   No current facility-administered medications for this visit.    Allergies:   Ambrosia trifida (tall ragweed) allergy skin test, Hydrochlorothiazide, Lyrica [pregabalin], Diltiazem hcl er, and Short ragweed pollen ext    ROS:  Please see the history of present illness.   Otherwise, review of systems are positive for none.   All other systems are reviewed and negative.    PHYSICAL EXAM: VS:  BP 124/66 (BP Location: Right Arm, Patient Position: Sitting, Cuff Size: Normal)   Pulse 78   Ht 5\' 1"  (1.549 m)   Wt 128 lb 3.2 oz (58.2 kg)   SpO2 98%   BMI 24.22 kg/m  , BMI Body mass index is 24.22 kg/m. GEN:  No distress NECK:  No jugular venous distention at 90 degrees, waveform within normal limits, carotid upstroke brisk and symmetric, no bruits, no thyromegaly LYMPHATICS:  No cervical adenopathy LUNGS:  Clear to auscultation bilaterally BACK:  No CVA tenderness CHEST:  Unremarkable HEART:  S1 and S2 within normal limits, no S3, no S4, no clicks, no rubs, no murmurs ABD:  Positive bowel sounds normal in frequency in pitch, no bruits, no rebound, no guarding, unable to assess midline mass or bruit with the patient seated. EXT:  2 plus pulses throughout, mild edema, no cyanosis no clubbing   EKG:  EKG is not ordered today. NA   Recent Labs: 12/19/2022: Hemoglobin 13.6; Platelets  258 12/20/2022: ALT 24; TSH 0.696 12/23/2022: Magnesium 2.0 01/20/2023: BNP 199.5; BUN 38; Creatinine, Ser 1.44; Potassium 5.2; Sodium 142    Lipid Panel No results found for: "CHOL", "TRIG", "HDL", "CHOLHDL", "VLDL", "LDLCALC", "LDLDIRECT"    Wt Readings from Last 3 Encounters:  01/29/23 128 lb 3.2 oz (58.2 kg)  12/22/22 140 lb (63.5 kg)  12/11/22 134 lb 12.8 oz (61.1 kg)      Other studies Reviewed: Additional studies/ records that were reviewed today include: Labs. Review of the above records demonstrates:  Please see elsewhere in the note.     ASSESSMENT AND PLAN:  Acute on chronic diastolic heart failure: She seems to be euvolemic.  I will be keeping a close eye on her blood work and get basic metabolic profile and CBC today.  For now she will continue the meds as listed.  Paroxysmal atrial fibrillation: I had a long discussion with her daughter.  The patient is high risk for thromboembolism with her advanced age and her previous paroxysmal atrial fibrillation.  She is also have pulmonary embolism.  At this point we have elected to continue her anticoagulation because she is not having any bleeding and no excessive fall risk is a watch her carefully but she would let me know if things change.  Pulmonary embolism: The plan was to possibly discontinue this in May.  See above.  HTN: Her blood pressure is controlled.  No change in therapy.  Dementia: This has worsened since COVID.  No change in therapy.  Dyslipidemia: She can get a repeat lipid profile at the next visit.  CKD 3B: Again we are watching her creatinine as above.   Current medicines are reviewed at length with the patient today.  The patient does not have concerns regarding medicines.  The following changes have been made:  no change  Labs/ tests ordered today include:   Orders Placed This Encounter  Procedures   Basic metabolic panel   CBC     Disposition:   FU with APP in 3 months.     Signed, Rollene Rotunda, MD  01/29/2023 12:52 PM    [facility]

## 2023-01-29 ENCOUNTER — Ambulatory Visit: Payer: Medicare Other | Attending: Cardiology | Admitting: Cardiology

## 2023-01-29 ENCOUNTER — Encounter: Payer: Self-pay | Admitting: Cardiology

## 2023-01-29 VITALS — BP 124/66 | HR 78 | Ht 61.0 in | Wt 128.2 lb

## 2023-01-29 DIAGNOSIS — E785 Hyperlipidemia, unspecified: Secondary | ICD-10-CM | POA: Diagnosis not present

## 2023-01-29 DIAGNOSIS — I10 Essential (primary) hypertension: Secondary | ICD-10-CM | POA: Insufficient documentation

## 2023-01-29 DIAGNOSIS — I480 Paroxysmal atrial fibrillation: Secondary | ICD-10-CM | POA: Diagnosis not present

## 2023-01-29 DIAGNOSIS — I5033 Acute on chronic diastolic (congestive) heart failure: Secondary | ICD-10-CM | POA: Insufficient documentation

## 2023-01-29 MED ORDER — FUROSEMIDE 20 MG PO TABS: 40.0000 mg | ORAL_TABLET | Freq: Every day | ORAL | 3 refills | Status: DC

## 2023-01-29 NOTE — Patient Instructions (Signed)
Medication Instructions:  Your physician recommends that you continue on your current medications as directed. Please refer to the Current Medication list given to you today.  *If you need a refill on your cardiac medications before your next appointment, please call your pharmacy*   Lab Work: Your physician recommends that you have labs drawn today: BMET & CBC   If you have labs (blood work) drawn today and your tests are completely normal, you will receive your results only by: MyChart Message (if you have MyChart) OR A paper copy in the mail If you have any lab test that is abnormal or we need to change your treatment, we will call you to review the results.    Follow-Up: At [facility], you and your health needs are our priority.  As part of our continuing mission to provide you with exceptional heart care, we have created designated Provider Care Teams.  These Care Teams include your primary Cardiologist (physician) and Advanced Practice Providers (APPs -  Physician Assistants and Nurse Practitioners) who all work together to provide you with the care you need, when you need it.  We recommend signing up for the patient portal called "MyChart".  Sign up information is provided on this After Visit Summary.  MyChart is used to connect with patients for Virtual Visits (Telemedicine).  Patients are able to view lab/test results, encounter notes, upcoming appointments, etc.  Non-urgent messages can be sent to your provider as well.   To learn more about what you can do with MyChart, go to [email or web address].    Your next appointment:   3 month(s)  Provider:   Bernadene Person, NP

## 2023-01-30 DIAGNOSIS — J1282 Pneumonia due to coronavirus disease 2019: Secondary | ICD-10-CM | POA: Diagnosis not present

## 2023-01-30 DIAGNOSIS — N1832 Chronic kidney disease, stage 3b: Secondary | ICD-10-CM | POA: Diagnosis not present

## 2023-01-30 DIAGNOSIS — N390 Urinary tract infection, site not specified: Secondary | ICD-10-CM | POA: Diagnosis not present

## 2023-01-30 DIAGNOSIS — U071 COVID-19: Secondary | ICD-10-CM | POA: Diagnosis not present

## 2023-01-30 DIAGNOSIS — I130 Hypertensive heart and chronic kidney disease with heart failure and stage 1 through stage 4 chronic kidney disease, or unspecified chronic kidney disease: Secondary | ICD-10-CM | POA: Diagnosis not present

## 2023-01-30 DIAGNOSIS — I5033 Acute on chronic diastolic (congestive) heart failure: Secondary | ICD-10-CM | POA: Diagnosis not present

## 2023-01-30 LAB — BASIC METABOLIC PANEL
BUN/Creatinine Ratio: 25 (ref 12–28)
BUN: 45 mg/dL — ABNORMAL HIGH (ref 10–36)
CO2: 25 mmol/L (ref 20–29)
Calcium: 9.3 mg/dL (ref 8.7–10.3)
Chloride: 98 mmol/L (ref 96–106)
Creatinine, Ser: 1.79 mg/dL — ABNORMAL HIGH (ref 0.57–1.00)
Glucose: 102 mg/dL — ABNORMAL HIGH (ref 70–99)
Potassium: 4.8 mmol/L (ref 3.5–5.2)
Sodium: 140 mmol/L (ref 134–144)
eGFR: 26 mL/min/{1.73_m2} — ABNORMAL LOW (ref >59)

## 2023-01-30 LAB — CBC
Hematocrit: 36.3 % (ref 34.0–46.6)
Hemoglobin: 12.3 g/dL (ref 11.1–15.9)
MCH: 31.5 pg (ref 26.6–33.0)
MCHC: 33.9 g/dL (ref 31.5–35.7)
MCV: 93 fL (ref 79–97)
Platelets: 286 10*3/uL (ref 150–450)
RBC: 3.91 x10E6/uL (ref 3.77–5.28)
RDW: 15.6 % — ABNORMAL HIGH (ref 11.7–15.4)
WBC: 9.4 10*3/uL (ref 3.4–10.8)

## 2023-02-02 ENCOUNTER — Other Ambulatory Visit: Payer: Self-pay

## 2023-02-02 DIAGNOSIS — N1832 Chronic kidney disease, stage 3b: Secondary | ICD-10-CM | POA: Diagnosis not present

## 2023-02-02 DIAGNOSIS — U071 COVID-19: Secondary | ICD-10-CM | POA: Diagnosis not present

## 2023-02-02 DIAGNOSIS — I5033 Acute on chronic diastolic (congestive) heart failure: Secondary | ICD-10-CM | POA: Diagnosis not present

## 2023-02-02 DIAGNOSIS — I130 Hypertensive heart and chronic kidney disease with heart failure and stage 1 through stage 4 chronic kidney disease, or unspecified chronic kidney disease: Secondary | ICD-10-CM | POA: Diagnosis not present

## 2023-02-02 DIAGNOSIS — N390 Urinary tract infection, site not specified: Secondary | ICD-10-CM | POA: Diagnosis not present

## 2023-02-02 DIAGNOSIS — J1282 Pneumonia due to coronavirus disease 2019: Secondary | ICD-10-CM | POA: Diagnosis not present

## 2023-02-03 ENCOUNTER — Other Ambulatory Visit: Payer: Self-pay | Admitting: *Deleted

## 2023-02-03 ENCOUNTER — Encounter: Payer: Self-pay | Admitting: *Deleted

## 2023-02-03 DIAGNOSIS — N1832 Chronic kidney disease, stage 3b: Secondary | ICD-10-CM

## 2023-02-06 DIAGNOSIS — U071 COVID-19: Secondary | ICD-10-CM | POA: Diagnosis not present

## 2023-02-06 DIAGNOSIS — J1282 Pneumonia due to coronavirus disease 2019: Secondary | ICD-10-CM | POA: Diagnosis not present

## 2023-02-06 DIAGNOSIS — I130 Hypertensive heart and chronic kidney disease with heart failure and stage 1 through stage 4 chronic kidney disease, or unspecified chronic kidney disease: Secondary | ICD-10-CM | POA: Diagnosis not present

## 2023-02-06 DIAGNOSIS — N390 Urinary tract infection, site not specified: Secondary | ICD-10-CM | POA: Diagnosis not present

## 2023-02-06 DIAGNOSIS — R1030 Lower abdominal pain, unspecified: Secondary | ICD-10-CM | POA: Diagnosis not present

## 2023-02-06 DIAGNOSIS — R399 Unspecified symptoms and signs involving the genitourinary system: Secondary | ICD-10-CM | POA: Diagnosis not present

## 2023-02-06 DIAGNOSIS — I5033 Acute on chronic diastolic (congestive) heart failure: Secondary | ICD-10-CM | POA: Diagnosis not present

## 2023-02-06 DIAGNOSIS — N1832 Chronic kidney disease, stage 3b: Secondary | ICD-10-CM | POA: Diagnosis not present

## 2023-02-10 DIAGNOSIS — I5033 Acute on chronic diastolic (congestive) heart failure: Secondary | ICD-10-CM | POA: Diagnosis not present

## 2023-02-10 DIAGNOSIS — J1282 Pneumonia due to coronavirus disease 2019: Secondary | ICD-10-CM | POA: Diagnosis not present

## 2023-02-10 DIAGNOSIS — N390 Urinary tract infection, site not specified: Secondary | ICD-10-CM | POA: Diagnosis not present

## 2023-02-10 DIAGNOSIS — U071 COVID-19: Secondary | ICD-10-CM | POA: Diagnosis not present

## 2023-02-10 DIAGNOSIS — I130 Hypertensive heart and chronic kidney disease with heart failure and stage 1 through stage 4 chronic kidney disease, or unspecified chronic kidney disease: Secondary | ICD-10-CM | POA: Diagnosis not present

## 2023-02-10 DIAGNOSIS — N1832 Chronic kidney disease, stage 3b: Secondary | ICD-10-CM | POA: Diagnosis not present

## 2023-02-12 DIAGNOSIS — U071 COVID-19: Secondary | ICD-10-CM | POA: Diagnosis not present

## 2023-02-12 DIAGNOSIS — N1832 Chronic kidney disease, stage 3b: Secondary | ICD-10-CM | POA: Diagnosis not present

## 2023-02-12 DIAGNOSIS — R339 Retention of urine, unspecified: Secondary | ICD-10-CM | POA: Diagnosis not present

## 2023-02-12 DIAGNOSIS — F02C3 Dementia in other diseases classified elsewhere, severe, with mood disturbance: Secondary | ICD-10-CM | POA: Diagnosis not present

## 2023-02-12 DIAGNOSIS — J1282 Pneumonia due to coronavirus disease 2019: Secondary | ICD-10-CM | POA: Diagnosis not present

## 2023-02-12 DIAGNOSIS — Z974 Presence of external hearing-aid: Secondary | ICD-10-CM | POA: Diagnosis not present

## 2023-02-12 DIAGNOSIS — K77 Liver disorders in diseases classified elsewhere: Secondary | ICD-10-CM | POA: Diagnosis not present

## 2023-02-12 DIAGNOSIS — F32A Depression, unspecified: Secondary | ICD-10-CM | POA: Diagnosis not present

## 2023-02-12 DIAGNOSIS — Z86711 Personal history of pulmonary embolism: Secondary | ICD-10-CM | POA: Diagnosis not present

## 2023-02-12 DIAGNOSIS — M810 Age-related osteoporosis without current pathological fracture: Secondary | ICD-10-CM | POA: Diagnosis not present

## 2023-02-12 DIAGNOSIS — H9190 Unspecified hearing loss, unspecified ear: Secondary | ICD-10-CM | POA: Diagnosis not present

## 2023-02-12 DIAGNOSIS — N390 Urinary tract infection, site not specified: Secondary | ICD-10-CM | POA: Diagnosis not present

## 2023-02-12 DIAGNOSIS — E7800 Pure hypercholesterolemia, unspecified: Secondary | ICD-10-CM | POA: Diagnosis not present

## 2023-02-12 DIAGNOSIS — Z960 Presence of urogenital implants: Secondary | ICD-10-CM | POA: Diagnosis not present

## 2023-02-12 DIAGNOSIS — K219 Gastro-esophageal reflux disease without esophagitis: Secondary | ICD-10-CM | POA: Diagnosis not present

## 2023-02-12 DIAGNOSIS — M1990 Unspecified osteoarthritis, unspecified site: Secondary | ICD-10-CM | POA: Diagnosis not present

## 2023-02-12 DIAGNOSIS — F02C4 Dementia in other diseases classified elsewhere, severe, with anxiety: Secondary | ICD-10-CM | POA: Diagnosis not present

## 2023-02-12 DIAGNOSIS — Z7901 Long term (current) use of anticoagulants: Secondary | ICD-10-CM | POA: Diagnosis not present

## 2023-02-12 DIAGNOSIS — I130 Hypertensive heart and chronic kidney disease with heart failure and stage 1 through stage 4 chronic kidney disease, or unspecified chronic kidney disease: Secondary | ICD-10-CM | POA: Diagnosis not present

## 2023-02-12 DIAGNOSIS — I5033 Acute on chronic diastolic (congestive) heart failure: Secondary | ICD-10-CM | POA: Diagnosis not present

## 2023-02-12 DIAGNOSIS — I480 Paroxysmal atrial fibrillation: Secondary | ICD-10-CM | POA: Diagnosis not present

## 2023-02-12 DIAGNOSIS — N179 Acute kidney failure, unspecified: Secondary | ICD-10-CM | POA: Diagnosis not present

## 2023-02-12 DIAGNOSIS — R350 Frequency of micturition: Secondary | ICD-10-CM | POA: Diagnosis not present

## 2023-02-12 DIAGNOSIS — E039 Hypothyroidism, unspecified: Secondary | ICD-10-CM | POA: Diagnosis not present

## 2023-02-12 DIAGNOSIS — M419 Scoliosis, unspecified: Secondary | ICD-10-CM | POA: Diagnosis not present

## 2023-02-13 DIAGNOSIS — I130 Hypertensive heart and chronic kidney disease with heart failure and stage 1 through stage 4 chronic kidney disease, or unspecified chronic kidney disease: Secondary | ICD-10-CM | POA: Diagnosis not present

## 2023-02-13 DIAGNOSIS — U071 COVID-19: Secondary | ICD-10-CM | POA: Diagnosis not present

## 2023-02-13 DIAGNOSIS — J1282 Pneumonia due to coronavirus disease 2019: Secondary | ICD-10-CM | POA: Diagnosis not present

## 2023-02-13 DIAGNOSIS — N1832 Chronic kidney disease, stage 3b: Secondary | ICD-10-CM | POA: Diagnosis not present

## 2023-02-13 DIAGNOSIS — N390 Urinary tract infection, site not specified: Secondary | ICD-10-CM | POA: Diagnosis not present

## 2023-02-13 DIAGNOSIS — I5033 Acute on chronic diastolic (congestive) heart failure: Secondary | ICD-10-CM | POA: Diagnosis not present

## 2023-02-18 DIAGNOSIS — N1832 Chronic kidney disease, stage 3b: Secondary | ICD-10-CM | POA: Diagnosis not present

## 2023-02-18 DIAGNOSIS — U071 COVID-19: Secondary | ICD-10-CM | POA: Diagnosis not present

## 2023-02-18 DIAGNOSIS — I5033 Acute on chronic diastolic (congestive) heart failure: Secondary | ICD-10-CM | POA: Diagnosis not present

## 2023-02-18 DIAGNOSIS — I130 Hypertensive heart and chronic kidney disease with heart failure and stage 1 through stage 4 chronic kidney disease, or unspecified chronic kidney disease: Secondary | ICD-10-CM | POA: Diagnosis not present

## 2023-02-18 DIAGNOSIS — J1282 Pneumonia due to coronavirus disease 2019: Secondary | ICD-10-CM | POA: Diagnosis not present

## 2023-02-18 DIAGNOSIS — N390 Urinary tract infection, site not specified: Secondary | ICD-10-CM | POA: Diagnosis not present

## 2023-02-19 DIAGNOSIS — M7662 Achilles tendinitis, left leg: Secondary | ICD-10-CM | POA: Diagnosis not present

## 2023-02-19 DIAGNOSIS — M1711 Unilateral primary osteoarthritis, right knee: Secondary | ICD-10-CM | POA: Diagnosis not present

## 2023-03-10 DIAGNOSIS — N390 Urinary tract infection, site not specified: Secondary | ICD-10-CM | POA: Diagnosis not present

## 2023-03-10 DIAGNOSIS — R4182 Altered mental status, unspecified: Secondary | ICD-10-CM | POA: Diagnosis not present

## 2023-03-13 DIAGNOSIS — N1832 Chronic kidney disease, stage 3b: Secondary | ICD-10-CM | POA: Diagnosis not present

## 2023-03-14 LAB — BASIC METABOLIC PANEL
BUN/Creatinine Ratio: 27 (ref 12–28)
BUN: 36 mg/dL (ref 10–36)
CO2: 26 mmol/L (ref 20–29)
Calcium: 8.5 mg/dL — ABNORMAL LOW (ref 8.7–10.3)
Chloride: 103 mmol/L (ref 96–106)
Creatinine, Ser: 1.34 mg/dL — ABNORMAL HIGH (ref 0.57–1.00)
Glucose: 105 mg/dL — ABNORMAL HIGH (ref 70–99)
Potassium: 4.1 mmol/L (ref 3.5–5.2)
Sodium: 143 mmol/L (ref 134–144)
eGFR: 36 mL/min/{1.73_m2} — ABNORMAL LOW (ref >59)

## 2023-03-18 ENCOUNTER — Encounter: Payer: Self-pay | Admitting: *Deleted

## 2023-03-19 DIAGNOSIS — Z961 Presence of intraocular lens: Secondary | ICD-10-CM | POA: Diagnosis not present

## 2023-03-19 DIAGNOSIS — H52203 Unspecified astigmatism, bilateral: Secondary | ICD-10-CM | POA: Diagnosis not present

## 2023-03-19 DIAGNOSIS — H31001 Unspecified chorioretinal scars, right eye: Secondary | ICD-10-CM | POA: Diagnosis not present

## 2023-03-28 ENCOUNTER — Emergency Department (HOSPITAL_COMMUNITY): Payer: Medicare Other

## 2023-03-28 ENCOUNTER — Inpatient Hospital Stay (HOSPITAL_COMMUNITY)
Admission: EM | Admit: 2023-03-28 | Discharge: 2023-04-01 | DRG: 871 | Disposition: A | Discharge status: Hospice | Payer: Medicare Other | Attending: Internal Medicine | Admitting: Internal Medicine

## 2023-03-28 ENCOUNTER — Other Ambulatory Visit: Payer: Self-pay

## 2023-03-28 ENCOUNTER — Encounter (HOSPITAL_COMMUNITY): Payer: Self-pay

## 2023-03-28 DIAGNOSIS — A419 Sepsis, unspecified organism: Secondary | ICD-10-CM | POA: Diagnosis not present

## 2023-03-28 DIAGNOSIS — F32A Depression, unspecified: Secondary | ICD-10-CM | POA: Diagnosis not present

## 2023-03-28 DIAGNOSIS — J811 Chronic pulmonary edema: Secondary | ICD-10-CM | POA: Diagnosis not present

## 2023-03-28 DIAGNOSIS — Z825 Family history of asthma and other chronic lower respiratory diseases: Secondary | ICD-10-CM

## 2023-03-28 DIAGNOSIS — E890 Postprocedural hypothyroidism: Secondary | ICD-10-CM | POA: Diagnosis present

## 2023-03-28 DIAGNOSIS — Z91048 Other nonmedicinal substance allergy status: Secondary | ICD-10-CM

## 2023-03-28 DIAGNOSIS — K5750 Diverticulosis of both small and large intestine without perforation or abscess without bleeding: Secondary | ICD-10-CM | POA: Diagnosis not present

## 2023-03-28 DIAGNOSIS — H9190 Unspecified hearing loss, unspecified ear: Secondary | ICD-10-CM | POA: Diagnosis present

## 2023-03-28 DIAGNOSIS — R9431 Abnormal electrocardiogram [ECG] [EKG]: Secondary | ICD-10-CM | POA: Diagnosis not present

## 2023-03-28 DIAGNOSIS — Z8616 Personal history of COVID-19: Secondary | ICD-10-CM | POA: Diagnosis not present

## 2023-03-28 DIAGNOSIS — Z515 Encounter for palliative care: Secondary | ICD-10-CM

## 2023-03-28 DIAGNOSIS — N179 Acute kidney failure, unspecified: Secondary | ICD-10-CM | POA: Diagnosis present

## 2023-03-28 DIAGNOSIS — A0811 Acute gastroenteropathy due to Norwalk agent: Secondary | ICD-10-CM | POA: Diagnosis present

## 2023-03-28 DIAGNOSIS — J690 Pneumonitis due to inhalation of food and vomit: Secondary | ICD-10-CM | POA: Diagnosis present

## 2023-03-28 DIAGNOSIS — I5032 Chronic diastolic (congestive) heart failure: Secondary | ICD-10-CM | POA: Diagnosis present

## 2023-03-28 DIAGNOSIS — E876 Hypokalemia: Secondary | ICD-10-CM | POA: Diagnosis present

## 2023-03-28 DIAGNOSIS — E872 Acidosis, unspecified: Secondary | ICD-10-CM | POA: Diagnosis present

## 2023-03-28 DIAGNOSIS — F0393 Unspecified dementia, unspecified severity, with mood disturbance: Secondary | ICD-10-CM | POA: Diagnosis present

## 2023-03-28 DIAGNOSIS — K219 Gastro-esophageal reflux disease without esophagitis: Secondary | ICD-10-CM | POA: Diagnosis present

## 2023-03-28 DIAGNOSIS — Z7901 Long term (current) use of anticoagulants: Secondary | ICD-10-CM

## 2023-03-28 DIAGNOSIS — Z87440 Personal history of urinary (tract) infections: Secondary | ICD-10-CM | POA: Diagnosis not present

## 2023-03-28 DIAGNOSIS — R0989 Other specified symptoms and signs involving the circulatory and respiratory systems: Secondary | ICD-10-CM | POA: Diagnosis not present

## 2023-03-28 DIAGNOSIS — I480 Paroxysmal atrial fibrillation: Secondary | ICD-10-CM | POA: Diagnosis present

## 2023-03-28 DIAGNOSIS — E7800 Pure hypercholesterolemia, unspecified: Secondary | ICD-10-CM | POA: Diagnosis present

## 2023-03-28 DIAGNOSIS — I4891 Unspecified atrial fibrillation: Secondary | ICD-10-CM | POA: Diagnosis not present

## 2023-03-28 DIAGNOSIS — N320 Bladder-neck obstruction: Secondary | ICD-10-CM | POA: Diagnosis present

## 2023-03-28 DIAGNOSIS — N390 Urinary tract infection, site not specified: Secondary | ICD-10-CM | POA: Diagnosis present

## 2023-03-28 DIAGNOSIS — M419 Scoliosis, unspecified: Secondary | ICD-10-CM | POA: Diagnosis present

## 2023-03-28 DIAGNOSIS — Z66 Do not resuscitate: Secondary | ICD-10-CM | POA: Diagnosis present

## 2023-03-28 DIAGNOSIS — Z8249 Family history of ischemic heart disease and other diseases of the circulatory system: Secondary | ICD-10-CM

## 2023-03-28 DIAGNOSIS — Z79899 Other long term (current) drug therapy: Secondary | ICD-10-CM | POA: Diagnosis not present

## 2023-03-28 DIAGNOSIS — N1832 Chronic kidney disease, stage 3b: Secondary | ICD-10-CM | POA: Diagnosis not present

## 2023-03-28 DIAGNOSIS — Z8052 Family history of malignant neoplasm of bladder: Secondary | ICD-10-CM

## 2023-03-28 DIAGNOSIS — J90 Pleural effusion, not elsewhere classified: Secondary | ICD-10-CM | POA: Diagnosis not present

## 2023-03-28 DIAGNOSIS — R6520 Severe sepsis without septic shock: Secondary | ICD-10-CM | POA: Diagnosis present

## 2023-03-28 DIAGNOSIS — I10 Essential (primary) hypertension: Secondary | ICD-10-CM | POA: Diagnosis not present

## 2023-03-28 DIAGNOSIS — I509 Heart failure, unspecified: Secondary | ICD-10-CM | POA: Diagnosis not present

## 2023-03-28 DIAGNOSIS — F0394 Unspecified dementia, unspecified severity, with anxiety: Secondary | ICD-10-CM | POA: Diagnosis present

## 2023-03-28 DIAGNOSIS — E785 Hyperlipidemia, unspecified: Secondary | ICD-10-CM | POA: Diagnosis not present

## 2023-03-28 DIAGNOSIS — R1111 Vomiting without nausea: Secondary | ICD-10-CM | POA: Diagnosis not present

## 2023-03-28 DIAGNOSIS — E8770 Fluid overload, unspecified: Secondary | ICD-10-CM | POA: Diagnosis not present

## 2023-03-28 DIAGNOSIS — Z79890 Hormone replacement therapy (postmenopausal): Secondary | ICD-10-CM | POA: Diagnosis not present

## 2023-03-28 DIAGNOSIS — R000 Tachycardia, unspecified: Secondary | ICD-10-CM | POA: Diagnosis not present

## 2023-03-28 DIAGNOSIS — Z7401 Bed confinement status: Secondary | ICD-10-CM | POA: Diagnosis not present

## 2023-03-28 DIAGNOSIS — G9341 Metabolic encephalopathy: Secondary | ICD-10-CM | POA: Diagnosis present

## 2023-03-28 DIAGNOSIS — Z888 Allergy status to other drugs, medicaments and biological substances status: Secondary | ICD-10-CM

## 2023-03-28 DIAGNOSIS — K7689 Other specified diseases of liver: Secondary | ICD-10-CM | POA: Diagnosis not present

## 2023-03-28 DIAGNOSIS — I130 Hypertensive heart and chronic kidney disease with heart failure and stage 1 through stage 4 chronic kidney disease, or unspecified chronic kidney disease: Secondary | ICD-10-CM | POA: Diagnosis not present

## 2023-03-28 DIAGNOSIS — R531 Weakness: Secondary | ICD-10-CM | POA: Diagnosis not present

## 2023-03-28 DIAGNOSIS — R509 Fever, unspecified: Secondary | ICD-10-CM | POA: Diagnosis not present

## 2023-03-28 DIAGNOSIS — F03918 Unspecified dementia, unspecified severity, with other behavioral disturbance: Secondary | ICD-10-CM | POA: Diagnosis not present

## 2023-03-28 DIAGNOSIS — J984 Other disorders of lung: Secondary | ICD-10-CM | POA: Diagnosis not present

## 2023-03-28 DIAGNOSIS — M810 Age-related osteoporosis without current pathological fracture: Secondary | ICD-10-CM | POA: Diagnosis present

## 2023-03-28 DIAGNOSIS — E039 Hypothyroidism, unspecified: Secondary | ICD-10-CM | POA: Diagnosis not present

## 2023-03-28 DIAGNOSIS — I110 Hypertensive heart disease with heart failure: Secondary | ICD-10-CM | POA: Diagnosis not present

## 2023-03-28 HISTORY — DX: Unspecified dementia, unspecified severity, without behavioral disturbance, psychotic disturbance, mood disturbance, and anxiety: F03.90

## 2023-03-28 LAB — CBC WITH DIFFERENTIAL/PLATELET
Abs Immature Granulocytes: 0.07 10*3/uL (ref 0.00–0.07)
Basophils Absolute: 0 10*3/uL (ref 0.0–0.1)
Basophils Relative: 0 %
Eosinophils Absolute: 0 10*3/uL (ref 0.0–0.5)
Eosinophils Relative: 0 %
HCT: 38.7 % (ref 36.0–46.0)
Hemoglobin: 12.2 g/dL (ref 12.0–15.0)
Immature Granulocytes: 1 %
Lymphocytes Relative: 5 %
Lymphs Abs: 0.5 10*3/uL — ABNORMAL LOW (ref 0.7–4.0)
MCH: 30.7 pg (ref 26.0–34.0)
MCHC: 31.5 g/dL (ref 30.0–36.0)
MCV: 97.5 fL (ref 80.0–100.0)
Monocytes Absolute: 0.3 10*3/uL (ref 0.1–1.0)
Monocytes Relative: 3 %
Neutro Abs: 9.3 10*3/uL — ABNORMAL HIGH (ref 1.7–7.7)
Neutrophils Relative %: 91 %
Platelets: 301 10*3/uL (ref 150–400)
RBC: 3.97 MIL/uL (ref 3.87–5.11)
RDW: 14.5 % (ref 11.5–15.5)
WBC: 10.2 10*3/uL (ref 4.0–10.5)
nRBC: 0 % (ref 0.0–0.2)

## 2023-03-28 LAB — COMPREHENSIVE METABOLIC PANEL
ALT: 25 U/L (ref 0–44)
AST: 34 U/L (ref 15–41)
Albumin: 3 g/dL — ABNORMAL LOW (ref 3.5–5.0)
Alkaline Phosphatase: 69 U/L (ref 38–126)
Anion gap: 15 (ref 5–15)
BUN: 39 mg/dL — ABNORMAL HIGH (ref 8–23)
CO2: 22 mmol/L (ref 22–32)
Calcium: 8.2 mg/dL — ABNORMAL LOW (ref 8.9–10.3)
Chloride: 103 mmol/L (ref 98–111)
Creatinine, Ser: 1.46 mg/dL — ABNORMAL HIGH (ref 0.44–1.00)
GFR, Estimated: 33 mL/min — ABNORMAL LOW (ref >60)
Glucose, Bld: 153 mg/dL — ABNORMAL HIGH (ref 70–99)
Potassium: 3.8 mmol/L (ref 3.5–5.1)
Sodium: 140 mmol/L (ref 135–145)
Total Bilirubin: 0.5 mg/dL (ref 0.3–1.2)
Total Protein: 7.1 g/dL (ref 6.5–8.1)

## 2023-03-28 LAB — URINALYSIS, ROUTINE W REFLEX MICROSCOPIC
Bilirubin Urine: NEGATIVE
Glucose, UA: NEGATIVE mg/dL
Hgb urine dipstick: NEGATIVE
Ketones, ur: NEGATIVE mg/dL
Nitrite: NEGATIVE
Protein, ur: 30 mg/dL — AB
Specific Gravity, Urine: 1.02 (ref 1.005–1.030)
WBC, UA: >50 WBC/hpf (ref 0–5)
pH: 5 (ref 5.0–8.0)

## 2023-03-28 LAB — LACTIC ACID, PLASMA: Lactic Acid, Venous: 4.3 mmol/L (ref 0.5–1.9)

## 2023-03-28 MED ORDER — VANCOMYCIN HCL 1250 MG/250ML IV SOLN
1250.0000 mg | Freq: Once | INTRAVENOUS | Status: AC
  Administered 2023-03-28: 1250 mg via INTRAVENOUS
  Filled 2023-03-28: qty 250

## 2023-03-28 MED ORDER — LACTATED RINGERS IV BOLUS
1000.0000 mL | Freq: Once | INTRAVENOUS | Status: AC
  Administered 2023-03-28: 1000 mL via INTRAVENOUS

## 2023-03-28 MED ORDER — SODIUM CHLORIDE 0.9 % IV SOLN
2.0000 g | Freq: Once | INTRAVENOUS | Status: AC
  Administered 2023-03-28: 2 g via INTRAVENOUS
  Filled 2023-03-28: qty 12.5

## 2023-03-28 MED ORDER — ONDANSETRON HCL 4 MG/2ML IJ SOLN
4.0000 mg | Freq: Once | INTRAMUSCULAR | Status: AC
  Administered 2023-03-28: 4 mg via INTRAVENOUS
  Filled 2023-03-28: qty 2

## 2023-03-28 NOTE — Progress Notes (Signed)
A consult was received from an ED physician for vancomycin per pharmacy dosing.  The patient's profile has been reviewed for ht/wt/allergies/indication/available labs.   A one time order has been placed for vancomycin 1250 mg IV x1 & cefepime 2 gm IV x 1.    Further antibiotics/pharmacy consults should be ordered by admitting physician if indicated.                       Thank you,  Herby Abraham, Pharm.D Use secure chat for questions 03/28/2023 9:24 PM

## 2023-03-28 NOTE — ED Triage Notes (Signed)
Pt arrives via EMS from home where she lives with her daughter. Pt has dementia with confusion at baseline, pt alert to name, knows she is in a hospital in [place] but confused to all other details at this time. Pt dry heaving in stretcher during triage assessment, dried emesis present on pt pj top. EMS reports pt was previously treated with two different antibiotics, EMS reports "we did get a whiff when we scooted her over that smelled like C. Diff." Pt placed on enteric contact precautions at this time until able to test pt for C. Diff. Pt daughter reportedly on the way but not present during triage. Pt currently just tremulous and reports "I'm about to freeze to death." Pt other denies any c/o pain currently.

## 2023-03-28 NOTE — Progress Notes (Signed)
Elink monitoring for the code sepsis protocol.  

## 2023-03-28 NOTE — ED Provider Notes (Signed)
[place] EMERGENCY DEPARTMENT AT [facility] Provider Note   CSN: [id number] Arrival date & time: 03/28/23  2053     History Chief Complaint  Patient presents with   Nausea   Vomiting        Diarrhea    HPI Erin Ryan is a 89 y.o. female presenting for chief complaint of altered mental status.  Recently diagnosed with a UTI has been on 2 different antibiotics.  Patient states that she has had substantial nausea vomiting diarrhea today.  Was covered in bowel movement when EMS arrived. Started having abdominal pain and fevers throughout the day today.  Patient has dementia at baseline cannot provide much collateral history but family is en route..  Collateral history: Patient's daughter is at bedside states that she has been overall doing well at home up until today where she popped a fever of 101.  The family however has had multiple sick family members with gastroenteritis with nausea vomiting and diarrhea over the past week.  Patient has been getting treated with 2 antibiotics and finished one of the antibiotics yesterday.  Family member does not know the name of the medications.  States that all of her symptoms acutely worsened this evening.  Patient's recorded medical, surgical, social, medication list and allergies were reviewed in the Snapshot window as part of the initial history.   Review of Systems   Review of Systems  Constitutional:  Negative for chills and fever.  HENT:  Negative for ear pain and sore throat.   Eyes:  Negative for pain and visual disturbance.  Respiratory:  Negative for cough and shortness of breath.   Cardiovascular:  Negative for chest pain and palpitations.  Gastrointestinal:  Positive for diarrhea, nausea and vomiting. Negative for abdominal pain.  Genitourinary:  Negative for dysuria and hematuria.  Musculoskeletal:  Negative for arthralgias and back pain.  Skin:  Negative for color change and rash.  Neurological:  Negative for  seizures and syncope.  All other systems reviewed and are negative.   Physical Exam Updated Vital Signs BP 139/72   Pulse (!) 103   Temp (!) 100.8 F (38.2 C) (Rectal)   Resp (!) 22   SpO2 97%  Physical Exam Vitals and nursing note reviewed.  Constitutional:      General: She is not in acute distress.    Appearance: She is well-developed. She is ill-appearing.  HENT:     Head: Normocephalic and atraumatic.  Eyes:     Conjunctiva/sclera: Conjunctivae normal.  Cardiovascular:     Rate and Rhythm: Normal rate and regular rhythm.     Heart sounds: No murmur heard. Pulmonary:     Effort: Pulmonary effort is normal. No respiratory distress.     Breath sounds: Normal breath sounds.  Abdominal:     General: There is no distension.     Palpations: Abdomen is soft.     Tenderness: There is no abdominal tenderness. There is no right CVA tenderness or left CVA tenderness.  Musculoskeletal:        General: No swelling or tenderness. Normal range of motion.     Cervical back: Neck supple.  Skin:    General: Skin is warm and dry.  Neurological:     General: No focal deficit present.     Mental Status: She is alert. She is disoriented.     Cranial Nerves: No cranial nerve deficit.      ED Course/ Medical Decision Making/ A&P Clinical Course as of 03/28/23  2248  Sat Mar 28, 2023  2206 Reassessed at bedside.  Heart rate coming down with IV fluids.   [CC]    Clinical Course User Index [CC] Glyn Ade, MD    Procedures .Critical Care  Performed by: Glyn Ade, MD Authorized by: Glyn Ade, MD   Critical care provider statement:    Critical care time (minutes):  30   Critical care was necessary to treat or prevent imminent or life-threatening deterioration of the following conditions:  Sepsis and dehydration   Critical care was time spent personally by me on the following activities:  Development of treatment plan with patient or surrogate, discussions  with consultants, evaluation of patient's response to treatment, examination of patient, ordering and review of laboratory studies, ordering and review of radiographic studies, ordering and performing treatments and interventions, pulse oximetry, re-evaluation of patient's condition and review of old charts   Care discussed with: admitting provider      Medications Ordered in ED Medications  vancomycin (VANCOREADY) IVPB 1250 mg/250 mL (has no administration in time range)  lactated ringers bolus 1,000 mL (1,000 mLs Intravenous New Bag/Given 03/28/23 2157)  ceFEPIme (MAXIPIME) 2 g in sodium chloride 0.9 % 100 mL IVPB (2 g Intravenous New Bag/Given 03/28/23 2202)  ondansetron (ZOFRAN) injection 4 mg (4 mg Intravenous Given 03/28/23 2212)   Medical Decision Making:   Erin Ryan is a 89 y.o. female who presented to the ED today with multiple symptoms detailed above.    Handoff received from EMS.  Additional history discussed with patient's family/caregivers.  Patient placed on continuous vitals and telemetry monitoring while in ED which was reviewed periodically.  Complete initial physical exam performed, notably the patient  was ill-appearing. Activated code sepsis for fever, tachycardia, tachypnea with suspected etiology of intra-abdominal infection. Reviewed and confirmed nursing documentation for past medical history, family history, social history.    Initial Assessment:   With the patient's presentation of signs and symptoms of sepsis, most likely diagnosis is bacteremia secondary to underlying infection.  Considerations for source were initiated including:Urinary tract infections, abdominal infections such as cholecystitis/cholangitis/appendicitis, pulmonary etiology, bacteremia, skin etiology such as cellulitis or fasciitis, neurologic etiology such as meningitis or encephalitis.  This is most consistent with an acute life/limb threatening illness complicated by underlying chronic  conditions.  Initial Plan:  Activated hospital protocol code sepsis including blood cultures, lactic acid screening, and further diagnostic care and management. Therapeutically, resuscitation fluids were considered. Patient has a history of heart failure and clinical evidence of volume overload therefore 30 cc/kg is not indicated. Therapeutically, broad-spectrum antibiotics were administered for code sepsis activation in an elderly female of uncertain etiology.  Vancomycin and cefepime were administered.  Suspected gastroenterologic versus urologic versus pulmonary etiology initially therefore broad-spectrum antibiosis.  Will be narrowed as able. Screening labs including CBC and Metabolic panel to evaluate for infectious or metabolic etiology of disease.  Urinalysis with reflex culture ordered to evaluate for UTI or relevant urologic/nephrologic pathology.  CXR to evaluate for structural/infectious intrathoracic pathology.  EKG to evaluate for cardiac pathology CT abdomen pelvis to evaluate for intra-abdominal infection C. difficile studies given recent exposure to antibiotics and frequent diarrhea per patient as well as gastrointestinal pathology panel. Objective evaluation as below reviewed   Initial Study Results:   Initial studies resulted with lactic acidosis,Leukocytosis.  Patient already being treated for sepsis.  Pending CT abdomen pelvis with contrast for further diagnostic care management and disposition.  Care patient handoff to oncoming provider at this time,  please see their note for ultimate care disposition. Clinical Impression:  1. Sepsis, due to unspecified organism, unspecified whether acute organ dysfunction present [facility])      Data Unavailable    Final Clinical Impression(s) / ED Diagnoses Final diagnoses:  Sepsis, due to unspecified organism, unspecified whether acute organ dysfunction present [facility])    Rx / DC Orders ED Discharge Orders     None          Glyn Ade, MD 03/29/23 1449

## 2023-03-29 ENCOUNTER — Emergency Department (HOSPITAL_COMMUNITY): Payer: Medicare Other

## 2023-03-29 DIAGNOSIS — J690 Pneumonitis due to inhalation of food and vomit: Secondary | ICD-10-CM | POA: Diagnosis present

## 2023-03-29 DIAGNOSIS — Z87440 Personal history of urinary (tract) infections: Secondary | ICD-10-CM | POA: Diagnosis not present

## 2023-03-29 DIAGNOSIS — Z515 Encounter for palliative care: Secondary | ICD-10-CM | POA: Diagnosis not present

## 2023-03-29 DIAGNOSIS — F32A Depression, unspecified: Secondary | ICD-10-CM | POA: Diagnosis not present

## 2023-03-29 DIAGNOSIS — F0394 Unspecified dementia, unspecified severity, with anxiety: Secondary | ICD-10-CM | POA: Diagnosis present

## 2023-03-29 DIAGNOSIS — R6520 Severe sepsis without septic shock: Secondary | ICD-10-CM | POA: Diagnosis present

## 2023-03-29 DIAGNOSIS — F0393 Unspecified dementia, unspecified severity, with mood disturbance: Secondary | ICD-10-CM | POA: Diagnosis present

## 2023-03-29 DIAGNOSIS — E785 Hyperlipidemia, unspecified: Secondary | ICD-10-CM | POA: Diagnosis not present

## 2023-03-29 DIAGNOSIS — A419 Sepsis, unspecified organism: Principal | ICD-10-CM

## 2023-03-29 DIAGNOSIS — N390 Urinary tract infection, site not specified: Secondary | ICD-10-CM | POA: Diagnosis present

## 2023-03-29 DIAGNOSIS — R531 Weakness: Secondary | ICD-10-CM | POA: Diagnosis not present

## 2023-03-29 DIAGNOSIS — I480 Paroxysmal atrial fibrillation: Secondary | ICD-10-CM | POA: Diagnosis present

## 2023-03-29 DIAGNOSIS — K219 Gastro-esophageal reflux disease without esophagitis: Secondary | ICD-10-CM | POA: Diagnosis present

## 2023-03-29 DIAGNOSIS — F03918 Unspecified dementia, unspecified severity, with other behavioral disturbance: Secondary | ICD-10-CM | POA: Diagnosis not present

## 2023-03-29 DIAGNOSIS — K5750 Diverticulosis of both small and large intestine without perforation or abscess without bleeding: Secondary | ICD-10-CM | POA: Diagnosis not present

## 2023-03-29 DIAGNOSIS — A0811 Acute gastroenteropathy due to Norwalk agent: Secondary | ICD-10-CM | POA: Diagnosis present

## 2023-03-29 DIAGNOSIS — E7800 Pure hypercholesterolemia, unspecified: Secondary | ICD-10-CM | POA: Diagnosis present

## 2023-03-29 DIAGNOSIS — I130 Hypertensive heart and chronic kidney disease with heart failure and stage 1 through stage 4 chronic kidney disease, or unspecified chronic kidney disease: Secondary | ICD-10-CM | POA: Diagnosis present

## 2023-03-29 DIAGNOSIS — Z66 Do not resuscitate: Secondary | ICD-10-CM | POA: Diagnosis present

## 2023-03-29 DIAGNOSIS — N179 Acute kidney failure, unspecified: Secondary | ICD-10-CM | POA: Diagnosis present

## 2023-03-29 DIAGNOSIS — I509 Heart failure, unspecified: Secondary | ICD-10-CM | POA: Diagnosis not present

## 2023-03-29 DIAGNOSIS — Z8616 Personal history of COVID-19: Secondary | ICD-10-CM | POA: Diagnosis not present

## 2023-03-29 DIAGNOSIS — Z7401 Bed confinement status: Secondary | ICD-10-CM | POA: Diagnosis not present

## 2023-03-29 DIAGNOSIS — I4891 Unspecified atrial fibrillation: Secondary | ICD-10-CM | POA: Diagnosis not present

## 2023-03-29 DIAGNOSIS — K7689 Other specified diseases of liver: Secondary | ICD-10-CM | POA: Diagnosis not present

## 2023-03-29 DIAGNOSIS — Z79899 Other long term (current) drug therapy: Secondary | ICD-10-CM | POA: Diagnosis not present

## 2023-03-29 DIAGNOSIS — E8770 Fluid overload, unspecified: Secondary | ICD-10-CM | POA: Diagnosis not present

## 2023-03-29 DIAGNOSIS — Z7901 Long term (current) use of anticoagulants: Secondary | ICD-10-CM | POA: Diagnosis not present

## 2023-03-29 DIAGNOSIS — Z79890 Hormone replacement therapy (postmenopausal): Secondary | ICD-10-CM | POA: Diagnosis not present

## 2023-03-29 DIAGNOSIS — I110 Hypertensive heart disease with heart failure: Secondary | ICD-10-CM | POA: Diagnosis not present

## 2023-03-29 DIAGNOSIS — E039 Hypothyroidism, unspecified: Secondary | ICD-10-CM | POA: Diagnosis not present

## 2023-03-29 DIAGNOSIS — J811 Chronic pulmonary edema: Secondary | ICD-10-CM | POA: Diagnosis not present

## 2023-03-29 DIAGNOSIS — I5032 Chronic diastolic (congestive) heart failure: Secondary | ICD-10-CM | POA: Diagnosis present

## 2023-03-29 DIAGNOSIS — E876 Hypokalemia: Secondary | ICD-10-CM | POA: Diagnosis present

## 2023-03-29 DIAGNOSIS — E872 Acidosis, unspecified: Secondary | ICD-10-CM | POA: Diagnosis present

## 2023-03-29 DIAGNOSIS — N1832 Chronic kidney disease, stage 3b: Secondary | ICD-10-CM | POA: Diagnosis present

## 2023-03-29 DIAGNOSIS — G9341 Metabolic encephalopathy: Secondary | ICD-10-CM | POA: Diagnosis present

## 2023-03-29 DIAGNOSIS — E890 Postprocedural hypothyroidism: Secondary | ICD-10-CM | POA: Diagnosis present

## 2023-03-29 LAB — CBC
HCT: 31.9 % — ABNORMAL LOW (ref 36.0–46.0)
Hemoglobin: 10.1 g/dL — ABNORMAL LOW (ref 12.0–15.0)
MCH: 32 pg (ref 26.0–34.0)
MCHC: 31.7 g/dL (ref 30.0–36.0)
MCV: 100.9 fL — ABNORMAL HIGH (ref 80.0–100.0)
Platelets: 245 10*3/uL (ref 150–400)
RBC: 3.16 MIL/uL — ABNORMAL LOW (ref 3.87–5.11)
RDW: 15 % (ref 11.5–15.5)
WBC: 10.2 10*3/uL (ref 4.0–10.5)
nRBC: 0 % (ref 0.0–0.2)

## 2023-03-29 LAB — BLOOD GAS, VENOUS
Acid-base deficit: 1.1 mmol/L (ref 0.0–2.0)
Bicarbonate: 24.3 mmol/L (ref 20.0–28.0)
O2 Saturation: 57.9 %
Patient temperature: 37
pCO2, Ven: 42 mmHg — ABNORMAL LOW (ref 44–60)
pH, Ven: 7.37 (ref 7.25–7.43)
pO2, Ven: 33 mmHg (ref 32–45)

## 2023-03-29 LAB — BASIC METABOLIC PANEL
Anion gap: 10 (ref 5–15)
BUN: 34 mg/dL — ABNORMAL HIGH (ref 8–23)
CO2: 23 mmol/L (ref 22–32)
Calcium: 7.5 mg/dL — ABNORMAL LOW (ref 8.9–10.3)
Chloride: 105 mmol/L (ref 98–111)
Creatinine, Ser: 1.2 mg/dL — ABNORMAL HIGH (ref 0.44–1.00)
GFR, Estimated: 41 mL/min — ABNORMAL LOW (ref >60)
Glucose, Bld: 110 mg/dL — ABNORMAL HIGH (ref 70–99)
Potassium: 3.5 mmol/L (ref 3.5–5.1)
Sodium: 138 mmol/L (ref 135–145)

## 2023-03-29 LAB — PROCALCITONIN: Procalcitonin: 0.16 ng/mL

## 2023-03-29 LAB — C DIFFICILE QUICK SCREEN W PCR REFLEX
C Diff antigen: NEGATIVE
C Diff interpretation: NOT DETECTED
C Diff toxin: NEGATIVE

## 2023-03-29 LAB — MRSA NEXT GEN BY PCR, NASAL: MRSA by PCR Next Gen: NOT DETECTED

## 2023-03-29 LAB — LACTIC ACID, PLASMA
Lactic Acid, Venous: 6.1 mmol/L (ref 0.5–1.9)
Lactic Acid, Venous: 5.2 mmol/L (ref 0.5–1.9)
Lactic Acid, Venous: 1.7 mmol/L (ref 0.5–1.9)

## 2023-03-29 LAB — MAGNESIUM: Magnesium: 1.5 mg/dL — ABNORMAL LOW (ref 1.7–2.4)

## 2023-03-29 LAB — GLUCOSE, CAPILLARY: Glucose-Capillary: 100 mg/dL — ABNORMAL HIGH (ref 70–99)

## 2023-03-29 LAB — PHOSPHORUS: Phosphorus: 3.5 mg/dL (ref 2.5–4.6)

## 2023-03-29 MED ORDER — MELATONIN 5 MG PO TABS
5.0000 mg | ORAL_TABLET | Freq: Every day | ORAL | Status: DC
  Administered 2023-03-29 – 2023-03-31 (×3): 5 mg via ORAL
  Filled 2023-03-29 (×3): qty 1

## 2023-03-29 MED ORDER — LEVOTHYROXINE SODIUM 112 MCG PO TABS
112.0000 ug | ORAL_TABLET | Freq: Every day | ORAL | Status: DC
  Administered 2023-03-29 – 2023-04-01 (×2): 112 ug via ORAL
  Filled 2023-03-29 (×2): qty 1

## 2023-03-29 MED ORDER — VANCOMYCIN HCL 750 MG/150ML IV SOLN: 750.0000 mg | INTRAVENOUS | Status: DC

## 2023-03-29 MED ORDER — LACTATED RINGERS IV SOLN: INTRAVENOUS | Status: DC

## 2023-03-29 MED ORDER — LORAZEPAM 2 MG/ML IJ SOLN
0.5000 mg | Freq: Four times a day (QID) | INTRAMUSCULAR | Status: DC | PRN
  Administered 2023-03-29 – 2023-03-30 (×3): 0.5 mg via INTRAVENOUS
  Filled 2023-03-29 (×3): qty 1

## 2023-03-29 MED ORDER — DONEPEZIL HCL 10 MG PO TABS
10.0000 mg | ORAL_TABLET | Freq: Every day | ORAL | Status: DC
  Administered 2023-03-29 – 2023-04-01 (×3): 10 mg via ORAL
  Filled 2023-03-29 (×4): qty 1

## 2023-03-29 MED ORDER — PANTOPRAZOLE SODIUM 40 MG PO TBEC
40.0000 mg | DELAYED_RELEASE_TABLET | Freq: Every day | ORAL | Status: DC
  Administered 2023-03-29 – 2023-04-01 (×3): 40 mg via ORAL
  Filled 2023-03-29 (×4): qty 1

## 2023-03-29 MED ORDER — DULOXETINE HCL 30 MG PO CPEP
60.0000 mg | ORAL_CAPSULE | Freq: Every day | ORAL | Status: DC
  Administered 2023-03-29 – 2023-04-01 (×3): 60 mg via ORAL
  Filled 2023-03-29 (×5): qty 2

## 2023-03-29 MED ORDER — IOHEXOL 300 MG/ML  SOLN
80.0000 mL | Freq: Once | INTRAMUSCULAR | Status: AC | PRN
  Administered 2023-03-29: 80 mL via INTRAVENOUS

## 2023-03-29 MED ORDER — ATORVASTATIN CALCIUM 40 MG PO TABS
40.0000 mg | ORAL_TABLET | Freq: Every day | ORAL | Status: DC
  Administered 2023-03-29 – 2023-04-01 (×3): 40 mg via ORAL
  Filled 2023-03-29 (×4): qty 1

## 2023-03-29 MED ORDER — PROCHLORPERAZINE EDISYLATE 10 MG/2ML IJ SOLN
5.0000 mg | Freq: Four times a day (QID) | INTRAMUSCULAR | Status: DC | PRN
  Administered 2023-03-31 (×2): 5 mg via INTRAVENOUS
  Filled 2023-03-29 (×2): qty 2

## 2023-03-29 MED ORDER — MAGNESIUM SULFATE 4 GM/100ML IV SOLN
4.0000 g | Freq: Once | INTRAVENOUS | Status: AC
  Administered 2023-03-29: 4 g via INTRAVENOUS
  Filled 2023-03-29: qty 100

## 2023-03-29 MED ORDER — SODIUM CHLORIDE 0.9 % IV SOLN: 2.0000 g | INTRAVENOUS | Status: DC

## 2023-03-29 MED ORDER — IPRATROPIUM-ALBUTEROL 0.5-2.5 (3) MG/3ML IN SOLN: 3.0000 mL | RESPIRATORY_TRACT | Status: DC | PRN

## 2023-03-29 MED ORDER — POLYVINYL ALCOHOL 1.4 % OP SOLN
1.0000 [drp] | OPHTHALMIC | Status: DC | PRN
  Filled 2023-03-29: qty 15

## 2023-03-29 MED ORDER — MIDODRINE HCL 5 MG PO TABS: 5.0000 mg | ORAL_TABLET | Freq: Three times a day (TID) | ORAL | Status: DC

## 2023-03-29 MED ORDER — ACETAMINOPHEN 325 MG PO TABS
650.0000 mg | ORAL_TABLET | Freq: Four times a day (QID) | ORAL | Status: DC | PRN
  Filled 2023-03-29: qty 2

## 2023-03-29 MED ORDER — MIDODRINE HCL 5 MG PO TABS: 5.0000 mg | ORAL_TABLET | ORAL | Status: DC

## 2023-03-29 MED ORDER — CHLORHEXIDINE GLUCONATE CLOTH 2 % EX PADS
6.0000 | MEDICATED_PAD | Freq: Every day | CUTANEOUS | Status: DC
  Administered 2023-03-29 – 2023-04-01 (×4): 6 via TOPICAL

## 2023-03-29 MED ORDER — SODIUM CHLORIDE 0.9 % IV SOLN
3.0000 g | Freq: Two times a day (BID) | INTRAVENOUS | Status: DC
  Administered 2023-03-29: 3 g via INTRAVENOUS
  Filled 2023-03-29: qty 8

## 2023-03-29 MED ORDER — APIXABAN 2.5 MG PO TABS
2.5000 mg | ORAL_TABLET | Freq: Two times a day (BID) | ORAL | Status: DC
  Administered 2023-03-29 – 2023-04-01 (×6): 2.5 mg via ORAL
  Filled 2023-03-29 (×7): qty 1

## 2023-03-29 MED ORDER — LACTATED RINGERS IV BOLUS
1000.0000 mL | Freq: Once | INTRAVENOUS | Status: AC
  Administered 2023-03-29: 1000 mL via INTRAVENOUS

## 2023-03-29 MED ORDER — ACETAMINOPHEN 650 MG RE SUPP
650.0000 mg | Freq: Once | RECTAL | Status: AC
  Administered 2023-03-29: 650 mg via RECTAL
  Filled 2023-03-29: qty 1

## 2023-03-29 MED ORDER — LACTATED RINGERS IV SOLN: INTRAVENOUS | Status: AC

## 2023-03-29 MED ORDER — MELATONIN 5 MG PO TABS: 5.0000 mg | ORAL_TABLET | Freq: Every evening | ORAL | Status: DC | PRN

## 2023-03-29 NOTE — Progress Notes (Signed)
PT Cancellation Note  Patient Details Name: Erin Ryan MRN: [id number] DOB: 08-Feb-1925   Cancelled Treatment:    Reason Eval/Treat Not Completed: Medical issues which prohibited therapy; BP remains fairly soft, pt has been in hospital for 11 hrs at this time; continue efforts,  to complete PT eval however defer at this time.  Delice Bison, PT  Acute Rehab Dept [facility]) [phone]  03/29/2023    [facility] 03/29/2023, 8:52 AM

## 2023-03-29 NOTE — Progress Notes (Signed)
PHARMACY NOTE:  ANTIMICROBIAL RENAL DOSAGE ADJUSTMENT  Current antimicrobial regimen includes a mismatch between antimicrobial dosage and estimated renal function.  As per policy approved by the Pharmacy & Therapeutics and Medical Executive Committees, the antimicrobial dosage will be adjusted accordingly.  Current antimicrobial dosage:  Vancomycin 1gm IV q24h  Indication: sepsis likely from a urinary for respiratory source  Renal Function: Estimated CrCl ~2ml/min  CrCl cannot be calculated (Unknown ideal weight.). []      On intermittent HD, scheduled: []      On CRRT    Antimicrobial dosage has been changed to:   Vancomycin 750mg  IV q48h for estimated AUC 483.  Additional comments:   Thank you for allowing pharmacy to be a part of this patient's care.  Junita Push, [facility] 03/29/2023 3:15 AM

## 2023-03-29 NOTE — Progress Notes (Signed)
OT Cancellation Note  Patient Details Name: Erin Ryan MRN: [id number] DOB: Feb 17, 1925   Cancelled Treatment:    Reason Eval/Treat Not Completed: Patient not medically ready Patient asleep in bed this AM with sof bp 97/37 MAP 53. Nurse aware. OT to continue to follow and check back as schedule will allow.  Rosalio Loud, MS Acute Rehabilitation Department Office# [phone]  03/29/2023, 8:41 AM

## 2023-03-29 NOTE — ED Provider Notes (Signed)
Care assumed from Dr. Doran Durand.  Patient with dementia here with increased confusion, diarrhea, abdominal pain, recently treated for UTI.  Being treated for sepsis given tachycardia, fever, elevated lactate.  Pending CT abdomen pelvis.  Questionable UTI, questionable pneumonia.  CT abdomen pelvis does not show any acute surgical pathology.  Lactate is increased from 4.3-6.1.  Additional IV fluids given  Continue to treat sepsis with possible urinary or pulmonary source.  Additional IV fluids ordered.  Did receive broad-spectrum antibiotics.  Admission discussed with Dr. Margo Aye.   Glynn Octave, MD 03/29/23 [phone]

## 2023-03-29 NOTE — H&P (Signed)
History and Physical  Erin Ryan ZOX:[id number] DOB: 06/12/1925 DOA: 03/28/2023  Referring physician: Dr. Manus Gunning, EDP  PCP: Erin Montana, MD  Outpatient Specialists: Cardiology. Patient coming from: Home lives with her daughter.  Chief Complaint: Altered mental status.  HPI: Erin Ryan is a 89 y.o. female with medical history significant for recurrent UTIs, CKD 3B, chronic HFpEF, paroxysmal A-fib on Eliquis, hypertension, hyperlipidemia, hypothyroidism, GERD, chronic anxiety/depression, dementia, who presented with altered mental status that has been progressively worsening.  Associated with 1 episode of watery stool, nausea, vomiting, and chills.  EMS was activated and the patient was presented to the ED for further evaluation.  In the ED, code sepsis was called due to tachycardia with heart rate of 108, tachypnea with respiratory rate of 24 and UA positive for pyuria.  Lactic acidosis 4.3, repeat 6.1.  Reportedly the patient was recently treated with 2 antibiotics for UTI.  Started on broad-spectrum IV antibiotics cefepime and IV vancomycin.  C. difficile PCR ordered and pending as the patient has not had a bowel movement while in the ED.  Chest x-ray revealed left lower lobe infiltrates versus edema.  Additionally, the patient received 3 L of IV fluid boluses LR.  Also received 1 dose of IV Zofran 4 mg x 1.  TRH, hospitalist service, was asked to admit..  Admitted to stepdown unit as inpatient status for severe sepsis secondary to UTI and possibly left lower lobe pneumonia seen on chest x-ray.  ED Course: Tmax 102.4.  BP 139/113, pulse 104, respiratory 23, O2 saturation 92% on room air.  Lab studies notable for WBC 10.2, hemoglobin 12.2, neutrophil 9.3.  Serum glucose 153, creatinine 1.46, albumin 3.0, GFR 33, lactic acid 4.3, repeat 6.1.  Review of Systems: Review of systems as noted in the HPI. All other systems reviewed and are negative.   Past Medical History:  Diagnosis Date    A-fib [facility])    Anxiety    Arthritis    COVID-19    04/2021   Dementia (HCC)    GERD (gastroesophageal reflux disease)    Hearing loss    has hearing aids   High cholesterol    Hypertension    Hypothyroid    Osteoporosis    Scoliosis    Thyroid disease    Past Surgical History:  Procedure Laterality Date   CATARACT EXTRACTION     cysts removed from bilateral breasts     THYROIDECTOMY      Social History:  reports that she has never smoked. She has never used smokeless tobacco. She reports that she does not drink alcohol and does not use drugs.   Allergies  Allergen Reactions   Ambrosia Trifida (Tall Ragweed) Allergy Skin Test Other (See Comments)   Hydrochlorothiazide Other (See Comments)    hyponatremia   Lyrica [Pregabalin] Other (See Comments)    Severe confusion   Diltiazem Hcl Er Rash   Short Ragweed Pollen Ext Other (See Comments)    Family History  Problem Relation Age of Onset   Heart disease Mother 38       Heart attack   Bladder Cancer Mother    Heart disease Father    Asthma Father    Heart attack Brother        Age undetermined      Prior to Admission medications   Medication Sig Start Date End Date Taking? Authorizing Provider  Acetaminophen (TYLENOL EXTRA STRENGTH PO) Take 1,000 mg by mouth 3 (three) times daily.    [provider]  amLODipine (NORVASC) 2.5 MG tablet Take 1 tablet (2.5 mg total) by mouth daily. 12/19/22   Ryan, Mihai, MD  apixaban (ELIQUIS) 2.5 MG TABS tablet Take 1 tablet (2.5 mg total) by mouth 2 (two) times daily. 11/28/22   Ryan, Mihai, MD  bethanechol (URECHOLINE) 10 MG tablet Take 1 tablet (10 mg total) by mouth 3 (three) times daily. 12/23/22   Almon Hercules, MD  Calcium Citrate-Vitamin D (CALCIUM CITRATE + PO) Take 1 tablet 2 (two) times daily by mouth.    [provider]  denosumab (PROLIA) 60 MG/ML SOSY injection Inject 60 mg into the skin every 6 (six) months. 06/24/17   [provider]   donepezil (ARICEPT) 10 MG tablet Take 1 tablet (10 mg total) by mouth at bedtime. 05/20/22   Erin Penny, NP  DULoxetine (CYMBALTA) 30 MG capsule Take 30 mg by mouth daily. 09/06/21   [provider]  furosemide (LASIX) 20 MG tablet Take 2 tablets (40 mg total) by mouth daily. 01/29/23   Rollene Rotunda, MD  gabapentin (NEURONTIN) 100 MG capsule Take 200 mg by mouth at bedtime. 05/07/22   [provider]  lactose free nutrition (BOOST PLUS) LIQD Take 237 mLs by mouth 2 (two) times daily between meals. 08/19/22   Erin Bong, MD  levothyroxine (SYNTHROID) 100 MCG tablet Take 1 tablet (100 mcg total) by mouth in the morning. DAW1 Synthroid 08/19/22   Erin Bong, MD  levothyroxine (SYNTHROID) 112 MCG tablet Take 112 mcg by mouth daily before breakfast. 01/20/23   [provider]  Metoprolol Tartrate 75 MG TABS Take 1 tablet (75 mg total) by mouth 2 (two) times daily. 11/28/22   Ryan, Mihai, MD  mirtazapine (REMERON) 7.5 MG tablet Take 1 tablet (7.5 mg total) by mouth at bedtime. 12/23/22   Almon Hercules, MD  Multiple Vitamins-Minerals (CENTRUM SILVER PO) Take 1 tablet by mouth daily.    [provider]  omeprazole (PRILOSEC) 40 MG capsule Take 1 capsule (40 mg total) by mouth 2 (two) times daily before a meal. 09/11/17   Erin Cowden, MD  polyvinyl alcohol (LIQUIFILM TEARS) 1.4 % ophthalmic solution Place 1 drop into both eyes as needed for dry eyes. 08/19/22   Erin Bong, MD    Physical Exam: BP (!) 134/58   Pulse (!) 108   Temp 98.7 F (37.1 C) (Oral)   Resp (!) 24   SpO2 96%   General: 89 y.o. year-old female well developed well nourished in no acute distress.  Alert and altered.  Hard of hearing Cardiovascular: Regular rate and rhythm with no rubs or gallops.  No thyromegaly or JVD noted.  Trace lower extremity edema bilaterally. Respiratory: Mild rales at bases.  Poor inspiratory effort. Abdomen: Soft nontender nondistended  with normal bowel sounds x4 quadrants. Muskuloskeletal: No cyanosis or clubbing noted bilaterally Neuro: CN II-XII intact, strength, sensation, reflexes Skin: No ulcerative lesions noted or rashes Psychiatry: Judgement and insight appear altered. Mood is appropriate for condition and setting          Labs on Admission:  Basic Metabolic Panel: Recent Labs  Lab 03/28/23 2118  NA 140  K 3.8  CL 103  CO2 22  GLUCOSE 153*  BUN 39*  CREATININE 1.46*  CALCIUM 8.2*   Liver Function Tests: Recent Labs  Lab 03/28/23 2118  AST 34  ALT 25  ALKPHOS 69  BILITOT 0.5  PROT 7.1  ALBUMIN 3.0*   No results for  input(s): "LIPASE", "AMYLASE" in the last 168 hours. No results for input(s): "AMMONIA" in the last 168 hours. CBC: Recent Labs  Lab 03/28/23 2118  WBC 10.2  NEUTROABS 9.3*  HGB 12.2  HCT 38.7  MCV 97.5  PLT 301   Cardiac Enzymes: No results for input(s): "CKTOTAL", "CKMB", "CKMBINDEX", "TROPONINI" in the last 168 hours.  BNP (last 3 results) Recent Labs    12/19/22 1752 01/12/23 1549 01/20/23 1259  BNP 321.4* 232.8* 199.5*    ProBNP (last 3 results) No results for input(s): "PROBNP" in the last 8760 hours.  CBG: No results for input(s): "GLUCAP" in the last 168 hours.  Radiological Exams on Admission: CT ABDOMEN PELVIS W CONTRAST  Result Date: 03/29/2023 CLINICAL DATA:  Abdominal pain EXAM: CT ABDOMEN AND PELVIS WITH CONTRAST TECHNIQUE: Multidetector CT imaging of the abdomen and pelvis was performed using the standard protocol following bolus administration of intravenous contrast. RADIATION DOSE REDUCTION: This exam was performed according to the departmental dose-optimization program which includes automated exposure control, adjustment of the mA and/or kV according to patient size and/or use of iterative reconstruction technique. CONTRAST:  80mL OMNIPAQUE IOHEXOL 300 MG/ML  SOLN COMPARISON:  CT chest abdomen and pelvis 03/01/2022 FINDINGS: Lower chest: There  are small bilateral pleural effusions with bibasilar atelectasis. Hepatobiliary: There is an unchanged cyst in the left lobe of the liver measuring 2 cm. Gallbladder and bile ducts are within normal limits. Pancreas: Unremarkable. No pancreatic ductal dilatation or surrounding inflammatory changes. Spleen: Normal in size without focal abnormality. Adrenals/Urinary Tract: Left renal atrophy is unchanged. Right kidney within normal limits. Adrenal glands are within normal limits. Small posterior right bladder diverticulum is unchanged. No bladder calculi. Stomach/Bowel: There is a small hiatal hernia. Stomach is within normal limits. Appendix appears normal. No evidence of bowel wall thickening, distention, or inflammatory changes. There is sigmoid colon diverticulosis. There also scattered small bowel diverticula in the lower abdomen. Vascular/Lymphatic: Aortic atherosclerosis. No enlarged abdominal or pelvic lymph nodes. Reproductive: Uterus and bilateral adnexa are unremarkable. Other: No abdominal wall hernia or abnormality. No abdominopelvic ascites. Musculoskeletal: Severe degenerative changes affect the spine. IMPRESSION: 1. No acute localizing process in the abdomen or pelvis. 2. Small bilateral pleural effusions with bibasilar atelectasis. 3. Colonic and small bowel diverticulosis without evidence for diverticulitis. 4. Stable left renal atrophy. Aortic Atherosclerosis (ICD10-I70.0). Electronically Signed   By: Darliss Cheney M.D.   On: 03/29/2023 00:24   DG Chest Portable 1 View  Result Date: 03/28/2023 CLINICAL DATA:  Fever, altered mental status. EXAM: PORTABLE CHEST 1 VIEW COMPARISON:  12/19/2022. FINDINGS: The heart is enlarged and the mediastinal contour is within normal limits. There is atherosclerotic calcification of the aorta. Lung volumes are low. There is a small left pleural effusion with airspace in the mid to lower left lung field. No pneumothorax. No acute osseous abnormality. IMPRESSION: 1.  Airspace disease in the mid to lower left lung, possible edema or infiltrate. 2. Small left pleural effusion. Electronically Signed   By: Thornell Sartorius M.D.   On: 03/28/2023 22:30    EKG: I independently viewed the EKG done and my findings are as followed: Atrial fibrillation rate of 104.  Nonspecific ST-T changes.  QTc 467.  Assessment/Plan Present on Admission:  Severe sepsis (HCC)  Principal Problem:   Severe sepsis (HCC)  Severe sepsis secondary to UTI and possible left lower lobe pneumonia, both POA Tachycardia, tachypnea, UA positive for pyuria, left lower lobe infiltrates versus edema on chest x-ray. History of recurrent  UTIs, recently treated with 2 antibiotics. Obtain urine culture for ID and sensitivities Obtain baseline procalcitonin Follow peripheral blood cultures x 2. Narrow down antibiotics when able Monitor fever curve and WBCs Maintain MAP greater than 65 Closely monitor volume status while on IV fluid.  Currently on LR 50 cc/h x 12 hours.  Possible left lower lobe pneumonia, seen on chest x-ray, POA Obtain baseline procalcitonin Monitor fever curve and WBC Continue IV antibiotics initiated in the ED, IV vancomycin and cefepime Maintain O2 saturation above 92% Incentive spirometer Bronchodilators as needed for shortness of breath and wheezing.  Paroxysmal A-fib with RVR, on Eliquis Resume home Eliquis Monitor on telemetry  Acute watery stool x 1, unclear etiology Follow C. difficile PCR, ordered by EDP No recurrence in the ED thus far.  Chronic HFpEF Hold off home diuretics Euvolemic on exam. Closely monitor volume status while on IV fluid Has received 3 L in the ED as part of sepsis protocol. Start strict I's and O's and daily weight  Hypothyroidism Resume home levothyroxine  GERD Resume home omeprazole  Chronic anxiety/depression Resume home regimen  CKD 3B Appears to be at her baseline renal function Continue to avoid nephrotoxic agents,  dehydration, and hypotension Monitor urine output Repeat BMP in the morning  Physical debility Goals of care PT OT assessment Fall precautions Palliative care team consulted to assist with establishing goals of care.   Time: 75 minutes.   DVT prophylaxis: Home Eliquis  Code Status: DNR per the patient's daughter at bedside.  Family Communication: Updated the patient's daughter at bedside  Disposition Plan: Admitted to stepdown unit  Consults called: None  Admission status: Inpatient status   Status is: Inpatient The patient requires at least 2 midnights for further evaluation and treatment of present condition.   Darlin Drop MD [facility] Pager [phone]  If 7PM-7AM, please contact night-coverage [email or web address] Password [email or web address]  03/29/2023, 12:49 AM

## 2023-03-29 NOTE — Progress Notes (Signed)
Notified provider of need to order repeat lactic acid d/t increase from 4.3->6.1.  Orders placed.

## 2023-03-29 NOTE — ED Notes (Signed)
Patient transported to CT 

## 2023-03-29 NOTE — ED Notes (Signed)
ED TO INPATIENT HANDOFF REPORT  ED Nurse Name and Phone #: Claiborne Rigg Name/Age/Gender Erin Ryan 89 y.o. female Room/Bed: WA03/WA03  Code Status   Code Status: DNR  Home/SNF/Other Home Patient oriented to: self Is this baseline? Yes   Triage Complete: Triage complete  Chief Complaint Severe sepsis (HCC) [A41.9, R65.20]  Triage Note Pt arrives via EMS from home where she lives with her daughter. Pt has dementia with confusion at baseline, pt alert to name, knows she is in a hospital in [place] but confused to all other details at this time. Pt dry heaving in stretcher during triage assessment, dried emesis present on pt pj top. EMS reports pt was previously treated with two different antibiotics, EMS reports "we did get a whiff when we scooted her over that smelled like C. Diff." Pt placed on enteric contact precautions at this time until able to test pt for C. Diff. Pt daughter reportedly on the way but not present during triage. Pt currently just tremulous and reports "I'm about to freeze to death." Pt other denies any c/o pain currently.    Allergies Allergies  Allergen Reactions   Ambrosia Trifida (Tall Ragweed) Allergy Skin Test Other (See Comments)   Hydrochlorothiazide Other (See Comments)    hyponatremia   Lyrica [Pregabalin] Other (See Comments)    Severe confusion   Diltiazem Hcl Er Rash   Short Ragweed Pollen Ext Other (See Comments)    Level of Care/Admitting Diagnosis ED Disposition     ED Disposition  Admit   Condition  --   Comment  Hospital Area: [facility] [[id number]]  Level of Care: Stepdown [14]  Admit to SDU based on following criteria: Severe physiological/psychological symptoms:  Any diagnosis requiring assessment & intervention at least every 4 hours on an ongoing basis to obtain desired patient outcomes including stability and rehabilitation  Admit to SDU based on following criteria: Hemodynamic compromise or  significant risk of instability:  Patient requiring short term acute titration and management of vasoactive drips, and invasive monitoring (i.e., CVP and Arterial line).  May admit patient to [facility] or [facility] if equivalent level of care is available:: Yes  Covid Evaluation: Asymptomatic - no recent exposure (last 10 days) testing not required  Diagnosis: Severe sepsis [facility]) [[id number]]  Admitting Physician: Darlin Drop [[id number]]  Attending Physician: Darlin Drop [[id number]]  Certification:: I certify this patient will need inpatient services for at least 2 midnights  Estimated Length of Stay: 2          B Medical/Surgery History Past Medical History:  Diagnosis Date   A-fib (HCC)    Anxiety    Arthritis    COVID-19    04/2021   Dementia (HCC)    GERD (gastroesophageal reflux disease)    Hearing loss    has hearing aids   High cholesterol    Hypertension    Hypothyroid    Osteoporosis    Scoliosis    Thyroid disease    Past Surgical History:  Procedure Laterality Date   CATARACT EXTRACTION     cysts removed from bilateral breasts     THYROIDECTOMY       A IV Location/Drains/Wounds Patient Lines/Drains/Airways Status     Active Line/Drains/Airways     Name Placement date Placement time Site Days   Peripheral IV 03/28/23 20 G 1" Distal;Posterior;Right Forearm 03/28/23  2125  Forearm  1   Peripheral IV 03/28/23 22 G 1" Anterior;Right Forearm 03/28/23  2154  Forearm  1   Peripheral IV 03/28/23 20 G 1" Anterior;Left Forearm 03/28/23  2349  Forearm  1            Intake/Output Last 24 hours  Intake/Output Summary (Last 24 hours) at 03/29/2023 0426 Last data filed at 03/29/2023 0145 Gross per 24 hour  Intake 3350 ml  Output --  Net 3350 ml    Labs/Imaging Results for orders placed or performed during the hospital encounter of 03/28/23 (from the past 48 hour(s))  Procalcitonin     Status: None   Collection Time: 03/28/23  9:16 PM  Result Value Ref  Range   Procalcitonin 0.16 ng/mL    Comment:        Interpretation: PCT (Procalcitonin) <= 0.5 ng/mL: Systemic infection (sepsis) is not likely. Local bacterial infection is possible. (NOTE)       Sepsis PCT Algorithm           Lower Respiratory Tract                                      Infection PCT Algorithm    ----------------------------     ----------------------------         PCT < 0.25 ng/mL                PCT < 0.10 ng/mL          Strongly encourage             Strongly discourage   discontinuation of antibiotics    initiation of antibiotics    ----------------------------     -----------------------------       PCT 0.25 - 0.50 ng/mL            PCT 0.10 - 0.25 ng/mL               OR       >80% decrease in PCT            Discourage initiation of                                            antibiotics      Encourage discontinuation           of antibiotics    ----------------------------     -----------------------------         PCT >= 0.50 ng/mL              PCT 0.26 - 0.50 ng/mL               AND        <80% decrease in PCT             Encourage initiation of                                             antibiotics       Encourage continuation           of antibiotics    ----------------------------     -----------------------------        PCT >= 0.50 ng/mL  PCT > 0.50 ng/mL               AND         increase in PCT                  Strongly encourage                                      initiation of antibiotics    Strongly encourage escalation           of antibiotics                                     -----------------------------                                           PCT <= 0.25 ng/mL                                                 OR                                        > 80% decrease in PCT                                      Discontinue / Do not initiate                                             antibiotics  Performed at [facility], [address] [place], [place] [address]   CBC with Differential     Status: Abnormal   Collection Time: 03/28/23  9:18 PM  Result Value Ref Range   WBC 10.2 4.0 - 10.5 K/uL   RBC 3.97 3.87 - 5.11 MIL/uL   Hemoglobin 12.2 12.0 - 15.0 g/dL   HCT 60.4 54.0 - 98.1 %   MCV 97.5 80.0 - 100.0 fL   MCH 30.7 26.0 - 34.0 pg   MCHC 31.5 30.0 - 36.0 g/dL   RDW 19.1 47.8 - 29.5 %   Platelets 301 150 - 400 K/uL   nRBC 0.0 0.0 - 0.2 %   Neutrophils Relative % 91 %   Neutro Abs 9.3 (H) 1.7 - 7.7 K/uL   Lymphocytes Relative 5 %   Lymphs Abs 0.5 (L) 0.7 - 4.0 K/uL   Monocytes Relative 3 %   Monocytes Absolute 0.3 0.1 - 1.0 K/uL   Eosinophils Relative 0 %   Eosinophils Absolute 0.0 0.0 - 0.5 K/uL   Basophils Relative 0 %   Basophils Absolute 0.0 0.0 - 0.1 K/uL   Immature Granulocytes 1 %   Abs Immature Granulocytes 0.07 0.00 - 0.07 K/uL    Comment: Performed at [facility], [address], [place], [place] [address]  Comprehensive metabolic panel     Status: Abnormal   Collection Time: 03/28/23  9:18 PM  Result Value Ref Range   Sodium 140 135 - 145 mmol/L   Potassium 3.8 3.5 - 5.1 mmol/L   Chloride 103 98 - 111 mmol/L   CO2 22 22 - 32 mmol/L   Glucose, Bld 153 (H) 70 - 99 mg/dL    Comment: Glucose reference range applies only to samples taken after fasting for at least 8 hours.   BUN 39 (H) 8 - 23 mg/dL   Creatinine, Ser 4.09 (H) 0.44 - 1.00 mg/dL   Calcium 8.2 (L) 8.9 - 10.3 mg/dL   Total Protein 7.1 6.5 - 8.1 g/dL   Albumin 3.0 (L) 3.5 - 5.0 g/dL   AST 34 15 - 41 U/L   ALT 25 0 - 44 U/L   Alkaline Phosphatase 69 38 - 126 U/L   Total Bilirubin 0.5 0.3 - 1.2 mg/dL   GFR, Estimated 33 (L) >60 mL/min    Comment: (NOTE) Calculated using the CKD-EPI Creatinine Equation (2021)    Anion gap 15 5 - 15    Comment: Performed at [facility], [address] [place], [place] [address]  Blood culture (routine x 2)     Status: None  (Preliminary result)   Collection Time: 03/28/23  9:18 PM   Specimen: BLOOD RIGHT FOREARM  Result Value Ref Range   Specimen Description      BLOOD RIGHT FOREARM Performed at [facility], [address] [place], [place] [address]    Special Requests      Blood Culture adequate volume BOTTLES DRAWN AEROBIC AND ANAEROBIC Performed at [facility], [address] [place], [place] [address]    Culture PENDING    Report Status PENDING   Lactic acid, plasma     Status: Abnormal   Collection Time: 03/28/23  9:18 PM  Result Value Ref Range   Lactic Acid, Venous 4.3 (HH) 0.5 - 1.9 mmol/L    Comment: CRITICAL RESULT CALLED TO, READ BACK BY AND VERIFIED WITH Nathanial Millman RN @ 2200 03/28/23 MCLEAN K. Performed at [facility], [address], [place], [place] [address]   Urinalysis, Routine w reflex microscopic -Urine, Catheterized     Status: Abnormal   Collection Time: 03/28/23  9:43 PM  Result Value Ref Range   Color, Urine YELLOW YELLOW   APPearance HAZY (A) CLEAR   Specific Gravity, Urine 1.020 1.005 - 1.030   pH 5.0 5.0 - 8.0   Glucose, UA NEGATIVE NEGATIVE mg/dL   Hgb urine dipstick NEGATIVE NEGATIVE   Bilirubin Urine NEGATIVE NEGATIVE   Ketones, ur NEGATIVE NEGATIVE mg/dL   Protein, ur 30 (A) NEGATIVE mg/dL   Nitrite NEGATIVE NEGATIVE   Leukocytes,Ua MODERATE (A) NEGATIVE   RBC / HPF 0-5 0 - 5 RBC/hpf   WBC, UA >50 0 - 5 WBC/hpf   Bacteria, UA MANY (A) NONE SEEN   Squamous Epithelial / HPF 0-5 0 - 5 /HPF   Mucus PRESENT     Comment: Performed at [facility], [address], [place], [place] [address]  Culture, blood (Routine X 2) w Reflex to ID Panel     Status: None (Preliminary result)   Collection Time: 03/28/23 10:01 PM   Specimen: BLOOD RIGHT FOREARM  Result Value Ref Range   Specimen Description      BLOOD RIGHT FOREARM Performed at [facility], [address], [place], [place] [address]     Special Requests      BOTTLES DRAWN AEROBIC AND ANAEROBIC Blood Culture adequate volume Performed at [facility], [address] [place], [place] [address]    Culture PENDING    Report Status PENDING   Lactic acid, plasma     Status: Abnormal   Collection Time: 03/28/23 11:25 PM  Result Value Ref Range   Lactic Acid, Venous 6.1 (HH) 0.5 - 1.9 mmol/L    Comment: CRITICAL RESULT CALLED TO, READ BACK BY AND VERIFIED WITH Reuel Derby RN @ 0018 03/29/23 MCLEAN K. Performed at [facility], [address], [place], [place] [address]   Lactic acid, plasma     Status: Abnormal   Collection Time: 03/29/23  2:10 AM  Result Value Ref Range   Lactic Acid, Venous 5.2 (HH) 0.5 - 1.9 mmol/L    Comment: CRITICAL VALUE NOTED. VALUE IS CONSISTENT WITH PREVIOUSLY REPORTED/CALLED VALUE Performed at [facility], [address] [place], [place] [address]   MRSA Next Gen by PCR, Nasal     Status: None   Collection Time: 03/29/23  3:05 AM   Specimen: Nasal Mucosa; Nasal Swab  Result Value Ref Range   MRSA by PCR Next Gen NOT DETECTED NOT DETECTED    Comment: (NOTE) The GeneXpert MRSA Assay (FDA approved for NASAL specimens only), is one component of a comprehensive MRSA colonization surveillance program. It is not intended to diagnose MRSA infection nor to guide or monitor treatment for MRSA infections. Test performance is not FDA approved in patients less than 29 years old. Performed at [facility], [address] [place], [place] [address]   Blood gas, venous     Status: Abnormal   Collection Time: 03/29/23  3:05 AM  Result Value Ref Range   pH, Ven 7.37 7.25 - 7.43   pCO2, Ven 42 (L) 44 - 60 mmHg   pO2, Ven 33 32 - 45 mmHg   Bicarbonate 24.3 20.0 - 28.0 mmol/L   Acid-base deficit 1.1 0.0 - 2.0 mmol/L   O2 Saturation 57.9 %   Patient temperature 37.0     Comment: Performed at [facility], [address] [place], [place] [address]   CT ABDOMEN PELVIS W CONTRAST  Result Date: 03/29/2023 CLINICAL DATA:  Abdominal pain EXAM: CT ABDOMEN AND PELVIS WITH CONTRAST TECHNIQUE: Multidetector CT imaging of the abdomen and pelvis was performed using the standard protocol following bolus administration of intravenous contrast. RADIATION DOSE REDUCTION: This exam was performed according to the departmental dose-optimization program which includes automated exposure control, adjustment of the mA and/or kV according to patient size and/or use of iterative reconstruction technique. CONTRAST:  80mL OMNIPAQUE IOHEXOL 300 MG/ML  SOLN COMPARISON:  CT chest abdomen and pelvis 03/01/2022 FINDINGS: Lower chest: There are small bilateral pleural effusions with bibasilar atelectasis. Hepatobiliary: There is an unchanged cyst in the left lobe of the liver measuring 2 cm. Gallbladder and bile ducts are within normal limits. Pancreas: Unremarkable. No pancreatic ductal dilatation or surrounding inflammatory changes. Spleen: Normal in size without focal abnormality. Adrenals/Urinary Tract: Left renal atrophy is unchanged. Right kidney within normal limits. Adrenal glands are within normal limits. Small posterior right bladder diverticulum is unchanged. No bladder calculi. Stomach/Bowel: There is a small hiatal hernia. Stomach is within normal limits. Appendix appears normal. No evidence of bowel wall thickening, distention, or inflammatory changes. There is sigmoid colon diverticulosis. There also scattered  small bowel diverticula in the lower abdomen. Vascular/Lymphatic: Aortic atherosclerosis. No enlarged abdominal or pelvic lymph nodes. Reproductive: Uterus and bilateral adnexa are unremarkable. Other: No abdominal wall hernia or abnormality. No abdominopelvic ascites. Musculoskeletal: Severe degenerative changes affect the spine. IMPRESSION: 1. No acute localizing process in the abdomen or pelvis. 2. Small bilateral pleural  effusions with bibasilar atelectasis. 3. Colonic and small bowel diverticulosis without evidence for diverticulitis. 4. Stable left renal atrophy. Aortic Atherosclerosis (ICD10-I70.0). Electronically Signed   By: Darliss Cheney M.D.   On: 03/29/2023 00:24   DG Chest Portable 1 View  Result Date: 03/28/2023 CLINICAL DATA:  Fever, altered mental status. EXAM: PORTABLE CHEST 1 VIEW COMPARISON:  12/19/2022. FINDINGS: The heart is enlarged and the mediastinal contour is within normal limits. There is atherosclerotic calcification of the aorta. Lung volumes are low. There is a small left pleural effusion with airspace in the mid to lower left lung field. No pneumothorax. No acute osseous abnormality. IMPRESSION: 1. Airspace disease in the mid to lower left lung, possible edema or infiltrate. 2. Small left pleural effusion. Electronically Signed   By: Thornell Sartorius M.D.   On: 03/28/2023 22:30    Pending Labs Unresulted Labs (From admission, onward)     Start     Ordered   03/29/23 0500  CBC  Tomorrow morning,   R        03/29/23 0059   03/29/23 0500  Basic metabolic panel  Tomorrow morning,   R        03/29/23 0059   03/29/23 0500  Magnesium  Tomorrow morning,   R        03/29/23 0059   03/29/23 0500  Phosphorus  Tomorrow morning,   R        03/29/23 0059   03/29/23 0359  Lactic acid, plasma  STAT Now then every 3 hours,   R      03/29/23 0221   03/29/23 0100  Urine Culture  (Urine Culture)  ONCE - URGENT,   URGENT       Question:  Indication  Answer:  Sepsis   03/29/23 0059   03/28/23 2109  Gastrointestinal Panel by PCR , Stool  (Gastrointestinal Panel by PCR, Stool                                                                                                                                                     **Does Not include CLOSTRIDIUM DIFFICILE testing. **If CDIFF testing is needed, place order from the "C Difficile Testing" order set.**)  Once,   URGENT        03/28/23 2109   03/28/23 2109   C Difficile Quick Screen w PCR reflex  (C Difficile quick screen w PCR reflex panel )  Once, for 24 hours,   URGENT  References:    CDiff Information Tool   03/28/23 2109            Vitals/Pain Today's Vitals   03/29/23 0306 03/29/23 0310 03/29/23 0330 03/29/23 0400  BP: (!) 111/52  (!) 112/51 (!) 105/54  Pulse: 94  90 90  Resp: 19  17 (!) 22  Temp:  100.3 F (37.9 C)    TempSrc:  Rectal    SpO2: 95%  98% 96%    Isolation Precautions Enteric precautions (UV disinfection)  Medications Medications  ceFEPIme (MAXIPIME) 2 g in sodium chloride 0.9 % 100 mL IVPB (has no administration in time range)  vancomycin (VANCOREADY) IVPB 750 mg/150 mL (has no administration in time range)  apixaban (ELIQUIS) tablet 2.5 mg (has no administration in time range)  DULoxetine (CYMBALTA) DR capsule 60 mg (has no administration in time range)  levothyroxine (SYNTHROID) tablet 112 mcg (has no administration in time range)  pantoprazole (PROTONIX) EC tablet 40 mg (has no administration in time range)  acetaminophen (TYLENOL) tablet 650 mg (has no administration in time range)  prochlorperazine (COMPAZINE) injection 5 mg (has no administration in time range)  melatonin tablet 5 mg (has no administration in time range)  ipratropium-albuterol (DUONEB) 0.5-2.5 (3) MG/3ML nebulizer solution 3 mL (has no administration in time range)  midodrine (PROAMATINE) tablet 5 mg (has no administration in time range)  lactated ringers infusion (has no administration in time range)  lactated ringers bolus 1,000 mL (0 mLs Intravenous Stopped 03/28/23 2257)  ceFEPIme (MAXIPIME) 2 g in sodium chloride 0.9 % 100 mL IVPB (0 g Intravenous Stopped 03/28/23 2232)  vancomycin (VANCOREADY) IVPB 1250 mg/250 mL (0 mg Intravenous Stopped 03/29/23 0114)  ondansetron (ZOFRAN) injection 4 mg (4 mg Intravenous Given 03/28/23 2212)  lactated ringers bolus 1,000 mL (0 mLs Intravenous Stopped 03/29/23 0049)  iohexol (OMNIPAQUE) 300 MG/ML  solution 80 mL (80 mLs Intravenous Contrast Given 03/29/23 0014)  lactated ringers bolus 1,000 mL (0 mLs Intravenous Stopped 03/29/23 0145)  acetaminophen (TYLENOL) suppository 650 mg (650 mg Rectal Given 03/29/23 0111)    Mobility non-ambulatory     Focused Assessments Pt has recurrent UTI/sepsis. Was on abx recently for UTI and began having diarrhea today. Pt is in enteric precautions for rule out cdiff. Pt has not had bowel movement in ED. Lactic acid results are 4.3, 6.1, and 5.2 respectively. Cultures sent and abx initiated. Fluid boluses complete. Pt has been mildly tachycardic and febrile with rectal tmax at 102.4. received tylenol suppository and is down to 100.3.    R Recommendations: See Admitting Provider Note  Report given to:   Additional Notes: daughter at bedside

## 2023-03-29 NOTE — Progress Notes (Signed)
OT Cancellation Note  Patient Details Name: Erin Ryan MRN: [id number] DOB: 22-Jan-1925   Cancelled Treatment:    Reason Eval/Treat Not Completed: Patient not medically ready Nurse reporting patient has increased agitation this afternoon. OT to continue to follow and check back on 7/8.  Rosalio Loud, MS Acute Rehabilitation Department Office# [phone]  03/29/2023, 12:52 PM

## 2023-03-29 NOTE — Progress Notes (Signed)
PROGRESS NOTE    Erin Ryan  UJW:[id number] DOB: 1924-11-16 DOA: 03/28/2023 PCP: [name], MD    Brief Narrative:   Erin Ryan is a 89 y.o. female with past medical history significant for paroxysmal atrial fibrillation on Eliquis, HTN, HLD, hypothyroidism, CKD stage IIIb, anxiety/depression, dementia who presented to [facility] ED on 7/6 via EMS from home with confusion.  Confusion is different than her typical baseline from underlying dementia.  Daughter also reports abdominal pain, diarrhea.  Recently treated for urinary tract infection.  Daughter concerned about possible viral illness.  In the ED, temperature 102.4 F, HR 108, RR 28, BP 144/103, SpO2 96% on room air.  WBC 10.2, hemoglobin 12.2, platelets 301.  Sodium 140, potassium 3.8, chloride 103, CO2 22, glucose 153, BUN 39, creatinine 1.46.  AST 34, ALT 25, total bilirubin 0.5.  Lactic acid 4.3.  Urinalysis with moderate leukocytes, negative nitrite, many bacteria, greater than 50 WBCs.  C. difficile PCR negative.  Chest x-ray with airspace disease mid to lower left lung consistent with edema versus infiltrate, small left pleural effusion.  CT abdomen/pelvis with contrast with no acute localizing process in the abdomen/pelvis, small bilateral pleural effusions with bibasilar atelectasis, colonic/small bowel diverticulosis without evidence of diverticulitis, stable left renal atrophy, aortic atherosclerosis.  Patient was given IV fluid bolus, started on empiric antibiotics by EDP.  TRH consulted for admission for further evaluation management of severe sepsis secondary to pneumonia versus UTI.  Assessment & Plan:   Severe sepsis, POA Urinary tract infection Community-acquired pneumonia Lactic acidosis Patient presenting to ED with confusion worse than her typical baseline.  Patient was noted to be febrile with temperature 102.4 F, tachycardic, tachypneic with end organ damage consistent with altered mental status/acute  metabolic encephalopathy from underlying infection.  WBC count 10.2.  Procalcitonin 0.16.  Urinalysis with moderate leukocytes, negative nitrite, many bacteria, greater than 50 WBCs.  Chest x-ray with concerning of infiltrates on left. -- WBC 10.2>10.2 -- Lactic acid 4.3>6.1>5.2 -- Blood cultures x 2: Pending -- Urine culture: Pending -- Vancomycin, pharmacy consulted for dosing/monitoring -- Cefepime 2 g IV every 24 hours -- LR at 50 mL/h -- Continue supplemental oxygen, maintain SpO2 greater than 92% -- Follow CBC, lactic acid, procalcitonin daily  Diarrhea Daughter reports patient with episode of diarrhea prior to admission.  No recent antibiotic exposure or sick contacts.  Concern for viral illness.  C. difficile PCR negative. -- GI PCR panel: Pending -- LR at 50 mL/h -- Continue enteric precautions for now  Essential hypertension Home medications include amlodipine 5 mg p.o. daily, furosemide 40 mg p.o. daily, metoprolol tartrate 75 mg p.o. twice daily. -- BP 118/58 this morning -- Continue to hold home and hypertensives for now -- Continue monitor BP closely  Paroxysmal atrial fibrillation -- Continue Eliquis 2.5 mg p.o. twice daily  Hyperlipidemia -- atorvastatin 40 mg p.o. daily for now  Hypothyroidism -- Levothyroxine 112 mcg p.o. daily  CKD stage IIIb -- Cr 1.46>1.20, stable -- BMP daily  Dementia --Delirium precautions --Get up during the day --Encourage a familiar face to remain present throughout the day --Keep blinds open and lights on during daylight hours --Minimize the use of opioids/benzodiazepines --Donepezil 10 mg p.o. every morning --Melatonin 5 mg p.o. nightly as needed    DVT prophylaxis: apixaban (ELIQUIS) tablet 2.5 mg Start: 03/29/23 0600 apixaban (ELIQUIS) tablet 2.5 mg    Code Status: DNR Family Communication: Updated daughter present at bedside this morning  Disposition Plan:  Level of  care: Stepdown Status is: Inpatient Remains  inpatient appropriate because: IV antibiotics    Consultants:  None  Procedures:  None  Antimicrobials:  Vancomycin 7/6>> Cefepime 7/6>>   Subjective: Patient seen examined at bedside, resting calmly.  Lying in bed.  Daughter present.  Sleeping but arousable.  Recognizes daughter, no other specific complaints at this time although underlying dementia and poor historian.  Denies shortness of breath, no chest pain.  No acute concerns overnight per nursing staff.  Objective: Vitals:   03/29/23 0400 03/29/23 0435 03/29/23 0500 03/29/23 0630  BP: (!) 105/54 128/87 (!) 118/58 (!) 129/56  Pulse: 90 97 91   Resp: (!) 22 19 19    Temp:    98.8 F (37.1 C)  TempSrc:    Oral  SpO2: 96% 100% 96%     Intake/Output Summary (Last 24 hours) at 03/29/2023 1024 Last data filed at 03/29/2023 0145 Gross per 24 hour  Intake 3350 ml  Output --  Net 3350 ml   There were no vitals filed for this visit.  Examination:  Physical Exam: GEN: NAD, somnolent but arousable, chronically ill/elderly in appearance HEENT: NCAT, PERRL, EOMI, sclera clear, dry mucous membranes PULM: Breath sounds diminished bilateral bases, no wheezes/crackles, normal respiratory effort without accessory muscle use, on 2 L nasal cannula CV: RRR w/o M/G/R GI: abd soft, NTND, NABS, no R/G/M MSK: no peripheral edema, moves all extremities independently NEURO: No focal neurological deficit Integumentary: No concerning rashes/lesions/wounds noted on exposed skin surfaces.    Data Reviewed: I have personally reviewed following labs and imaging studies  CBC: Recent Labs  Lab 03/28/23 2118 03/29/23 0500  WBC 10.2 10.2  NEUTROABS 9.3*  --   HGB 12.2 10.1*  HCT 38.7 31.9*  MCV 97.5 100.9*  PLT 301 245   Basic Metabolic Panel: Recent Labs  Lab 03/28/23 2118 03/29/23 0500  NA 140 138  K 3.8 3.5  CL 103 105  CO2 22 23  GLUCOSE 153* 110*  BUN 39* 34*  CREATININE 1.46* 1.20*  CALCIUM 8.2* 7.5*  MG  --  1.5*   PHOS  --  3.5   GFR: CrCl cannot be calculated (Unknown ideal weight.). Liver Function Tests: Recent Labs  Lab 03/28/23 2118  AST 34  ALT 25  ALKPHOS 69  BILITOT 0.5  PROT 7.1  ALBUMIN 3.0*   No results for input(s): "LIPASE", "AMYLASE" in the last 168 hours. No results for input(s): "AMMONIA" in the last 168 hours. Coagulation Profile: No results for input(s): "INR", "PROTIME" in the last 168 hours. Cardiac Enzymes: No results for input(s): "CKTOTAL", "CKMB", "CKMBINDEX", "TROPONINI" in the last 168 hours. BNP (last 3 results) No results for input(s): "PROBNP" in the last 8760 hours. HbA1C: No results for input(s): "HGBA1C" in the last 72 hours. CBG: No results for input(s): "GLUCAP" in the last 168 hours. Lipid Profile: No results for input(s): "CHOL", "HDL", "LDLCALC", "TRIG", "CHOLHDL", "LDLDIRECT" in the last 72 hours. Thyroid Function Tests: No results for input(s): "TSH", "T4TOTAL", "FREET4", "T3FREE", "THYROIDAB" in the last 72 hours. Anemia Panel: No results for input(s): "VITAMINB12", "FOLATE", "FERRITIN", "TIBC", "IRON", "RETICCTPCT" in the last 72 hours. Sepsis Labs: Recent Labs  Lab 03/28/23 2116 03/28/23 2118 03/28/23 2325 03/29/23 0210 03/29/23 0642  PROCALCITON 0.16  --   --   --   --   LATICACIDVEN  --  4.3* 6.1* 5.2* 1.7    Recent Results (from the past 240 hour(s))  Blood culture (routine x 2)     Status:  None (Preliminary result)   Collection Time: 03/28/23  9:18 PM   Specimen: BLOOD RIGHT FOREARM  Result Value Ref Range Status   Specimen Description   Final    BLOOD RIGHT FOREARM Performed at [facility], [address] [place], [place] [address]    Special Requests   Final    Blood Culture adequate volume BOTTLES DRAWN AEROBIC AND ANAEROBIC Performed at [facility], [address], [place], [place] [address]    Culture PENDING  Incomplete   Report Status PENDING  Incomplete  Culture, blood (Routine X 2) w  Reflex to ID Panel     Status: None (Preliminary result)   Collection Time: 03/28/23 10:01 PM   Specimen: BLOOD RIGHT FOREARM  Result Value Ref Range Status   Specimen Description   Final    BLOOD RIGHT FOREARM Performed at [facility], [address], [place], [place] [address]    Special Requests   Final    BOTTLES DRAWN AEROBIC AND ANAEROBIC Blood Culture adequate volume Performed at [facility], [address], [place], [place] [address]    Culture PENDING  Incomplete   Report Status PENDING  Incomplete  MRSA Next Gen by PCR, Nasal     Status: None   Collection Time: 03/29/23  3:05 AM   Specimen: Nasal Mucosa; Nasal Swab  Result Value Ref Range Status   MRSA by PCR Next Gen NOT DETECTED NOT DETECTED Final    Comment: (NOTE) The GeneXpert MRSA Assay (FDA approved for NASAL specimens only), is one component of a comprehensive MRSA colonization surveillance program. It is not intended to diagnose MRSA infection nor to guide or monitor treatment for MRSA infections. Test performance is not FDA approved in patients less than 69 years old. Performed at [facility], [address] [place], [place] [address]   C Difficile Quick Screen w PCR reflex     Status: None   Collection Time: 03/29/23  4:54 AM   Specimen: Stool  Result Value Ref Range Status   C Diff antigen NEGATIVE NEGATIVE Final   C Diff toxin NEGATIVE NEGATIVE Final   C Diff interpretation No C. difficile detected.  Final    Comment: Performed at [facility], [address], [place], [place] [address]         Radiology Studies: CT ABDOMEN PELVIS W CONTRAST  Result Date: 03/29/2023 CLINICAL DATA:  Abdominal pain EXAM: CT ABDOMEN AND PELVIS WITH CONTRAST TECHNIQUE: Multidetector CT imaging of the abdomen and pelvis was performed using the standard protocol following bolus administration of intravenous contrast. RADIATION DOSE REDUCTION: This exam was  performed according to the departmental dose-optimization program which includes automated exposure control, adjustment of the mA and/or kV according to patient size and/or use of iterative reconstruction technique. CONTRAST:  80mL OMNIPAQUE IOHEXOL 300 MG/ML  SOLN COMPARISON:  CT chest abdomen and pelvis 03/01/2022 FINDINGS: Lower chest: There are small bilateral pleural effusions with bibasilar atelectasis. Hepatobiliary: There is an unchanged cyst in the left lobe of the liver measuring 2 cm. Gallbladder and bile ducts are within normal limits. Pancreas: Unremarkable. No pancreatic ductal dilatation or surrounding inflammatory changes. Spleen: Normal in size without focal abnormality. Adrenals/Urinary Tract: Left renal atrophy is unchanged. Right kidney within normal limits. Adrenal glands are within normal limits. Small posterior right bladder diverticulum is unchanged. No bladder calculi. Stomach/Bowel: There is a small hiatal hernia. Stomach is within normal limits. Appendix appears normal. No  evidence of bowel wall thickening, distention, or inflammatory changes. There is sigmoid colon diverticulosis. There also scattered small bowel diverticula in the lower abdomen. Vascular/Lymphatic: Aortic atherosclerosis. No enlarged abdominal or pelvic lymph nodes. Reproductive: Uterus and bilateral adnexa are unremarkable. Other: No abdominal wall hernia or abnormality. No abdominopelvic ascites. Musculoskeletal: Severe degenerative changes affect the spine. IMPRESSION: 1. No acute localizing process in the abdomen or pelvis. 2. Small bilateral pleural effusions with bibasilar atelectasis. 3. Colonic and small bowel diverticulosis without evidence for diverticulitis. 4. Stable left renal atrophy. Aortic Atherosclerosis (ICD10-I70.0). Electronically Signed   By: Darliss Cheney M.D.   On: 03/29/2023 00:24   DG Chest Portable 1 View  Result Date: 03/28/2023 CLINICAL DATA:  Fever, altered mental status. EXAM: PORTABLE  CHEST 1 VIEW COMPARISON:  12/19/2022. FINDINGS: The heart is enlarged and the mediastinal contour is within normal limits. There is atherosclerotic calcification of the aorta. Lung volumes are low. There is a small left pleural effusion with airspace in the mid to lower left lung field. No pneumothorax. No acute osseous abnormality. IMPRESSION: 1. Airspace disease in the mid to lower left lung, possible edema or infiltrate. 2. Small left pleural effusion. Electronically Signed   By: Thornell Sartorius M.D.   On: 03/28/2023 22:30        Scheduled Meds:  apixaban  2.5 mg Oral BID   Chlorhexidine Gluconate Cloth  6 each Topical Q0600   DULoxetine  60 mg Oral Daily   levothyroxine  112 mcg Oral Q0600   midodrine  5 mg Oral STAT   pantoprazole  40 mg Oral Daily   Continuous Infusions:  ceFEPime (MAXIPIME) IV     lactated ringers     [START ON 03/31/2023] vancomycin       LOS: 0 days    Time spent: 52 minutes spent on chart review, discussion with nursing staff, consultants, updating family and interview/physical exam; more than 50% of that time was spent in counseling and/or coordination of care.    Alvira Philips [place], DO [facility] Available via Epic secure chat 7am-7pm After these hours, please refer to coverage provider listed on [email or web address] 03/29/2023, 10:24 AM

## 2023-03-30 DIAGNOSIS — A419 Sepsis, unspecified organism: Secondary | ICD-10-CM | POA: Diagnosis not present

## 2023-03-30 DIAGNOSIS — R6520 Severe sepsis without septic shock: Secondary | ICD-10-CM | POA: Diagnosis not present

## 2023-03-30 LAB — GASTROINTESTINAL PANEL BY PCR, STOOL (REPLACES STOOL CULTURE)

## 2023-03-30 LAB — CBC
HCT: 28.8 % — ABNORMAL LOW (ref 36.0–46.0)
Hemoglobin: 9.1 g/dL — ABNORMAL LOW (ref 12.0–15.0)
MCH: 31.3 pg (ref 26.0–34.0)
MCHC: 31.6 g/dL (ref 30.0–36.0)
MCV: 99 fL (ref 80.0–100.0)
Platelets: 205 10*3/uL (ref 150–400)
RBC: 2.91 MIL/uL — ABNORMAL LOW (ref 3.87–5.11)
RDW: 14.9 % (ref 11.5–15.5)
WBC: 7 10*3/uL (ref 4.0–10.5)
nRBC: 0 % (ref 0.0–0.2)

## 2023-03-30 LAB — BASIC METABOLIC PANEL
Anion gap: 12 (ref 5–15)
BUN: 26 mg/dL — ABNORMAL HIGH (ref 8–23)
CO2: 18 mmol/L — ABNORMAL LOW (ref 22–32)
Calcium: 7 mg/dL — ABNORMAL LOW (ref 8.9–10.3)
Chloride: 105 mmol/L (ref 98–111)
Creatinine, Ser: 0.89 mg/dL (ref 0.44–1.00)
GFR, Estimated: 59 mL/min — ABNORMAL LOW (ref >60)
Glucose, Bld: 94 mg/dL (ref 70–99)
Potassium: 3 mmol/L — ABNORMAL LOW (ref 3.5–5.1)
Sodium: 135 mmol/L (ref 135–145)

## 2023-03-30 LAB — MAGNESIUM: Magnesium: 2.3 mg/dL (ref 1.7–2.4)

## 2023-03-30 LAB — LACTIC ACID, PLASMA: Lactic Acid, Venous: 5.3 mmol/L (ref 0.5–1.9)

## 2023-03-30 LAB — PROCALCITONIN: Procalcitonin: 0.17 ng/mL

## 2023-03-30 MED ORDER — LACTATED RINGERS IV BOLUS
250.0000 mL | Freq: Once | INTRAVENOUS | Status: AC
  Administered 2023-03-30: 250 mL via INTRAVENOUS

## 2023-03-30 MED ORDER — METOPROLOL TARTRATE 5 MG/5ML IV SOLN
1.0000 mg | Freq: Once | INTRAVENOUS | Status: AC | PRN
  Administered 2023-03-30: 1 mg via INTRAVENOUS
  Filled 2023-03-30: qty 5

## 2023-03-30 MED ORDER — POTASSIUM CHLORIDE 10 MEQ/100ML IV SOLN
10.0000 meq | INTRAVENOUS | Status: AC
  Administered 2023-03-30 (×5): 10 meq via INTRAVENOUS
  Filled 2023-03-30 (×5): qty 100

## 2023-03-30 MED ORDER — METOPROLOL TARTRATE 5 MG/5ML IV SOLN
2.5000 mg | Freq: Once | INTRAVENOUS | Status: AC | PRN
  Administered 2023-03-30: 2.5 mg via INTRAVENOUS
  Filled 2023-03-30: qty 5

## 2023-03-30 MED ORDER — SODIUM CHLORIDE 0.9 % IV SOLN
3.0000 g | Freq: Three times a day (TID) | INTRAVENOUS | Status: DC
  Administered 2023-03-30 – 2023-04-01 (×7): 3 g via INTRAVENOUS
  Filled 2023-03-30 (×7): qty 8

## 2023-03-30 MED ORDER — METOPROLOL TARTRATE 25 MG PO TABS
75.0000 mg | ORAL_TABLET | Freq: Two times a day (BID) | ORAL | Status: DC
  Administered 2023-03-30 – 2023-04-01 (×4): 75 mg via ORAL
  Filled 2023-03-30 (×5): qty 3

## 2023-03-30 MED ORDER — METOPROLOL TARTRATE 5 MG/5ML IV SOLN
5.0000 mg | Freq: Once | INTRAVENOUS | Status: DC
  Filled 2023-03-30: qty 5

## 2023-03-30 NOTE — Progress Notes (Signed)
PHARMACY NOTE:  ANTIMICROBIAL RENAL DOSAGE ADJUSTMENT  Current antimicrobial regimen includes a mismatch between antimicrobial dosage and estimated renal function.  As per policy approved by the Pharmacy & Therapeutics and Medical Executive Committees, the antimicrobial dosage will be adjusted accordingly.  Current antimicrobial dosage:  Unasyn 3gm IV q12h  Indication: sepsis likely from a urinary for respiratory source  Renal Function:   Estimated Creatinine Clearance: 31.4 mL/min (by C-G formula based on SCr of 0.89 mg/dL). []      On intermittent HD, scheduled: []      On CRRT    Antimicrobial dosage has been changed to:   Unasyn 3gm IV q8h  Additional comments:   Thank you for allowing pharmacy to be a part of this patient's care.  Junita Push, [facility] 03/30/2023 4:58 AM

## 2023-03-30 NOTE — Progress Notes (Signed)
Pt had a change in heart rhythm (sustained V. Tach) around 6045-4098.  DNP, Chinita Greenland notified. Rx 1mg  of metoprolol, prior to administering IV metoprolol, pt was hypotensive. DNP, Chinita Greenland notified, ordered to administer LR bolus and still remained in V.tach, pt's BP shortly recovered after administering fluid bolus. This nurse then proceeded to administer 1mg  metoprolol. Pt remained in V. Tach after administering 1mg  IV metoprolol, DNP, Chinita Greenland aware and ordered an additional 2.5mg  IV metoprolol before seeing a decrease in HR.

## 2023-03-30 NOTE — Progress Notes (Signed)
OT Cancellation Note  Patient Details Name: Trinie Speno MRN: [id number] DOB: Aug 06, 1925   Cancelled Treatment:    Reason Eval/Treat Not Completed: Patient declined, no reason specified Patient had requested to lie back down per nursing. OT presented to room with patient reporting she does not want to move with two visitors in room at this time. OT to continue to follow.   Rosalio Loud, MS Acute Rehabilitation Department Office# [phone]  03/30/2023, 1:06 PM

## 2023-03-30 NOTE — Progress Notes (Signed)
  Daily Progress Note   [name]       Date: 03/30/2023 DOB: 1925/06/18  Age: 89 y.o. MRN#: [id number] Attending Physician: [place], Eric J, DO Primary Care Physician: [name], MD Admit Date: 03/28/2023 Length of Stay: 1 day  Discussed care with primary hospitalist today. Hospitalist already engaging in GOC conversations with patient's daughter at this time. Patient already DNR. Awaiting outcomes to determine medical planning. Hospitalist will place consult for PMT if needed in the future. Thank you.    Alvester Morin, DO Palliative Care Provider PMT # [phone]

## 2023-03-30 NOTE — Progress Notes (Addendum)
PROGRESS NOTE    Erin Ryan  ZOX:[id number] DOB: [birth year] DOA: 03/28/2023 PCP: [name], MD    Brief Narrative:   Erin Ryan is a 89 y.o. female with past medical history significant for paroxysmal atrial fibrillation on Eliquis, HTN, HLD, hypothyroidism, CKD stage IIIb, anxiety/depression, dementia who presented to [facility] ED on 7/6 via EMS from home with confusion.  Confusion is different than her typical baseline from underlying dementia.  Daughter also reports abdominal pain, diarrhea.  Recently treated for urinary tract infection.  Daughter concerned about possible viral illness.  In the ED, temperature 102.4 F, HR 108, RR 28, BP 144/103, SpO2 96% on room air.  WBC 10.2, hemoglobin 12.2, platelets 301.  Sodium 140, potassium 3.8, chloride 103, CO2 22, glucose 153, BUN 39, creatinine 1.46.  AST 34, ALT 25, total bilirubin 0.5.  Lactic acid 4.3.  Urinalysis with moderate leukocytes, negative nitrite, many bacteria, greater than 50 WBCs.  C. difficile PCR negative.  Chest x-ray with airspace disease mid to lower left lung consistent with edema versus infiltrate, small left pleural effusion.  CT abdomen/pelvis with contrast with no acute localizing process in the abdomen/pelvis, small bilateral pleural effusions with bibasilar atelectasis, colonic/small bowel diverticulosis without evidence of diverticulitis, stable left renal atrophy, aortic atherosclerosis.  Patient was given IV fluid bolus, started on empiric antibiotics by EDP.  TRH consulted for admission for further evaluation management of severe sepsis secondary to pneumonia versus UTI.  Assessment & Plan:   Severe sepsis, POA Urinary tract infection Community-acquired pneumonia Lactic acidosis Patient presenting to ED with confusion worse than her typical baseline.  Patient was noted to be febrile with temperature 102.4 F, tachycardic, tachypneic with end organ damage consistent with altered mental status/acute  metabolic encephalopathy from underlying infection.  WBC count 10.2.  Procalcitonin 0.16.  Urinalysis with moderate leukocytes, negative nitrite, many bacteria, greater than 50 WBCs.  Chest x-ray with concerning of infiltrates on left. -- WBC 10.2>10.2>7.0 -- Lactic acid 4.3>6.1>5.2>1.7>5.3 -- Blood cultures x 2: No growth x 1 day -- Urine culture: Pending -- Vancomycin, pharmacy consulted for dosing/monitoring -- Cefepime 2 g IV every 24 hours -- LR at 50 mL/h -- Continue supplemental oxygen, maintain SpO2 greater than 92% -- Follow CBC, lactic acid, procalcitonin daily  Diarrhea 2/2 norovirus Daughter reports patient with episode of diarrhea prior to admission.  No recent antibiotic exposure; but daughter reports similar symptoms.  C. difficile PCR negative.  GI PCR panel positive for norovirus. -- LR at 50 mL/h -- Continue enteric precautions for now  Hypokalemia Potassium 3.0, magnesium 2.3.  Will replete potassium -- Repeat electrolytes in the a.m.  Essential hypertension Home medications include amlodipine 5 mg p.o. daily, furosemide 40 mg p.o. daily, metoprolol tartrate 75 mg p.o. twice daily. -- BP 118/58 this morning -- Restart metoprolol tartrate 75 mg p.o. twice daily today -- Continue to hold home furosemide, amlodipine for now -- Continue monitor BP closely  Paroxysmal atrial fibrillation --Metoprolol tartrate 75 mg p.o. twice daily -- Continue Eliquis 2.5 mg p.o. twice daily  Hyperlipidemia -- atorvastatin 40 mg p.o. daily for now  Hypothyroidism -- Levothyroxine 112 mcg p.o. daily  CKD stage IIIb -- Cr 1.46>1.20>0.89, stable -- BMP daily  Dementia --Delirium precautions --Get up during the day --Encourage a familiar face to remain present throughout the day --Keep blinds open and lights on during daylight hours --Minimize the use of opioids/benzodiazepines --Donepezil 10 mg p.o. every morning --Melatonin 5 mg p.o. nightly as needed  DVT  prophylaxis: apixaban (ELIQUIS) tablet 2.5 mg Start: 03/29/23 0600 apixaban (ELIQUIS) tablet 2.5 mg    Code Status: DNR Family Communication: Updated daughter present at bedside this morning  Disposition Plan:  Level of care: Stepdown Status is: Inpatient Remains inpatient appropriate because: IV antibiotics    Consultants:  None  Procedures:  None  Antimicrobials:  Vancomycin 7/6>> Cefepime 7/6>>   Subjective: Patient seen examined at bedside, lying in bed.  Somnolent.  Required 2 doses of IV Ativan overnight due to agitation.  Daughter present at bedside.  Poorly responsive likely secondary to sedation from benzos.  Discussed with daughter extensively regarding her current response to treatments which has been poor so far.  Discussed will continue aggressive measures for another 1-2 days and if no significant improvement may need to consider transitioning to a more palliative/comfort approach with hospice.  Patient's daughter was tearful but understanding given her age, comorbidities and overall poor quality of life.  Overnight patient with episode of A-fib with RVR versus sustained VT treated with IV metoprolol.  Telemetry now showing normal sinus rhythm with controlled rate.  Restarting her oral metoprolol given blood pressure is much improved.  Also will discontinue Ativan.  Unable to obtain any further ROS from patient given her encephalopathy/somnolence from benzos.  No acute concerns overnight per nursing staff.  Objective: Vitals:   03/30/23 0400 03/30/23 0500 03/30/23 0600 03/30/23 0800  BP: (!) 148/122 125/67 (!) 136/56   Pulse: (!) 141 93 99   Resp: (!) 21 (!) 21 (!) 21   Temp: 98.2 F (36.8 C)   97.6 F (36.4 C)  TempSrc: Axillary   Oral  SpO2: 95% 96% 98%   Weight:  64.4 kg    Height:        Intake/Output Summary (Last 24 hours) at 03/30/2023 1130 Last data filed at 03/30/2023 0600 Gross per 24 hour  Intake 2393.99 ml  Output 500 ml  Net 1893.99 ml   Filed  Weights   03/29/23 0800 03/30/23 0500  Weight: 62.7 kg 64.4 kg    Examination:  Physical Exam: GEN: NAD, somnolent, chronically ill/elderly in appearance HEENT: NCAT, PERRL, EOMI, sclera clear, dry mucous membranes PULM: Breath sounds diminished bilateral bases, no wheezes/crackles, normal respiratory effort without accessory muscle use, on 4 L nasal cannula CV: RRR w/o M/G/R GI: abd soft, NTND, NABS, no R/G/M MSK: no peripheral edema NEURO: Somnolent Integumentary: No concerning rashes/lesions/wounds noted on exposed skin surfaces.    Data Reviewed: I have personally reviewed following labs and imaging studies  CBC: Recent Labs  Lab 03/28/23 2118 03/29/23 0500 03/30/23 0244  WBC 10.2 10.2 7.0  NEUTROABS 9.3*  --   --   HGB 12.2 10.1* 9.1*  HCT 38.7 31.9* 28.8*  MCV 97.5 100.9* 99.0  PLT 301 245 205   Basic Metabolic Panel: Recent Labs  Lab 03/28/23 2118 03/29/23 0500 03/30/23 0244  NA 140 138 135  K 3.8 3.5 3.0*  CL 103 105 105  CO2 22 23 18*  GLUCOSE 153* 110* 94  BUN 39* 34* 26*  CREATININE 1.46* 1.20* 0.89  CALCIUM 8.2* 7.5* 7.0*  MG  --  1.5* 2.3  PHOS  --  3.5  --    GFR: Estimated Creatinine Clearance: 31.8 mL/min (by C-G formula based on SCr of 0.89 mg/dL). Liver Function Tests: Recent Labs  Lab 03/28/23 2118  AST 34  ALT 25  ALKPHOS 69  BILITOT 0.5  PROT 7.1  ALBUMIN 3.0*   No results  for input(s): "LIPASE", "AMYLASE" in the last 168 hours. No results for input(s): "AMMONIA" in the last 168 hours. Coagulation Profile: No results for input(s): "INR", "PROTIME" in the last 168 hours. Cardiac Enzymes: No results for input(s): "CKTOTAL", "CKMB", "CKMBINDEX", "TROPONINI" in the last 168 hours. BNP (last 3 results) No results for input(s): "PROBNP" in the last 8760 hours. HbA1C: No results for input(s): "HGBA1C" in the last 72 hours. CBG: Recent Labs  Lab 03/29/23 1722  GLUCAP 100*   Lipid Profile: No results for input(s): "CHOL",  "HDL", "LDLCALC", "TRIG", "CHOLHDL", "LDLDIRECT" in the last 72 hours. Thyroid Function Tests: No results for input(s): "TSH", "T4TOTAL", "FREET4", "T3FREE", "THYROIDAB" in the last 72 hours. Anemia Panel: No results for input(s): "VITAMINB12", "FOLATE", "FERRITIN", "TIBC", "IRON", "RETICCTPCT" in the last 72 hours. Sepsis Labs: Recent Labs  Lab 03/28/23 2116 03/28/23 2118 03/28/23 2325 03/29/23 0210 03/29/23 0642 03/30/23 0244 03/30/23 0528  PROCALCITON 0.16  --   --   --   --   --  0.17  LATICACIDVEN  --    < > 6.1* 5.2* 1.7 5.3*  --    < > = values in this interval not displayed.    Recent Results (from the past 240 hour(s))  Blood culture (routine x 2)     Status: None (Preliminary result)   Collection Time: 03/28/23  9:18 PM   Specimen: BLOOD RIGHT FOREARM  Result Value Ref Range Status   Specimen Description   Final    BLOOD RIGHT FOREARM Performed at [facility], [address], [place], [place] [address]    Special Requests   Final    Blood Culture adequate volume BOTTLES DRAWN AEROBIC AND ANAEROBIC Performed at [facility], [address], [place], [place] [address]    Culture   Final    NO GROWTH 1 DAY Performed at [facility], [address], [place], [place] [address]    Report Status PENDING  Incomplete  Culture, blood (Routine X 2) w Reflex to ID Panel     Status: None (Preliminary result)   Collection Time: 03/28/23 10:01 PM   Specimen: BLOOD RIGHT FOREARM  Result Value Ref Range Status   Specimen Description   Final    BLOOD RIGHT FOREARM Performed at [facility], [address] [place], [place] [address]    Special Requests   Final    BOTTLES DRAWN AEROBIC AND ANAEROBIC Blood Culture adequate volume Performed at [facility], [address], [place], [place] [address]    Culture   Final    NO GROWTH 1 DAY Performed at [facility], [address], [place], [place] [address]     Report Status PENDING  Incomplete  MRSA Next Gen by PCR, Nasal     Status: None   Collection Time: 03/29/23  3:05 AM   Specimen: Nasal Mucosa; Nasal Swab  Result Value Ref Range Status   MRSA by PCR Next Gen NOT DETECTED NOT DETECTED Final    Comment: (NOTE) The GeneXpert MRSA Assay (FDA approved for NASAL specimens only), is one component of a comprehensive MRSA colonization surveillance program. It is not intended to diagnose MRSA infection nor to guide or monitor treatment for MRSA infections. Test performance is not FDA approved in patients less than 88 years old. Performed at [facility], [address], [place], [place] [address]   Gastrointestinal Panel by PCR , Stool     Status:  Abnormal   Collection Time: 03/29/23  4:54 AM   Specimen: Stool  Result Value Ref Range Status   Campylobacter species NOT DETECTED NOT DETECTED Final   Plesimonas shigelloides NOT DETECTED NOT DETECTED Final   Salmonella species NOT DETECTED NOT DETECTED Final   Yersinia enterocolitica NOT DETECTED NOT DETECTED Final   Vibrio species NOT DETECTED NOT DETECTED Final   Vibrio cholerae NOT DETECTED NOT DETECTED Final   Enteroaggregative E coli (EAEC) NOT DETECTED NOT DETECTED Final   Enteropathogenic E coli (EPEC) NOT DETECTED NOT DETECTED Final   Enterotoxigenic E coli (ETEC) NOT DETECTED NOT DETECTED Final   Shiga like toxin producing E coli (STEC) NOT DETECTED NOT DETECTED Final   Shigella/Enteroinvasive E coli (EIEC) NOT DETECTED NOT DETECTED Final   Cryptosporidium NOT DETECTED NOT DETECTED Final   Cyclospora cayetanensis NOT DETECTED NOT DETECTED Final   Entamoeba histolytica NOT DETECTED NOT DETECTED Final   Giardia lamblia NOT DETECTED NOT DETECTED Final   Adenovirus F40/41 NOT DETECTED NOT DETECTED Final   Astrovirus NOT DETECTED NOT DETECTED Final   Norovirus GI/GII DETECTED (A) NOT DETECTED Final    Comment: CRITICAL RESULT CALLED TO, READ BACK BY AND VERIFIED  WITH: ASHLEY ROBBINS 03/30/23 @ 0735 BY SH    Rotavirus A NOT DETECTED NOT DETECTED Final   Sapovirus (I, II, IV, and V) NOT DETECTED NOT DETECTED Final    Comment: Performed at [facility], [address] [place], [place] [address]  C Difficile Quick Screen w PCR reflex     Status: None   Collection Time: 03/29/23  4:54 AM   Specimen: Stool  Result Value Ref Range Status   C Diff antigen NEGATIVE NEGATIVE Final   C Diff toxin NEGATIVE NEGATIVE Final   C Diff interpretation No C. difficile detected.  Final    Comment: Performed at [facility], [address], [place], [place] [address]         Radiology Studies: CT ABDOMEN PELVIS W CONTRAST  Result Date: 03/29/2023 CLINICAL DATA:  Abdominal pain EXAM: CT ABDOMEN AND PELVIS WITH CONTRAST TECHNIQUE: Multidetector CT imaging of the abdomen and pelvis was performed using the standard protocol following bolus administration of intravenous contrast. RADIATION DOSE REDUCTION: This exam was performed according to the departmental dose-optimization program which includes automated exposure control, adjustment of the mA and/or kV according to patient size and/or use of iterative reconstruction technique. CONTRAST:  80mL OMNIPAQUE IOHEXOL 300 MG/ML  SOLN COMPARISON:  CT chest abdomen and pelvis 03/01/2022 FINDINGS: Lower chest: There are small bilateral pleural effusions with bibasilar atelectasis. Hepatobiliary: There is an unchanged cyst in the left lobe of the liver measuring 2 cm. Gallbladder and bile ducts are within normal limits. Pancreas: Unremarkable. No pancreatic ductal dilatation or surrounding inflammatory changes. Spleen: Normal in size without focal abnormality. Adrenals/Urinary Tract: Left renal atrophy is unchanged. Right kidney within normal limits. Adrenal glands are within normal limits. Small posterior right bladder diverticulum is unchanged. No bladder calculi. Stomach/Bowel: There is a small hiatal  hernia. Stomach is within normal limits. Appendix appears normal. No evidence of bowel wall thickening, distention, or inflammatory changes. There is sigmoid colon diverticulosis. There also scattered small bowel diverticula in the lower abdomen. Vascular/Lymphatic: Aortic atherosclerosis. No enlarged abdominal or pelvic lymph nodes. Reproductive: Uterus and bilateral adnexa are unremarkable. Other: No abdominal wall hernia or abnormality. No abdominopelvic ascites. Musculoskeletal: Severe degenerative changes affect the spine. IMPRESSION: 1. No acute localizing process in the abdomen or pelvis. 2.  Small bilateral pleural effusions with bibasilar atelectasis. 3. Colonic and small bowel diverticulosis without evidence for diverticulitis. 4. Stable left renal atrophy. Aortic Atherosclerosis (ICD10-I70.0). Electronically Signed   By: Darliss Cheney M.D.   On: 03/29/2023 00:24   DG Chest Portable 1 View  Result Date: 03/28/2023 CLINICAL DATA:  Fever, altered mental status. EXAM: PORTABLE CHEST 1 VIEW COMPARISON:  12/19/2022. FINDINGS: The heart is enlarged and the mediastinal contour is within normal limits. There is atherosclerotic calcification of the aorta. Lung volumes are low. There is a small left pleural effusion with airspace in the mid to lower left lung field. No pneumothorax. No acute osseous abnormality. IMPRESSION: 1. Airspace disease in the mid to lower left lung, possible edema or infiltrate. 2. Small left pleural effusion. Electronically Signed   By: Thornell Sartorius M.D.   On: 03/28/2023 22:30        Scheduled Meds:  apixaban  2.5 mg Oral BID   atorvastatin  40 mg Oral Daily   Chlorhexidine Gluconate Cloth  6 each Topical Q0600   donepezil  10 mg Oral Daily   DULoxetine  60 mg Oral Daily   levothyroxine  112 mcg Oral Q0600   melatonin  5 mg Oral QHS   metoprolol tartrate  75 mg Oral BID   pantoprazole  40 mg Oral Daily   Continuous Infusions:  ampicillin-sulbactam (UNASYN) IV Stopped  (03/30/23 0551)     LOS: 1 day    Time spent: 52 minutes spent on chart review, discussion with nursing staff, consultants, updating family and interview/physical exam; more than 50% of that time was spent in counseling and/or coordination of care.    Alvira Philips [place], DO [facility] Available via Epic secure chat 7am-7pm After these hours, please refer to coverage provider listed on [email or web address] 03/30/2023, 11:30 AM

## 2023-03-30 NOTE — Evaluation (Signed)
Physical Therapy Evaluation Patient Details Name: Erin Ryan MRN: [id number] DOB: April 17, 1925 Today's Date: 03/30/2023  History of Present Illness  89 y.o. female with past medical history significant for paroxysmal atrial fibrillation on Eliquis, HTN, HLD, hypothyroidism, CKD stage IIIb, anxiety/depression, dementia who presented to [facility] ED on 7/6 via EMS from home with confusion.  Dx of severe sepsis, UTI, lactic acidocis, diarrhea.  Clinical Impression  Pt admitted with above diagnosis. +2 max assist for supine to sit. +2 mod assist for sit to stand. Pt was able to take a few shuffling steps to the recliner with a RW. She may need ST-SNF, depending on progress and family's ability to provide needed level of care upon acute DC. Pt does have a private aide during the day, and assistance from her daughter in the evenings.  Pt currently with functional limitations due to the deficits listed below (see PT Problem List). Pt will benefit from acute skilled PT to increase their independence and safety with mobility to allow discharge.           Assistance Recommended at Discharge Frequent or constant Supervision/Assistance  If plan is discharge home, recommend the following:  Can travel by private vehicle  Two people to help with walking and/or transfers;A lot of help with bathing/dressing/bathroom;Assistance with cooking/housework;Help with stairs or ramp for entrance;Assist for transportation;Direct supervision/assist for medications management;Direct supervision/assist for financial management   No    Equipment Recommendations None recommended by PT  Recommendations for Other Services       Functional Status Assessment Patient has had a recent decline in their functional status and demonstrates the ability to make significant improvements in function in a reasonable and predictable amount of time.     Precautions / Restrictions Precautions Precautions: Fall Precaution  Comments: private caregiver reports pt had a fall in the past few days Restrictions Weight Bearing Restrictions: No      Mobility  Bed Mobility Overal bed mobility: Needs Assistance Bed Mobility: Supine to Sit     Supine to sit: Max assist, +2 for physical assistance     General bed mobility comments: assist to raise trunk and to pivot hips to edge of bed    Transfers Overall transfer level: Needs assistance Equipment used: Rolling walker (2 wheels) Transfers: Sit to/from Stand, Bed to chair/wheelchair/BSC Sit to Stand: +2 safety/equipment, +2 physical assistance, Mod assist   Step pivot transfers: Min assist, +2 safety/equipment, +2 physical assistance       General transfer comment: assist to power up and steady; shuffling steps to recliner with RW, assist to guide hips to recliner, VCs hand placement    Ambulation/Gait                  Stairs            Wheelchair Mobility     Tilt Bed    Modified Rankin (Stroke Patients Only)       Balance Overall balance assessment: Needs assistance, History of Falls Sitting-balance support: Feet supported, Bilateral upper extremity supported Sitting balance-Leahy Scale: Fair     Standing balance support: Bilateral upper extremity supported, During functional activity, Reliant on assistive device for balance Standing balance-Leahy Scale: Poor                               Pertinent Vitals/Pain Pain Assessment Pain Assessment: Faces Faces Pain Scale: No hurt Breathing: normal Negative Vocalization: none Body Language: relaxed  Consolability: no need to console    Home Living Family/patient expects to be discharged to:: Private residence Living Arrangements: Children;Other relatives Available Help at Discharge: Family;Available 24 hours/day;Personal care attendant Type of Home: House Home Access: Stairs to enter   Entrance Stairs-Number of Steps: 1   Home Layout: One level Home  Equipment: Rollator (4 wheels);Cane - single point;Shower seat;Transport chair      Prior Function Prior Level of Function : Needs assist             Mobility Comments: Uses rollator ADLs Comments: PCA 3x/wk for 16 hours to assist with bathing/showers.     Hand Dominance        Extremity/Trunk Assessment   Upper Extremity Assessment Upper Extremity Assessment: Generalized weakness    Lower Extremity Assessment Lower Extremity Assessment: Generalized weakness    Cervical / Trunk Assessment Cervical / Trunk Assessment: Kyphotic  Communication   Communication: HOH  Cognition Arousal/Alertness: Awake/alert Behavior During Therapy: WFL for tasks assessed/performed Overall Cognitive Status: History of cognitive impairments - at baseline                                          General Comments      Exercises     Assessment/Plan    PT Assessment Patient needs continued PT services  PT Problem List Decreased strength;Decreased activity tolerance;Decreased balance;Decreased mobility       PT Treatment Interventions Gait training;Therapeutic exercise;Functional mobility training;Therapeutic activities;Balance training;Patient/family education    PT Goals (Current goals can be found in the Care Plan section)  Acute Rehab PT Goals Patient Stated Goal: to get stronger PT Goal Formulation: Patient unable to participate in goal setting Time For Goal Achievement: 04/13/23 Potential to Achieve Goals: Good    Frequency Min 1X/week     Co-evaluation               AM-PAC PT "6 Clicks" Mobility  Outcome Measure Help needed turning from your back to your side while in a flat bed without using bedrails?: A Lot Help needed moving from lying on your back to sitting on the side of a flat bed without using bedrails?: Total Help needed moving to and from a bed to a chair (including a wheelchair)?: Total Help needed standing up from a chair using your  arms (e.g., wheelchair or bedside chair)?: Total Help needed to walk in hospital room?: Total Help needed climbing 3-5 steps with a railing? : Total 6 Click Score: 7    End of Session Equipment Utilized During Treatment: Gait belt Activity Tolerance: Patient tolerated treatment well;Patient limited by fatigue Patient left: in chair;with call bell/phone within reach;with family/visitor present Nurse Communication: Mobility status PT Visit Diagnosis: History of falling (Z91.81);Difficulty in walking, not elsewhere classified (R26.2)    Time: 1610-9604 PT Time Calculation (min) (ACUTE ONLY): 26 min   Charges:   PT Evaluation $PT Eval Moderate Complexity: 1 Mod PT Treatments $Therapeutic Activity: 8-22 mins PT General Charges $$ ACUTE PT VISIT: 1 Visit        Tamala Ser PT 03/30/2023  Acute Rehabilitation Services  Office [phone]

## 2023-03-31 ENCOUNTER — Inpatient Hospital Stay (HOSPITAL_COMMUNITY): Payer: Medicare Other

## 2023-03-31 DIAGNOSIS — A419 Sepsis, unspecified organism: Secondary | ICD-10-CM | POA: Diagnosis not present

## 2023-03-31 DIAGNOSIS — R6520 Severe sepsis without septic shock: Secondary | ICD-10-CM | POA: Diagnosis not present

## 2023-03-31 LAB — CBC
HCT: 32.9 % — ABNORMAL LOW (ref 36.0–46.0)
Hemoglobin: 10.1 g/dL — ABNORMAL LOW (ref 12.0–15.0)
MCH: 30.8 pg (ref 26.0–34.0)
MCHC: 30.7 g/dL (ref 30.0–36.0)
MCV: 100.3 fL — ABNORMAL HIGH (ref 80.0–100.0)
Platelets: 257 10*3/uL (ref 150–400)
RBC: 3.28 MIL/uL — ABNORMAL LOW (ref 3.87–5.11)
RDW: 14.8 % (ref 11.5–15.5)
WBC: 8.6 10*3/uL (ref 4.0–10.5)
nRBC: 0 % (ref 0.0–0.2)

## 2023-03-31 LAB — BASIC METABOLIC PANEL
Anion gap: 9 (ref 5–15)
BUN: 26 mg/dL — ABNORMAL HIGH (ref 8–23)
CO2: 22 mmol/L (ref 22–32)
Calcium: 7 mg/dL — ABNORMAL LOW (ref 8.9–10.3)
Chloride: 104 mmol/L (ref 98–111)
Creatinine, Ser: 0.95 mg/dL (ref 0.44–1.00)
GFR, Estimated: 55 mL/min — ABNORMAL LOW (ref >60)
Glucose, Bld: 89 mg/dL (ref 70–99)
Potassium: 4 mmol/L (ref 3.5–5.1)
Sodium: 135 mmol/L (ref 135–145)

## 2023-03-31 LAB — LACTIC ACID, PLASMA: Lactic Acid, Venous: 2.6 mmol/L (ref 0.5–1.9)

## 2023-03-31 LAB — MAGNESIUM: Magnesium: 2.4 mg/dL (ref 1.7–2.4)

## 2023-03-31 LAB — PROCALCITONIN: Procalcitonin: 0.15 ng/mL

## 2023-03-31 LAB — PHOSPHORUS: Phosphorus: 2.6 mg/dL (ref 2.5–4.6)

## 2023-03-31 MED ORDER — HALOPERIDOL LACTATE 5 MG/ML IJ SOLN
2.0000 mg | Freq: Once | INTRAMUSCULAR | Status: AC
  Administered 2023-03-31: 2 mg via INTRAVENOUS
  Filled 2023-03-31: qty 1

## 2023-03-31 MED ORDER — FUROSEMIDE 10 MG/ML IJ SOLN
40.0000 mg | Freq: Every day | INTRAMUSCULAR | Status: DC
  Administered 2023-03-31 – 2023-04-01 (×2): 40 mg via INTRAVENOUS
  Filled 2023-03-31 (×2): qty 4

## 2023-03-31 MED ORDER — HALOPERIDOL LACTATE 5 MG/ML IJ SOLN
1.0000 mg | Freq: Four times a day (QID) | INTRAMUSCULAR | Status: DC | PRN
  Administered 2023-03-31 – 2023-04-01 (×2): 1 mg via INTRAVENOUS
  Filled 2023-03-31 (×2): qty 1

## 2023-03-31 NOTE — Consult Note (Signed)
Consultation Note Date: 03/31/2023   [name]  DOB: [birth year]  MRN: [id number]  Age / Sex: 89 y.o., female  PCP: [name], MD Referring Physician: [place], Eric J, DO  Reason for Consultation: Establishing goals of care  HPI/Patient Profile: 89 y.o. female admitted on 03/28/2023   Clinical Assessment and Goals of Care: 89 year old lady who lives at home with her daughter along with caregiver support.  Patient was seen and evaluated by palliative services in March 2024 when she was admitted with a COVID infection.  At that time, goals of care discussions were undertaken and the concept of exploring home with hospice services was brought up. Patient has a past medical history of paroxysmal atrial fibrillation, she is on Eliquis, history of hypertension dyslipidemia hypothyroidism stage IIIb chronic kidney disease anxiety depression and dementia. Patient has been admitted with the confusion different than her typical baseline from underlying dementia, was a found to have severe sepsis urinary tract infection present on admission, was found to have community-acquired pneumonia and lactic acidosis. Has a past medical history of paroxysmal atrial fibrillation, she is on Eliquis, history of hypertension dyslipidemia hypothyroidism stage IIIb chronic kidney disease anxiety depression and dementia. Patient has been admitted with the confusion different than her typical baseline from underlying dementia, was a found to have severe sepsis urinary tract infection present on admission, was found to have community-acquired pneumonia and lactic acidosis. Patient was admitted to hospital medicine service and was started on diuretics and antibiotics alongside supplemental oxygen. Overall, patient continues with gradual progressive ongoing functional status decline, diminishing oral intake and palliative consult  has since been requested for ongoing goals of care discussions. Chart reviewed, patient seen, discussed with caregiver briefly who was present at bedside with regards to the patient's baseline functional status and what a typical day looks like for the patient. Palliative medicine is specialized medical care for people living with serious illness. It focuses on providing relief from the symptoms and stress of a serious illness. The goal is to improve quality of life for both the patient and the family. Goals of care: Broad aims of medical therapy in relation to the patient's values and preferences. Our aim is to provide medical care aimed at enabling patients to achieve the goals that matter most to them, given the circumstances of their particular medical situation and their constraints.    HCPOA Daughter [name] [phone]. Call placed but unable to reach at this time. SUMMARY OF RECOMMENDATIONS   Goals of care discussions: Chart reviewed, discussed in an interdisciplinary fashion with hospital medicine attending physician, [facility] colleague: Previous palliative consultation report also reviewed, scope of current hospitalization reviewed. PT OT notes reviewed. At this time, it appears that the patient's daughter had made a decision to proceed with home hospice services on discharge.  Agree completely with this decision.  Recommend addition of hospice support and recommend continuation of care inside the patient's home-patient's usual familiar environment.  Continue current mode of care.  It appears that the patient has  had gradual progressive decline over the course of the past few months.  She has serious underlying diagnosis of dementia and heart disease and stage III renal failure. Recommend home with hospice services after the patient's daughter decides on local hospice organization. Thank you for the consult.  Code Status/Advance Care Planning: DNR   Symptom Management:     Palliative Prophylaxis:  Delirium Protocol  Psycho-social/Spiritual:  Desire for further Chaplaincy support:yes Additional Recommendations: Caregiving  Support/Resources  Prognosis:  Guarded, patient with ongoing functional status decline, prognosis likely less than 6 months-hospice eligible.  Discharge Planning: Home with Hospice      Primary Diagnoses: Present on Admission:  Severe sepsis (HCC)   I have reviewed the medical record, interviewed the patient and family, and examined the patient. The following aspects are pertinent.  Past Medical History:  Diagnosis Date   A-fib [facility])    Anxiety    Arthritis    COVID-19    04/2021   Dementia (HCC)    GERD (gastroesophageal reflux disease)    Hearing loss    has hearing aids   High cholesterol    Hypertension    Hypothyroid    Osteoporosis    Scoliosis    Thyroid disease    Social History   Socioeconomic History   Marital status: Widowed    Spouse name: Not on file   Number of children: 1   Years of education: Not on file   Highest education level: Not on file  Occupational History   Occupation: Retired    Associate Professor: RETIRED    Comment: Metallurgist  Tobacco Use   Smoking status: Never   Smokeless tobacco: Never  Vaping Use   Vaping Use: Never used  Substance and Sexual Activity   Alcohol use: No   Drug use: No   Sexual activity: Not Currently  Other Topics Concern   Not on file  Social History Narrative   Lives with daughter.    Has 1 daughter, [name], and 2 grandkids   Social Determinants of Health   Financial Resource Strain: Not on file  Food Insecurity: No Food Insecurity (03/29/2023)   Hunger Vital Sign    Worried About Running Out of Food in the Last Year: Never true    Ran Out of Food in the Last Year: Never true  Transportation Needs: No Transportation Needs (03/29/2023)   PRAPARE - Administrator, Civil Service (Medical): No    Lack of Transportation (Non-Medical): No   Physical Activity: Not on file  Stress: Not on file  Social Connections: Not on file   Family History  Problem Relation Age of Onset   Heart disease Mother 44       Heart attack   Bladder Cancer Mother    Heart disease Father    Asthma Father    Heart attack Brother        Age undetermined   Scheduled Meds:  apixaban  2.5 mg Oral BID   atorvastatin  40 mg Oral Daily   Chlorhexidine Gluconate Cloth  6 each Topical Q0600   donepezil  10 mg Oral Daily   DULoxetine  60 mg Oral Daily   furosemide  40 mg Intravenous Daily   levothyroxine  112 mcg Oral Q0600   melatonin  5 mg Oral QHS   metoprolol tartrate  5 mg Intravenous Once   metoprolol tartrate  75 mg Oral BID   pantoprazole  40 mg Oral Daily   Continuous Infusions:  ampicillin-sulbactam (UNASYN) IV Stopped (03/31/23 0610)   PRN Meds:.acetaminophen, ipratropium-albuterol, polyvinyl alcohol, prochlorperazine Medications Prior to Admission:  Prior to Admission medications   Medication Sig Start Date End Date Taking? Authorizing Provider  Acetaminophen (TYLENOL EXTRA STRENGTH PO) Take 1,000 mg by mouth 3 (three) times daily as needed (knee pain).   Yes [provider]  amLODipine (NORVASC) 5 MG tablet Take 5 mg by mouth in the morning.   Yes [provider]  apixaban (ELIQUIS) 2.5 MG TABS tablet Take 1 tablet (2.5 mg total) by mouth 2 (two) times daily. 11/28/22  Yes Croitoru, Mihai, MD  atorvastatin (LIPITOR) 40 MG tablet Take 40 mg by mouth daily.   Yes [provider]  Calcium Citrate-Vitamin D (CALCIUM CITRATE + PO) Take 1 tablet 2 (two) times daily by mouth.   Yes [provider]  denosumab (PROLIA) 60 MG/ML SOSY injection Inject 60 mg into the skin every 6 (six) months. 06/24/17  Yes [provider]  donepezil (ARICEPT) 10 MG tablet Take 1 tablet (10 mg total) by mouth at bedtime. Patient taking differently: Take 10 mg by mouth in the morning. 05/20/22  Yes [name], NP   DULoxetine (CYMBALTA) 60 MG capsule Take 60 mg by mouth every evening.   Yes [provider]  furosemide (LASIX) 20 MG tablet Take 2 tablets (40 mg total) by mouth daily. 01/29/23  Yes Rollene Rotunda, MD  gabapentin (NEURONTIN) 100 MG capsule Take 200 mg by mouth at bedtime. 05/07/22  Yes [provider]  lactose free nutrition (BOOST PLUS) LIQD Take 237 mLs by mouth 2 (two) times daily between meals. 08/19/22  Yes [name], MD  levothyroxine (SYNTHROID) 112 MCG tablet Take 112 mcg by mouth daily before breakfast. 01/20/23  Yes [provider]  Metoprolol Tartrate 75 MG TABS Take 1 tablet (75 mg total) by mouth 2 (two) times daily. 11/28/22  Yes Croitoru, Mihai, MD  mirtazapine (REMERON) 15 MG tablet Take 15 mg by mouth at bedtime.   Yes [provider]  Multiple Vitamins-Minerals (CENTRUM SILVER PO) Take 1 tablet by mouth daily.   Yes [provider]  omeprazole (PRILOSEC) 40 MG capsule Take 1 capsule (40 mg total) by mouth 2 (two) times daily before a meal. Patient taking differently: Take 40 mg by mouth daily. 09/11/17  Yes [name], MD  polyvinyl alcohol (LIQUIFILM TEARS) 1.4 % ophthalmic solution Place 1 drop into both eyes as needed for dry eyes. 08/19/22  Yes [name], MD  amLODipine (NORVASC) 2.5 MG tablet Take 1 tablet (2.5 mg total) by mouth daily. Patient not taking: Reported on 03/29/2023 12/19/22   Croitoru, Rachelle Hora, MD  levothyroxine (SYNTHROID) 100 MCG tablet Take 1 tablet (100 mcg total) by mouth in the morning. DAW1 Synthroid Patient not taking: Reported on 03/29/2023 08/19/22   [name], MD  mirtazapine (REMERON) 7.5 MG tablet Take 1 tablet (7.5 mg total) by mouth at bedtime. Patient not taking: Reported on 03/29/2023 12/23/22   Almon Hercules, MD   Allergies  Allergen Reactions   Ambrosia Trifida (Tall Ragweed) Allergy Skin Test Other (See Comments)   Hydrochlorothiazide Other (See Comments)    hyponatremia    Lyrica [Pregabalin] Other (See Comments)    Severe confusion   Diltiazem Hcl Er Rash   Short Ragweed Pollen Ext Other (See Comments)   Review of Systems + weakness Physical Exam Monitor noted Weak appearing lady resting in bed Chronically appearing, elderly appearing lady resting in bed  Mild lower extremity edema Is on supplemental oxygen via nasal cannula  Vital Signs: BP (!) 121/92   Pulse 75   Temp 98.9 F (37.2 C) (Oral)   Resp 19   Ht 5\' 1"  (1.549 m)   Wt 65.5 kg   SpO2 92%   BMI 27.28 kg/m  Pain Scale: PAINAD   Pain Score: 0-No pain   SpO2: SpO2: 92 % O2 Device:SpO2: 92 % O2 Flow Rate: .O2 Flow Rate (L/min): 8 L/min  IO: Intake/output summary:  Intake/Output Summary (Last 24 hours) at 03/31/2023 1432 Last data filed at 03/31/2023 0600 Gross per 24 hour  Intake 266.34 ml  Output 700 ml  Net -433.66 ml    LBM:   Baseline Weight: Weight: 62.7 kg Most recent weight: Weight: 65.5 kg     Palliative Assessment/Data:   Palliative performance scale 40%.  Time In: 1330 Time Out:  1430 Time Total:  60 Greater than 50%  of this time was spent counseling and coordinating care related to the above assessment and plan.  Signed by: Rosalin Hawking, MD   Please contact Palliative Medicine Team phone at [phone] for questions and concerns.  For individual provider: See Loretha Stapler

## 2023-03-31 NOTE — Progress Notes (Addendum)
PROGRESS NOTE    Erin Ryan  ZOX:[id number] DOB: [birth year] DOA: 03/28/2023 PCP: [name], MD    Brief Narrative:   Erin Ryan is a 89 y.o. female with past medical history significant for paroxysmal atrial fibrillation on Eliquis, HTN, HLD, hypothyroidism, CKD stage IIIb, anxiety/depression, dementia who presented to [facility] ED on 7/6 via EMS from home with confusion.  Confusion is different than her typical baseline from underlying dementia.  Daughter also reports abdominal pain, diarrhea.  Recently treated for urinary tract infection.  Daughter concerned about possible viral illness.  In the ED, temperature 102.4 F, HR 108, RR 28, BP 144/103, SpO2 96% on room air.  WBC 10.2, hemoglobin 12.2, platelets 301.  Sodium 140, potassium 3.8, chloride 103, CO2 22, glucose 153, BUN 39, creatinine 1.46.  AST 34, ALT 25, total bilirubin 0.5.  Lactic acid 4.3.  Urinalysis with moderate leukocytes, negative nitrite, many bacteria, greater than 50 WBCs.  C. difficile PCR negative.  Chest x-ray with airspace disease mid to lower left lung consistent with edema versus infiltrate, small left pleural effusion.  CT abdomen/pelvis with contrast with no acute localizing process in the abdomen/pelvis, small bilateral pleural effusions with bibasilar atelectasis, colonic/small bowel diverticulosis without evidence of diverticulitis, stable left renal atrophy, aortic atherosclerosis.  Patient was given IV fluid bolus, started on empiric antibiotics by EDP.  TRH consulted for admission for further evaluation management of severe sepsis secondary to pneumonia versus UTI.  Assessment & Plan:   Severe sepsis, POA Urinary tract infection Community-acquired pneumonia Lactic acidosis Patient presenting to ED with confusion worse than her typical baseline.  Patient was noted to be febrile with temperature 102.4 F, tachycardic, tachypneic with end organ damage consistent with altered mental status/acute  metabolic encephalopathy from underlying infection.  WBC count 10.2.  Procalcitonin 0.16.  Urinalysis with moderate leukocytes, negative nitrite, many bacteria, greater than 50 WBCs.  Chest x-ray with concerning of infiltrates on left. -- WBC 10.2>10.2>7.0>8.6 -- Lactic acid 4.3>6.1>5.2>1.7>5.3>2.6 -- Blood cultures x 2: No growth x 1 day -- Urine culture: Pending -- Unasyn 3 g IV every 8 hours -- Lasix 40 mg IV daily -- Continue supplemental oxygen, maintain SpO2 greater than 92%; on 6 L nasal cannula with SpO2 92% at rest -- Follow CBC, lactic acid, procalcitonin daily  Diarrhea 2/2 norovirus Daughter reports patient with episode of diarrhea prior to admission.  No recent antibiotic exposure; but daughter reports similar symptoms.  C. difficile PCR negative.  GI PCR panel positive for norovirus. -- Monitor bowel movements, strict I's and O's -- Continue enteric precautions for now  Hypokalemia Repleted. -- Repeat electrolytes in the a.m.  Bladder outlet obstruction Patient continues inability to void, s/p several In-N-Out catheterizations. -- Place Foley catheter today  Essential hypertension Home medications include amlodipine 5 mg p.o. daily, furosemide 40 mg p.o. daily, metoprolol tartrate 75 mg p.o. twice daily. -- BP 140/81 this morning -- Metoprolol tartrate 75 mg p.o. twice daily -- Continue to hold home furosemide, amlodipine for now; given the hypotension earlier in admission -- Continue monitor BP closely  Paroxysmal atrial fibrillation -- Metoprolol tartrate 75 mg p.o. twice daily -- Continue Eliquis 2.5 mg p.o. twice daily  Hyperlipidemia -- atorvastatin 40 mg p.o. daily for now  Hypothyroidism -- Levothyroxine 112 mcg p.o. daily  CKD stage IIIb -- Cr 1.46>1.20>0.89, stable -- BMP daily  Dementia --Delirium precautions --Get up during the day --Encourage a familiar face to remain present throughout the day --Keep blinds open and  lights on during daylight  hours --Minimize the use of opioids/benzodiazepines --Donepezil 10 mg p.o. every morning --Melatonin 5 mg p.o. nightly as needed  Goals of care: Daughter reports that she has noted a significant decline in her mother over the past several months, not eating as well with increased confusion.  Given her advanced age, comorbidities and overall quality of life being poor would recommend transitioning to hospice care at time of discharge.  Daughter having hard time contemplating these decisions and does lack some insight into her overall prognosis. --Palliative care consult for assistance with goals of care and medical decision making    DVT prophylaxis: apixaban (ELIQUIS) tablet 2.5 mg Start: 03/29/23 0600 apixaban (ELIQUIS) tablet 2.5 mg    Code Status: DNR Family Communication: Updated daughter present at bedside this morning  Disposition Plan:  Level of care: Stepdown Status is: Inpatient Remains inpatient appropriate because: IV antibiotics    Consultants:  Palliative care  Procedures:  None  Antimicrobials:  Vancomycin 7/6 - 7/6 Cefepime 7/6 - 7/6 Unasyn 7/7>>   Subjective: Patient seen examined at bedside, lying in bed.  Sleeping but arousable.  Daughter present at bedside.  Discussed with daughter extensively once again this morning regarding her current response to treatments which has been poor so far.  Discussed will continue aggressive measures for another 1-2 days and if no significant improvement may need to consider transitioning to a more palliative/comfort approach with hospice; and will involve palliative care today.   Unable to obtain any further ROS from patient given her encephalopathy/somnolence from benzos.  No acute concerns overnight per nursing staff.  Objective: Vitals:   03/31/23 0500 03/31/23 0600 03/31/23 0800 03/31/23 0924  BP: 118/72 (!) 140/81  (!) 121/92  Pulse: 73 78  75  Resp: 17 19    Temp:   98.2 F (36.8 C)   TempSrc:   Axillary   SpO2:  (!) 89% 92%  92%  Weight: 65.5 kg     Height:        Intake/Output Summary (Last 24 hours) at 03/31/2023 1018 Last data filed at 03/31/2023 0600 Gross per 24 hour  Intake 266.34 ml  Output 700 ml  Net -433.66 ml   Filed Weights   03/29/23 0800 03/30/23 0500 03/31/23 0500  Weight: 62.7 kg 64.4 kg 65.5 kg    Examination:  Physical Exam: GEN: NAD, somnolent, chronically ill/elderly in appearance HEENT: NCAT, PERRL, EOMI, sclera clear, dry mucous membranes PULM: Breath sounds diminished bilateral bases, slight crackles, no wheezing, normal respiratory effort without accessory muscle use, on 6 L nasal cannula CV: RRR w/o M/G/R GI: abd soft, NTND, NABS, no R/G/M MSK: Trace lower extremity peripheral edema NEURO: Somnolent Integumentary: No concerning rashes/lesions/wounds noted on exposed skin surfaces.    Data Reviewed: I have personally reviewed following labs and imaging studies  CBC: Recent Labs  Lab 03/28/23 2118 03/29/23 0500 03/30/23 0244 03/31/23 0313  WBC 10.2 10.2 7.0 8.6  NEUTROABS 9.3*  --   --   --   HGB 12.2 10.1* 9.1* 10.1*  HCT 38.7 31.9* 28.8* 32.9*  MCV 97.5 100.9* 99.0 100.3*  PLT 301 245 205 257   Basic Metabolic Panel: Recent Labs  Lab 03/28/23 2118 03/29/23 0500 03/30/23 0244 03/31/23 0313  NA 140 138 135 135  K 3.8 3.5 3.0* 4.0  CL 103 105 105 104  CO2 22 23 18* 22  GLUCOSE 153* 110* 94 89  BUN 39* 34* 26* 26*  CREATININE 1.46* 1.20* 0.89 0.95  CALCIUM 8.2* 7.5* 7.0* 7.0*  MG  --  1.5* 2.3 2.4  PHOS  --  3.5  --  2.6   GFR: Estimated Creatinine Clearance: 30 mL/min (by C-G formula based on SCr of 0.95 mg/dL). Liver Function Tests: Recent Labs  Lab 03/28/23 2118  AST 34  ALT 25  ALKPHOS 69  BILITOT 0.5  PROT 7.1  ALBUMIN 3.0*   No results for input(s): "LIPASE", "AMYLASE" in the last 168 hours. No results for input(s): "AMMONIA" in the last 168 hours. Coagulation Profile: No results for input(s): "INR", "PROTIME" in the last  168 hours. Cardiac Enzymes: No results for input(s): "CKTOTAL", "CKMB", "CKMBINDEX", "TROPONINI" in the last 168 hours. BNP (last 3 results) No results for input(s): "PROBNP" in the last 8760 hours. HbA1C: No results for input(s): "HGBA1C" in the last 72 hours. CBG: Recent Labs  Lab 03/29/23 1722  GLUCAP 100*   Lipid Profile: No results for input(s): "CHOL", "HDL", "LDLCALC", "TRIG", "CHOLHDL", "LDLDIRECT" in the last 72 hours. Thyroid Function Tests: No results for input(s): "TSH", "T4TOTAL", "FREET4", "T3FREE", "THYROIDAB" in the last 72 hours. Anemia Panel: No results for input(s): "VITAMINB12", "FOLATE", "FERRITIN", "TIBC", "IRON", "RETICCTPCT" in the last 72 hours. Sepsis Labs: Recent Labs  Lab 03/28/23 2116 03/28/23 2118 03/29/23 0210 03/29/23 1610 03/30/23 0244 03/30/23 0528 03/31/23 0313  PROCALCITON 0.16  --   --   --   --  0.17 0.15  LATICACIDVEN  --    < > 5.2* 1.7 5.3*  --  2.6*   < > = values in this interval not displayed.    Recent Results (from the past 240 hour(s))  Blood culture (routine x 2)     Status: None (Preliminary result)   Collection Time: 03/28/23  9:18 PM   Specimen: BLOOD RIGHT FOREARM  Result Value Ref Range Status   Specimen Description   Final    BLOOD RIGHT FOREARM Performed at [facility], [address] [place], [place] [address]    Special Requests   Final    Blood Culture adequate volume BOTTLES DRAWN AEROBIC AND ANAEROBIC Performed at [facility], [address] [place], [place] [address]    Culture   Final    NO GROWTH 2 DAYS Performed at [facility], [address], [place], [place] [address]    Report Status PENDING  Incomplete  Culture, blood (Routine X 2) w Reflex to ID Panel     Status: None (Preliminary result)   Collection Time: 03/28/23 10:01 PM   Specimen: BLOOD RIGHT FOREARM  Result Value Ref Range Status   Specimen Description   Final    BLOOD RIGHT FOREARM Performed at  [facility], [address], [place], [place] [address]    Special Requests   Final    BOTTLES DRAWN AEROBIC AND ANAEROBIC Blood Culture adequate volume Performed at [facility], [address], [place], [place] [address]    Culture   Final    NO GROWTH 2 DAYS Performed at [facility], [address], [place], [place] [address]    Report Status PENDING  Incomplete  MRSA Next Gen by PCR, Nasal     Status: None   Collection Time: 03/29/23  3:05 AM   Specimen: Nasal Mucosa; Nasal Swab  Result Value Ref Range Status   MRSA by PCR Next Gen NOT DETECTED NOT DETECTED Final    Comment: (NOTE) The GeneXpert MRSA Assay (FDA approved for  NASAL specimens only), is one component of a comprehensive MRSA colonization surveillance program. It is not intended to diagnose MRSA infection nor to guide or monitor treatment for MRSA infections. Test performance is not FDA approved in patients less than 41 years old. Performed at [facility], [address] [place], [place] [address]   Gastrointestinal Panel by PCR , Stool     Status: Abnormal   Collection Time: 03/29/23  4:54 AM   Specimen: Stool  Result Value Ref Range Status   Campylobacter species NOT DETECTED NOT DETECTED Final   Plesimonas shigelloides NOT DETECTED NOT DETECTED Final   Salmonella species NOT DETECTED NOT DETECTED Final   Yersinia enterocolitica NOT DETECTED NOT DETECTED Final   Vibrio species NOT DETECTED NOT DETECTED Final   Vibrio cholerae NOT DETECTED NOT DETECTED Final   Enteroaggregative E coli (EAEC) NOT DETECTED NOT DETECTED Final   Enteropathogenic E coli (EPEC) NOT DETECTED NOT DETECTED Final   Enterotoxigenic E coli (ETEC) NOT DETECTED NOT DETECTED Final   Shiga like toxin producing E coli (STEC) NOT DETECTED NOT DETECTED Final   Shigella/Enteroinvasive E coli (EIEC) NOT DETECTED NOT DETECTED Final   Cryptosporidium NOT DETECTED NOT DETECTED Final   Cyclospora  cayetanensis NOT DETECTED NOT DETECTED Final   Entamoeba histolytica NOT DETECTED NOT DETECTED Final   Giardia lamblia NOT DETECTED NOT DETECTED Final   Adenovirus F40/41 NOT DETECTED NOT DETECTED Final   Astrovirus NOT DETECTED NOT DETECTED Final   Norovirus GI/GII DETECTED (A) NOT DETECTED Final    Comment: CRITICAL RESULT CALLED TO, READ BACK BY AND VERIFIED WITH: ASHLEY ROBBINS 03/30/23 @ 0735 BY SH    Rotavirus A NOT DETECTED NOT DETECTED Final   Sapovirus (I, II, IV, and V) NOT DETECTED NOT DETECTED Final    Comment: Performed at [facility], [address] [place], [place] [address]  C Difficile Quick Screen w PCR reflex     Status: None   Collection Time: 03/29/23  4:54 AM   Specimen: Stool  Result Value Ref Range Status   C Diff antigen NEGATIVE NEGATIVE Final   C Diff toxin NEGATIVE NEGATIVE Final   C Diff interpretation No C. difficile detected.  Final    Comment: Performed at [facility], [address], [place], [place] [address]         Radiology Studies: No results found.      Scheduled Meds:  apixaban  2.5 mg Oral BID   atorvastatin  40 mg Oral Daily   Chlorhexidine Gluconate Cloth  6 each Topical Q0600   donepezil  10 mg Oral Daily   DULoxetine  60 mg Oral Daily   furosemide  40 mg Intravenous Daily   levothyroxine  112 mcg Oral Q0600   melatonin  5 mg Oral QHS   metoprolol tartrate  5 mg Intravenous Once   metoprolol tartrate  75 mg Oral BID   pantoprazole  40 mg Oral Daily   Continuous Infusions:  ampicillin-sulbactam (UNASYN) IV Stopped (03/31/23 0610)     LOS: 2 days    Time spent: 52 minutes spent on chart review, discussion with nursing staff, consultants, updating family and interview/physical exam; more than 50% of that time was spent in counseling and/or coordination of care.    Alvira Philips [place], DO [facility] Available via Epic secure chat 7am-7pm After these hours, please refer to coverage  provider listed on [email or web address] 03/31/2023, 10:18 AM

## 2023-03-31 NOTE — Progress Notes (Signed)
Pt has managed to pull the nasal cannula out of her nose multiple times. This nurse provided redirection and verbally implied the importance of keeping the nasal cannula in her nose to pt. Safety mitts was placed on pt to keep pt from pulling at the nasal cannula. Daughter asked this nurse about the administration of ativan, daughter was then informed from this nurse that the previous order of ativan was discontinued.

## 2023-03-31 NOTE — Evaluation (Signed)
Occupational Therapy Evaluation Patient Details Name: Erin Ryan MRN: [id number] DOB: [birth year] Today's Date: 03/31/2023   History of Present Illness 89 y.o. female with past medical history significant for paroxysmal atrial fibrillation on Eliquis, HTN, HLD, hypothyroidism, CKD stage IIIb, anxiety/depression, dementia who presented to [facility] ED on 7/6 via EMS from home with confusion.  Dx of severe sepsis, UTI, lactic acidocis, diarrhea.   Clinical Impression   At time of writing up evaluation, daughter expressed that pt may go on home hospice but daughter does not feel that she cn safely care for pt currently with heavy assistance needs.   Patient is currently requiring assistance with ADLs including up to total assist with Lower body ADLs, up to moderate assist with Upper body ADLs,  as well as  Max assist with bed mobility and Mod assist with functional transfers to toilet using Corene Cornea.  Current level of function is below patient's typical baseline where she is assisted by PCA or daughter with all ADLs and mobility but to a lesser extend that 1 person can safely perform.   During this evaluation, patient was limited by generalized weakness, impaired activity tolerance, OA deformities to hands with limited UE ROM and baseline cognitive and hearing deficits, all of which has the potential to impact patient's safety and independence during functional mobility, as well as performance for ADLs.  Patient lives at home, with her daughter who is able to provide 24/7 supervision and assistance with help from pt's PCA.  Patient demonstrates fair rehab potential, and should benefit from continued skilled occupational therapy services while in acute care to maximize safety, independence and quality of life at home.  Continued occupational therapy services are recommended.  ?       Recommendations for follow up therapy are one component of a multi-disciplinary discharge planning process,  led by the attending physician.  Recommendations may be updated based on patient status, additional functional criteria and insurance authorization.   Assistance Recommended at Discharge Frequent or constant Supervision/Assistance  Patient can return home with the following Assistance with feeding;Help with stairs or ramp for entrance;Two people to help with walking and/or transfers;Assistance with cooking/housework;Assist for transportation;Direct supervision/assist for financial management;Direct supervision/assist for medications management;A lot of help with bathing/dressing/bathroom    Functional Status Assessment  Patient has had a recent decline in their functional status and demonstrates the ability to make significant improvements in function in a reasonable and predictable amount of time.  Equipment Recommendations   (Will defer for now. Family is discussing hospice with MD)    Recommendations for Other Services       Precautions / Restrictions Precautions Precautions: Fall Restrictions Weight Bearing Restrictions: No      Mobility Bed Mobility Overal bed mobility: Needs Assistance Bed Mobility: Supine to Sit, Sit to Supine     Supine to sit: Max assist Sit to supine: Max assist        Transfers                          Balance Overall balance assessment: History of Falls, Needs assistance Sitting-balance support: Feet supported, No upper extremity supported Sitting balance-Leahy Scale: Poor   Postural control: Posterior lean Standing balance support: Reliant on assistive device for balance, Bilateral upper extremity supported Standing balance-Leahy Scale: Poor  ADL either performed or assessed with clinical judgement   ADL Overall ADL's : Needs assistance/impaired Eating/Feeding: Minimal assistance;Bed level Eating/Feeding Details (indicate cue type and reason): Pt uses foam adaptive grips at home and  daighter stated these work well. Pt not observed eating but would anticipate at least Min As. Grooming: Therapist, nutritional;Moderate assistance;Sitting Grooming Details (indicate cue type and reason): Posterior LOB at EOB once pt tried to use Bil hands for face wash, requiring Mod As for balance safety. Upper Body Bathing: Bed level;Moderate assistance   Lower Body Bathing: Maximal assistance;Bed level   Upper Body Dressing : Moderate assistance;Sitting   Lower Body Dressing: Total assistance;Sitting/lateral leans Lower Body Dressing Details (indicate cue type and reason): To don socks. Toilet Transfer: Moderate assistance;Cueing for sequencing;Cueing for safety Toilet Transfer Details (indicate cue type and reason): Pt stood from EOB to [facility] with light Moderate assist of 1 and cues. Pt able to lower self back to EOB with Min As. Toileting- Clothing Manipulation and Hygiene: Total assistance;Sit to/from stand Toileting - Clothing Manipulation Details (indicate cue type and reason): Pt found to be lightly soiled and total assist posterior hygiene provided while pt stood at [place].     Functional mobility during ADLs: Moderate assistance;Cueing for sequencing;Cueing for safety       Vision   Additional Comments: Difficult to assess due to dementia.     Perception     Praxis      Pertinent Vitals/Pain Pain Assessment Pain Assessment: PAINAD Breathing: normal Negative Vocalization: none Facial Expression: smiling or inexpressive Body Language: relaxed Consolability: no need to console PAINAD Score: 0 Pain Location: Pt did not complain of any pain Pain Intervention(s): Monitored during session, Repositioned     Hand Dominance Right   Extremity/Trunk Assessment Upper Extremity Assessment Upper Extremity Assessment: Generalized weakness;RUE deficits/detail;LUE deficits/detail RUE Deficits / Details: OA and trigger fingers on hands. Cannot fully entend fingers. Shoulder limitations  to about 80-90 degrees. RUE Coordination: decreased fine motor LUE Deficits / Details: OA and trigger fingers on hands. Cannot fully entend fingers. Shoulder limitations to about 80-90 degrees. LUE Coordination: decreased fine motor           Communication Communication Communication: HOH   Cognition Arousal/Alertness: Awake/alert Behavior During Therapy: WFL for tasks assessed/performed Overall Cognitive Status: History of cognitive impairments - at baseline Area of Impairment: Orientation, Memory, Following commands, Awareness, Safety/judgement, Problem solving, Attention                 Orientation Level: Disoriented to, Place, Time, Situation (not to DOB-year) Current Attention Level: Sustained Memory: Decreased short-term memory (dementia) Following Commands: Follows one step commands inconsistently (Severely HOH) Safety/Judgement: Decreased awareness of safety, Decreased awareness of deficits Awareness: Intellectual Problem Solving: Slow processing, Difficulty sequencing, Requires verbal cues, Requires tactile cues       General Comments       Exercises     Shoulder Instructions      Home Living Family/patient expects to be discharged to:: Private residence Living Arrangements: Children;Other relatives Available Help at Discharge: Family;Available 24 hours/day;Personal care attendant Type of Home: House Home Access: Stairs to enter [employer] of Steps: 1   Home Layout: One level     Bathroom Shower/Tub: Tub/shower unit;Walk-in shower   Bathroom Toilet: Handicapped height Bathroom Accessibility: Yes How Accessible: Accessible via walker Home Equipment: Rollator (4 wheels);Cane - single point;Shower seat;Transport chair   Additional CommentsEmergency planning/management officer      Prior Functioning/Environment Prior Level of Function : Needs assist  Cognitive Assist : ADLs (cognitive);Mobility (cognitive) Mobility (Cognitive): Step by step cues ADLs  (Cognitive): Step by step cues Physical Assist : ADLs (physical)   ADLs (physical): Toileting;IADLs;Dressing;Bathing Mobility Comments: Uses rollator, needs help out of bed some days. ADLs Comments: PCA 3x/wk for 16 hours to assist with bathing/showers and dressing.  PEr daughter in room, pt can feed and groom self seated with supervision/cues/setup. Assisted for toileting.        OT Problem List: Decreased strength;Decreased range of motion;Decreased safety awareness;Decreased activity tolerance;Decreased knowledge of use of DME or AE;Impaired UE functional use;Impaired balance (sitting and/or standing);Decreased knowledge of precautions;Cardiopulmonary status limiting activity;Decreased coordination      OT Treatment/Interventions: Self-care/ADL training;Therapeutic activities;Therapeutic exercise;Patient/family education;Balance training;DME and/or AE instruction    OT Goals(Current goals can be found in the care plan section) Acute Rehab OT Goals OT Goal Formulation: Patient unable to participate in goal setting (Dtr unsure at this time.) Time For Goal Achievement: 04/14/23 Potential to Achieve Goals: Fair ADL Goals Pt Will Perform Eating: with set-up;with supervision;sitting Pt Will Perform Grooming: sitting (At EOB with at least fair balance without UEs for support.) Pt Will Transfer to Toilet: with min guard assist;ambulating Pt Will Perform Toileting - Clothing Manipulation and hygiene: with min assist;sitting/lateral leans;sit to/from stand Additional ADL Goal #1: Pt will perform bed mobility to prepare for ADLs with no more than Min As to reduce caregiver burden  OT Frequency: Min 1X/week    Co-evaluation              AM-PAC OT "6 Clicks" Daily Activity     Outcome Measure Help from another person eating meals?: A Lot Help from another person taking care of personal grooming?: A Lot Help from another person toileting, which includes using toliet, bedpan, or urinal?:  Total Help from another person bathing (including washing, rinsing, drying)?: A Lot Help from another person to put on and taking off regular upper body clothing?: A Lot Help from another person to put on and taking off regular lower body clothing?: Total 6 Click Score: 10   End of Session Equipment Utilized During Treatment: Gait belt;Oxygen Nurse Communication: Mobility status  Activity Tolerance: Patient tolerated treatment well (Knee pain with prolonged standing at [facility]) Patient left: in bed;with call bell/phone within reach;with bed alarm set;with family/visitor present  OT Visit Diagnosis: Unsteadiness on feet (R26.81);Muscle weakness (generalized) (M62.81);History of falling (Z91.81);Other symptoms and signs involving cognitive function                Time: 1610-9604 OT Time Calculation (min): 30 min Charges:  OT General Charges $OT Visit: 1 Visit OT Evaluation $OT Eval Low Complexity: 1 Low OT Treatments $Therapeutic Activity: 8-22 mins  Victorino Dike, OT Acute Rehab Services Office: [phone] 03/31/2023  Theodoro Clock 03/31/2023, 9:34 AM

## 2023-03-31 NOTE — TOC Initial Note (Addendum)
Transition of Care [facility]) - Initial/Assessment Note    Patient Details  Name: Erin Ryan MRN: [id number] Date of Birth: November 23, 1924  Transition of [facility]) CM/SW Contact:    Lavenia Atlas, RN Phone Number: 03/31/2023, 2:28 PM  Clinical Narrative:  Received TOC consult for LTAC placement. Per chart review patient currently in [facility] SDU for severe sepsis, PMH of dementia. This RNCM spoke with patient's daughter Erin Braun to explain and offer choice for LTAC. Patient's daughter reports she prefers patient discharge with home hospice. This RNCM inquired which agency however patient's daughter reports she needs to speak with a family member then will let this RNCM know which agency. Notified palliative MD.  TOC will continue to follow for needs.                 Expected Discharge Plan: Home w Hospice Care Barriers to Discharge: Continued Medical Work up   Patient Goals and CMS Choice Patient states their goals for this hospitalization and ongoing recovery are:: return home w/hospice services CMS Medicare.gov Compare Post Acute Care list provided to:: Patient Represenative (must comment) (Daughter: [name] [phone]) Choice offered to / list presented to : Adult Children [place] ownership interest in [facility].provided to:: Adult Children    Expected Discharge Plan and Services In-house Referral: Hospice / Palliative Care Discharge Planning Services: CM Consult Post Acute Care Choice: Hospice Living arrangements for the past 2 months: Single Family Home                 DME Arranged: N/A DME Agency: NA       HH Arranged: NA          Prior Living Arrangements/Services Living arrangements for the past 2 months: Single Family Home Lives with:: Adult Children Patient language and need for interpreter reviewed:: Yes Do you feel safe going back to the place where you live?: Yes      Need for Family Participation in Patient Care: No (Comment)   Current home  services: DME (cane, walker) Criminal Activity/Legal Involvement Pertinent to Current Situation/Hospitalization: No - Comment as needed  Activities of Daily Living Home Assistive Devices/Equipment: None ADL Screening (condition at time of admission) Patient's cognitive ability adequate to safely complete daily activities?: No Is the patient deaf or have difficulty hearing?: Yes Does the patient have difficulty seeing, even when wearing glasses/contacts?: Yes Does the patient have difficulty concentrating, remembering, or making decisions?: Yes Patient able to express need for assistance with ADLs?: Yes Does the patient have difficulty dressing or bathing?: Yes Independently performs ADLs?: No Communication: Needs assistance Is this a change from baseline?: Pre-admission baseline Dressing (OT): Dependent Is this a change from baseline?: Pre-admission baseline Grooming: Needs assistance Is this a change from baseline?: Pre-admission baseline Feeding: Needs assistance Is this a change from baseline?: Pre-admission baseline Bathing: Needs assistance Is this a change from baseline?: Pre-admission baseline Toileting: Needs assistance Is this a change from baseline?: Pre-admission baseline In/Out Bed: Needs assistance Is this a change from baseline?: Pre-admission baseline Walks in Home: Needs assistance Is this a change from baseline?: Pre-admission baseline Does the patient have difficulty walking or climbing stairs?: Yes Weakness of Legs: Both Weakness of Arms/Hands: Both  Permission Sought/Granted Permission sought to share information with : Case Manager Permission granted to share information with : Yes, Verbal Permission Granted  Share Information with NAME: Case manager           Emotional Assessment Appearance:: Appears stated age Attitude/Demeanor/Rapport: Unable to  Assess Affect (typically observed): Unable to Assess Orientation: : Oriented to Self, Oriented to  Place Alcohol / Substance Use: Not Applicable Psych Involvement: No (comment)  Admission diagnosis:  Severe sepsis (HCC) [A41.9, R65.20] Sepsis, due to unspecified organism, unspecified whether acute organ dysfunction present [facility]) [A41.9] Patient Active Problem List   Diagnosis Date Noted   Severe sepsis (HCC) 03/29/2023   Acute on chronic diastolic CHF (congestive heart failure) (HCC) 12/20/2022   Goals of care, counseling/discussion 12/20/2022   DNR (do not resuscitate) 12/20/2022   Palliative care encounter 12/20/2022   Pneumonia due to COVID-19 virus 12/19/2022   Enterococcus UTI 08/13/2022   Paroxysmal atrial fibrillation (HCC) 08/13/2022   Generalized weakness 08/13/2022   Urinary tract infection without hematuria 08/13/2022   Protein-calorie malnutrition, severe 08/12/2022   History of pulmonary embolism 08/10/2022   Moderate dementia without behavioral disturbance, psychotic disturbance, mood disturbance, or anxiety (HCC) 11/05/2021   Tachycardia 10/13/2021   Upper airway cough syndrome 07/10/2017   Dyspnea on exertion 05/14/2015   Anxiety state 06/26/2014   Chronic kidney disease, stage 3b (HCC) 06/26/2014   HLD (hyperlipidemia) 06/26/2014   Acid reflux 06/26/2014   Benign hypertensive kidney disease 06/26/2014   OP (osteoporosis) 06/26/2014   Peripheral neuralgia 06/26/2014   Adult hypothyroidism 06/26/2014   Arthralgia of hip or thigh 02/09/2014   GERD 12/06/2007   HYPERLIPIDEMIA 11/15/2007   Essential hypertension 11/15/2007   PCP:  [name], MD Pharmacy:   [id number] - [place], [place] - [address] [place] [address] Phone: [phone] Fax: [phone]     Social Determinants of Health (SDOH) Social History: SDOH Screenings   Food Insecurity: No Food Insecurity (03/29/2023)  Housing: Low Risk  (03/29/2023)  Transportation Needs: No Transportation Needs (03/29/2023)  Utilities: Not At Risk (03/29/2023)  Tobacco  Use: Low Risk  (03/28/2023)   SDOH Interventions:     Readmission Risk Interventions    03/31/2023    2:19 PM  Readmission Risk Prevention Plan  Transportation Screening Complete  PCP or Specialist Appt within 3-5 Days Complete  HRI or Home Care Consult Complete  Social Work Consult for Recovery Care Planning/Counseling Complete  Palliative Care Screening Complete  Medication Review Oceanographer) Complete

## 2023-04-01 DIAGNOSIS — R6520 Severe sepsis without septic shock: Secondary | ICD-10-CM | POA: Diagnosis not present

## 2023-04-01 DIAGNOSIS — A419 Sepsis, unspecified organism: Secondary | ICD-10-CM | POA: Diagnosis not present

## 2023-04-01 LAB — BRAIN NATRIURETIC PEPTIDE: B Natriuretic Peptide: 592.6 pg/mL — ABNORMAL HIGH (ref 0.0–100.0)

## 2023-04-01 LAB — CBC
HCT: 29.8 % — ABNORMAL LOW (ref 36.0–46.0)
Hemoglobin: 9.2 g/dL — ABNORMAL LOW (ref 12.0–15.0)
MCH: 31.5 pg (ref 26.0–34.0)
MCHC: 30.9 g/dL (ref 30.0–36.0)
MCV: 102.1 fL — ABNORMAL HIGH (ref 80.0–100.0)
Platelets: 228 10*3/uL (ref 150–400)
RBC: 2.92 MIL/uL — ABNORMAL LOW (ref 3.87–5.11)
RDW: 14.7 % (ref 11.5–15.5)
WBC: 7 10*3/uL (ref 4.0–10.5)
nRBC: 0 % (ref 0.0–0.2)

## 2023-04-01 LAB — BASIC METABOLIC PANEL
Anion gap: 9 (ref 5–15)
BUN: 26 mg/dL — ABNORMAL HIGH (ref 8–23)
CO2: 22 mmol/L (ref 22–32)
Calcium: 6.7 mg/dL — ABNORMAL LOW (ref 8.9–10.3)
Chloride: 106 mmol/L (ref 98–111)
Creatinine, Ser: 1.1 mg/dL — ABNORMAL HIGH (ref 0.44–1.00)
GFR, Estimated: 46 mL/min — ABNORMAL LOW (ref >60)
Glucose, Bld: 105 mg/dL — ABNORMAL HIGH (ref 70–99)
Potassium: 4.1 mmol/L (ref 3.5–5.1)
Sodium: 137 mmol/L (ref 135–145)

## 2023-04-01 LAB — PROCALCITONIN: Procalcitonin: <0.1 ng/mL

## 2023-04-01 LAB — URINE CULTURE: Culture: >=100000 — AB

## 2023-04-01 LAB — MAGNESIUM: Magnesium: 2.3 mg/dL (ref 1.7–2.4)

## 2023-04-01 LAB — LACTIC ACID, PLASMA: Lactic Acid, Venous: 2.8 mmol/L (ref 0.5–1.9)

## 2023-04-01 MED ORDER — SODIUM CHLORIDE 0.9 % IV SOLN: 3.0000 g | Freq: Two times a day (BID) | INTRAVENOUS | Status: DC

## 2023-04-01 NOTE — Progress Notes (Signed)
Patient picked up by [facility], en route to [facility]. Family at bedside. Vital signs stable, no apparent distress. All belongings returned.

## 2023-04-01 NOTE — Progress Notes (Signed)
Daily Progress Note   [name]       Date: 04/01/2023 DOB: 1925-03-07  Age: 89 y.o. MRN#: [id number] Attending Physician: Alwyn Ren, MD Primary Care Physician: [name], MD Admit Date: 03/28/2023  Reason for Consultation/Follow-up: Establishing goals of care  Subjective: Chart reviewed, overnight events noted.  Patient noted to be agitated episodically.  Oral intake remains minimal daughter [name] at bedside, goals of care discussions undertaken-see below.  Length of Stay: 3  Current Medications: Scheduled Meds:   apixaban  2.5 mg Oral BID   atorvastatin  40 mg Oral Daily   Chlorhexidine Gluconate Cloth  6 each Topical Q0600   donepezil  10 mg Oral Daily   DULoxetine  60 mg Oral Daily   furosemide  40 mg Intravenous Daily   levothyroxine  112 mcg Oral Q0600   melatonin  5 mg Oral QHS   metoprolol tartrate  5 mg Intravenous Once   metoprolol tartrate  75 mg Oral BID   pantoprazole  40 mg Oral Daily    Continuous Infusions:  ampicillin-sulbactam (UNASYN) IV      PRN Meds: acetaminophen, haloperidol lactate, ipratropium-albuterol, polyvinyl alcohol, prochlorperazine  Physical Exam         Frail appearing elderly lady Safety mittens on Monitor noted Appears with generalized weakness Appears chronically ill  Vital Signs: BP (!) 170/66 Comment: Scheduled meds due  Pulse 79   Temp 98.2 F (36.8 C) (Axillary)   Resp 17   Ht 5\' 1"  (1.549 m)   Wt 65.2 kg   SpO2 98%   BMI 27.16 kg/m  SpO2: SpO2: 98 % O2 Device: O2 Device: High Flow Nasal Cannula, NRB O2 Flow Rate: O2 Flow Rate (L/min): 12 L/min  Intake/output summary:  Intake/Output Summary (Last 24 hours) at 04/01/2023 1144 Last data filed at 04/01/2023 1048 Gross per 24 hour   Intake 329.26 ml  Output 1650 ml  Net -1320.74 ml   LBM: Last BM Date : 04/01/23 Baseline Weight: Weight: 62.7 kg Most recent weight: Weight: 65.2 kg       Palliative Assessment/Data:      Patient Active Problem List   Diagnosis Date Noted   Severe sepsis (HCC) 03/29/2023   Acute on chronic diastolic CHF (congestive heart failure) (HCC) 12/20/2022   Goals of care, counseling/discussion  12/20/2022   DNR (do not resuscitate) 12/20/2022   Palliative care encounter 12/20/2022   Pneumonia due to COVID-19 virus 12/19/2022   Enterococcus UTI 08/13/2022   Paroxysmal atrial fibrillation (HCC) 08/13/2022   Generalized weakness 08/13/2022   Urinary tract infection without hematuria 08/13/2022   Protein-calorie malnutrition, severe 08/12/2022   History of pulmonary embolism 08/10/2022   Moderate dementia without behavioral disturbance, psychotic disturbance, mood disturbance, or anxiety (HCC) 11/05/2021   Tachycardia 10/13/2021   Upper airway cough syndrome 07/10/2017   Dyspnea on exertion 05/14/2015   Anxiety state 06/26/2014   Chronic kidney disease, stage 3b (HCC) 06/26/2014   HLD (hyperlipidemia) 06/26/2014   Acid reflux 06/26/2014   Benign hypertensive kidney disease 06/26/2014   OP (osteoporosis) 06/26/2014   Peripheral neuralgia 06/26/2014   Adult hypothyroidism 06/26/2014   Arthralgia of hip or thigh 02/09/2014   GERD 12/06/2007   HYPERLIPIDEMIA 11/15/2007   Essential hypertension 11/15/2007    Palliative Care Assessment & Plan   Patient Profile:  89 year old lady who lives at home with her daughter along with caregiver support.  Patient was seen and evaluated by palliative services in March 2024 when she was admitted with a COVID infection.  At that time, goals of care discussions were undertaken and the concept of exploring home with hospice services was brought up. Patient has a past medical history of paroxysmal atrial fibrillation, she is on Eliquis, history of  hypertension dyslipidemia hypothyroidism stage IIIb chronic kidney disease anxiety depression and dementia. Patient has been admitted with the confusion different than her typical baseline from underlying dementia, was a found to have severe sepsis urinary tract infection present on admission, was found to have community-acquired pneumonia and lactic acidosis. Has a past medical history of paroxysmal atrial fibrillation, she is on Eliquis, history of hypertension dyslipidemia hypothyroidism stage IIIb chronic kidney disease anxiety depression and dementia. Patient has been admitted with the confusion different than her typical baseline from underlying dementia, was a found to have severe sepsis urinary tract infection present on admission, was found to have community-acquired pneumonia and lactic acidosis.  Assessment:  Overall, patient continues with gradual progressive ongoing functional status decline, diminishing oral intake and palliative consult has since been requested for ongoing goals of care discussions.   Recommendations/Plan: Goals of care discussions undertaken with the patient's daughter who was present at the bedside. Discussed with the daughter about patient's current condition and attempted to understand patient's goals wishes and values.  Overall, patient's daughter is seeking hospice care.  We did compared and contrasted home with hospice versus residential hospice.  Patient is having escalating symptom burden and declining oral intake.  At this time, decision has been made to proceed with requesting residential hospice evaluation.  TOC notified.   Code Status:    Code Status Orders  (From admission, onward)           Start     Ordered   03/29/23 0221  Do not attempt resuscitation (DNR)  Continuous       Question Answer Comment  If patient has no pulse and is not breathing Do Not Attempt Resuscitation   If patient has a pulse and/or is breathing: Medical Treatment Goals  LIMITED ADDITIONAL INTERVENTIONS: Use medication/IV fluids and cardiac monitoring as indicated; Do not use intubation or mechanical ventilation (DNI), also provide comfort medications.  Transfer to Progressive/Stepdown as indicated, avoid Intensive Care.   Consent: Discussion documented in EHR or advanced directives reviewed      03/29/23 0220  Code Status History     Date Active Date Inactive Code Status Order ID Comments User Context   12/20/2022 1000 12/23/2022 2029 DNR [id number]  Almon Hercules, MD Inpatient   12/19/2022 2341 12/20/2022 1000 Full Code [id number]  Lurline Del, MD ED   08/10/2022 1751 08/20/2022 2212 Full Code [id number]  Lorin Glass, MD Inpatient       Prognosis:  < 2 weeks  Discharge Planning: Hospice facility  Care plan was discussed with  daughter and [facility]  Thank you for allowing the Palliative Medicine Team to assist in the care of this patient. Mod MDM     Greater than 50%  of this time was spent counseling and coordinating care related to the above assessment and plan.  Rosalin Hawking, MD  Please contact Palliative Medicine Team phone at [phone] for questions and concerns.

## 2023-04-01 NOTE — TOC Progression Note (Addendum)
Transition of Care [facility]) - Progression Note    Patient Details  Name: Erin Ryan MRN: [id number] Date of Birth: 26-Aug-1925  Transition of Care [facility]) CM/SW Contact  Lavenia Atlas, RN Phone Number: 04/01/2023, 11:58 AM  Clinical Narrative:   Spoke with patient's daughter [name] to follow up on her choice for hospice services. [name] has chosen [facility] for residential hospice.  This RNCM made residential hospice referral to with Revonda Standard w/[facility], Revonda Standard will review.  TOC following for discharge needs.    - 2:00pm Received secure chat from [place] w/ [facility] awaiting family to complete paperwork for acceptance to [facility]. Per chart review report can be called to [phone], will need signed DNR. Notified MD, RN. Awaiting response from [facility] w/[facility], discharge summary.  TOC will continue to follow.    Expected Discharge Plan: Home w Hospice Care Barriers to Discharge: Continued Medical Work up  Expected Discharge Plan and Services In-house Referral: Hospice / Palliative Care Discharge Planning Services: CM Consult Post Acute Care Choice: Hospice Living arrangements for the past 2 months: Single Family Home                 DME Arranged: N/A DME Agency: NA       HH Arranged: NA           Social Determinants of Health (SDOH) Interventions SDOH Screenings   Food Insecurity: No Food Insecurity (03/29/2023)  Housing: Low Risk  (03/29/2023)  Transportation Needs: No Transportation Needs (03/29/2023)  Utilities: Not At Risk (03/29/2023)  Tobacco Use: Low Risk  (03/28/2023)    Readmission Risk Interventions    03/31/2023    2:19 PM  Readmission Risk Prevention Plan  Transportation Screening Complete  PCP or Specialist Appt within 3-5 Days Complete  HRI or Home Care Consult Complete  Social Work Consult for Recovery Care Planning/Counseling Complete  Palliative Care Screening Complete  Medication Review Oceanographer) Complete

## 2023-04-01 NOTE — TOC Transition Note (Addendum)
Transition of Care [facility]) - CM/SW Discharge Note   Patient Details  Name: Erin Ryan MRN: [id number] Date of Birth: 26-Apr-1925  Transition of Care [facility]) CM/SW Contact:  Lavenia Atlas, RN Phone Number: 04/01/2023, 4:27 PM   Clinical Narrative:   Spoke with patient's daughter [name] at bedside to advise once paperwork is completed [facility] will be called for transport to [facility]. [name] is very tearful reports she is having difficulty completing the paperwork out on her phone. This RNCM contacted Shanita w/[facility] who reports [employer] will call patient to walk her through it. This RNCM showed [name] how to use phone and review her emails at the same time. Patient's granddaughter has arrived and will help [name] complete paperwork. Will await hospice paperwork completion,    - 4:35pm Shanita with [facility] advised hospice paperwork is completed. Will call [facility] for transport to [facility] place, [facility] folder is at nursing secretary's desk w/chart. Patient's daughter [name] has been notified.   NO additional TOC needs at this time.    Final next level of care: Hospice Medical Facility Barriers to Discharge: Barriers Resolved   Patient Goals and CMS Choice CMS Medicare.gov Compare Post Acute Care list provided to:: Patient Represenative (must comment) [name] (daughter)) Choice offered to / list presented to : Adult Children  Discharge Placement                    Name of family member notified: [name] Patient and family notified of of transfer: 04/01/23  Discharge Plan and Services Additional resources added to the After Visit Summary for   In-house Referral: Hospice / Palliative Care Discharge Planning Services: CM Consult Post Acute Care Choice: Hospice          DME Arranged: N/A DME Agency: NA       HH Arranged: NA          Social Determinants of Health (SDOH) Interventions SDOH Screenings   Food Insecurity: No Food Insecurity (03/29/2023)  Housing: Low Risk   (03/29/2023)  Transportation Needs: No Transportation Needs (03/29/2023)  Utilities: Not At Risk (03/29/2023)  Tobacco Use: Low Risk  (03/28/2023)     Readmission Risk Interventions    03/31/2023    2:19 PM  Readmission Risk Prevention Plan  Transportation Screening Complete  PCP or Specialist Appt within 3-5 Days Complete  HRI or Home Care Consult Complete  Social Work Consult for Recovery Care Planning/Counseling Complete  Palliative Care Screening Complete  Medication Review Oceanographer) Complete

## 2023-04-01 NOTE — Progress Notes (Signed)
Pt was awakened around 0315 to retrieve labs by phlebotomy. Pt immediately became agitated, and also became verbally and physically abusive towards staff including this nurse. This nurse attempted to verbally console pt but my efforts failed. Safety mitts was placed on pt.

## 2023-04-01 NOTE — Progress Notes (Signed)
Civil engineer, contracting [facility]) Hospital Liaison Note  Referral received for patient/family interest in [facility]. Chart under review by [facility] physician.   Bed offered and accepted for transport today. Unit RN please call report to [phone] prior to patient leaving the unit. Please send signed DNR and paperwork with patient.   Please leave all IV access and ports in place.   Please call with any questions or concerns. Thank you  Dionicio Stall, [facility] Liaison [phone]

## 2023-04-01 NOTE — Discharge Summary (Signed)
Physician Discharge Summary  Erin Ryan NWG:[id number] DOB: Apr 07, 1925 DOA: 03/28/2023  PCP: [name], MD  Admit date: 03/28/2023 Discharge date: 04/01/2023  Admitted From: HOME Disposition:[facility] Recommendations for Outpatient Follow-up: NONE Home Health:NONE Equipment/Devices NONE Discharge Condition:GUARDED CODE STATUS DNR Diet recommendation:REGULAR Brief/Interim Summary: 89 y.o. female with past medical history significant for paroxysmal atrial fibrillation on Eliquis, HTN, HLD, hypothyroidism, CKD stage IIIb, anxiety/depression, dementia who presented to [facility] ED on 7/6 via EMS from home with confusion.  Confusion is different than her typical baseline from underlying dementia.  Daughter also reports abdominal pain, diarrhea.  Recently treated for urinary tract infection.  Daughter concerned about possible viral illness.   In the ED, temperature 102.4 F, HR 108, RR 28, BP 144/103, SpO2 96% on room air.  WBC 10.2, hemoglobin 12.2, platelets 301.  Sodium 140, potassium 3.8, chloride 103, CO2 22, glucose 153, BUN 39, creatinine 1.46.  AST 34, ALT 25, total bilirubin 0.5.  Lactic acid 4.3.  Urinalysis with moderate leukocytes, negative nitrite, many bacteria, greater than 50 WBCs.  C. difficile PCR negative.  Chest x-ray with airspace disease mid to lower left lung consistent with edema versus infiltrate, small left pleural effusion.  CT abdomen/pelvis with contrast with no acute localizing process in the abdomen/pelvis, small bilateral pleural effusions with bibasilar atelectasis, colonic/small bowel diverticulosis without evidence of diverticulitis, stable left renal atrophy, aortic atherosclerosis.  Patient was given IV fluid bolus, started on empiric antibiotics by EDP.  TRH consulted for admission for further evaluation management of severe sepsis secondary to pneumonia versus UTI.   Discharge Diagnoses:  Principal Problem:   Severe sepsis (HCC)    Severe sepsis,  POA/urinary tract infection/community-acquired pneumonia-patient admitted with fever tachycardia tachypnea lactic acidosis and leukocytosis.  Procalcitonin was 0.16.  Chest x-ray showed left infiltrates concerning for pneumonia.  She was treated with Unasyn. Discussed with patient's daughter.  Appreciate palliative care.  [facility] consulted for residential hospice.WILL [facility].   Diarrhea 2/2 norovirus Daughter reports patient with episode of diarrhea prior to admission.  No recent antibiotic exposure.  C. difficile PCR negative.  GI PCR panel positive for norovirus.   Hypokalemia resolved   Bladder outlet obstruction Patient continues inability to void, s/p several In-N-Out catheterizations. -- Placed Foley catheter   Essential hypertension Home medications include amlodipine 5 mg p.o. daily, furosemide 40 mg p.o. daily, metoprolol tartrate 75 mg p.o. twice daily.  Holding furosemide and Norvasc.  Metoprolol titrate 75 mg twice a day.   Paroxysmal atrial fibrillation -- Metoprolol tartrate 75 mg p.o. twice daily -- Continue Eliquis 2.5 mg p.o. twice daily   Hyperlipidemia -- atorvastatin 40 mg p.o. daily for now   Hypothyroidism -- Levothyroxine 112 mcg p.o. daily   CKD stage IIIb -- Cr 1.46>1.20>0.89, stable  Estimated body mass index is 27.16 kg/m as calculated from the following:   Height as of this encounter: 5\' 1"  (1.549 m).   Weight as of this encounter: 65.2 kg.  Discharge Instructions  Discharge Instructions     Diet - low sodium heart healthy   Complete by: As directed    Increase activity slowly   Complete by: As directed       Allergies as of 04/01/2023       Reactions   Ambrosia Trifida (tall Ragweed) Allergy Skin Test Other (See Comments)   Hydrochlorothiazide Other (See Comments)   hyponatremia   Lyrica [pregabalin] Other (See Comments)   Severe confusion   Diltiazem Hcl Er  Rash   Short Ragweed Pollen Ext Other (See Comments)         Medication List     STOP taking these medications    amLODipine 2.5 MG tablet Commonly known as: NORVASC   amLODipine 5 MG tablet Commonly known as: NORVASC   apixaban 2.5 MG Tabs tablet Commonly known as: ELIQUIS   atorvastatin 40 MG tablet Commonly known as: LIPITOR   CALCIUM CITRATE + PO   CENTRUM SILVER PO   donepezil 10 MG tablet Commonly known as: ARICEPT   DULoxetine 60 MG capsule Commonly known as: CYMBALTA   furosemide 20 MG tablet Commonly known as: LASIX   gabapentin 100 MG capsule Commonly known as: NEURONTIN   lactose free nutrition Liqd   Metoprolol Tartrate 75 MG Tabs   mirtazapine 15 MG tablet Commonly known as: REMERON   mirtazapine 7.5 MG tablet Commonly known as: REMERON   omeprazole 40 MG capsule Commonly known as: PRILOSEC   polyvinyl alcohol 1.4 % ophthalmic solution Commonly known as: LIQUIFILM TEARS   Prolia 60 MG/ML Sosy injection Generic drug: denosumab   TYLENOL EXTRA STRENGTH PO       TAKE these medications    levothyroxine 112 MCG tablet Commonly known as: SYNTHROID Take 112 mcg by mouth daily before breakfast. What changed: Another medication with the same name was removed. Continue taking this medication, and follow the directions you see here.        Follow-up Information     [facility] Follow up.   Specialty: Hospice and Palliative Medicine Why: RE: residential hospice services Contact information: [address] [place] [address] [phone]               Allergies  Allergen Reactions   Ambrosia Trifida (Tall Ragweed) Allergy Skin Test Other (See Comments)   Hydrochlorothiazide Other (See Comments)    hyponatremia   Lyrica [Pregabalin] Other (See Comments)    Severe confusion   Diltiazem Hcl Er Rash   Short Ragweed Pollen Ext Other (See Comments)    Consultations:palliative   Procedures/Studies: DG CHEST PORT 1 VIEW  Result Date: 03/31/2023 CLINICAL DATA:   Shortness of breath.  History of hypertension. EXAM: PORTABLE CHEST 1 VIEW COMPARISON:  03/28/2023 FINDINGS: Mild cardiomegaly. Aortic atherosclerosis. Pulmonary edema is present, with probable layering pleural effusions. Findings consistent with congestive heart failure/fluid overload. IMPRESSION: Congestive heart failure/fluid overload. Electronically Signed   By: Paulina Fusi M.D.   On: 03/31/2023 12:27   CT ABDOMEN PELVIS W CONTRAST  Result Date: 03/29/2023 CLINICAL DATA:  Abdominal pain EXAM: CT ABDOMEN AND PELVIS WITH CONTRAST TECHNIQUE: Multidetector CT imaging of the abdomen and pelvis was performed using the standard protocol following bolus administration of intravenous contrast. RADIATION DOSE REDUCTION: This exam was performed according to the departmental dose-optimization program which includes automated exposure control, adjustment of the mA and/or kV according to patient size and/or use of iterative reconstruction technique. CONTRAST:  80mL OMNIPAQUE IOHEXOL 300 MG/ML  SOLN COMPARISON:  CT chest abdomen and pelvis 03/01/2022 FINDINGS: Lower chest: There are small bilateral pleural effusions with bibasilar atelectasis. Hepatobiliary: There is an unchanged cyst in the left lobe of the liver measuring 2 cm. Gallbladder and bile ducts are within normal limits. Pancreas: Unremarkable. No pancreatic ductal dilatation or surrounding inflammatory changes. Spleen: Normal in size without focal abnormality. Adrenals/Urinary Tract: Left renal atrophy is unchanged. Right kidney within normal limits. Adrenal glands are within normal limits. Small posterior right bladder diverticulum is unchanged. No bladder calculi. Stomach/Bowel:  There is a small hiatal hernia. Stomach is within normal limits. Appendix appears normal. No evidence of bowel wall thickening, distention, or inflammatory changes. There is sigmoid colon diverticulosis. There also scattered small bowel diverticula in the lower abdomen.  Vascular/Lymphatic: Aortic atherosclerosis. No enlarged abdominal or pelvic lymph nodes. Reproductive: Uterus and bilateral adnexa are unremarkable. Other: No abdominal wall hernia or abnormality. No abdominopelvic ascites. Musculoskeletal: Severe degenerative changes affect the spine. IMPRESSION: 1. No acute localizing process in the abdomen or pelvis. 2. Small bilateral pleural effusions with bibasilar atelectasis. 3. Colonic and small bowel diverticulosis without evidence for diverticulitis. 4. Stable left renal atrophy. Aortic Atherosclerosis (ICD10-I70.0). Electronically Signed   By: Darliss Cheney M.D.   On: 03/29/2023 00:24   DG Chest Portable 1 View  Result Date: 03/28/2023 CLINICAL DATA:  Fever, altered mental status. EXAM: PORTABLE CHEST 1 VIEW COMPARISON:  12/19/2022. FINDINGS: The heart is enlarged and the mediastinal contour is within normal limits. There is atherosclerotic calcification of the aorta. Lung volumes are low. There is a small left pleural effusion with airspace in the mid to lower left lung field. No pneumothorax. No acute osseous abnormality. IMPRESSION: 1. Airspace disease in the mid to lower left lung, possible edema or infiltrate. 2. Small left pleural effusion. Electronically Signed   By: Thornell Sartorius M.D.   On: 03/28/2023 22:30   (Echo, Carotid, EGD, Colonoscopy, ERCP)    Subjective:  Had rough nite now on NRB Discharge Exam: Vitals:   04/01/23 1600 04/01/23 1601  BP: (!) 159/64 (!) 159/64  Pulse: 81 78  Resp: 17 20  Temp:    SpO2: 97% 97%   Vitals:   04/01/23 1546 04/01/23 1559 04/01/23 1600 04/01/23 1601  BP:  (!) 152/57 (!) 159/64 (!) 159/64  Pulse:  80 81 78  Resp:  19 17 20   Temp: 98 F (36.7 C)     TempSrc: Oral     SpO2:  92% 97% 97%  Weight:      Height:        General: Pt is resting ill appearing not responding to commands  Cardiovascular: RRR, S1/S2 +, no rubs, no gallops Respiratory: CTA bilaterally, no wheezing, no rhonchi Abdominal:  Soft, NT, ND, bowel sounds + Extremities:  1 plus edema, no cyanosis    The results of significant diagnostics from this hospitalization (including imaging, microbiology, ancillary and laboratory) are listed below for reference.     Microbiology: Recent Results (from the past 240 hour(s))  Blood culture (routine x 2)     Status: None (Preliminary result)   Collection Time: 03/28/23  9:18 PM   Specimen: BLOOD RIGHT FOREARM  Result Value Ref Range Status   Specimen Description   Final    BLOOD RIGHT FOREARM Performed at [facility], [address], [place], [place] [address]    Special Requests   Final    Blood Culture adequate volume BOTTLES DRAWN AEROBIC AND ANAEROBIC Performed at [facility], [address], [place], [place] [address]    Culture   Final    NO GROWTH 3 DAYS Performed at [facility], [address], [place], [place] [address]    Report Status PENDING  Incomplete  Urine Culture     Status: Abnormal (Preliminary result)   Collection Time: 03/28/23  9:43 PM   Specimen: Urine, Clean Catch  Result Value Ref Range Status   Specimen Description   Final    URINE, CLEAN CATCH Performed at  [facility], [address] [place], [place] [address]    Special Requests   Final    NONE Performed at [facility], [address], [place], [place] [address]    Culture (A)  Final    >=100,000 COLONIES/mL KLEBSIELLA PNEUMONIAE SUSCEPTIBILITIES TO FOLLOW Performed at [facility], [address] [place], [place] [address]    Report Status PENDING  Incomplete  Culture, blood (Routine X 2) w Reflex to ID Panel     Status: None (Preliminary result)   Collection Time: 03/28/23 10:01 PM   Specimen: BLOOD RIGHT FOREARM  Result Value Ref Range Status   Specimen Description   Final    BLOOD RIGHT FOREARM Performed at [facility], [address], [place], [place] [address]    Special Requests    Final    BOTTLES DRAWN AEROBIC AND ANAEROBIC Blood Culture adequate volume Performed at [facility], [address] [place], [place] [address]    Culture   Final    NO GROWTH 3 DAYS Performed at [facility], [address] [place], [place] [address]    Report Status PENDING  Incomplete  MRSA Next Gen by PCR, Nasal     Status: None   Collection Time: 03/29/23  3:05 AM   Specimen: Nasal Mucosa; Nasal Swab  Result Value Ref Range Status   MRSA by PCR Next Gen NOT DETECTED NOT DETECTED Final    Comment: (NOTE) The GeneXpert MRSA Assay (FDA approved for NASAL specimens only), is one component of a comprehensive MRSA colonization surveillance program. It is not intended to diagnose MRSA infection nor to guide or monitor treatment for MRSA infections. Test performance is not FDA approved in patients less than 40 years old. Performed at [facility], [address], [place], [place] [address]   Gastrointestinal Panel by PCR , Stool     Status: Abnormal   Collection Time: 03/29/23  4:54 AM   Specimen: Stool  Result Value Ref Range Status   Campylobacter species NOT DETECTED NOT DETECTED Final   Plesimonas shigelloides NOT DETECTED NOT DETECTED Final   Salmonella species NOT DETECTED NOT DETECTED Final   Yersinia enterocolitica NOT DETECTED NOT DETECTED Final   Vibrio species NOT DETECTED NOT DETECTED Final   Vibrio cholerae NOT DETECTED NOT DETECTED Final   Enteroaggregative E coli (EAEC) NOT DETECTED NOT DETECTED Final   Enteropathogenic E coli (EPEC) NOT DETECTED NOT DETECTED Final   Enterotoxigenic E coli (ETEC) NOT DETECTED NOT DETECTED Final   Shiga like toxin producing E coli (STEC) NOT DETECTED NOT DETECTED Final   Shigella/Enteroinvasive E coli (EIEC) NOT DETECTED NOT DETECTED Final   Cryptosporidium NOT DETECTED NOT DETECTED Final   Cyclospora cayetanensis NOT DETECTED NOT DETECTED Final   Entamoeba histolytica NOT DETECTED NOT  DETECTED Final   Giardia lamblia NOT DETECTED NOT DETECTED Final   Adenovirus F40/41 NOT DETECTED NOT DETECTED Final   Astrovirus NOT DETECTED NOT DETECTED Final   Norovirus GI/GII DETECTED (A) NOT DETECTED Final    Comment: CRITICAL RESULT CALLED TO, READ BACK BY AND VERIFIED WITH: ASHLEY ROBBINS 03/30/23 @ 0735 BY SH    Rotavirus A NOT DETECTED NOT DETECTED Final   Sapovirus (I, II, IV, and V) NOT DETECTED NOT DETECTED Final    Comment: Performed at [facility], [address] [place], [place] [address]  C Difficile Quick Screen w PCR reflex     Status:  None   Collection Time: 03/29/23  4:54 AM   Specimen: Stool  Result Value Ref Range Status   C Diff antigen NEGATIVE NEGATIVE Final   C Diff toxin NEGATIVE NEGATIVE Final   C Diff interpretation No C. difficile detected.  Final    Comment: Performed at [facility], [address] [place], [place] [address]     Labs: BNP (last 3 results) Recent Labs    01/12/23 1549 01/20/23 1259 04/01/23 0324  BNP 232.8* 199.5* 592.6*   Basic Metabolic Panel: Recent Labs  Lab 03/28/23 2118 03/29/23 0500 03/30/23 0244 03/31/23 0313 04/01/23 0324  NA 140 138 135 135 137  K 3.8 3.5 3.0* 4.0 4.1  CL 103 105 105 104 106  CO2 22 23 18* 22 22  GLUCOSE 153* 110* 94 89 105*  BUN 39* 34* 26* 26* 26*  CREATININE 1.46* 1.20* 0.89 0.95 1.10*  CALCIUM 8.2* 7.5* 7.0* 7.0* 6.7*  MG  --  1.5* 2.3 2.4 2.3  PHOS  --  3.5  --  2.6  --    Liver Function Tests: Recent Labs  Lab 03/28/23 2118  AST 34  ALT 25  ALKPHOS 69  BILITOT 0.5  PROT 7.1  ALBUMIN 3.0*   No results for input(s): "LIPASE", "AMYLASE" in the last 168 hours. No results for input(s): "AMMONIA" in the last 168 hours. CBC: Recent Labs  Lab 03/28/23 2118 03/29/23 0500 03/30/23 0244 03/31/23 0313 04/01/23 0324  WBC 10.2 10.2 7.0 8.6 7.0  NEUTROABS 9.3*  --   --   --   --   HGB 12.2 10.1* 9.1* 10.1* 9.2*  HCT 38.7 31.9* 28.8* 32.9* 29.8*   MCV 97.5 100.9* 99.0 100.3* 102.1*  PLT 301 245 205 257 228   Cardiac Enzymes: No results for input(s): "CKTOTAL", "CKMB", "CKMBINDEX", "TROPONINI" in the last 168 hours. BNP: Invalid input(s): "POCBNP" CBG: Recent Labs  Lab 03/29/23 1722  GLUCAP 100*   D-Dimer No results for input(s): "DDIMER" in the last 72 hours. Hgb A1c No results for input(s): "HGBA1C" in the last 72 hours. Lipid Profile No results for input(s): "CHOL", "HDL", "LDLCALC", "TRIG", "CHOLHDL", "LDLDIRECT" in the last 72 hours. Thyroid function studies No results for input(s): "TSH", "T4TOTAL", "T3FREE", "THYROIDAB" in the last 72 hours.  Invalid input(s): "FREET3" Anemia work up No results for input(s): "VITAMINB12", "FOLATE", "FERRITIN", "TIBC", "IRON", "RETICCTPCT" in the last 72 hours. Urinalysis    Component Value Date/Time   COLORURINE YELLOW 03/28/2023 2143   APPEARANCEUR HAZY (A) 03/28/2023 2143   LABSPEC 1.020 03/28/2023 2143   PHURINE 5.0 03/28/2023 2143   GLUCOSEU NEGATIVE 03/28/2023 2143   HGBUR NEGATIVE 03/28/2023 2143   BILIRUBINUR NEGATIVE 03/28/2023 2143   KETONESUR NEGATIVE 03/28/2023 2143   PROTEINUR 30 (A) 03/28/2023 2143   NITRITE NEGATIVE 03/28/2023 2143   LEUKOCYTESUR MODERATE (A) 03/28/2023 2143   Sepsis Labs Recent Labs  Lab 03/29/23 0500 03/30/23 0244 03/31/23 0313 04/01/23 0324  WBC 10.2 7.0 8.6 7.0   Microbiology Recent Results (from the past 240 hour(s))  Blood culture (routine x 2)     Status: None (Preliminary result)   Collection Time: 03/28/23  9:18 PM   Specimen: BLOOD RIGHT FOREARM  Result Value Ref Range Status   Specimen Description   Final    BLOOD RIGHT FOREARM Performed at [facility], [address] [place], [place] [address]    Special Requests   Final    Blood Culture adequate volume BOTTLES DRAWN AEROBIC  AND ANAEROBIC Performed at [facility], [address] [place], [place] [address]    Culture   Final    NO  GROWTH 3 DAYS Performed at [facility], [address], [place], [place] [address]    Report Status PENDING  Incomplete  Urine Culture     Status: Abnormal (Preliminary result)   Collection Time: 03/28/23  9:43 PM   Specimen: Urine, Clean Catch  Result Value Ref Range Status   Specimen Description   Final    URINE, CLEAN CATCH Performed at [facility], [address] [place], [place] [address]    Special Requests   Final    NONE Performed at [facility], [address] [place], [place] [address]    Culture (A)  Final    >=100,000 COLONIES/mL KLEBSIELLA PNEUMONIAE SUSCEPTIBILITIES TO FOLLOW Performed at [facility], [address], [place], [place] [address]    Report Status PENDING  Incomplete  Culture, blood (Routine X 2) w Reflex to ID Panel     Status: None (Preliminary result)   Collection Time: 03/28/23 10:01 PM   Specimen: BLOOD RIGHT FOREARM  Result Value Ref Range Status   Specimen Description   Final    BLOOD RIGHT FOREARM Performed at [facility], [address], [place], [place] [address]    Special Requests   Final    BOTTLES DRAWN AEROBIC AND ANAEROBIC Blood Culture adequate volume Performed at [facility], [address], [place], [place] [address]    Culture   Final    NO GROWTH 3 DAYS Performed at [facility], [address] [place], [place] [address]    Report Status PENDING  Incomplete  MRSA Next Gen by PCR, Nasal     Status: None   Collection Time: 03/29/23  3:05 AM   Specimen: Nasal Mucosa; Nasal Swab  Result Value Ref Range Status   MRSA by PCR Next Gen NOT DETECTED NOT DETECTED Final    Comment: (NOTE) The GeneXpert MRSA Assay (FDA approved for NASAL specimens only), is one component of a comprehensive MRSA colonization surveillance program. It is not intended to diagnose MRSA infection nor to guide or monitor treatment for MRSA infections. Test performance  is not FDA approved in patients less than 59 years old. Performed at [facility], [address], [place], [place] [address]   Gastrointestinal Panel by PCR , Stool     Status: Abnormal   Collection Time: 03/29/23  4:54 AM   Specimen: Stool  Result Value Ref Range Status   Campylobacter species NOT DETECTED NOT DETECTED Final   Plesimonas shigelloides NOT DETECTED NOT DETECTED Final   Salmonella species NOT DETECTED NOT DETECTED Final   Yersinia enterocolitica NOT DETECTED NOT DETECTED Final   Vibrio species NOT DETECTED NOT DETECTED Final   Vibrio cholerae NOT DETECTED NOT DETECTED Final   Enteroaggregative E coli (EAEC) NOT DETECTED NOT DETECTED Final   Enteropathogenic E coli (EPEC) NOT DETECTED NOT DETECTED Final   Enterotoxigenic E coli (ETEC) NOT DETECTED NOT DETECTED Final   Shiga like toxin producing E coli (STEC) NOT DETECTED NOT DETECTED Final   Shigella/Enteroinvasive E coli (EIEC) NOT DETECTED NOT DETECTED Final   Cryptosporidium NOT DETECTED NOT DETECTED Final   Cyclospora cayetanensis NOT DETECTED NOT DETECTED Final   Entamoeba histolytica NOT DETECTED NOT DETECTED Final   Giardia lamblia NOT DETECTED NOT DETECTED Final   Adenovirus  F40/41 NOT DETECTED NOT DETECTED Final   Astrovirus NOT DETECTED NOT DETECTED Final   Norovirus GI/GII DETECTED (A) NOT DETECTED Final    Comment: CRITICAL RESULT CALLED TO, READ BACK BY AND VERIFIED WITH: ASHLEY ROBBINS 03/30/23 @ 0735 BY SH    Rotavirus A NOT DETECTED NOT DETECTED Final   Sapovirus (I, II, IV, and V) NOT DETECTED NOT DETECTED Final    Comment: Performed at [facility], [address], [place], [place] [address]  C Difficile Quick Screen w PCR reflex     Status: None   Collection Time: 03/29/23  4:54 AM   Specimen: Stool  Result Value Ref Range Status   C Diff antigen NEGATIVE NEGATIVE Final   C Diff toxin NEGATIVE NEGATIVE Final   C Diff interpretation No C. difficile detected.  Final     Comment: Performed at [facility], [address] [place] [address], [place], [place] [address]     Time coordinating discharge: 39 minutes  SIGNED  Alwyn Ren, MD  [facility] 04/01/2023, 4:50 PM

## 2023-04-01 NOTE — Progress Notes (Signed)
Physical Therapy Treatment Patient Details Name: Erin Ryan MRN: [id number] DOB: 1925/09/17 Today's Date: 04/01/2023   History of Present Illness 89 y.o. female with past medical history significant for paroxysmal atrial fibrillation on Eliquis, HTN, HLD, hypothyroidism, CKD stage IIIb, anxiety/depression, dementia who presented to [facility] ED on 7/6 via EMS from home with confusion.  Dx of severe sepsis, UTI, lactic acidocis, diarrhea.    PT Comments  +2 total assist for supine to sit, Mod assist of 2 for sit to stand, min assist of 2 to pivot to recliner with RW, SpO2 95% on 13L HFNC.      Assistance Recommended at Discharge Frequent or constant Supervision/Assistance  If plan is discharge home, recommend the following:  Can travel by private vehicle    Two people to help with walking and/or transfers;A lot of help with bathing/dressing/bathroom;Assistance with cooking/housework;Help with stairs or ramp for entrance;Assist for transportation;Direct supervision/assist for medications management;Direct supervision/assist for financial management   No  Equipment Recommendations  None recommended by PT    Recommendations for Other Services       Precautions / Restrictions Precautions Precautions: Fall Precaution Comments: private caregiver reports pt had a fall in the past few days prior to admission Restrictions Weight Bearing Restrictions: No     Mobility  Bed Mobility Overal bed mobility: Needs Assistance Bed Mobility: Supine to Sit     Supine to sit: +2 for physical assistance, Total assist     General bed mobility comments: assist to raise trunk and to pivot hips to edge of bed    Transfers Overall transfer level: Needs assistance Equipment used: Rolling walker (2 wheels) Transfers: Sit to/from Stand, Bed to chair/wheelchair/BSC Sit to Stand: +2 safety/equipment, +2 physical assistance, Mod assist   Step pivot transfers: Min assist, +2  safety/equipment, +2 physical assistance       General transfer comment: assist to power up and steady; shuffling steps to recliner with RW, assist to guide hips to recliner, VCs hand placement. SpO2 95% on 13L HFNC during transfer.    Ambulation/Gait                   Stairs             Wheelchair Mobility     Tilt Bed    Modified Rankin (Stroke Patients Only)       Balance Overall balance assessment: History of Falls, Needs assistance Sitting-balance support: Feet supported, No upper extremity supported Sitting balance-Leahy Scale: Fair   Postural control: Posterior lean Standing balance support: Reliant on assistive device for balance, Bilateral upper extremity supported Standing balance-Leahy Scale: Poor                              Cognition Arousal/Alertness: Awake/alert Behavior During Therapy: WFL for tasks assessed/performed Overall Cognitive Status: History of cognitive impairments - at baseline Area of Impairment: Orientation, Memory, Following commands, Awareness, Safety/judgement, Problem solving, Attention                 Orientation Level: Disoriented to, Place, Time, Situation (not to DOB-year) Current Attention Level: Sustained Memory: Decreased short-term memory (dementia) Following Commands: Follows one step commands inconsistently (Severely HOH) Safety/Judgement: Decreased awareness of safety, Decreased awareness of deficits Awareness: Intellectual Problem Solving: Slow processing, Difficulty sequencing, Requires verbal cues, Requires tactile cues          Exercises      General Comments  Pertinent Vitals/Pain Pain Assessment Pain Assessment: Faces Faces Pain Scale: No hurt    Home Living                          Prior Function            PT Goals (current goals can now be found in the care plan section) Acute Rehab PT Goals Patient Stated Goal: to get stronger PT Goal  Formulation: Patient unable to participate in goal setting Time For Goal Achievement: 04/13/23 Potential to Achieve Goals: Good Progress towards PT goals: Progressing toward goals    Frequency    Min 1X/week      PT Plan Current plan remains appropriate    Co-evaluation              AM-PAC PT "6 Clicks" Mobility   Outcome Measure  Help needed turning from your back to your side while in a flat bed without using bedrails?: A Lot Help needed moving from lying on your back to sitting on the side of a flat bed without using bedrails?: Total Help needed moving to and from a bed to a chair (including a wheelchair)?: Total Help needed standing up from a chair using your arms (e.g., wheelchair or bedside chair)?: Total Help needed to walk in hospital room?: Total Help needed climbing 3-5 steps with a railing? : Total 6 Click Score: 7    End of Session Equipment Utilized During Treatment: Gait belt Activity Tolerance: Patient tolerated treatment well;Patient limited by fatigue Patient left: in chair;with call bell/phone within reach;with family/visitor present;with nursing/sitter in room Nurse Communication: Mobility status PT Visit Diagnosis: History of falling (Z91.81);Difficulty in walking, not elsewhere classified (R26.2)     Time: 1610-9604 PT Time Calculation (min) (ACUTE ONLY): 21 min  Charges:    $Therapeutic Activity: 8-22 mins PT General Charges $$ ACUTE PT VISIT: 1 Visit                     Tamala Ser PT 04/01/2023  Acute Rehabilitation Services  Office [phone]

## 2023-04-01 NOTE — Progress Notes (Addendum)
Report called to [facility] place, report given to Duwayne Heck, Technical brewer). Was informed patient will be going to room 6. Instructed to leave peripheral IV in.

## 2023-04-01 NOTE — Progress Notes (Signed)
PHARMACY NOTE:  ANTIMICROBIAL RENAL DOSAGE ADJUSTMENT  Current antimicrobial regimen includes a mismatch between antimicrobial dosage and estimated renal function.  As per policy approved by the Pharmacy & Therapeutics and Medical Executive Committees, the antimicrobial dosage will be adjusted accordingly.  Current antimicrobial dosage:  Unasyn 3g IV q8h  Indication: CAP, UTI  Renal Function: Estimated Creatinine Clearance: 25.9 mL/min (A) (by C-G formula based on SCr of 1.1 mg/dL (H)).    Antimicrobial dosage has been changed to:  Unasyn 3g IV q12h   Thank you for allowing pharmacy to be a part of this patient's care.  Lynann Beaver PharmD, BCPS [facility] main pharmacy [phone] 04/01/2023 7:49 AM

## 2023-04-01 NOTE — Progress Notes (Addendum)
PROGRESS NOTE    Erin Ryan  UJW:[id number] DOB: 11-12-1924 DOA: 03/28/2023 PCP: [name], MD   Brief Narrative: 89 y.o. female with past medical history significant for paroxysmal atrial fibrillation on Eliquis, HTN, HLD, hypothyroidism, CKD stage IIIb, anxiety/depression, dementia who presented to [facility] ED on 7/6 via EMS from home with confusion.  Confusion is different than her typical baseline from underlying dementia.  Daughter also reports abdominal pain, diarrhea.  Recently treated for urinary tract infection.  Daughter concerned about possible viral illness.   In the ED, temperature 102.4 F, HR 108, RR 28, BP 144/103, SpO2 96% on room air.  WBC 10.2, hemoglobin 12.2, platelets 301.  Sodium 140, potassium 3.8, chloride 103, CO2 22, glucose 153, BUN 39, creatinine 1.46.  AST 34, ALT 25, total bilirubin 0.5.  Lactic acid 4.3.  Urinalysis with moderate leukocytes, negative nitrite, many bacteria, greater than 50 WBCs.  C. difficile PCR negative.  Chest x-ray with airspace disease mid to lower left lung consistent with edema versus infiltrate, small left pleural effusion.  CT abdomen/pelvis with contrast with no acute localizing process in the abdomen/pelvis, small bilateral pleural effusions with bibasilar atelectasis, colonic/small bowel diverticulosis without evidence of diverticulitis, stable left renal atrophy, aortic atherosclerosis.  Patient was given IV fluid bolus, started on empiric antibiotics by EDP.  TRH consulted for admission for further evaluation management of severe sepsis secondary to pneumonia versus UTI.  Assessment & Plan:   Principal Problem:   Severe sepsis (HCC)  Severe sepsis, POA/urinary tract infection/community-acquired pneumonia-patient admitted with fever tachycardia tachypnea lactic acidosis and leukocytosis.  Procalcitonin was 0.16.  Chest x-ray showed left infiltrates concerning for pneumonia.  She was treated with Unasyn. Discussed with  patient's daughter.  Appreciate palliative care.  [facility] consulted for residential hospice.  Diarrhea 2/2 norovirus Daughter reports patient with episode of diarrhea prior to admission.  No recent antibiotic exposure; but daughter reports similar symptoms.  C. difficile PCR negative.  GI PCR panel positive for norovirus.  Hypokalemia Repleted.   Bladder outlet obstruction Patient continues inability to void, s/p several In-N-Out catheterizations. -- Placed Foley catheter   Essential hypertension Home medications include amlodipine 5 mg p.o. daily, furosemide 40 mg p.o. daily, metoprolol tartrate 75 mg p.o. twice daily.  Holding furosemide and Norvasc.  Metoprolol titrate 75 mg twice a day.   Paroxysmal atrial fibrillation -- Metoprolol tartrate 75 mg p.o. twice daily -- Continue Eliquis 2.5 mg p.o. twice daily   Hyperlipidemia -- atorvastatin 40 mg p.o. daily for now  Hypothyroidism -- Levothyroxine 112 mcg p.o. daily   CKD stage IIIb -- Cr 1.46>1.20>0.89, stable -- BMP daily   Dementia-continue supportive measures. Estimated body mass index is 27.16 kg/m as calculated from the following:   Height as of this encounter: 5\' 1"  (1.549 m).   Weight as of this encounter: 65.2 kg.  DVT prophylaxis: eliquis Code Status: dnr Family Communication: dw daughtet Disposition Plan:  Status is: Inpatient Remains inpatient appropriate because: residential hospice   Consultants: palliative  Procedures:none Antimicrobials:  Anti-infectives (From admission, onward)    Start     Dose/Rate Route Frequency Ordered Stop   04/01/23 1800  Ampicillin-Sulbactam (UNASYN) 3 g in sodium chloride 0.9 % 100 mL IVPB        3 g 200 mL/hr over 30 Minutes Intravenous Every 12 hours 04/01/23 0748     03/31/23 0000  vancomycin (VANCOREADY) IVPB 750 mg/150 mL  Status:  Discontinued  750 mg 150 mL/hr over 60 Minutes Intravenous Every 48 hours 03/29/23 0049 03/29/23 1102   03/30/23 0600   Ampicillin-Sulbactam (UNASYN) 3 g in sodium chloride 0.9 % 100 mL IVPB  Status:  Discontinued        3 g 200 mL/hr over 30 Minutes Intravenous Every 8 hours 03/30/23 0457 04/01/23 0748   03/29/23 2200  ceFEPIme (MAXIPIME) 2 g in sodium chloride 0.9 % 100 mL IVPB  Status:  Discontinued        2 g 200 mL/hr over 30 Minutes Intravenous Every 24 hours 03/29/23 0048 03/29/23 1059   03/29/23 2200  Ampicillin-Sulbactam (UNASYN) 3 g in sodium chloride 0.9 % 100 mL IVPB  Status:  Discontinued        3 g 200 mL/hr over 30 Minutes Intravenous Every 12 hours 03/29/23 1103 03/30/23 0457   03/28/23 2215  vancomycin (VANCOREADY) IVPB 1250 mg/250 mL        1,250 mg 166.7 mL/hr over 90 Minutes Intravenous  Once 03/28/23 2123 03/29/23 0114   03/28/23 2130  ceFEPIme (MAXIPIME) 2 g in sodium chloride 0.9 % 100 mL IVPB        2 g 200 mL/hr over 30 Minutes Intravenous  Once 03/28/23 2119 03/28/23 2232        Subjective:  Daughter in room Patient had a rough night now on nonrebreather 12 L  Objective: Vitals:   04/01/23 0828 04/01/23 0900 04/01/23 1000 04/01/23 1145  BP:  127/75 (!) 170/66   Pulse: 71 73 79   Resp: 16 16 17    Temp:    97.8 F (36.6 C)  TempSrc:    Oral  SpO2: 100% 100% 98%   Weight:      Height:        Intake/Output Summary (Last 24 hours) at 04/01/2023 1202 Last data filed at 04/01/2023 1048 Gross per 24 hour  Intake 329.26 ml  Output 1650 ml  Net -1320.74 ml   Filed Weights   03/30/23 0500 03/31/23 0500 04/01/23 0500  Weight: 64.4 kg 65.5 kg 65.2 kg    Examination:  General exam: Appears chronically ill-appearing Respiratory system: Diminished breath sounds Cardiovascular system: Regular Gastrointestinal system: Abdomen is nondistended, soft and nontender. No organomegaly or masses felt. Normal bowel sounds heard. Central nervous system: Alert and oriented. No focal neurological deficits. Extremities: Trace edema     Data Reviewed: I have personally reviewed  following labs and imaging studies  CBC: Recent Labs  Lab 03/28/23 2118 03/29/23 0500 03/30/23 0244 03/31/23 0313 04/01/23 0324  WBC 10.2 10.2 7.0 8.6 7.0  NEUTROABS 9.3*  --   --   --   --   HGB 12.2 10.1* 9.1* 10.1* 9.2*  HCT 38.7 31.9* 28.8* 32.9* 29.8*  MCV 97.5 100.9* 99.0 100.3* 102.1*  PLT 301 245 205 257 228   Basic Metabolic Panel: Recent Labs  Lab 03/28/23 2118 03/29/23 0500 03/30/23 0244 03/31/23 0313 04/01/23 0324  NA 140 138 135 135 137  K 3.8 3.5 3.0* 4.0 4.1  CL 103 105 105 104 106  CO2 22 23 18* 22 22  GLUCOSE 153* 110* 94 89 105*  BUN 39* 34* 26* 26* 26*  CREATININE 1.46* 1.20* 0.89 0.95 1.10*  CALCIUM 8.2* 7.5* 7.0* 7.0* 6.7*  MG  --  1.5* 2.3 2.4 2.3  PHOS  --  3.5  --  2.6  --    GFR: Estimated Creatinine Clearance: 25.9 mL/min (A) (by C-G formula based on SCr of 1.1 mg/dL (  H)). Liver Function Tests: Recent Labs  Lab 03/28/23 2118  AST 34  ALT 25  ALKPHOS 69  BILITOT 0.5  PROT 7.1  ALBUMIN 3.0*   No results for input(s): "LIPASE", "AMYLASE" in the last 168 hours. No results for input(s): "AMMONIA" in the last 168 hours. Coagulation Profile: No results for input(s): "INR", "PROTIME" in the last 168 hours. Cardiac Enzymes: No results for input(s): "CKTOTAL", "CKMB", "CKMBINDEX", "TROPONINI" in the last 168 hours. BNP (last 3 results) No results for input(s): "PROBNP" in the last 8760 hours. HbA1C: No results for input(s): "HGBA1C" in the last 72 hours. CBG: Recent Labs  Lab 03/29/23 1722  GLUCAP 100*   Lipid Profile: No results for input(s): "CHOL", "HDL", "LDLCALC", "TRIG", "CHOLHDL", "LDLDIRECT" in the last 72 hours. Thyroid Function Tests: No results for input(s): "TSH", "T4TOTAL", "FREET4", "T3FREE", "THYROIDAB" in the last 72 hours. Anemia Panel: No results for input(s): "VITAMINB12", "FOLATE", "FERRITIN", "TIBC", "IRON", "RETICCTPCT" in the last 72 hours. Sepsis Labs: Recent Labs  Lab 03/28/23 2116 03/28/23 2118  03/29/23 0454 03/30/23 0244 03/30/23 0528 03/31/23 0313 04/01/23 0324  PROCALCITON 0.16  --   --   --  0.17 0.15 <0.10  LATICACIDVEN  --    < > 1.7 5.3*  --  2.6* 2.8*   < > = values in this interval not displayed.    Recent Results (from the past 240 hour(s))  Blood culture (routine x 2)     Status: None (Preliminary result)   Collection Time: 03/28/23  9:18 PM   Specimen: BLOOD RIGHT FOREARM  Result Value Ref Range Status   Specimen Description   Final    BLOOD RIGHT FOREARM Performed at [facility], [address] [place], [place] [address]    Special Requests   Final    Blood Culture adequate volume BOTTLES DRAWN AEROBIC AND ANAEROBIC Performed at [facility], [address], [place], [place] [address]    Culture   Final    NO GROWTH 3 DAYS Performed at [facility], [address] [place], [place] [address]    Report Status PENDING  Incomplete  Culture, blood (Routine X 2) w Reflex to ID Panel     Status: None (Preliminary result)   Collection Time: 03/28/23 10:01 PM   Specimen: BLOOD RIGHT FOREARM  Result Value Ref Range Status   Specimen Description   Final    BLOOD RIGHT FOREARM Performed at [facility], [address], [place], [place] [address]    Special Requests   Final    BOTTLES DRAWN AEROBIC AND ANAEROBIC Blood Culture adequate volume Performed at [facility], [address] [place], [place] [address]    Culture   Final    NO GROWTH 3 DAYS Performed at [facility], [address] [place], [place] [address]    Report Status PENDING  Incomplete  MRSA Next Gen by PCR, Nasal     Status: None   Collection Time: 03/29/23  3:05 AM   Specimen: Nasal Mucosa; Nasal Swab  Result Value Ref Range Status   MRSA by PCR Next Gen NOT DETECTED NOT DETECTED Final    Comment: (NOTE) The GeneXpert MRSA Assay (FDA approved for NASAL specimens only), is one component of a comprehensive MRSA  colonization surveillance program. It is not intended to diagnose MRSA infection nor to guide or monitor treatment for MRSA infections. Test performance is not FDA approved in patients less than 2  years old. Performed at [facility], [address], [place], [place] [address]   Gastrointestinal Panel by PCR , Stool     Status: Abnormal   Collection Time: 03/29/23  4:54 AM   Specimen: Stool  Result Value Ref Range Status   Campylobacter species NOT DETECTED NOT DETECTED Final   Plesimonas shigelloides NOT DETECTED NOT DETECTED Final   Salmonella species NOT DETECTED NOT DETECTED Final   Yersinia enterocolitica NOT DETECTED NOT DETECTED Final   Vibrio species NOT DETECTED NOT DETECTED Final   Vibrio cholerae NOT DETECTED NOT DETECTED Final   Enteroaggregative E coli (EAEC) NOT DETECTED NOT DETECTED Final   Enteropathogenic E coli (EPEC) NOT DETECTED NOT DETECTED Final   Enterotoxigenic E coli (ETEC) NOT DETECTED NOT DETECTED Final   Shiga like toxin producing E coli (STEC) NOT DETECTED NOT DETECTED Final   Shigella/Enteroinvasive E coli (EIEC) NOT DETECTED NOT DETECTED Final   Cryptosporidium NOT DETECTED NOT DETECTED Final   Cyclospora cayetanensis NOT DETECTED NOT DETECTED Final   Entamoeba histolytica NOT DETECTED NOT DETECTED Final   Giardia lamblia NOT DETECTED NOT DETECTED Final   Adenovirus F40/41 NOT DETECTED NOT DETECTED Final   Astrovirus NOT DETECTED NOT DETECTED Final   Norovirus GI/GII DETECTED (A) NOT DETECTED Final    Comment: CRITICAL RESULT CALLED TO, READ BACK BY AND VERIFIED WITH: ASHLEY ROBBINS 03/30/23 @ 0735 BY SH    Rotavirus A NOT DETECTED NOT DETECTED Final   Sapovirus (I, II, IV, and V) NOT DETECTED NOT DETECTED Final    Comment: Performed at [facility] [address] [place], [place] [address]  C Difficile Quick Screen w PCR reflex     Status: None   Collection Time: 03/29/23  4:54 AM   Specimen: Stool  Result Value Ref  Range Status   C Diff antigen NEGATIVE NEGATIVE Final   C Diff toxin NEGATIVE NEGATIVE Final   C Diff interpretation No C. difficile detected.  Final    Comment: Performed at [facility], [address], [place], [place] [address]     Radiology Studies: DG CHEST PORT 1 VIEW  Result Date: 03/31/2023 CLINICAL DATA:  Shortness of breath.  History of hypertension. EXAM: PORTABLE CHEST 1 VIEW COMPARISON:  03/28/2023 FINDINGS: Mild cardiomegaly. Aortic atherosclerosis. Pulmonary edema is present, with probable layering pleural effusions. Findings consistent with congestive heart failure/fluid overload. IMPRESSION: Congestive heart failure/fluid overload. Electronically Signed   By: Paulina Fusi M.D.   On: 03/31/2023 12:27     Scheduled Meds:  apixaban  2.5 mg Oral BID   atorvastatin  40 mg Oral Daily   Chlorhexidine Gluconate Cloth  6 each Topical Q0600   donepezil  10 mg Oral Daily   DULoxetine  60 mg Oral Daily   furosemide  40 mg Intravenous Daily   levothyroxine  112 mcg Oral Q0600   melatonin  5 mg Oral QHS   metoprolol tartrate  5 mg Intravenous Once   metoprolol tartrate  75 mg Oral BID   pantoprazole  40 mg Oral Daily   Continuous Infusions:  ampicillin-sulbactam (UNASYN) IV       LOS: 3 days   Time spent: 38 min  Alwyn Ren, MD  04/01/2023, 12:02 PM

## 2023-04-02 LAB — URINE CULTURE

## 2023-04-03 LAB — CULTURE, BLOOD (ROUTINE X 2)
Culture: NO GROWTH
Culture: NO GROWTH
Special Requests: ADEQUATE
Special Requests: ADEQUATE

## 2023-04-17 ENCOUNTER — Telehealth: Payer: Self-pay | Admitting: Adult Health

## 2023-04-17 NOTE — Telephone Encounter (Signed)
Daughter reports pt has passed

## 2023-04-23 DEATH — deceased

## 2023-05-05 ENCOUNTER — Ambulatory Visit: Payer: Medicare Other | Attending: Nurse Practitioner | Admitting: Nurse Practitioner

## 2023-05-05 NOTE — Progress Notes (Deleted)
Office Visit    [name] Date of Encounter: 05/05/2023  Primary Care Provider:  [name], MD Primary Cardiologist:  Rollene Rotunda, MD  Chief Complaint    89 year old female with a history of paroxysmal atrial fibrillation, chronic diastolic heart failure, PE, hypertension, hyperlipidemia, CKD stage IIIa, hypothyroidism, COPD, and GERD who presents for follow-up related to heart failure and atrial fibrillation.   Past Medical History    Past Medical History:  Diagnosis Date   A-fib [facility])    Anxiety    Arthritis    COVID-19    04/2021   Dementia (HCC)    GERD (gastroesophageal reflux disease)    Hearing loss    has hearing aids   High cholesterol    Hypertension    Hypothyroid    Osteoporosis    Scoliosis    Thyroid disease    Past Surgical History:  Procedure Laterality Date   CATARACT EXTRACTION     cysts removed from bilateral breasts     THYROIDECTOMY      Allergies  Allergies  Allergen Reactions   Ambrosia Trifida (Tall Ragweed) Allergy Skin Test Other (See Comments)   Hydrochlorothiazide Other (See Comments)    hyponatremia   Lyrica [Pregabalin] Other (See Comments)    Severe confusion   Diltiazem Hcl Er Rash   Short Ragweed Pollen Ext Other (See Comments)     Labs/Other Studies Reviewed    The following studies were reviewed today:  Cardiac Studies & Procedures       ECHOCARDIOGRAM  ECHOCARDIOGRAM COMPLETE 12/20/2022  Narrative ECHOCARDIOGRAM REPORT    Patient Name:   Erin Ryan Date of Exam: 12/20/2022 Medical Rec #:  [id number]     Height:       62.0 in Accession #:    [id number]    Weight:       135.6 lb Date of Birth:  1925/09/02      BSA:          1.621 m Patient Age:    89 years      BP:           137/48 mmHg Patient Gender: F             HR:           66 bpm. Exam Location:  Inpatient  Procedure: 2D Echo, Cardiac Doppler and Color Doppler  Indications:    CHF-Acute Diastolic I50.31  History:         Patient has prior history of Echocardiogram examinations, most recent 08/11/2022. Arrythmias:Atrial Fibrillation and Tachycardia, Signs/Symptoms:Dyspnea; Risk Factors:Hypertension and Dyslipidemia. CKD, stage 3.  Sonographer:    Lucendia Herrlich Referring Phys: [id number] SARA-MAIZ A THOMAS  IMPRESSIONS   1. There is mild chordal SAM in the setting of hyperdynamic LV function, very mild dynamic LVOT peak gradient of 18 mmHg. Marland Kitchen Left ventricular ejection fraction, by estimation, is 70 to 75%. The left ventricle has hyperdynamic function. Left ventricular endocardial border not optimally defined to evaluate regional wall motion. There is mild left ventricular hypertrophy. Left ventricular diastolic parameters are consistent with Grade I diastolic dysfunction (impaired relaxation). Elevated left atrial pressure. 2. Right ventricular systolic function is normal. The right ventricular size is normal. 3. The mitral valve is abnormal. Mild mitral valve regurgitation. No evidence of mitral stenosis. 4. The tricuspid valve is abnormal. 5. The aortic valve is tricuspid. Aortic valve regurgitation is mild. No aortic stenosis is present.  FINDINGS Left Ventricle: There is mild chordal SAM  in the setting of hyperdynamic LV function, very mild dynamic LVOT peak gradient of 18 mmHg. Left ventricular ejection fraction, by estimation, is 70 to 75%. The left ventricle has hyperdynamic function. Left ventricular endocardial border not optimally defined to evaluate regional wall motion. The left ventricular internal cavity size was normal in size. There is mild left ventricular hypertrophy. Left ventricular diastolic parameters are consistent with Grade I diastolic dysfunction (impaired relaxation). Elevated left atrial pressure.  Right Ventricle: Indeterminant PASP, IVC poorly visualized, cannot estimate RA pressure. The right ventricular size is normal. Right vetricular wall thickness was not well visualized.  Right ventricular systolic function is normal.  Left Atrium: Left atrial size was normal in size.  Right Atrium: Right atrial size was normal in size.  Pericardium: There is no evidence of pericardial effusion.  Mitral Valve: The mitral valve is abnormal. Mild to moderate mitral annular calcification. Mild mitral valve regurgitation. No evidence of mitral valve stenosis. MV peak gradient, 6.5 mmHg. The mean mitral valve gradient is 3.0 mmHg.  Tricuspid Valve: The tricuspid valve is abnormal. Tricuspid valve regurgitation is mild . No evidence of tricuspid stenosis.  Aortic Valve: The aortic valve is tricuspid. Aortic valve regurgitation is mild. No aortic stenosis is present. Aortic valve mean gradient measures 4.0 mmHg. Aortic valve peak gradient measures 6.3 mmHg. Aortic valve area, by VTI measures 1.70 cm.  Pulmonic Valve: The pulmonic valve was not well visualized. Pulmonic valve regurgitation is not visualized. No evidence of pulmonic stenosis.  Aorta: The aortic root and ascending aorta are structurally normal, with no evidence of dilitation.  Venous: The inferior vena cava was not well visualized.  IAS/Shunts: No atrial level shunt detected by color flow Doppler.   LEFT VENTRICLE PLAX 2D LVIDd:         2.60 cm   Diastology LVIDs:         1.90 cm   LV e' medial:    3.68 cm/s LV PW:         1.10 cm   LV E/e' medial:  28.3 LV IVS:        1.20 cm   LV e' lateral:   7.62 cm/s LVOT diam:     1.90 cm   LV E/e' lateral: 13.6 LV SV:         48 LV SV Index:   30 LVOT Area:     2.84 cm   RIGHT VENTRICLE RV S prime:     10.70 cm/s TAPSE (M-mode): 1.3 cm  LEFT ATRIUM             Index        RIGHT ATRIUM           Index LA diam:        2.90 cm 1.79 cm/m   RA Area:     11.20 cm LA Vol (A2C):   40.3 ml 24.87 ml/m  RA Volume:   22.90 ml  14.13 ml/m LA Vol (A4C):   41.2 ml 25.42 ml/m LA Biplane Vol: 44.0 ml 27.15 ml/m AORTIC VALVE AV Area (Vmax):    1.79 cm AV Area  (Vmean):   1.68 cm AV Area (VTI):     1.70 cm AV Vmax:           125.50 cm/s AV Vmean:          87.450 cm/s AV VTI:            0.283 m AV Peak Grad:  6.3 mmHg AV Mean Grad:      4.0 mmHg LVOT Vmax:         79.17 cm/s LVOT Vmean:        51.833 cm/s LVOT VTI:          0.170 m LVOT/AV VTI ratio: 0.60  AORTA Ao Root diam: 3.20 cm Ao Asc diam:  2.90 cm  MITRAL VALVE                TRICUSPID VALVE MV Area (PHT): 3.08 cm     TR Peak grad:   28.3 mmHg MV Peak grad:  6.5 mmHg     TR Vmax:        266.00 cm/s MV Mean grad:  3.0 mmHg MV Vmax:       1.27 m/s     SHUNTS MV Vmean:      75.1 cm/s    Systemic VTI:  0.17 m MV Decel Time: 246 msec     Systemic Diam: 1.90 cm MV E velocity: 104.00 cm/s MV A velocity: 114.00 cm/s MV E/A ratio:  0.91  Dina Rich MD Electronically signed by Dina Rich MD Signature Date/Time: 12/20/2022/1:35:52 PM    Final            Recent Labs: 12/20/2022: TSH 0.696 03/28/2023: ALT 25 04/01/2023: B Natriuretic Peptide 592.6; BUN 26; Creatinine, Ser 1.10; Hemoglobin 9.2; Magnesium 2.3; Platelets 228; Potassium 4.1; Sodium 137  Recent Lipid Panel No results found for: "CHOL", "TRIG", "HDL", "CHOLHDL", "VLDL", "LDLCALC", "LDLDIRECT"  History of Present Illness    89 year old female with the above past medical history including paroxysmal atrial fibrillation, chronic diastolic heart failure, PE, hypertension, hyperlipidemia, CKD stage IIIa, hypothyroidism, COPD, and GERD.    She was hospitalized in November 2023 in the setting of heart failure exacerbation, small segmental PE, atrial fibrillation with RVR, and UTI.  Echocardiogram at that time showed EF 55 to 60%, mild LVH, G2 DD, mildly elevated PASP, moderate AI.  CT chest chest showed acute PE with small thrombus burden in segmental branch in the lower lobe.  She was started on amiodarone and Eliquis.  It was noted she would likely discontinue Eliquis after 6 months given patient's dementia and  advanced age, prior, given high risk for thromboembolism, Eliquis was ultimately continued. Amiodarone was discontinued. She was hospitalized from 12/19/2022 to 12/23/2022 in the setting of COVID-19, pneumonia, acute on chronic diastolic heart failure.  Repeat echocardiogram showed EF 70 to 75%, G1 DD.  Diuretics were held due to worsening renal function.  Palliative care was consulted with plans to follow as outpatient.  She was last seen in the office on 01/29/2023 and was stable from a cardiac standpoint.  He was hospitalized in July 2024 in the setting of sepsis due to urinary tract infection/bladder outlet obstruction, community-acquired pneumonia, and norovirus.  Foley catheter was placed. Furosemide and amlodipine were held in the setting of hypotension.  She was discharged to residential hospice.   She presents today for follow-up accompanied by her daughter.  Since her hospitalization she has been     1. Chronic diastolic heart failure: Most recent echo in 11/2022 showed EF 70 to 75%, G1 DD. Lasix was held temporarily in the setting of AKI on CKD, now with 2+ bilateral ankle edema, only slightly improved with Lasix 20 mg daily. Will check BNP, BMET today.  If renal function stable, will increase Lasix to 40 mg daily with plans to repeat BMET in 1 week. Plan for close follow-up.  2. Paroxysmal atrial fibrillation: Maintaining NSR. Will defer discontinuation of Eliquis to Dr. Antoine Poche. For now, continue metoprolol, Eliquis.    3. History of PE: Tentative plan to discontinue Eliquis in May 2024.    4. Hypertension: BP well controlled. Continue current antihypertensive regimen.    5. Hyperlipidemia: LDL was 111 in 06/2022. Continue Lipitor.    6. CKD stage IIIa: Creatinine was 1.34 on 12/23/2022.    7. Hypothyroidism: TSH was 0.696 in 11/2022.    8. Dementia: Progressive, follows with neurology.    9. Goals of care: Palliative care was consulted during recent hospitalization.  Discussed  palliative care, however, patient's daughter states she is "not interested in hospice."  Explained the role of palliative care, that it is not the same as hospice but can help facilitate transition to hospice care if necessary.  Patient's daughter states she will consider this.   10. Disposition: Follow-up as scheduled with Dr. Antoine Poche in 01/2023.     Home Medications    Current Outpatient Medications  Medication Sig Dispense Refill   levothyroxine (SYNTHROID) 112 MCG tablet Take 112 mcg by mouth daily before breakfast.     No current facility-administered medications for this visit.     Review of Systems    ***.  All other systems reviewed and are otherwise negative except as noted above.    Physical Exam    VS:  There were no vitals taken for this visit. , BMI There is no height or weight on file to calculate BMI.     GEN: Well nourished, well developed, in no acute distress. HEENT: normal. Neck: Supple, no JVD, carotid bruits, or masses. Cardiac: RRR, no murmurs, rubs, or gallops. No clubbing, cyanosis, edema.  Radials/DP/PT 2+ and equal bilaterally.  Respiratory:  Respirations regular and unlabored, clear to auscultation bilaterally. GI: Soft, nontender, nondistended, BS + x 4. MS: no deformity or atrophy. Skin: warm and dry, no rash. Neuro:  Strength and sensation are intact. Psych: Normal affect.  Accessory Clinical Findings    ECG personally reviewed by me today -    - no acute changes.   Lab Results  Component Value Date   WBC 7.0 04/01/2023   HGB 9.2 (L) 04/01/2023   HCT 29.8 (L) 04/01/2023   MCV 102.1 (H) 04/01/2023   PLT 228 04/01/2023   Lab Results  Component Value Date   CREATININE 1.10 (H) 04/01/2023   BUN 26 (H) 04/01/2023   NA 137 04/01/2023   K 4.1 04/01/2023   CL 106 04/01/2023   CO2 22 04/01/2023   Lab Results  Component Value Date   ALT 25 03/28/2023   AST 34 03/28/2023   ALKPHOS 69 03/28/2023   BILITOT 0.5 03/28/2023   No results  found for: "CHOL", "HDL", "LDLCALC", "LDLDIRECT", "TRIG", "CHOLHDL"  No results found for: "HGBA1C"  Assessment & Plan    1.  ***  No BP recorded.  {Refresh Note OR Click here to enter BP  :1}***   Joylene Grapes, NP 05/05/2023, 5:46 AM

## 2023-07-23 ENCOUNTER — Ambulatory Visit: Payer: Medicare Other | Admitting: Adult Health
